# Patient Record
Sex: Male | Born: 1935 | Race: White | Hispanic: No | Marital: Married | Smoking: Never smoker
Health system: Southern US, Community
[De-identification: ages and names within clinical notes are randomized; demographics above are authoritative.]

## PROBLEM LIST (undated history)

## (undated) DIAGNOSIS — I5033 Acute on chronic diastolic (congestive) heart failure: Secondary | ICD-10-CM

## (undated) DIAGNOSIS — IMO0001 Reserved for inherently not codable concepts without codable children: Secondary | ICD-10-CM

## (undated) DIAGNOSIS — C61 Malignant neoplasm of prostate: Secondary | ICD-10-CM

## (undated) DIAGNOSIS — K449 Diaphragmatic hernia without obstruction or gangrene: Secondary | ICD-10-CM

## (undated) DIAGNOSIS — IMO0002 Reserved for concepts with insufficient information to code with codable children: Secondary | ICD-10-CM

## (undated) DIAGNOSIS — I10 Essential (primary) hypertension: Secondary | ICD-10-CM

## (undated) DIAGNOSIS — I350 Nonrheumatic aortic (valve) stenosis: Secondary | ICD-10-CM

## (undated) DIAGNOSIS — I4891 Unspecified atrial fibrillation: Secondary | ICD-10-CM

## (undated) DIAGNOSIS — R32 Unspecified urinary incontinence: Secondary | ICD-10-CM

## (undated) DIAGNOSIS — C799 Secondary malignant neoplasm of unspecified site: Secondary | ICD-10-CM

## (undated) DIAGNOSIS — Z952 Presence of prosthetic heart valve: Secondary | ICD-10-CM

## (undated) DIAGNOSIS — Z923 Personal history of irradiation: Secondary | ICD-10-CM

## (undated) DIAGNOSIS — K222 Esophageal obstruction: Secondary | ICD-10-CM

## (undated) DIAGNOSIS — K5790 Diverticulosis of intestine, part unspecified, without perforation or abscess without bleeding: Secondary | ICD-10-CM

## (undated) DIAGNOSIS — K219 Gastro-esophageal reflux disease without esophagitis: Secondary | ICD-10-CM

## (undated) DIAGNOSIS — I639 Cerebral infarction, unspecified: Secondary | ICD-10-CM

## (undated) DIAGNOSIS — N529 Male erectile dysfunction, unspecified: Secondary | ICD-10-CM

## (undated) DIAGNOSIS — I5032 Chronic diastolic (congestive) heart failure: Secondary | ICD-10-CM

## (undated) DIAGNOSIS — E440 Moderate protein-calorie malnutrition: Secondary | ICD-10-CM

## (undated) DIAGNOSIS — H9190 Unspecified hearing loss, unspecified ear: Secondary | ICD-10-CM

## (undated) DIAGNOSIS — M1990 Unspecified osteoarthritis, unspecified site: Secondary | ICD-10-CM

## (undated) DIAGNOSIS — E785 Hyperlipidemia, unspecified: Secondary | ICD-10-CM

## (undated) DIAGNOSIS — J90 Pleural effusion, not elsewhere classified: Secondary | ICD-10-CM

## (undated) HISTORY — DX: Diaphragmatic hernia without obstruction or gangrene: K44.9

## (undated) HISTORY — DX: Hyperlipidemia, unspecified: E78.5

## (undated) HISTORY — DX: Esophageal obstruction: K22.2

## (undated) HISTORY — DX: Personal history of irradiation: Z92.3

## (undated) HISTORY — DX: Nonrheumatic aortic (valve) stenosis: I35.0

## (undated) HISTORY — DX: Unspecified osteoarthritis, unspecified site: M19.90

## (undated) HISTORY — DX: Diverticulosis of intestine, part unspecified, without perforation or abscess without bleeding: K57.90

---
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
## 2002-02-15 ENCOUNTER — Encounter: Payer: Self-pay | Admitting: Gastroenterology

## 2002-02-15 ENCOUNTER — Encounter (INDEPENDENT_AMBULATORY_CARE_PROVIDER_SITE_OTHER): Payer: Self-pay

## 2002-02-15 ENCOUNTER — Inpatient Hospital Stay (HOSPITAL_COMMUNITY): Admission: EM | Admit: 2002-02-15 | Discharge: 2002-02-17 | Payer: Self-pay | Admitting: Emergency Medicine

## 2002-02-15 ENCOUNTER — Encounter: Payer: Self-pay | Admitting: Internal Medicine

## 2005-12-11 ENCOUNTER — Emergency Department (HOSPITAL_COMMUNITY): Admission: EM | Admit: 2005-12-11 | Discharge: 2005-12-11 | Payer: Self-pay | Admitting: Emergency Medicine

## 2007-01-10 ENCOUNTER — Encounter: Admission: RE | Admit: 2007-01-10 | Discharge: 2007-01-10 | Payer: Self-pay | Admitting: Family Medicine

## 2009-01-29 ENCOUNTER — Encounter (INDEPENDENT_AMBULATORY_CARE_PROVIDER_SITE_OTHER): Payer: Self-pay | Admitting: *Deleted

## 2009-06-05 ENCOUNTER — Ambulatory Visit: Payer: Self-pay

## 2009-06-05 ENCOUNTER — Encounter: Payer: Self-pay | Admitting: Internal Medicine

## 2009-08-10 ENCOUNTER — Ambulatory Visit (HOSPITAL_COMMUNITY): Admission: RE | Admit: 2009-08-10 | Discharge: 2009-08-10 | Payer: Self-pay | Admitting: Urology

## 2009-08-14 ENCOUNTER — Ambulatory Visit: Admission: RE | Admit: 2009-08-14 | Discharge: 2009-11-12 | Payer: Self-pay | Admitting: Radiation Oncology

## 2009-09-11 DIAGNOSIS — C61 Malignant neoplasm of prostate: Secondary | ICD-10-CM

## 2009-09-11 HISTORY — DX: Malignant neoplasm of prostate: C61

## 2009-09-11 HISTORY — PX: OTHER SURGICAL HISTORY: SHX169

## 2009-09-15 ENCOUNTER — Telehealth (INDEPENDENT_AMBULATORY_CARE_PROVIDER_SITE_OTHER): Payer: Self-pay | Admitting: *Deleted

## 2009-09-28 ENCOUNTER — Ambulatory Visit (HOSPITAL_BASED_OUTPATIENT_CLINIC_OR_DEPARTMENT_OTHER): Admission: RE | Admit: 2009-09-28 | Discharge: 2009-09-28 | Payer: Self-pay | Admitting: Urology

## 2011-03-17 LAB — POCT I-STAT, CHEM 8
Calcium, Ion: 1.21 mmol/L (ref 1.12–1.32)
Creatinine, Ser: 1.2 mg/dL (ref 0.4–1.5)
Glucose, Bld: 122 mg/dL — ABNORMAL HIGH (ref 70–99)
Hemoglobin: 15 g/dL (ref 13.0–17.0)
Sodium: 140 mEq/L (ref 135–145)
TCO2: 21 mmol/L (ref 0–100)

## 2011-04-29 NOTE — Discharge Summary (Signed)
[facility]  Patient:    Lucas Green, Lucas Green Visit Number: [id number] MRN: [id number]          Service Type: MED Location: 3W [id number] Attending Physician:  Dolores Patty Dictated by:   Stacie Glaze, M.D. [facility] Admit Date:  02/15/2002 Disc. Date: 02/17/02   CC:         Dr. Dina Rich, Rosalita Levan, [place] at [facility] T. Pleas Koch., M.D. [facility]   Discharge Summary  ADMITTING DIAGNOSIS:  Gastrointestinal bleed.  DISCHARGE DIAGNOSES: 1. Gastrointestinal bleed secondary to esophageal erosions. 2. Esophageal stricture. 3. Hiatal hernia. 4. Chronic esophagitis. 5. Anemia secondary to blood loss.  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  Pain was _______ on February 13, 2002.  Was admitted on the 7th and hemoglobin was found to be 8.6.  He was transfused and appropriate gastroenterology intervention was obtained by Dr. Claudette Head.  Endoscopy revealed esophagitis, ________ reflux, and esophageal stricture which was dilated, and a significant hiatal hernia.  He was begun on Protonix 40 mg p.o. b.i.d. and was continued on his other medications which included aspirin, Niaspan, Lipitor, Norvasc, Mavik, hydrochlorothiazide, and Tums on a p.r.n. basis.  His Zantac will be discontinued and he will be placed on Protonix 40 b.i.d., and he should be placed on iron for recovery of his anemia, 324 mg p.o. b.i.d.  He was stable for the past 48 hours prior to discharge and he is discharged in stable condition.  He is instructed to follow up with Dr. Sol Passer on Monday and obtain a CBC and Dr. Russella Dar as directed in the discharge instructions.  A prescription was given to the patient. Dictated by:   Stacie Glaze, M.D. [facility] Attending Physician:  Dolores Patty DD:  02/17/02 TD:  02/17/02 Job: [id number]

## 2011-04-29 NOTE — H&P (Signed)
[facility]  Patient:    Lucas Green, Lucas Green Visit Number: [id number] MRN: [id number]          Service Type: MED Location: 3W 0353 01 Attending Physician:  Dolores Patty Dictated by:   Titus Dubin. Alwyn Ren, M.D. [facility] Admit Date:  02/15/2002   CC:         Dina Rich, M.D., [place], [place]  [facility], [facility] C. Daleen Squibb, M.D. 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  He began to notice painless melena on February 13, 2002.  On February 14, 2002, he had melena associated with lightheadedness.  This morning he had another melanotic stool and, after standing up, became short of breath.  In the emergency room he was found to have tachycardia.  He denies any abdominal pain, nausea, vomiting, or dyspepsia.  He has had occasional dysphagia.  Other than 325 mg of aspirin taken ______ each night, he denies the use of any NSAIDs.  He does ingest one or two martinis per day. He does not ingest peppermint and does not use tobacco.  He has one cup of coffee per day.  He denies any other caffeine sources.  The family history does include colon cancer in his mother.  His last colonoscopy was seven years ago and was negative.  He consulted Dr. Sol Passer this morning and was advised to take Zantac.  He took four 75 mg pills and ingested Tums.  PAST MEDICAL HISTORY: 1. Menieres at age 65. 2. Tonsillectomy and adenoidectomy.  MEDICATIONS: 1. Lipitor 20 mg daily. 2. Norvasc 2.5 mg daily. 3. Mavik 4 mg each morning. 4. Hydrochlorothiazide 12.5 mg daily.  ALLERGIES:  Although he can drink martinis, he is allergic to BEER and WINE, which cause angioedema.  FAMILY HISTORY:  Other family history includes myocardial infarction in his father at 27.  There is no diabetes or tuberculosis.  REVIEW OF SYSTEMS:  Positive for a  recent respiratory tract infection.  He has had respiratory symptoms over four weeks, initially with purulent sputum.  He self-prescribed a course of Zithromax, with resolution of the purulence.  He denies any fevers, chills, or sweats.  He has been using albuterol as needed, although it is over two years old.  This was prescribed by Dr. Alroy Dust when he was seen for respiratory symptoms several years ago.  He started Allegra this morning.  He also has prostatic hypertrophy with normal PSAs.  He has had no GU symptoms.  He does have some knee arthritis but takes no medication for it.  Occasionally he will have tinnitus.  He denies any headaches.  He denies chest pain.  PHYSICAL EXAMINATION:  GENERAL:  Pleasant, ______ individual.  VITAL SIGNS:  Blood pressure 126/78, O2 saturations 96% on room air, pulse varies from 112-125, respiratory rate is 18.  HEENT:  Arteriolar narrowing is noted.  He has pattern alopecia.  Tympanic membranes are normal.  Dental hygiene is good.  The remainder of the otolaryngologic exam is unremarkable.  NECK:  Thyroid normal to palpation.  Bilateral carotid bruits are noted.  He has no history of such.  He does have a history of grade I systolic murmur which, indeed, is noted.  The question is whether he does have carotid bruits or radiation of murmur.  There is a faint aortic bruit with no aneurysm noted.  ABDOMEN:  Nontender.  No organomegaly or lymphadenopathy.  GENITOURINARY:  Initially deferred.  EXTREMITIES:  Pedal pulses are intact, and he has no edema.  LABORATORY DATA:  Comprehensive metabolic panel is normal with the exception of a BUN of 39.  Creatinine is 0.9, glucose is 114.  Prothrombin time is normal, as is the PTT.  Urinalysis was also negative.  White count 10,500, hematocrit 31.  PLAN:  He will be admitted to telemetry with GI consultation.  He will be kept n.p.o.  He will be typed and crossed for two units.  Protonix will  be initiated.  Hemoglobin and hematocrit will be monitored frequently to rule out progressive bleed.  Blood pressure pills will be held until he is stable. Dictated by:   Titus Dubin. Alwyn Ren, M.D. [facility] Attending Physician:  Dolores Patty DD:  02/16/99 TD:  02/16/02 Job: [id number]

## 2012-02-17 ENCOUNTER — Encounter: Payer: Self-pay | Admitting: Gastroenterology

## 2012-08-07 ENCOUNTER — Other Ambulatory Visit (HOSPITAL_COMMUNITY): Payer: Self-pay | Admitting: Urology

## 2012-08-07 DIAGNOSIS — C61 Malignant neoplasm of prostate: Secondary | ICD-10-CM

## 2012-08-20 ENCOUNTER — Ambulatory Visit (HOSPITAL_COMMUNITY)
Admission: RE | Admit: 2012-08-20 | Discharge: 2012-08-20 | Disposition: A | Discharge status: Home / Self Care | Payer: Medicare Other | Source: Ambulatory Visit | Attending: Urology | Admitting: Urology

## 2012-08-20 ENCOUNTER — Encounter (HOSPITAL_COMMUNITY)
Admission: RE | Admit: 2012-08-20 | Discharge: 2012-08-20 | Disposition: A | Discharge status: Home / Self Care | Payer: Medicare Other | Source: Ambulatory Visit | Attending: Urology | Admitting: Urology

## 2012-08-20 DIAGNOSIS — M1710 Unilateral primary osteoarthritis, unspecified knee: Secondary | ICD-10-CM | POA: Insufficient documentation

## 2012-08-20 DIAGNOSIS — M1610 Unilateral primary osteoarthritis, unspecified hip: Secondary | ICD-10-CM | POA: Insufficient documentation

## 2012-08-20 DIAGNOSIS — C61 Malignant neoplasm of prostate: Secondary | ICD-10-CM | POA: Insufficient documentation

## 2012-08-20 MED ORDER — TECHNETIUM TC 99M MEDRONATE IV KIT
25.0000 | PACK | Freq: Once | INTRAVENOUS | Status: AC | PRN
  Administered 2012-08-20: 25 via INTRAVENOUS

## 2013-03-14 ENCOUNTER — Encounter: Payer: Self-pay | Admitting: Gastroenterology

## 2013-03-29 ENCOUNTER — Inpatient Hospital Stay (HOSPITAL_COMMUNITY): Payer: Medicare Other

## 2013-03-29 ENCOUNTER — Inpatient Hospital Stay (HOSPITAL_COMMUNITY)
Admission: EM | Admit: 2013-03-29 | Discharge: 2013-04-01 | DRG: 065 | Disposition: A | Discharge status: Rehabilitation Facility | Payer: Medicare Other | Attending: Internal Medicine | Admitting: Internal Medicine

## 2013-03-29 ENCOUNTER — Emergency Department (HOSPITAL_COMMUNITY): Payer: Medicare Other

## 2013-03-29 ENCOUNTER — Encounter (HOSPITAL_COMMUNITY): Payer: Self-pay | Admitting: Emergency Medicine

## 2013-03-29 DIAGNOSIS — I509 Heart failure, unspecified: Secondary | ICD-10-CM | POA: Diagnosis present

## 2013-03-29 DIAGNOSIS — I69922 Dysarthria following unspecified cerebrovascular disease: Secondary | ICD-10-CM

## 2013-03-29 DIAGNOSIS — I5032 Chronic diastolic (congestive) heart failure: Secondary | ICD-10-CM | POA: Diagnosis present

## 2013-03-29 DIAGNOSIS — R29898 Other symptoms and signs involving the musculoskeletal system: Secondary | ICD-10-CM | POA: Diagnosis present

## 2013-03-29 DIAGNOSIS — D72829 Elevated white blood cell count, unspecified: Secondary | ICD-10-CM | POA: Diagnosis present

## 2013-03-29 DIAGNOSIS — I359 Nonrheumatic aortic valve disorder, unspecified: Secondary | ICD-10-CM | POA: Diagnosis present

## 2013-03-29 DIAGNOSIS — Z8673 Personal history of transient ischemic attack (TIA), and cerebral infarction without residual deficits: Secondary | ICD-10-CM | POA: Diagnosis present

## 2013-03-29 DIAGNOSIS — I639 Cerebral infarction, unspecified: Secondary | ICD-10-CM

## 2013-03-29 DIAGNOSIS — I6350 Cerebral infarction due to unspecified occlusion or stenosis of unspecified cerebral artery: Principal | ICD-10-CM | POA: Diagnosis present

## 2013-03-29 DIAGNOSIS — I10 Essential (primary) hypertension: Secondary | ICD-10-CM | POA: Diagnosis present

## 2013-03-29 DIAGNOSIS — I6389 Other cerebral infarction: Secondary | ICD-10-CM

## 2013-03-29 DIAGNOSIS — G8190 Hemiplegia, unspecified affecting unspecified side: Secondary | ICD-10-CM | POA: Diagnosis present

## 2013-03-29 DIAGNOSIS — Z8546 Personal history of malignant neoplasm of prostate: Secondary | ICD-10-CM

## 2013-03-29 DIAGNOSIS — E785 Hyperlipidemia, unspecified: Secondary | ICD-10-CM | POA: Diagnosis present

## 2013-03-29 DIAGNOSIS — I63511 Cerebral infarction due to unspecified occlusion or stenosis of right middle cerebral artery: Secondary | ICD-10-CM

## 2013-03-29 DIAGNOSIS — M889 Osteitis deformans of unspecified bone: Secondary | ICD-10-CM | POA: Diagnosis present

## 2013-03-29 DIAGNOSIS — K219 Gastro-esophageal reflux disease without esophagitis: Secondary | ICD-10-CM | POA: Diagnosis present

## 2013-03-29 HISTORY — DX: Cerebral infarction, unspecified: I63.9

## 2013-03-29 HISTORY — DX: Essential (primary) hypertension: I10

## 2013-03-29 HISTORY — DX: Gastro-esophageal reflux disease without esophagitis: K21.9

## 2013-03-29 LAB — COMPREHENSIVE METABOLIC PANEL
AST: 16 U/L (ref 0–37)
Albumin: 3.8 g/dL (ref 3.5–5.2)
Alkaline Phosphatase: 121 U/L — ABNORMAL HIGH (ref 39–117)
BUN: 30 mg/dL — ABNORMAL HIGH (ref 6–23)
CO2: 19 mEq/L (ref 19–32)
Chloride: 109 mEq/L (ref 96–112)
GFR calc non Af Amer: 60 mL/min — ABNORMAL LOW (ref >90)
Potassium: 3.8 mEq/L (ref 3.5–5.1)
Total Bilirubin: 0.4 mg/dL (ref 0.3–1.2)

## 2013-03-29 LAB — CBC WITH DIFFERENTIAL/PLATELET
Basophils Absolute: 0 10*3/uL (ref 0.0–0.1)
Basophils Relative: 0 % (ref 0–1)
HCT: 40.4 % (ref 39.0–52.0)
Hemoglobin: 14 g/dL (ref 13.0–17.0)
Lymphocytes Relative: 12 % (ref 12–46)
MCHC: 34.7 g/dL (ref 30.0–36.0)
Monocytes Absolute: 1.4 10*3/uL — ABNORMAL HIGH (ref 0.1–1.0)
Monocytes Relative: 9 % (ref 3–12)
Neutro Abs: 12.9 10*3/uL — ABNORMAL HIGH (ref 1.7–7.7)
Neutrophils Relative %: 79 % — ABNORMAL HIGH (ref 43–77)
WBC: 16.2 10*3/uL — ABNORMAL HIGH (ref 4.0–10.5)

## 2013-03-29 MED ORDER — HEPARIN SODIUM (PORCINE) 5000 UNIT/ML IJ SOLN
5000.0000 [IU] | Freq: Three times a day (TID) | INTRAMUSCULAR | Status: DC
  Administered 2013-03-29 – 2013-04-01 (×9): 5000 [IU] via SUBCUTANEOUS
  Filled 2013-03-29 (×10): qty 1

## 2013-03-29 MED ORDER — ASPIRIN 325 MG PO TABS
325.0000 mg | ORAL_TABLET | Freq: Every day | ORAL | Status: DC
  Administered 2013-03-29 – 2013-03-30 (×2): 325 mg via ORAL
  Filled 2013-03-29 (×2): qty 1

## 2013-03-29 MED ORDER — ASPIRIN 300 MG RE SUPP
300.0000 mg | Freq: Every day | RECTAL | Status: DC
  Filled 2013-03-29: qty 1

## 2013-03-29 MED ORDER — SODIUM CHLORIDE 0.9 % IV SOLN: INTRAVENOUS | Status: DC

## 2013-03-29 NOTE — ED Notes (Signed)
The pt was transferred as a code stroke from [place] long ed.  The pt has had speech difficulty for 3 days.  Weakness today worse for 30 minutes..  On arrival the stroke team at the bedside 2004.  Pt alert oriented skin warm and dry

## 2013-03-29 NOTE — ED Provider Notes (Signed)
History    Seen on arrival CSN: [id number]  Arrival date & time 03/29/13  1929   First MD Initiated Contact with Patient 03/29/13 1931      Chief Complaint  Patient presents with  . Extremity Weakness   Level V caveat urgent need for intervention (Consider location/radiation/quality/duration/timing/severity/associated sxs/prior treatment) Patient is a 79 y.o. male presenting with extremity weakness.  Extremity Weakness   Developed left-sided arm and leg weakness onset 4 PM today preceded by generalized weakness for approximately 2 days. He also reports some difficulty walking and "weak voice" no treatment prior to coming here Past Medical History  Diagnosis Date  . Cancer   . Hypertension   . GERD (gastroesophageal reflux disease)    aortic stenosis  Prostate cancer Past Surgical History  Procedure Laterality Date  . Colon surgery      History reviewed. No pertinent family history.  History  Substance Use Topics  . Smoking status: Never Smoker   . Smokeless tobacco: Never Used  . Alcohol Use: Yes      Review of Systems  Unable to perform ROS: Acuity of condition  Musculoskeletal: Positive for extremity weakness.  Neurological: Positive for weakness.    Allergies  Review of patient's allergies indicates no known allergies.  Home Medications  No current outpatient prescriptions on file.  BP 160/69  Pulse 94  Temp(Src) 98.7 F (37.1 C) (Oral)  Resp 18  Ht 6\' 2"  (1.88 m)  Wt 202 lb (91.627 kg)  BMI 25.92 kg/m2  SpO2 98%  Physical Exam  Nursing note and vitals reviewed. Constitutional: He is oriented to person, place, and time. He appears well-developed and well-nourished. No distress.  HENT:  Head: Normocephalic and atraumatic.  Eyes: Conjunctivae are normal. Pupils are equal, round, and reactive to light.  Neck: Neck supple. No tracheal deviation present. No thyromegaly present.  Cardiovascular: Normal rate and regular rhythm.   Murmur heard. 3/6  systolic ejection murmur left sternal border  Pulmonary/Chest: Effort normal and breath sounds normal.  Abdominal: Soft. Bowel sounds are normal. He exhibits no distension. There is no tenderness.  Musculoskeletal: Normal range of motion. He exhibits no edema and no tenderness.  Neurological: He is alert and oriented to person, place, and time. He has normal reflexes. No cranial nerve deficit. Coordination normal.  Left upper extremity motor strength 4/5 left lower extremity motion 4/5 right upper extremity motor strength 5 over 5 right lower extremity motor strength 5 over 5. Finger to nose normal heel to shin normal NIH stroke scale equals 2  Skin: Skin is warm and dry. No rash noted.  Psychiatric: He has a normal mood and affect.    ED Course  Procedures (including critical care time)  Labs Reviewed  GLUCOSE, CAPILLARY - Abnormal; Notable for the following:    Glucose-Capillary 117 (*)    All other components within normal limits  CBC WITH DIFFERENTIAL  COMPREHENSIVE METABOLIC PANEL  APTT  PROTIME-INR  TYPE AND SCREEN   No results found.   No diagnosis found.   Date: 03/29/2013  Rate: 90  Rhythm: normal sinus rhythm  QRS Axis: normal  Intervals: normal  ST/T Wave abnormalities: nonspecific T wave changes  Conduction Disutrbances:none  Narrative Interpretation:   Old EKG Reviewed: No change from 09/18/2009 interpreted by me   MDM  Code stroke callled Spoke with patient and patient's wife advised of need for transfer to [facility] Spoke with Dr. Roseanne Reno, neurologist who was transferred to [facility] And  accepts patient in transfer Dr Preston Fleeting EDP at [facility] of patient's Patient will be transported emergently Dx stroke   CRITICAL CARE Performed by: Doug Sou   Total critical care time: 30 minute  Critical care time was exclusive of separately billable procedures and treating other patients.  Critical care was necessary to treat or  prevent imminent or life-threatening deterioration.  Critical care was time spent personally by me on the following activities: development of treatment plan with patient and/or surrogate as well as nursing, discussions with consultants, evaluation of patient's response to treatment, examination of patient, obtaining history from patient or surrogate, ordering and performing treatments and interventions, ordering and review of laboratory studies, ordering and review of radiographic studies, pulse oximetry and re-evaluation of patient's condition.     Doug Sou, MD 03/29/13 [phone]

## 2013-03-29 NOTE — ED Notes (Signed)
The pt is c/o lt sided weakness.  No pain anywhere.  Requesting water.   Swallow screen has to be completed before  He can drink

## 2013-03-29 NOTE — Consult Note (Signed)
Referring Physician: Dr. Rennis Chris    Chief Complaint: Left-sided weakness.  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 He was last known well at 1600 today. He states he's been feeling generally weak for about 3 days. With the onset of his left side weakness today he also had slurred speech. He's noticed slight numbness in his left upper extremity as well. There is no previous history of stroke nor TIA. Patient has been taking aspirin one per day. CT scan of his head showed atherosclerotic changes as well as small vessel ischemic changes but no acute abnormality. NIH stroke score was 3. Speech returned to normal. He had mild residual coordination abnormality and numbness involving his left upper extremity as well as mild residual left lower extremity weakness. He was not deemed a candidate for intravenous thrombolytic therapy with TPA because of the mildness of his residual deficits.  LSN: 1600 on 03/29/2013 tPA Given: No: Mild residual deficits MRankin: 1  Past Medical History  Diagnosis Date  . Cancer   . Hypertension   . GERD (gastroesophageal reflux disease)     History reviewed. No pertinent family history.   Physical Examination: Blood pressure 138/64, pulse 88, temperature 98.7 F (37.1 C), temperature source Oral, resp. rate 18, height 6\' 2"  (1.88 m), weight 91.627 kg (202 lb), SpO2 99.00%.  Neurologic Examination: Mental Status: Alert, oriented, thought content appropriate.  Speech fluent without evidence of aphasia. Able to follow commands without difficulty. Cranial Nerves: II-Visual fields were normal. III/IV/VI-Pupils were equal and reacted. Extraocular movements were full and conjugate.    V/VII-no facial numbness and no facial weakness. VIII-normal. X-normal speech and symmetrical palatal movement. XII-midline tongue extension Motor: Mild residual drift against  gravity of his left lower extremity; motor exam is otherwise normal. Sensory: Reduced tactile sensation involving left upper extremity compared to the right. Deep Tendon Reflexes: 2+ and symmetric. Plantars: Mute bilaterally Cerebellar: Slightly impaired finger-to-nose testing with use of the left upper extremity.  Ct Head Wo Contrast  03/29/2013  *RADIOLOGY REPORT*  Clinical Data: Left-sided weakness, dizziness, confusion  CT HEAD WITHOUT CONTRAST  Technique:  Contiguous axial images were obtained from the base of the skull through the vertex without contrast.  Comparison: None.  Findings: No evidence of parenchymal hemorrhage or extra-axial fluid collection. No mass lesion, mass effect, or midline shift.  No CT evidence of acute infarction.  Subcortical white matter and periventricular small vessel ischemic changes.  Intracranial atherosclerosis.  Mild global cortical atrophy.  No ventriculomegaly.  The visualized paranasal sinuses are essentially clear. The mastoid air cells are unopacified.  No evidence of calvarial fracture.  IMPRESSION: No evidence of acute intracranial abnormality.  Mild atrophy with small vessel ischemic changes and intracranial atherosclerosis.   Original Report Authenticated By: Charline Bills, M.D.     Assessment: 79 y.o. male presenting with probable small vessel right MCA territory ischemic infarction.  Stroke Risk Factors - hypertension  Plan: 1. HgbA1c, fasting lipid panel 2. MRI, MRA  of the brain without contrast 3. PT consult, OT consult, Speech consult 4. Echocardiogram 5. Carotid dopplers 6. Prophylactic therapy-Antiplatelet med: Plavix 75 mg/day 7. Risk factor modification 8. Telemetry monitoring   C.R. Roseanne Reno, MD [facility] [phone]  03/29/2013, 8:29 PM

## 2013-03-29 NOTE — ED Notes (Signed)
Pt c/o left side weakness. MD at bedside

## 2013-03-29 NOTE — ED Provider Notes (Signed)
MSE:  Patient transferred from was a long hospitalization code stroke. Code stroke canceled by attending neurologist. He still has left-sided weakness and will be admitted to the medicine service. Case is discussed with Dr. Julian Reil of [facility] who agrees to admit the patient.  Dione Booze, MD 03/29/13 2038

## 2013-03-29 NOTE — ED Notes (Signed)
The pt passed his swallow screen ?

## 2013-03-29 NOTE — H&P (Signed)
[facility] History and Physical  Lucas Green ZOX:[id number] DOB: Oct 03, 1932 DOA: 03/29/2013  Referring physician: ED PCP: Dina Rich, MD  Specialists: None  Chief Complaint: LUE weakness, slurred speech  HPI: Lucas Green is a 79 y.o. male who presents with c/o L sided arm and leg weakness and dysarthria, preceded by generalized weakness for approximately 2 days.  Was seen by PCP "who gave him a Z.pak for mycoplasma found in his urine".  Patient developed L sided arm weakness and aphasia at 4pm today and so went into the hospital at the urging of his wife.  In the ED at [facility], code stroke was called and [facility].  At [facility] Dr. Roseanne Reno evaluated patient and made the decision not to give TPA, hospitalist has been asked to admit.  Review of Systems: 12 systems reviewed and otherwise negative.  Past Medical History  Diagnosis Date  . Cancer   . Hypertension   . GERD (gastroesophageal reflux disease)    Past Surgical History  Procedure Laterality Date  . Colon surgery     Social History:  reports that he has never smoked. He has never used smokeless tobacco. He reports that  drinks alcohol. He reports that he does not use illicit drugs.   No Known Allergies  History reviewed. No pertinent family history.  Prior to Admission medications   Not on File   Physical Exam: Filed Vitals:   03/29/13 2019 03/29/13 2030 03/29/13 2035 03/29/13 2045  BP: 138/64 128/57 128/57 147/65  Pulse: 88 73 84 77  Temp:      TempSrc:      Resp: 18 13 18 18   Height:      Weight:      SpO2: 99% 98% 98% 98%    General:  NAD, resting comfortably in bed Eyes: PEERLA EOMI ENT: mucous membranes moist Neck: supple w/o JVD Cardiovascular: RRR w/o MRG Respiratory: CTA B Abdomen: soft, nt, nd, bs+ Skin: no rash nor lesion Musculoskeletal: MAE, full ROM all 4 extremities Psychiatric: normal tone and affect Neurologic: AAOx3, no facial droop, 4/5 LUE strength in biceps,  and grip, 5/5 strength throughout otherwise, wife notes speech seems to be improving, tongue protrusion seems midline.  Labs on Admission:  Basic Metabolic Panel:  Recent Labs Lab 03/29/13 1930  NA 143  K 3.8  CL 109  CO2 19  GLUCOSE 127*  BUN 30*  CREATININE 1.14  CALCIUM 10.0   Liver Function Tests:  Recent Labs Lab 03/29/13 1930  AST 16  ALT 12  ALKPHOS 121*  BILITOT 0.4  PROT 7.5  ALBUMIN 3.8   No results found for this basename: LIPASE, AMYLASE,  in the last 168 hours No results found for this basename: AMMONIA,  in the last 168 hours CBC:  Recent Labs Lab 03/29/13 1930  WBC 16.2*  NEUTROABS 12.9*  HGB 14.0  HCT 40.4  MCV 91.2  PLT 323   Cardiac Enzymes: No results found for this basename: CKTOTAL, CKMB, CKMBINDEX, TROPONINI,  in the last 168 hours  BNP (last 3 results) No results found for this basename: PROBNP,  in the last 8760 hours CBG:  Recent Labs Lab 03/29/13 1939  GLUCAP 117*    Radiological Exams on Admission: Ct Head Wo Contrast  03/29/2013  *RADIOLOGY REPORT*  Clinical Data: Left-sided weakness, dizziness, confusion  CT HEAD WITHOUT CONTRAST  Technique:  Contiguous axial images were obtained from the base of the skull through the vertex without contrast.  Comparison: None.  Findings: No  evidence of parenchymal hemorrhage or extra-axial fluid collection. No mass lesion, mass effect, or midline shift.  No CT evidence of acute infarction.  Subcortical white matter and periventricular small vessel ischemic changes.  Intracranial atherosclerosis.  Mild global cortical atrophy.  No ventriculomegaly.  The visualized paranasal sinuses are essentially clear. The mastoid air cells are unopacified.  No evidence of calvarial fracture.  IMPRESSION: No evidence of acute intracranial abnormality.  Mild atrophy with small vessel ischemic changes and intracranial atherosclerosis.   Original Report Authenticated By: Charline Bills, M.D.     EKG:  Independently reviewed.  Assessment/Plan Principal Problem:   Acute ischemic right MCA stroke Active Problems:   Hypertension   1. Stroke - symptoms most consistent with R MCA territory, MRI is pending to evaluate this of course, admitting patient on stroke pathway, MRI, carotid dopplers, 2d echo all pending, tele monitor, states he takes a baby ASA at home a day right now so going ahead and bumping this up to 325 until neurology makes recs. 2. HTN - dont see where he is on any HTN meds at home (actually dont see any home meds in med rec), but will hold BP meds if he is for permissive HTN.  Dr. Roseanne Reno has seen patient in ED.  Code Status: Full Code (must indicate code status--if unknown or must be presumed, indicate so) Family Communication: Full code (indicate person spoken with, if applicable, with phone number if by telephone) Disposition Plan: Admit to inpatient (indicate anticipated LOS)  Time spent: 70 min  Jatia Musa M. [facility] Pager [phone]  If 7PM-7AM, please contact night-coverage [email or web address] Password [facility] 03/29/2013, 9:07 PM

## 2013-03-29 NOTE — ED Notes (Signed)
Dr Roseanne Reno has  Decided not to give tpa.

## 2013-03-29 NOTE — ED Notes (Signed)
Stroke  Screen 2 per dr Roseanne Reno

## 2013-03-29 NOTE — ED Notes (Signed)
nsr on the monitor 

## 2013-03-30 DIAGNOSIS — I359 Nonrheumatic aortic valve disorder, unspecified: Secondary | ICD-10-CM

## 2013-03-30 DIAGNOSIS — I6350 Cerebral infarction due to unspecified occlusion or stenosis of unspecified cerebral artery: Secondary | ICD-10-CM

## 2013-03-30 LAB — ABO/RH: ABO/RH(D): A NEG

## 2013-03-30 LAB — HEMOGLOBIN A1C
Hgb A1c MFr Bld: 5.8 % — ABNORMAL HIGH (ref <5.7)
Mean Plasma Glucose: 120 mg/dL — ABNORMAL HIGH (ref <117)

## 2013-03-30 LAB — TYPE AND SCREEN
ABO/RH(D): A NEG
Antibody Screen: NEGATIVE

## 2013-03-30 LAB — LIPID PANEL
LDL Cholesterol: 171 mg/dL — ABNORMAL HIGH (ref 0–99)
Total CHOL/HDL Ratio: 4.5 RATIO

## 2013-03-30 MED ORDER — SIMVASTATIN 20 MG PO TABS
20.0000 mg | ORAL_TABLET | Freq: Every day | ORAL | Status: DC
  Administered 2013-03-30 – 2013-04-01 (×3): 20 mg via ORAL
  Filled 2013-03-30 (×4): qty 1

## 2013-03-30 MED ORDER — CLOPIDOGREL BISULFATE 75 MG PO TABS
75.0000 mg | ORAL_TABLET | Freq: Every day | ORAL | Status: DC
  Administered 2013-03-31 – 2013-04-01 (×2): 75 mg via ORAL
  Filled 2013-03-30 (×3): qty 1

## 2013-03-30 NOTE — Progress Notes (Signed)
*  PRELIMINARY RESULTS* Vascular Ultrasound Carotid Duplex (Doppler) has been completed.   There is no obvious evidence of hemodynamically significant internal carotid artery stenosis >40%. Vertebral arteries are patent with antegrade flow.  03/30/2013 3:02 PM Gertie Fey, RDMS, RDCS

## 2013-03-30 NOTE — Progress Notes (Signed)
[facility] PROGRESS NOTE  Lucas Green QIH:[id number] DOB: 26-Aug-1932 DOA: 03/29/2013 PCP: Dina Rich, MD  Assessment/Plan: 1. Brainstem infarct- Changed to plavix 75 mg po daily,  neurology is following. 2D echo and carotid doppler are pending. 2. Hyperlipidemia- LDL is 171, continue with zocor 3. Hypertension- BP is stable  Code Status: presumed full code Family Communication: Spoke to patient Disposition Plan: To be determined   Consultants:  Neurology  Procedures:  2 d echo, carotid dopplers  Antibiotics:  None  HPI/Subjective: Patient seen and examined, admitted with dysarthria and hemiparesis. MRI shows acute brainstem infarct  Objective: Filed Vitals:   03/30/13 0622 03/30/13 0814 03/30/13 1016 03/30/13 1304  BP: 139/60 130/52 135/53 130/59  Pulse: 57 60 64 67  Temp: 97.5 F (36.4 C) 97.4 F (36.3 C) 97.7 F (36.5 C) 97.3 F (36.3 C)  TempSrc: Oral Oral Oral Oral  Resp: 18 18 18 18   Height:      Weight:      SpO2: 98% 98% 98% 98%    Intake/Output Summary (Last 24 hours) at 03/30/13 1350 Last data filed at 03/30/13 0414  Gross per 24 hour  Intake      0 ml  Output    325 ml  Net   -325 ml   Filed Weights   03/29/13 1932 03/29/13 2220  Weight: 91.627 kg (202 lb) 90.266 kg (199 lb)    Exam:   General:  Appear in no acute distress  Cardiovascular: s1s2 RRR  Respiratory: Clear bilaterally  Abdomen:  Soft, nontender  Extremities: No edema   Data Reviewed: Basic Metabolic Panel:  Recent Labs Lab 03/29/13 1930  NA 143  K 3.8  CL 109  CO2 19  GLUCOSE 127*  BUN 30*  CREATININE 1.14  CALCIUM 10.0   Liver Function Tests:  Recent Labs Lab 03/29/13 1930  AST 16  ALT 12  ALKPHOS 121*  BILITOT 0.4  PROT 7.5  ALBUMIN 3.8   CBC:  Recent Labs Lab 03/29/13 1930  WBC 16.2*  NEUTROABS 12.9*  HGB 14.0  HCT 40.4  MCV 91.2  PLT 323   Cardiac Enzymes: No results found for this basename: CKTOTAL, CKMB, CKMBINDEX,  TROPONINI,  in the last 168 hours BNP (last 3 results) No results found for this basename: PROBNP,  in the last 8760 hours CBG:  Recent Labs Lab 03/29/13 1939  GLUCAP 117*    No results found for this or any previous visit (from the past 240 hour(s)).   Studies: Ct Head Wo Contrast  03/29/2013  *RADIOLOGY REPORT*  Clinical Data: Left-sided weakness, dizziness, confusion  CT HEAD WITHOUT CONTRAST  Technique:  Contiguous axial images were obtained from the base of the skull through the vertex without contrast.  Comparison: None.  Findings: No evidence of parenchymal hemorrhage or extra-axial fluid collection. No mass lesion, mass effect, or midline shift.  No CT evidence of acute infarction.  Subcortical white matter and periventricular small vessel ischemic changes.  Intracranial atherosclerosis.  Mild global cortical atrophy.  No ventriculomegaly.  The visualized paranasal sinuses are essentially clear. The mastoid air cells are unopacified.  No evidence of calvarial fracture.  IMPRESSION: No evidence of acute intracranial abnormality.  Mild atrophy with small vessel ischemic changes and intracranial atherosclerosis.   Original Report Authenticated By: Charline Bills, M.D.    Mr Angiogram Head Wo Contrast  03/29/2013  *RADIOLOGY REPORT*  Clinical Data:  79 year old male with left side extremity weakness and dysarthria.  Generalized weakness.  Dizziness confusion. History  of prostate cancer.  Comparison: CT head without contrast 03/29/2013.  MRI HEAD WITHOUT CONTRAST  Technique: Multiplanar, multiecho pulse sequences of the brain and surrounding structures were obtained according to standard protocol without intravenous contrast.  Findings: Confluent 8 x 15 linear area of restricted diffusion in the right paracentral pons corresponding to an acute infarct of the basilar perforator artery.  Mild associated T2 and FLAIR hyperintensity.  No mass effect or hemorrhage.  No other diffusion abnormality.  Major intracranial vascular flow voids are preserved except for the distal most right vertebral artery.  See MRA findings below.  No midline shift, ventriculomegaly, mass effect, evidence of mass lesion, extra-axial collection or acute intracranial hemorrhage. Cervicomedullary junction and pituitary are within normal limits. Outside of the brain stem, gray and white matter signal is largely within normal limits for age.  There is right greater than left basal ganglia at T2 heterogeneity, but this may be entirely related to dilated perivascular spaces.  Visualized orbit soft tissues are within normal limits.  Fluid signal in the right mastoids.  Negative visualized nasopharynx except for trace retained secretions.  Other Visualized paranasal sinuses and mastoids are clear.  Negative scalp soft tissues.  The C2 vertebra is chronically enlarged and has heterogeneous marrow.  There is some preserved internal intrinsic T1 marrow signal.  Marrow signal in the skull appears normal.  There appears to be congenital degree of cervical spinal stenosis.  IMPRESSION: 1.  Acute right brainstem infarct:  basilar perforator artery territory in the right paracentral pons.  No mass effect or hemorrhage. 2. See MRA findings below. 3. Abnormal C2 vertebra, but most consistent with Paget's disease (benign).  This was evaluated with radiographs and bone scan on 08/10/2009.  MRA HEAD WITHOUT CONTRAST  Technique: Angiographic images of the Circle of Willis were obtained using MRA technique without  intravenous contrast.  Findings: There is antegrade flow in the posterior circulation. The distal right vertebral artery is nondominant and it is occluded just beyond the right PICA origin.  However, the right PICA remains patent, and I note that the above MRI was negative for any signal changes in the right cerebellum.  The distal left vertebral artery is dominant and supplies the basilar.  There is evidence of a mild proximal fenestration of  the basilar artery.  AICA origins are patent.  There is mild basilar artery tortuosity.  No hemodynamically significant basilar artery stenosis.  SCA and PCA origins are within normal limits.  Right posterior communicating arteries present, the left is diminutive or absent.  Bilateral PCA branches are mildly irregular, but patent without significant stenosis.  Antegrade flow in both ICA siphons.  Ophthalmic and right posterior communicating artery origins are within normal limits.  Evidence of calcified ICA atherosclerosis, but no hemodynamically significant stenosis identified.  Carotid termini are patent.  MCA and ACA origins are within normal limits.  Diminutive or absent anterior communicating artery.  Visualized ACA branches are within normal limits.  Visualized bilateral MCA branches are within normal limits.  IMPRESSION: 1.  Nondominant distal right vertebral artery is occluded just beyond the right PICA origin.  However, this is of doubtful acute significance as the dominant left vertebral artery that primarily supplies the basilar is patent without stenosis. 2.  No significant basilar artery stenosis.  No focal basilar artery lesion to correspond to the acute MRI findings. 3.  Evidence of ICA and PCA atherosclerosis without hemodynamically significant stenosis. 4.  Anterior circulation otherwise is negative.  Study discussed by telephone  with Dr. Dione Booze on 03/29/2013 at 2235 hours.   Original Report Authenticated By: Erskine Speed, M.D.    Mr Brain Wo Contrast  03/29/2013  *RADIOLOGY REPORT*  Clinical Data:  79 year old male with left side extremity weakness and dysarthria.  Generalized weakness.  Dizziness confusion. History of prostate cancer.  Comparison: CT head without contrast 03/29/2013.  MRI HEAD WITHOUT CONTRAST  Technique: Multiplanar, multiecho pulse sequences of the brain and surrounding structures were obtained according to standard protocol without intravenous contrast.  Findings:  Confluent 8 x 15 linear area of restricted diffusion in the right paracentral pons corresponding to an acute infarct of the basilar perforator artery.  Mild associated T2 and FLAIR hyperintensity.  No mass effect or hemorrhage.  No other diffusion abnormality. Major intracranial vascular flow voids are preserved except for the distal most right vertebral artery.  See MRA findings below.  No midline shift, ventriculomegaly, mass effect, evidence of mass lesion, extra-axial collection or acute intracranial hemorrhage. Cervicomedullary junction and pituitary are within normal limits. Outside of the brain stem, gray and white matter signal is largely within normal limits for age.  There is right greater than left basal ganglia at T2 heterogeneity, but this may be entirely related to dilated perivascular spaces.  Visualized orbit soft tissues are within normal limits.  Fluid signal in the right mastoids.  Negative visualized nasopharynx except for trace retained secretions.  Other Visualized paranasal sinuses and mastoids are clear.  Negative scalp soft tissues.  The C2 vertebra is chronically enlarged and has heterogeneous marrow.  There is some preserved internal intrinsic T1 marrow signal.  Marrow signal in the skull appears normal.  There appears to be congenital degree of cervical spinal stenosis.  IMPRESSION: 1.  Acute right brainstem infarct:  basilar perforator artery territory in the right paracentral pons.  No mass effect or hemorrhage. 2. See MRA findings below. 3. Abnormal C2 vertebra, but most consistent with Paget's disease (benign).  This was evaluated with radiographs and bone scan on 08/10/2009.  MRA HEAD WITHOUT CONTRAST  Technique: Angiographic images of the Circle of Willis were obtained using MRA technique without  intravenous contrast.  Findings: There is antegrade flow in the posterior circulation. The distal right vertebral artery is nondominant and it is occluded just beyond the right PICA  origin.  However, the right PICA remains patent, and I note that the above MRI was negative for any signal changes in the right cerebellum.  The distal left vertebral artery is dominant and supplies the basilar.  There is evidence of a mild proximal fenestration of the basilar artery.  AICA origins are patent.  There is mild basilar artery tortuosity.  No hemodynamically significant basilar artery stenosis.  SCA and PCA origins are within normal limits.  Right posterior communicating arteries present, the left is diminutive or absent.  Bilateral PCA branches are mildly irregular, but patent without significant stenosis.  Antegrade flow in both ICA siphons.  Ophthalmic and right posterior communicating artery origins are within normal limits.  Evidence of calcified ICA atherosclerosis, but no hemodynamically significant stenosis identified.  Carotid termini are patent.  MCA and ACA origins are within normal limits.  Diminutive or absent anterior communicating artery.  Visualized ACA branches are within normal limits.  Visualized bilateral MCA branches are within normal limits.  IMPRESSION: 1.  Nondominant distal right vertebral artery is occluded just beyond the right PICA origin.  However, this is of doubtful acute significance as the dominant left vertebral artery that primarily  supplies the basilar is patent without stenosis. 2.  No significant basilar artery stenosis.  No focal basilar artery lesion to correspond to the acute MRI findings. 3.  Evidence of ICA and PCA atherosclerosis without hemodynamically significant stenosis. 4.  Anterior circulation otherwise is negative.  Study discussed by telephone with Dr. Dione Booze on 03/29/2013 at 2235 hours.   Original Report Authenticated By: Erskine Speed, M.D.     Scheduled Meds: . [START ON 03/31/2013] clopidogrel  75 mg Oral Q breakfast  . heparin  5,000 Units Subcutaneous Q8H  . simvastatin  20 mg Oral q1800   Continuous Infusions: . sodium chloride       Principal Problem:   Acute ischemic right MCA stroke Active Problems:   Hypertension    Time spent: 25 min    [facility]  [facility] Pager [phone]. If 7PM-7AM, please contact night-coverage at [email or web address], password [facility] 03/30/2013, 1:50 PM  LOS: 1 day

## 2013-03-30 NOTE — Evaluation (Signed)
Physical Therapy Evaluation Patient Details Name: Lucas Green MRN: [id number] DOB: May 20, 1932 Today's Date: 03/30/2013 Time: 4098-1191 PT Time Calculation (min): 31 min  PT Assessment / Plan / Recommendation Clinical Impression  79 y/o male adm. left sided weakness and slurred speech. MRI (+) for right brainstem infarct: basilar perforator artery territory in the right paracentral pons. 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 Presents to therapy today with below impairments affecting PLOF and independence. Will benefit physical therapy in the acute setting to maximize functional independence and facilitate safe d/c plan.      PT Assessment  Patient needs continued PT services    Follow Up Recommendations  CIR    Does the patient have the potential to tolerate intense rehabilitation      Barriers to Discharge Decreased caregiver support wife works    Equipment Recommendations  None recommended by PT    Recommendations for Other Services     Frequency Min 4X/week    Precautions / Restrictions Precautions Precautions: Fall Restrictions Weight Bearing Restrictions: No         Mobility  Bed Mobility Bed Mobility: Supine to Sit;Sitting - Scoot to Edge of Bed Supine to Sit: 4: Min guard;With rails Sitting - Scoot to Edge of Bed: 4: Min guard Details for Bed Mobility Assistance: Pt sitting up on left side of bed (toward weaker side) with great effort and increased time. Min guard for safety. Transfers Sit to Stand: 4: Min assist;From bed;From chair/3-in-1;With upper extremity assist;With armrests Stand to Sit: 4: Min guard;To chair/3-in-1;To elevated surface;With upper extremity assist Details for Transfer Assistance: Assist for power up due to difficulty with lower surfaces (arthritic knees at baseline). Ambulation/Gait Ambulation/Gait Assistance: 3: Mod assist Ambulation Distance (Feet): 100 Feet Assistive device: 1 person hand held  assist Ambulation/Gait Assistance Details: mod truncal stability assist during weight shift and stepping, cues for tall posture and forward gaze Gait Pattern: Decreased stride length;Step-through pattern;Trunk flexed General Gait Details: decreased step height, decreased control with LLE Stairs: No    Exercises     PT Diagnosis: Difficulty walking;Abnormality of gait;Generalized weakness;Hemiplegia non-dominant side  PT Problem List: Decreased activity tolerance;Decreased mobility;Decreased balance;Decreased knowledge of use of DME PT Treatment Interventions: Stair training;Functional mobility training;Gait training;Therapeutic activities;Therapeutic exercise;Balance training;Neuromuscular re-education;Patient/family education   PT Goals Acute Rehab PT Goals PT Goal Formulation: With patient Time For Goal Achievement: 04/06/13 Potential to Achieve Goals: Good Pt will go Supine/Side to Sit: Independently PT Goal: Supine/Side to Sit - Progress: Goal set today Pt will go Sit to Supine/Side: Independently PT Goal: Sit to Supine/Side - Progress: Goal set today Pt will go Sit to Stand: Independently PT Goal: Sit to Stand - Progress: Goal set today Pt will go Stand to Sit: Independently PT Goal: Stand to Sit - Progress: Goal set today Pt will Stand: Independently;3 - 5 min;with no upper extremity support PT Goal: Stand - Progress: Goal set today Pt will Ambulate: >150 feet;with modified independence;with least restrictive assistive device PT Goal: Ambulate - Progress: Goal set today Pt will Perform Home Exercise Program: Independently PT Goal: Perform Home Exercise Program - Progress: Goal set today  Visit Information  Last PT Received On: 03/30/13 Assistance Needed: +1    Subjective Data  Subjective: I am weak. Patient Stated Goal: back to work    Prior Functioning  Home Living Lives With: Spouse Available Help at Discharge: Family;Available PRN/intermittently Type of Home:  House Home Access: Stairs to enter Entrance Stairs-Number of Steps: 1 Entrance Stairs-Rails: None  Home Layout: One level Bathroom Shower/Tub: Writer: None Prior Function Level of Independence: Independent Able to Take Stairs?: Yes Driving: Yes Vocation:  (works 4 days a week) Comments: physician in addiction medicine Communication Communication: Expressive difficulties Dominant Hand: Right    Cognition  Cognition Arousal/Alertness: Awake/alert Behavior During Therapy: WFL for tasks assessed/performed Overall Cognitive Status: Within Functional Limits for tasks assessed    Extremity/Trunk Assessment Right Upper Extremity Assessment RUE ROM/Strength/Tone: [facility] for tasks assessed;Deficits RUE ROM/Strength/Tone Deficits: Arthritic shoulder with limited ROM at baseline (~100 flexion).  4/5 throughout. RUE Sensation: WFL - Light Touch;WFL - Proprioception RUE Coordination: WFL - gross/fine motor Left Upper Extremity Assessment LUE ROM/Strength/Tone: WFL for tasks assessed (4/5 throughout) LUE Sensation: WFL - Light Touch;WFL - Proprioception LUE Coordination: Deficits LUE Coordination Deficits: Decreased accuracy and speed with fine motor.  Slightly decreased accuracy with finger to nose test. Right Lower Extremity Assessment RLE ROM/Strength/Tone: WFL for tasks assessed RLE Sensation: WFL - Light Touch;WFL - Proprioception RLE Coordination: WFL - gross/fine motor Left Lower Extremity Assessment LLE ROM/Strength/Tone: Deficits LLE ROM/Strength/Tone Deficits: grossly 4/5 LLE Sensation: WFL - Light Touch LLE Coordination: Deficits LLE Coordination Deficits: decreased control functionally during gait (almost ataxic in nature)   Balance Balance Balance Assessed: Yes Static Sitting Balance Static Sitting - Balance Support: Feet supported Static Sitting - Level of Assistance: 5: Stand by assistance Static Sitting -  Comment/# of Minutes: Stand by assist due to pt c/o dizziness.  Dynamic Sitting Balance Dynamic Sitting - Balance Support: Feet supported;During functional activity Dynamic Sitting - Level of Assistance: 5: Stand by assistance Static Standing Balance Static Standing - Balance Support: No upper extremity supported Static Standing - Level of Assistance: 5: Stand by assistance;4: Min assist Static Standing - Comment/# of Minutes: Close guarding for safety. Pt not putting full weight through LLE and stands with left knee flexed.  When cued to distribute weight equally through LEs, pt reports he feels like he is putting too much weight through LLE and feels like he will fall. Given the area of his stroke I question if he is having central vestibular deficits (c/o dizziness and the room being off, denied spinning) Dynamic Standing Balance Dynamic Standing - Balance Support: No upper extremity supported Dynamic Standing - Level of Assistance: 4: Min assist Dynamic Standing - Balance Activities: Lateral lean/weight shifting Dynamic Standing - Comments: Assist for steadying and guiding hips during weight shifting.  Pt fearful of falling and hesitant to perform dynamic standing.  End of Session PT - End of Session Equipment Utilized During Treatment: Gait belt Activity Tolerance: Patient tolerated treatment well Patient left: in chair;with call bell/phone within reach Nurse Communication: Mobility status  GP     [facility] HELEN 03/30/2013, 1:33 PM

## 2013-03-30 NOTE — Evaluation (Signed)
Speech Language Pathology Evaluation Patient Details Name: Lucas Green MRN: [id number] DOB: 10/10/1932 Today's Date: 03/30/2013 Time: 4098-1191 SLP Time Calculation (min): 34 min  Problem List:  Patient Active Problem List  Diagnosis  . Hypertension  . Acute ischemic right MCA stroke   Past Medical History:  Past Medical History  Diagnosis Date  . Cancer   . Hypertension   . GERD (gastroesophageal reflux disease)    Past Surgical History:  Past Surgical History  Procedure Laterality Date  . 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 He was last known well at 1600 on 03/29/13. He stated he'd been feeling generally weak for about 3 days prior to admission. With the onset of his left side weakness on 03/29/2013 he also had slurred speech. He noticed slight numbness in his left upper extremity as well. There is no previous history of stroke nor TIA. Patient had been taking aspirin one per day. CT scan of his head showed atherosclerotic changes as well as small vessel ischemic changes but no acute abnormality. NIH stroke score was 3. Speech returned to normal. He had mild residual coordination abnormality and numbness involving his left upper extremity as well as mild residual left lower extremity weakness.  Cognitive -Linguistic evaluation indicated per stroke protocol.   Assessment / Plan / Recommendation Clinical Impression  Cognitive Linguistic Evaluation completed.  No evidence of aphasia but presents with minimal dysarthria. Patient aware of speech errors.  Connected speech intelligible but errors noticeable during structured reading task.  ST to sign off as cognitive skills functional in acute care setting. No cognitive deficits noted during evaluation in structured environment but patient may benefit from Speech consult at next level of care to further assess  executive function and complex cognitive skills to ensure safe return to employment.      SLP Assessment  All further Speech Lanaguage Pathology  needs can be addressed in the next venue of care    Follow Up Recommendations  Inpatient Rehab           SLP Evaluation Prior Functioning  Cognitive/Linguistic Baseline: Within functional limits Type of Home: House Lives With: Spouse Available Help at Discharge: Available PRN/intermittently Vocation: Part time employment   Cognition  Overall Cognitive Status: Within Functional Limits for tasks assessed Arousal/Alertness: Awake/alert Orientation Level: Oriented X4 Attention: Focused Focused Attention: Appears intact Memory: Appears intact Awareness: Appears intact Problem Solving: Appears intact Safety/Judgment: Appears intact    Comprehension  Auditory Comprehension Overall Auditory Comprehension: Appears within functional limits for tasks assessed Visual Recognition/Discrimination Discrimination: Within Function Limits Reading Comprehension Reading Status: Within funtional limits    Expression Expression Primary Mode of Expression: Verbal Verbal Expression Overall Verbal Expression: Appears within functional limits for tasks assessed   Oral / Motor Oral Motor/Sensory Function Overall Oral Motor/Sensory Function: Appears within functional limits for tasks assessed Motor Speech Overall Motor Speech: Appears within functional limits for tasks assessed   GO    Moreen Fowler MS, CCC-SLP [phone] [facility] 03/30/2013, 2:32 PM

## 2013-03-30 NOTE — Code Documentation (Signed)
Patient arrived to [facility] ED, complaining of left sided weakness and slurred speech. Code stroke called at [facility] ED at 1941, patient arrived to [facility] at [place], EDP exam at [facility] at 1931, Stroke team available at [facility] at 1950, patient LSW at 1830, patient arrived to [facility] at [facility] at 1945, phlebotomist arrived at 40 at [facility], CT read by Dr. Roseanne Reno at [phone]. Patient transported via [facility] to [facility] ED and arrived at 2003. Initial NIH 3, code stroke cancelled by Dr. Roseanne Reno at 2020.

## 2013-03-30 NOTE — Progress Notes (Signed)
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 He was last known well at 1600 on 03/29/13. He stated he'd been feeling generally weak for about 3 days prior to admission. With the onset of his left side weakness on 03/29/2013 he also had slurred speech. He noticed slight numbness in his left upper extremity as well. There is no previous history of stroke nor TIA. Patient had been taking aspirin one per day. CT scan of his head showed atherosclerotic changes as well as small vessel ischemic changes but no acute abnormality. NIH stroke score was 3. Speech returned to normal. He had mild residual coordination abnormality and numbness involving his left upper extremity as well as mild residual left lower extremity weakness.   He was not deemed a candidate for intravenous thrombolytic therapy with TPA because of the mildness of his residual deficits.   LSN: 1600 on 03/29/2013  tPA Given: No: Mild residual deficits  MRankin: 1  SUBJECTIVE There are no family members currently in a room.Marland Kitchen He seems to have very mild dysarthria at this time.  OBJECTIVE Most recent Vital Signs: Filed Vitals:   03/30/13 0232 03/30/13 0413 03/30/13 0622 03/30/13 0814  BP: 139/56 142/60 139/60 130/52  Pulse: 62 57 57 60  Temp: 97.4 F (36.3 C) 97.3 F (36.3 C) 97.5 F (36.4 C) 97.4 F (36.3 C)  TempSrc: Oral Oral Oral Oral  Resp: 18 20 18 18   Height:      Weight:      SpO2: 100% 99% 98% 98%   CBG (last 3)   Recent Labs  03/29/13 1939  GLUCAP 117*    IV Fluid Intake:   . sodium chloride      MEDICATIONS  . aspirin  300 mg Rectal Daily   Or  . aspirin  325 mg Oral Daily  . heparin  5,000 Units Subcutaneous Q8H   PRN:    Diet:  Cardiac with thin liquids. Activity:  Bedrest with bathroom privileges. DVT Prophylaxis:  Subcutaneous heparin.  CLINICALLY SIGNIFICANT  STUDIES Basic Metabolic Panel:  Recent Labs Lab 03/29/13 1930  NA 143  K 3.8  CL 109  CO2 19  GLUCOSE 127*  BUN 30*  CREATININE 1.14  CALCIUM 10.0   Liver Function Tests:  Recent Labs Lab 03/29/13 1930  AST 16  ALT 12  ALKPHOS 121*  BILITOT 0.4  PROT 7.5  ALBUMIN 3.8   CBC:  Recent Labs Lab 03/29/13 1930  WBC 16.2*  NEUTROABS 12.9*  HGB 14.0  HCT 40.4  MCV 91.2  PLT 323   Coagulation:  Recent Labs Lab 03/29/13 1930  LABPROT 13.6  INR 1.05   Cardiac Enzymes: No results found for this basename: CKTOTAL, CKMB, CKMBINDEX, TROPONINI,  in the last 168 hours Urinalysis: No results found for this basename: COLORURINE, APPERANCEUR, LABSPEC, PHURINE, GLUCOSEU, HGBUR, BILIRUBINUR, KETONESUR, PROTEINUR, UROBILINOGEN, NITRITE, LEUKOCYTESUR,  in the last 168 hours Lipid Panel    Component Value Date/Time   CHOL 246* 03/30/2013 0715   TRIG 101 03/30/2013 0715   HDL 55 03/30/2013 0715   CHOLHDL 4.5 03/30/2013 0715   VLDL 20 03/30/2013 0715   LDLCALC 171* 03/30/2013 0715   HgbA1C  No results found for this basename: HGBA1C    Urine Drug Screen:   No results found for this basename: labopia, cocainscrnur, labbenz, amphetmu, thcu, labbarb    Alcohol Level: No results found  for this basename: ETH,  in the last 168 hours  Ct Head Wo Contrast 03/29/13 IMPRESSION: No evidence of acute intracranial abnormality.  Mild atrophy with small vessel ischemic changes and intracranial atherosclerosis.     MRI HEAD WITHOUT CONTRAST   03/29/13 IMPRESSION  Acute right brainstem infarct:  basilar perforator artery territory in the right paracentral pons.  No mass effect or hemorrhage.  Abnormal C2 vertebra, but most consistent with Paget's disease (benign).  This was evaluated with radiographs and bone scan on 08/10/2009.   MRA HEAD WITHOUT CONTRAST    IMPRESSION: 1.  Nondominant distal right vertebral artery is occluded just beyond the right PICA origin.  However, this is of doubtful  acute significance as the dominant left vertebral artery that primarily supplies the basilar is patent without stenosis. 2.  No significant basilar artery stenosis.  No focal basilar artery lesion to correspond to the acute MRI findings. 3.  Evidence of ICA and PCA atherosclerosis without hemodynamically significant stenosis. 4.  Anterior circulation otherwise is negative.      2D Echocardiogram  pending  Carotid Doppler  pending  CXR    EKG sinus rhythm rate 89 beats per minute.  Therapy Recommendations pending  Physical Exam   General - pleasant 79 year old male in bed in no acute distress. Heart - Regular rate and rhythm - harsh grade 2/6 systolic murmur. Lungs - Clear to auscultation Abdomen - Soft - non tender Extremities - Distal pulses intact - trace edema Skin - Warm and dry  NEUROLOGIC:   MENTAL STATUS: awake, alert, oriented, language fluent,  follows simple commands.  CRANIAL NERVES: pupils equal and reactive to light,extraocular muscles intact, facial sensation and strength symmetric, uvula midlinec, tongue midline MOTOR: normal bulk and tone, Strength -very mild if any weakness of the left upper and lower extremities. SENSORY: normal and symmetric to light touch  COORDINATION: finger-nose-finger - heel to shin with very mild difficulty on the left. Deep tendon reflexes - 2+ and symmetric  ASSESSMENT Mr. Lucas Green is a 79 y.o. male presenting with left hemiparesis and dysarthria. TPA not given secondary to the mildness of the patient's symptoms.  Imaging confirms  an acute brainstem infarct. Infarct felt to be ischemic, cause likely small vessel diease. On aspirin 81 mg orally every day prior to admission. Now on aspirin 325 mg orally every day for secondary stroke prevention. Patient with resultant very mild dysarthria and mild discoordination of the left extremities. Work up underway.   Hypertension history  History of aortic  stenosis  Leukocytosis  Hyperlipidemia cholesterol 246   LDL 171   Hospital day # 1  TREATMENT/PLAN  Will change to plavix 75mg /day for antiplatelet therapy.   Await therapists evaluations.  2-D echo, hemoglobin A1c, and carotid Dopplers pending.  Agree with simvastatin.   Delton See PA-C [facility] Pager [phone] 03/30/2013, 9:26 AM  I have personally obtained a history, examined the patient, evaluated imaging results, and formulated the assessment and plan of care. I agree with the above.79 yo M with likely small vessel disease infarct 2/2 htn and hpl. Risk factor modifcation.   Ritta Slot, MD [facility] [phone]  If 7pm- 7am, please page neurology on call at [phone].

## 2013-03-30 NOTE — Evaluation (Signed)
Occupational Therapy Evaluation Patient Details Name: Lucas Green MRN: [id number] DOB: [birth year] Today's Date: 03/30/2013 Time: 4403-4742 OT Time Calculation (min): 36 min  OT Assessment / Plan / Recommendation Clinical Impression  Pt admitted with left side weakness and slurred speech. MRI positive for right brainstem infarct: basilar perforator artery territory in the right paracentral pons.  Will continue to follow acutely to address below problem list.  Recommending CIR to further maximize rehab as pt's ultimate goal is to be independent and return to work.    OT Assessment  Patient needs continued OT Services    Follow Up Recommendations  CIR    Barriers to Discharge      Equipment Recommendations   (TBD)    Recommendations for Other Services Rehab consult  Frequency  Min 3X/week    Precautions / Restrictions Precautions Precautions: Fall Restrictions Weight Bearing Restrictions: No   Pertinent Vitals/Pain See vitals    ADL  Eating/Feeding: Performed;Independent Where Assessed - Eating/Feeding: Chair Upper Body Dressing: Performed;Set up Where Assessed - Upper Body Dressing: Unsupported sitting Lower Body Dressing: Performed;Minimal assistance Where Assessed - Lower Body Dressing: Unsupported sitting Toilet Transfer: Simulated;Moderate assistance Toilet Transfer Method: Sit to Barista:  (bed) Equipment Used: Gait belt Transfers/Ambulation Related to ADLs: Mod assist for ambulation with and HHA.  Pt pushing heavily through RUE. ADL Comments: Pt fearful of falling during all standing tasks. Although he reports that his LUE feels weaker, pt not limited functionally and even able to tie gown.    OT Diagnosis: Generalized weakness;Paresis  OT Problem List: Decreased strength;Decreased activity tolerance;Impaired balance (sitting and/or standing);Decreased coordination;Decreased knowledge of use of DME or AE;Impaired UE functional use OT  Treatment Interventions: Self-care/ADL training;Neuromuscular education;DME and/or AE instruction;Therapeutic activities;Patient/family education;Balance training   OT Goals Acute Rehab OT Goals OT Goal Formulation: With patient Time For Goal Achievement: 04/13/13 Potential to Achieve Goals: Good ADL Goals Pt Will Perform Grooming: with modified independence;Standing at sink ADL Goal: Grooming - Progress: Goal set today Pt Will Perform Upper Body Dressing: with modified independence;Sitting, chair;Sitting, bed;Unsupported ADL Goal: Upper Body Dressing - Progress: Goal set today Pt Will Perform Lower Body Dressing: with modified independence;Sit to stand from chair;Sit to stand from bed;Unsupported ADL Goal: Lower Body Dressing - Progress: Goal set today Pt Will Transfer to Toilet: with modified independence;Ambulation;Regular height toilet ADL Goal: Toilet Transfer - Progress: Goal set today Pt Will Perform Toileting - Clothing Manipulation: with modified independence;Standing ADL Goal: Toileting - Clothing Manipulation - Progress: Goal set today Miscellaneous OT Goals Miscellaneous OT Goal #1: Pt will perform dynamic standing balance task >5 min at mod I level as precursor for ADLs. OT Goal: Miscellaneous Goal #1 - Progress: Goal set today Miscellaneous OT Goal #2: Pt will perform LUE fine motor HEP 2x daily. OT Goal: Miscellaneous Goal #2 - Progress: Goal set today  Visit Information  Last OT Received On: 03/30/13 Assistance Needed: +1 PT/OT Co-Evaluation/Treatment: Yes    Subjective Data      Prior Functioning               Vision/Perception     Cognition  Cognition Arousal/Alertness: Awake/alert Behavior During Therapy: WFL for tasks assessed/performed Overall Cognitive Status: Within Functional Limits for tasks assessed    Extremity/Trunk Assessment Right Upper Extremity Assessment RUE ROM/Strength/Tone: [facility] for tasks assessed;Deficits RUE ROM/Strength/Tone  Deficits: Arthritic shoulder with limited ROM at baseline (~100 flexion).  4/5 throughout. RUE Sensation: WFL - Light Touch;WFL - Proprioception RUE Coordination: WFL - gross/fine motor  Left Upper Extremity Assessment LUE ROM/Strength/Tone: WFL for tasks assessed (4/5 throughout) LUE Sensation: WFL - Light Touch;WFL - Proprioception LUE Coordination: Deficits LUE Coordination Deficits: Decreased accuracy and speed with fine motor.  Slightly decreased accuracy with finger to nose test. Right Lower Extremity Assessment RLE ROM/Strength/Tone: WFL for tasks assessed RLE Sensation: WFL - Light Touch;WFL - Proprioception RLE Coordination: WFL - gross/fine motor Left Lower Extremity Assessment LLE ROM/Strength/Tone: Deficits LLE ROM/Strength/Tone Deficits: grossly 4/5 LLE Sensation: WFL - Light Touch LLE Coordination: Deficits LLE Coordination Deficits: decreased control functionally during gait (almost ataxic in nature)     Mobility Bed Mobility Bed Mobility: Supine to Sit;Sitting - Scoot to Edge of Bed Supine to Sit: 4: Min guard;With rails Sitting - Scoot to Edge of Bed: 4: Min guard Details for Bed Mobility Assistance: Pt sitting up on left side of bed (toward weaker side) with great effort and increased time. Min guard for safety. Transfers Transfers: Sit to Stand;Stand to Sit Sit to Stand: 4: Min assist;From bed;From chair/3-in-1;With upper extremity assist;With armrests Stand to Sit: 4: Min guard;To chair/3-in-1;To elevated surface;With upper extremity assist Details for Transfer Assistance: Assist for power up due to difficulty with lower surfaces (arthritic knees at baseline).     Exercise     Balance Balance Balance Assessed: Yes Static Sitting Balance Static Sitting - Balance Support: Feet supported Static Sitting - Level of Assistance: 5: Stand by assistance Static Sitting - Comment/# of Minutes: Stand by assist due to pt c/o dizziness.  Dynamic Sitting Balance Dynamic  Sitting - Balance Support: Feet supported;During functional activity Dynamic Sitting - Level of Assistance: 5: Stand by assistance Static Standing Balance Static Standing - Balance Support: No upper extremity supported Static Standing - Level of Assistance: 5: Stand by assistance;4: Min assist Static Standing - Comment/# of Minutes: Close guarding for safety. Pt not putting full weight through LLE and stands with left knee flexed.  When cued to distribute weight equally through LEs, pt reports he feels like he is putting too much weight through LLE and feels like he will fall. Given the area of his stroke I question if he is having central equilibrium deficits Dynamic Standing Balance Dynamic Standing - Balance Support: No upper extremity supported Dynamic Standing - Level of Assistance: 4: Min assist Dynamic Standing - Balance Activities: Lateral lean/weight shifting Dynamic Standing - Comments: Assist for steadying and guiding hips during weight shifting.  Pt fearful of falling and hesitant to perform dynamic standing.   End of Session OT - End of Session Equipment Utilized During Treatment: Gait belt Activity Tolerance: Patient tolerated treatment well Patient left: in chair;with call bell/phone within reach Nurse Communication: Mobility status  GO    03/30/2013 Cipriano Mile OTR/L Pager [phone] Office [phone]  Cipriano Mile 03/30/2013, 2:01 PM

## 2013-03-30 NOTE — Progress Notes (Signed)
  Echocardiogram 2D Echocardiogram has been performed.  Lucas Green 03/30/2013, 5:27 PM

## 2013-03-30 NOTE — Progress Notes (Signed)
Nutrition Brief Note  Patient identified on the Malnutrition Screening Tool (MST) Report for unsure of any recent weight loss.  Patient admitted with stroke. Passed swallow screen on admission.  Body mass index is 25.54 kg/(m^2). Patient meets criteria for normal weight based on current BMI.   Current diet order is Heart Healthy, patient is tolerating well per discussion with RN. Labs and medications reviewed.   No nutrition interventions warranted at this time. If nutrition issues arise, please consult RD.   Joaquin Courts, RD, LDN, CNSC Pager [phone] After Hours Pager [phone]

## 2013-03-31 LAB — CBC
Hemoglobin: 13.6 g/dL (ref 13.0–17.0)
MCH: 31.9 pg (ref 26.0–34.0)
MCHC: 36 g/dL (ref 30.0–36.0)
MCV: 88.7 fL (ref 78.0–100.0)
RBC: 4.26 MIL/uL (ref 4.22–5.81)

## 2013-03-31 MED ORDER — METOPROLOL SUCCINATE ER 25 MG PO TB24
25.0000 mg | ORAL_TABLET | Freq: Every day | ORAL | Status: DC
  Administered 2013-03-31: 25 mg via ORAL
  Filled 2013-03-31 (×3): qty 1

## 2013-03-31 NOTE — Progress Notes (Signed)
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 He was last known well at 1600 on 03/29/13. He stated he'd been feeling generally weak for about 3 days prior to admission. With the onset of his left side weakness on 03/29/2013 he also had slurred speech. He noticed slight numbness in his left upper extremity as well. There is no previous history of stroke nor TIA. Patient had been taking aspirin one per day. CT scan of his head showed atherosclerotic changes as well as small vessel ischemic changes but no acute abnormality. NIH stroke score was 3. Speech returned to normal. He had mild residual coordination abnormality and numbness involving his left upper extremity as well as mild residual left lower extremity weakness.   He was not deemed a candidate for intravenous thrombolytic therapy with TPA because of the mildness of his residual deficits.   LSN: 1600 on 03/29/2013  tPA Given: No: Mild residual deficits  MRankin: 1  SUBJECTIVE There are no family members currently in a room. He remains mildly dysarthric. He had questions this morning regarding his blood pressure. He states he was on antihypertensive medications prior to admission but is not receiving them now. I explained that we do not want his blood pressure running low following his stroke. A review of his blood pressures indicate that they are in an appropriate range at this time. We will continue to monitor his blood pressure for indications that his antihypertensive medications should be resumed.   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 He is anxious to return to work.  OBJECTIVE Most recent Vital Signs: Filed Vitals:   03/30/13 1731 03/30/13 2157 03/31/13 0206 03/31/13 0622  BP: 121/59 143/56 143/64 140/56  Pulse: 73 66 61 60  Temp: 97.7 F  (36.5 C) 98.6 F (37 C) 98.2 F (36.8 C) 97.6 F (36.4 C)  TempSrc: Oral Oral Oral Oral  Resp: 18 16 16 16   Height:      Weight:      SpO2: 97% 97% 97% 98%   CBG (last 3)   Recent Labs  03/29/13 1939  GLUCAP 117*    IV Fluid Intake:   . sodium chloride      MEDICATIONS  . clopidogrel  75 mg Oral Q breakfast  . heparin  5,000 Units Subcutaneous Q8H  . simvastatin  20 mg Oral q1800   PRN:    Diet:  Cardiac with thin liquids. Activity:  Bedrest with bathroom privileges. DVT Prophylaxis:  Subcutaneous heparin.  CLINICALLY SIGNIFICANT STUDIES Basic Metabolic Panel:   Recent Labs Lab 03/29/13 1930  NA 143  K 3.8  CL 109  CO2 19  GLUCOSE 127*  BUN 30*  CREATININE 1.14  CALCIUM 10.0   Liver Function Tests:   Recent Labs Lab 03/29/13 1930  AST 16  ALT 12  ALKPHOS 121*  BILITOT 0.4  PROT 7.5  ALBUMIN 3.8   CBC:   Recent Labs Lab 03/29/13 1930  WBC 16.2*  NEUTROABS 12.9*  HGB 14.0  HCT 40.4  MCV 91.2  PLT 323   Coagulation:   Recent Labs Lab 03/29/13 1930  LABPROT 13.6  INR 1.05   Cardiac Enzymes: No results found for this basename: CKTOTAL, CKMB, CKMBINDEX, TROPONINI,  in the last 168 hours Urinalysis: No results found for this basename: COLORURINE, APPERANCEUR, LABSPEC, PHURINE, GLUCOSEU,  HGBUR, BILIRUBINUR, KETONESUR, PROTEINUR, UROBILINOGEN, NITRITE, LEUKOCYTESUR,  in the last 168 hours Lipid Panel    Component Value Date/Time   CHOL 246* 03/30/2013 0715   TRIG 101 03/30/2013 0715   HDL 55 03/30/2013 0715   CHOLHDL 4.5 03/30/2013 0715   VLDL 20 03/30/2013 0715   LDLCALC 171* 03/30/2013 0715   HgbA1C  Lab Results  Component Value Date   HGBA1C 5.8* 03/30/2013    Urine Drug Screen:   No results found for this basename: labopia,  cocainscrnur,  labbenz,  amphetmu,  thcu,  labbarb    Alcohol Level: No results found for this basename: ETH,  in the last 168 hours  Ct Head Wo Contrast 03/29/13 IMPRESSION: No evidence of acute  intracranial abnormality.  Mild atrophy with small vessel ischemic changes and intracranial atherosclerosis.     MRI HEAD WITHOUT CONTRAST   03/29/13 IMPRESSION  Acute right brainstem infarct:  basilar perforator artery territory in the right paracentral pons.  No mass effect or hemorrhage.  Abnormal C2 vertebra, but most consistent with Paget's disease (benign).  This was evaluated with radiographs and bone scan on 08/10/2009.   MRA HEAD WITHOUT CONTRAST    IMPRESSION: 1.  Nondominant distal right vertebral artery is occluded just beyond the right PICA origin.  However, this is of doubtful acute significance as the dominant left vertebral artery that primarily supplies the basilar is patent without stenosis. 2.  No significant basilar artery stenosis.  No focal basilar artery lesion to correspond to the acute MRI findings. 3.  Evidence of ICA and PCA atherosclerosis without hemodynamically significant stenosis. 4.  Anterior circulation otherwise is negative.      2D Echocardiogram  pending  Carotid Doppler  There is no obvious evidence of hemodynamically significant internal carotid artery stenosis >40%. Vertebral arteries are patent with antegrade flow.  CXR    EKG sinus rhythm rate 89 beats per minute.  Therapy Recommendations inpatient rehabilitation recommended.  Physical Exam   General - pleasant 79 year old male in bed in no acute distress. Heart - Regular rate and rhythm - harsh grade 2/6 systolic murmur. Lungs - Clear to auscultation Abdomen - Soft - non tender Extremities - Distal pulses intact - trace edema Skin - Warm and dry  NEUROLOGIC:   MENTAL STATUS: awake, alert, oriented, language fluent,  follows simple commands.  CRANIAL NERVES: pupils equal and reactive to light,extraocular muscles intact, facial sensation and strength symmetric, uvula midlinec, tongue midline MOTOR: normal bulk and tone, Strength -very mild weakness of the left upper and lower  extremities. SENSORY: normal and symmetric to light touch  COORDINATION: finger-nose-finger - heel to shin with very mild difficulty on the left. Deep tendon reflexes - 2+ and symmetric  ASSESSMENT Mr. Reichen Hutzler is a 79 y.o. male presenting with left hemiparesis and dysarthria. TPA not given secondary to the mildness of the patient's symptoms.  Imaging confirms  an acute brainstem infarct. Infarct felt to be ischemic, cause likely small vessel diease. On aspirin 81 mg orally every day prior to admission. Now on Plavix 75 mg daily for secondary stroke prevention. Patient with resultant very mild dysarthria and mild discoordination and hemiparesis of the left extremities. Work up underway.   Hypertension history  History of aortic stenosis  Leukocytosis  Hyperlipidemia cholesterol 246   LDL 171 - statin added.  Hospital day # 2  TREATMENT/PLAN  Will change to plavix 75mg /day for antiplatelet therapy.   Inpatient rehabilitation screening has been ordered.  2-D echo with no  embolic source  Agree with simvastatin.   The patient was on metoprolol 25 mg daily as well as Exforge 10/320 prior to admission. Discussed with Dr. Amada Jupiter. Will resume metoprolol at this time and resume Exforge at a later date.  The patient states that he receives Firmagon injections once each month from his urologist for a history of prostate cancer. This medication has a less than 1% chance of being associated with strokes. Dr. Amada Jupiter suggested that the patient discussed this with his urologist prior to resuming treatment.  Delton See PA-C [facility] Pager [phone] 03/31/2013, 9:12 AM  I have personally obtained a history, examined the patient, evaluated imaging results, and formulated the assessment and plan of care. I agree with the above. Therapies recommending CIR. Likely small vessel etiology pursuing risk factor modification. Changed ASA to plavix for secondary  prevention. Started statin.   Can restart anti-hypertension regimine at this time, though would avoid precipitous drops. Add metoprolol today, could add other agents tomorrow.   No further recommendations from neurological perspective. Neurology will sign off.    Ritta Slot, MD [facility] [phone]  If 7pm- 7am, please page neurology on call at [phone].

## 2013-03-31 NOTE — Progress Notes (Signed)
[facility] PROGRESS NOTE  Quinnten Calvin WUJ:[id number] DOB: June 12, 1932 DOA: 03/29/2013 PCP: Dina Rich, MD  Assessment/Plan: 1. Brainstem infarct- Changed to plavix 75 mg po daily,  neurology is following. 2D echo and carotid doppler are negative 2. Hyperlipidemia- LDL is 171, continue with zocor 3. Hypertension- Metoprolol is restarted  4. Leukocytosis; ? Cause, stress induced. Will check CBC today. 5. DVT prophylaxis: Heparin  Code Status: presumed full code Family Communication: Spoke to patient Disposition Plan: To be determined   Consultants:  Neurology  Procedures:  2 d echo, carotid dopplers  Antibiotics:  None  HPI/Subjective: Patient seen and examined, admitted with dysarthria and hemiparesis. MRI shows acute brainstem infarct.  Patient feels better today, speech better.  Objective: Filed Vitals:   03/31/13 0206 03/31/13 0622 03/31/13 0948 03/31/13 1407  BP: 143/64 140/56 132/60 146/67  Pulse: 61 60 71 91  Temp: 98.2 F (36.8 C) 97.6 F (36.4 C) 98.1 F (36.7 C) 98.6 F (37 C)  TempSrc: Oral Oral Oral Oral  Resp: 16 16 16 18   Height:      Weight:      SpO2: 97% 98% 99% 100%    Intake/Output Summary (Last 24 hours) at 03/31/13 1621 Last data filed at 03/31/13 1409  Gross per 24 hour  Intake    240 ml  Output    927 ml  Net   -687 ml   Filed Weights   03/29/13 1932 03/29/13 2220  Weight: 91.627 kg (202 lb) 90.266 kg (199 lb)    Exam:   General:  Appear in no acute distress  Cardiovascular: s1s2 RRR  Respiratory: Clear bilaterally  Abdomen:  Soft, nontender  Extremities: No edema   Neuro : AOx3, CN 2-12 grossly intact  Data Reviewed: Basic Metabolic Panel:  Recent Labs Lab 03/29/13 1930  NA 143  K 3.8  CL 109  CO2 19  GLUCOSE 127*  BUN 30*  CREATININE 1.14  CALCIUM 10.0   Liver Function Tests:  Recent Labs Lab 03/29/13 1930  AST 16  ALT 12  ALKPHOS 121*  BILITOT 0.4  PROT 7.5  ALBUMIN 3.8    CBC:  Recent Labs Lab 03/29/13 1930  WBC 16.2*  NEUTROABS 12.9*  HGB 14.0  HCT 40.4  MCV 91.2  PLT 323   Cardiac Enzymes: No results found for this basename: CKTOTAL, CKMB, CKMBINDEX, TROPONINI,  in the last 168 hours BNP (last 3 results) No results found for this basename: PROBNP,  in the last 8760 hours CBG:  Recent Labs Lab 03/29/13 1939  GLUCAP 117*    No results found for this or any previous visit (from the past 240 hour(s)).   Studies: Ct Head Wo Contrast  03/29/2013  *RADIOLOGY REPORT*  Clinical Data: Left-sided weakness, dizziness, confusion  CT HEAD WITHOUT CONTRAST  Technique:  Contiguous axial images were obtained from the base of the skull through the vertex without contrast.  Comparison: None.  Findings: No evidence of parenchymal hemorrhage or extra-axial fluid collection. No mass lesion, mass effect, or midline shift.  No CT evidence of acute infarction.  Subcortical white matter and periventricular small vessel ischemic changes.  Intracranial atherosclerosis.  Mild global cortical atrophy.  No ventriculomegaly.  The visualized paranasal sinuses are essentially clear. The mastoid air cells are unopacified.  No evidence of calvarial fracture.  IMPRESSION: No evidence of acute intracranial abnormality.  Mild atrophy with small vessel ischemic changes and intracranial atherosclerosis.   Original Report Authenticated By: Charline Bills, M.D.    Mr Angiogram  Head Wo Contrast  03/29/2013  *RADIOLOGY REPORT*  Clinical Data:  79 year old male with left side extremity weakness and dysarthria.  Generalized weakness.  Dizziness confusion. History of prostate cancer.  Comparison: CT head without contrast 03/29/2013.  MRI HEAD WITHOUT CONTRAST  Technique: Multiplanar, multiecho pulse sequences of the brain and surrounding structures were obtained according to standard protocol without intravenous contrast.  Findings: Confluent 8 x 15 linear area of restricted diffusion in the  right paracentral pons corresponding to an acute infarct of the basilar perforator artery.  Mild associated T2 and FLAIR hyperintensity.  No mass effect or hemorrhage.  No other diffusion abnormality. Major intracranial vascular flow voids are preserved except for the distal most right vertebral artery.  See MRA findings below.  No midline shift, ventriculomegaly, mass effect, evidence of mass lesion, extra-axial collection or acute intracranial hemorrhage. Cervicomedullary junction and pituitary are within normal limits. Outside of the brain stem, gray and white matter signal is largely within normal limits for age.  There is right greater than left basal ganglia at T2 heterogeneity, but this may be entirely related to dilated perivascular spaces.  Visualized orbit soft tissues are within normal limits.  Fluid signal in the right mastoids.  Negative visualized nasopharynx except for trace retained secretions.  Other Visualized paranasal sinuses and mastoids are clear.  Negative scalp soft tissues.  The C2 vertebra is chronically enlarged and has heterogeneous marrow.  There is some preserved internal intrinsic T1 marrow signal.  Marrow signal in the skull appears normal.  There appears to be congenital degree of cervical spinal stenosis.  IMPRESSION: 1.  Acute right brainstem infarct:  basilar perforator artery territory in the right paracentral pons.  No mass effect or hemorrhage. 2. See MRA findings below. 3. Abnormal C2 vertebra, but most consistent with Paget's disease (benign).  This was evaluated with radiographs and bone scan on 08/10/2009.  MRA HEAD WITHOUT CONTRAST  Technique: Angiographic images of the Circle of Willis were obtained using MRA technique without  intravenous contrast.  Findings: There is antegrade flow in the posterior circulation. The distal right vertebral artery is nondominant and it is occluded just beyond the right PICA origin.  However, the right PICA remains patent, and I note that  the above MRI was negative for any signal changes in the right cerebellum.  The distal left vertebral artery is dominant and supplies the basilar.  There is evidence of a mild proximal fenestration of the basilar artery.  AICA origins are patent.  There is mild basilar artery tortuosity.  No hemodynamically significant basilar artery stenosis.  SCA and PCA origins are within normal limits.  Right posterior communicating arteries present, the left is diminutive or absent.  Bilateral PCA branches are mildly irregular, but patent without significant stenosis.  Antegrade flow in both ICA siphons.  Ophthalmic and right posterior communicating artery origins are within normal limits.  Evidence of calcified ICA atherosclerosis, but no hemodynamically significant stenosis identified.  Carotid termini are patent.  MCA and ACA origins are within normal limits.  Diminutive or absent anterior communicating artery.  Visualized ACA branches are within normal limits.  Visualized bilateral MCA branches are within normal limits.  IMPRESSION: 1.  Nondominant distal right vertebral artery is occluded just beyond the right PICA origin.  However, this is of doubtful acute significance as the dominant left vertebral artery that primarily supplies the basilar is patent without stenosis. 2.  No significant basilar artery stenosis.  No focal basilar artery lesion to correspond to  the acute MRI findings. 3.  Evidence of ICA and PCA atherosclerosis without hemodynamically significant stenosis. 4.  Anterior circulation otherwise is negative.  Study discussed by telephone with Dr. Dione Booze on 03/29/2013 at 2235 hours.   Original Report Authenticated By: Erskine Speed, M.D.    Mr Brain Wo Contrast  03/29/2013  *RADIOLOGY REPORT*  Clinical Data:  79 year old male with left side extremity weakness and dysarthria.  Generalized weakness.  Dizziness confusion. History of prostate cancer.  Comparison: CT head without contrast 03/29/2013.  MRI HEAD  WITHOUT CONTRAST  Technique: Multiplanar, multiecho pulse sequences of the brain and surrounding structures were obtained according to standard protocol without intravenous contrast.  Findings: Confluent 8 x 15 linear area of restricted diffusion in the right paracentral pons corresponding to an acute infarct of the basilar perforator artery.  Mild associated T2 and FLAIR hyperintensity.  No mass effect or hemorrhage.  No other diffusion abnormality. Major intracranial vascular flow voids are preserved except for the distal most right vertebral artery.  See MRA findings below.  No midline shift, ventriculomegaly, mass effect, evidence of mass lesion, extra-axial collection or acute intracranial hemorrhage. Cervicomedullary junction and pituitary are within normal limits. Outside of the brain stem, gray and white matter signal is largely within normal limits for age.  There is right greater than left basal ganglia at T2 heterogeneity, but this may be entirely related to dilated perivascular spaces.  Visualized orbit soft tissues are within normal limits.  Fluid signal in the right mastoids.  Negative visualized nasopharynx except for trace retained secretions.  Other Visualized paranasal sinuses and mastoids are clear.  Negative scalp soft tissues.  The C2 vertebra is chronically enlarged and has heterogeneous marrow.  There is some preserved internal intrinsic T1 marrow signal.  Marrow signal in the skull appears normal.  There appears to be congenital degree of cervical spinal stenosis.  IMPRESSION: 1.  Acute right brainstem infarct:  basilar perforator artery territory in the right paracentral pons.  No mass effect or hemorrhage. 2. See MRA findings below. 3. Abnormal C2 vertebra, but most consistent with Paget's disease (benign).  This was evaluated with radiographs and bone scan on 08/10/2009.  MRA HEAD WITHOUT CONTRAST  Technique: Angiographic images of the Circle of Willis were obtained using MRA technique  without  intravenous contrast.  Findings: There is antegrade flow in the posterior circulation. The distal right vertebral artery is nondominant and it is occluded just beyond the right PICA origin.  However, the right PICA remains patent, and I note that the above MRI was negative for any signal changes in the right cerebellum.  The distal left vertebral artery is dominant and supplies the basilar.  There is evidence of a mild proximal fenestration of the basilar artery.  AICA origins are patent.  There is mild basilar artery tortuosity.  No hemodynamically significant basilar artery stenosis.  SCA and PCA origins are within normal limits.  Right posterior communicating arteries present, the left is diminutive or absent.  Bilateral PCA branches are mildly irregular, but patent without significant stenosis.  Antegrade flow in both ICA siphons.  Ophthalmic and right posterior communicating artery origins are within normal limits.  Evidence of calcified ICA atherosclerosis, but no hemodynamically significant stenosis identified.  Carotid termini are patent.  MCA and ACA origins are within normal limits.  Diminutive or absent anterior communicating artery.  Visualized ACA branches are within normal limits.  Visualized bilateral MCA branches are within normal limits.  IMPRESSION: 1.  Nondominant  distal right vertebral artery is occluded just beyond the right PICA origin.  However, this is of doubtful acute significance as the dominant left vertebral artery that primarily supplies the basilar is patent without stenosis. 2.  No significant basilar artery stenosis.  No focal basilar artery lesion to correspond to the acute MRI findings. 3.  Evidence of ICA and PCA atherosclerosis without hemodynamically significant stenosis. 4.  Anterior circulation otherwise is negative.  Study discussed by telephone with Dr. Dione Booze on 03/29/2013 at 2235 hours.   Original Report Authenticated By: Erskine Speed, M.D.     Scheduled  Meds: . clopidogrel  75 mg Oral Q breakfast  . heparin  5,000 Units Subcutaneous Q8H  . metoprolol succinate  25 mg Oral Daily  . simvastatin  20 mg Oral q1800   Continuous Infusions: . sodium chloride      Principal Problem:   Acute ischemic right MCA stroke Active Problems:   Hypertension    Time spent: 25 min    [facility]  [facility] Pager [phone]. If 7PM-7AM, please contact night-coverage at [email or web address], password [facility] 03/31/2013, 4:21 PM  LOS: 2 days

## 2013-04-01 ENCOUNTER — Inpatient Hospital Stay (HOSPITAL_COMMUNITY)
Admission: RE | Admit: 2013-04-01 | Discharge: 2013-04-12 | DRG: 945 | Disposition: A | Discharge status: Home / Self Care | Payer: Medicare Other | Source: Intra-hospital | Attending: Physical Medicine & Rehabilitation | Admitting: Physical Medicine & Rehabilitation

## 2013-04-01 ENCOUNTER — Encounter (HOSPITAL_COMMUNITY): Payer: Self-pay | Admitting: Physical Medicine and Rehabilitation

## 2013-04-01 DIAGNOSIS — E785 Hyperlipidemia, unspecified: Secondary | ICD-10-CM | POA: Diagnosis present

## 2013-04-01 DIAGNOSIS — I6329 Cerebral infarction due to unspecified occlusion or stenosis of other precerebral arteries: Secondary | ICD-10-CM | POA: Diagnosis present

## 2013-04-01 DIAGNOSIS — Z5189 Encounter for other specified aftercare: Principal | ICD-10-CM

## 2013-04-01 DIAGNOSIS — R32 Unspecified urinary incontinence: Secondary | ICD-10-CM

## 2013-04-01 DIAGNOSIS — D72829 Elevated white blood cell count, unspecified: Secondary | ICD-10-CM | POA: Diagnosis present

## 2013-04-01 DIAGNOSIS — K219 Gastro-esophageal reflux disease without esophagitis: Secondary | ICD-10-CM | POA: Diagnosis present

## 2013-04-01 DIAGNOSIS — N3941 Urge incontinence: Secondary | ICD-10-CM | POA: Diagnosis present

## 2013-04-01 DIAGNOSIS — I10 Essential (primary) hypertension: Secondary | ICD-10-CM

## 2013-04-01 DIAGNOSIS — N289 Disorder of kidney and ureter, unspecified: Secondary | ICD-10-CM | POA: Diagnosis not present

## 2013-04-01 DIAGNOSIS — N319 Neuromuscular dysfunction of bladder, unspecified: Secondary | ICD-10-CM | POA: Diagnosis present

## 2013-04-01 DIAGNOSIS — R29898 Other symptoms and signs involving the musculoskeletal system: Secondary | ICD-10-CM | POA: Diagnosis present

## 2013-04-01 DIAGNOSIS — C61 Malignant neoplasm of prostate: Secondary | ICD-10-CM

## 2013-04-01 DIAGNOSIS — I6330 Cerebral infarction due to thrombosis of unspecified cerebral artery: Secondary | ICD-10-CM

## 2013-04-01 DIAGNOSIS — I6359 Cerebral infarction due to unspecified occlusion or stenosis of other cerebral artery: Secondary | ICD-10-CM | POA: Diagnosis present

## 2013-04-01 DIAGNOSIS — Z8673 Personal history of transient ischemic attack (TIA), and cerebral infarction without residual deficits: Secondary | ICD-10-CM | POA: Diagnosis present

## 2013-04-01 DIAGNOSIS — Z8546 Personal history of malignant neoplasm of prostate: Secondary | ICD-10-CM

## 2013-04-01 MED ORDER — METOPROLOL SUCCINATE ER 25 MG PO TB24
25.0000 mg | ORAL_TABLET | Freq: Every day | ORAL | Status: DC
  Administered 2013-04-02 – 2013-04-12 (×11): 25 mg via ORAL
  Filled 2013-04-01 (×13): qty 1

## 2013-04-01 MED ORDER — POLYETHYLENE GLYCOL 3350 17 G PO PACK
17.0000 g | PACK | Freq: Every day | ORAL | Status: DC | PRN
  Filled 2013-04-01: qty 1

## 2013-04-01 MED ORDER — TRAZODONE HCL 50 MG PO TABS: 25.0000 mg | ORAL_TABLET | Freq: Every evening | ORAL | Status: DC | PRN

## 2013-04-01 MED ORDER — CLOPIDOGREL BISULFATE 75 MG PO TABS
75.0000 mg | ORAL_TABLET | Freq: Every day | ORAL | Status: DC
  Administered 2013-04-02 – 2013-04-12 (×11): 75 mg via ORAL
  Filled 2013-04-01 (×12): qty 1

## 2013-04-01 MED ORDER — DIPHENHYDRAMINE HCL 12.5 MG/5ML PO ELIX
12.5000 mg | ORAL_SOLUTION | Freq: Four times a day (QID) | ORAL | Status: DC | PRN
  Filled 2013-04-01: qty 10

## 2013-04-01 MED ORDER — FLEET ENEMA 7-19 GM/118ML RE ENEM: 1.0000 | ENEMA | Freq: Once | RECTAL | Status: AC | PRN

## 2013-04-01 MED ORDER — ENOXAPARIN SODIUM 40 MG/0.4ML ~~LOC~~ SOLN
40.0000 mg | SUBCUTANEOUS | Status: DC
  Administered 2013-04-01 – 2013-04-11 (×11): 40 mg via SUBCUTANEOUS
  Filled 2013-04-01 (×13): qty 0.4

## 2013-04-01 MED ORDER — DICLOFENAC SODIUM 1 % TD GEL
2.0000 g | Freq: Four times a day (QID) | TRANSDERMAL | Status: DC
  Administered 2013-04-01: 2 g via TOPICAL
  Filled 2013-04-01: qty 100

## 2013-04-01 MED ORDER — PANTOPRAZOLE SODIUM 40 MG PO TBEC
40.0000 mg | DELAYED_RELEASE_TABLET | Freq: Every day | ORAL | Status: DC
  Administered 2013-04-01 – 2013-04-12 (×12): 40 mg via ORAL
  Filled 2013-04-01 (×14): qty 1

## 2013-04-01 MED ORDER — ALUM & MAG HYDROXIDE-SIMETH 200-200-20 MG/5ML PO SUSP: 30.0000 mL | ORAL | Status: DC | PRN

## 2013-04-01 MED ORDER — SIMVASTATIN 20 MG PO TABS
20.0000 mg | ORAL_TABLET | Freq: Every day | ORAL | Status: DC
  Administered 2013-04-02 – 2013-04-11 (×10): 20 mg via ORAL
  Filled 2013-04-01 (×13): qty 1

## 2013-04-01 MED ORDER — PROCHLORPERAZINE EDISYLATE 5 MG/ML IJ SOLN
5.0000 mg | Freq: Four times a day (QID) | INTRAMUSCULAR | Status: DC | PRN
  Filled 2013-04-01: qty 2

## 2013-04-01 MED ORDER — ACETAMINOPHEN 325 MG PO TABS: 325.0000 mg | ORAL_TABLET | ORAL | Status: DC | PRN

## 2013-04-01 MED ORDER — PROCHLORPERAZINE MALEATE 5 MG PO TABS
5.0000 mg | ORAL_TABLET | Freq: Four times a day (QID) | ORAL | Status: DC | PRN
  Filled 2013-04-01: qty 2

## 2013-04-01 MED ORDER — GUAIFENESIN-DM 100-10 MG/5ML PO SYRP: 5.0000 mL | ORAL_SOLUTION | Freq: Four times a day (QID) | ORAL | Status: DC | PRN

## 2013-04-01 MED ORDER — BISACODYL 10 MG RE SUPP: 10.0000 mg | Freq: Every day | RECTAL | Status: DC | PRN

## 2013-04-01 MED ORDER — PROCHLORPERAZINE 25 MG RE SUPP
12.5000 mg | Freq: Four times a day (QID) | RECTAL | Status: DC | PRN
  Filled 2013-04-01: qty 1

## 2013-04-01 NOTE — Progress Notes (Signed)
Admitting patient to CIR today. Delle Reining, CIR PA evaluated pt and reports that he would benefit from, and meets criteria for CIR. Dr Rito Ehrlich reports pt is medically stable for d/c to CIR.  Have met with patient and his wife and they are in agreement with plan. I can be reached at [phone]

## 2013-04-01 NOTE — Discharge Summary (Signed)
Physician Discharge Summary  Shiquan Mathieu ZOX:[id number] DOB: May 22, 1932 DOA: 03/29/2013  PCP: Dina Rich, MD  Admit date: 03/29/2013 Discharge date: 04/01/2013  Time spent: 25 minutes  Recommendations for Outpatient Follow-up:  1. Patient is being transferred to the inpatient rehabilitation service. 2. He'll follow up with neurology in 3 months.  Discharge Diagnoses:  Principal Problem:   Acute brainstem infarct Active Problems: Hypertension Leukocytosis Hyperlipidemia   Discharge Condition: Improved, being transferred to inpatient rehabilitation  Diet recommendation: Carb modified heart healthy  Filed Weights   03/29/13 1932 03/29/13 2220  Weight: 91.627 kg (202 lb) 90.266 kg (199 lb)    History of present illness:  79 y.o. male who presents with c/o L sided arm and leg weakness and dysarthria, preceded by generalized weakness for approximately 2 days. Was seen by PCP "who gave him a Z.pak for mycoplasma found in his urine". Patient developed L sided arm weakness and aphasia at 4pm today and so went into the hospital at the urging of his wife. In the ED at [facility], code stroke was called and [facility] Roseanne Reno evaluated patient and made the decision not to give TPA, hospitalist has been asked to admit.   Hospital Course:  Principal Problem:   Acute brainstem infarct: Patient was admitted to the hospitalist service. He had workup for stroke which included negative carotid Dopplers. 2-D echocardiogram was unremarkable, but did note grade 1 diastolic dysfunction. MRI/MRA noted acute right brainstem infarct and right vertebral artery with occlusion, the latter of which was felt to be chronic. Patient's antiplatelet therapy was changed to Plavix. He was started on statin after fasting lipid profile showed evidence of hyperlipidemia.  Hypertension: Patient's blood pressure trends down, he is restarting his beta blocker and Exforge.  Leukocytosis:  Patient's white blood cell count on 4/20 increased to 16. The following day, he recommended normal. This was felt more stressed margination from his stroke.  Hyperlipidemia: Started on statin  Chronic diastolic heart failure: Stable at this time. Already on beta blocker plus ARB  Procedures:  Echocardiogram: Grade 1 diastolic dysfunction  Carotid Dopplers done 4/19: No significant carotid artery stenosis bilaterally  Consultations:  Physical medicine and rehabilitation  Stroke service  Discharge Exam: Filed Vitals:   04/01/13 0108 04/01/13 0525 04/01/13 0900 04/01/13 1400  BP: 192/75 140/65 121/55 121/59  Pulse: 72 58 70 74  Temp: 97.9 F (36.6 C) 97.9 F (36.6 C) 97.5 F (36.4 C) 97.5 F (36.4 C)  TempSrc: Oral Oral Oral Oral  Resp: 15 16 18 18   Height:      Weight:      SpO2: 100% 98% 98% 97%    General: Alert and oriented x3, no acute distress Cardiovascular: Regular rate and rhythm, S1-S2 Respiratory: Clear to auscultation bilaterally Abdomen: Soft, nontender, nondistended, positive bowel sounds Neuro: Some left-sided weakness present on the lower extremity versus upper extremity Extremities: No clubbing or cyanosis, trace pitting edema  Discharge Instructions   Future Appointments Provider Department Dept Phone   04/02/2013 9:00 AM [place], PT [facility]  [address]  [place] [phone]   04/02/2013 11:00 AM Agustin Cree, OT [facility]  [address]  [place] [phone]   04/02/2013 1:00 PM Clent Jacks [facility]  [address]  [place] [phone]   04/02/2013 2:15 PM [place], PT [facility]  [address]  [place] [phone]       Medication List  TAKE these medications       amLODipine-valsartan 5-320 MG per tablet  Commonly known as:  EXFORGE  Take 1 tablet by mouth daily.     aspirin EC 81 MG tablet  Take 81 mg by mouth daily.      metoprolol succinate 50 MG 24 hr tablet  Commonly known as:  TOPROL-XL  Take 25 mg by mouth daily. Take with or immediately following a meal.     multivitamin with minerals Tabs  Take 1 tablet by mouth daily.     pantoprazole 40 MG tablet  Commonly known as:  PROTONIX  Take 40 mg by mouth daily.           Follow-up Information   Follow up with Gates Rigg, MD In 1 month. (Call office 1-2 days after discharge for a followup appointment in 1 - 2 months)    Contact information:   [address] [place] [address] [phone]       Follow up with [name],[name], MD. Schedule an appointment as soon as possible for a visit in 1 month.   Contact information:   [address] [place] [address] [phone]        The results of significant diagnostics from this hospitalization (including imaging, microbiology, ancillary and laboratory) are listed below for reference.    Significant Diagnostic Studies: Ct Head Wo Contrast  03/29/2013    IMPRESSION: No evidence of acute intracranial abnormality.  Mild atrophy with small vessel ischemic changes and intracranial atherosclerosis.   Original Report Authenticated By: Charline Bills, M.D.    Mr Angiogram Head Wo Contrast  03/29/2013 IMPRESSION: 1.  Nondominant distal right vertebral artery is occluded just beyond the right PICA origin.  However, this is of doubtful acute significance as the dominant left vertebral artery that primarily supplies the basilar is patent without stenosis. 2.  No significant basilar artery stenosis.  No focal basilar artery lesion to correspond to the acute MRI findings. 3.  Evidence of ICA and PCA atherosclerosis without hemodynamically significant stenosis. 4.  Anterior circulation otherwise is negative.  Study discussed by telephone with Dr. Dione Booze on 03/29/2013 at 2235 hours.   Original Report Authenticated By: Erskine Speed, M.D.    Mr Brain Wo Contrast  03/29/2013     IMPRESSION: 1.  Acute right brainstem infarct:  basilar perforator artery territory in the right paracentral pons.  No mass effect or hemorrhage. 2. See MRA findings below. 3. Abnormal C2 vertebra, but most consistent with Paget's disease (benign).  This was evaluated with radiographs and bone scan on 08/10/2009.      Labs: Basic Metabolic Panel:  Recent Labs Lab 03/29/13 1930  NA 143  K 3.8  CL 109  CO2 19  GLUCOSE 127*  BUN 30*  CREATININE 1.14  CALCIUM 10.0   Liver Function Tests:  Recent Labs Lab 03/29/13 1930  AST 16  ALT 12  ALKPHOS 121*  BILITOT 0.4  PROT 7.5  ALBUMIN 3.8   CBC:  Recent Labs Lab 03/29/13 1930 03/31/13 1630  WBC 16.2* 8.1  NEUTROABS 12.9*  --   HGB 14.0 13.6  HCT 40.4 37.8*  MCV 91.2 88.7  PLT 323 243   CBG:  Recent Labs Lab 03/29/13 1939  GLUCAP 117*       Signed:  Artelia Game K  [facility] 04/01/2013, 4:32 PM

## 2013-04-01 NOTE — Progress Notes (Signed)
Physical Therapy Treatment Patient Details Name: Lucas Green MRN: [id number] DOB: [birth year] Today's Date: 04/01/2013 Time: 4098-1191 PT Time Calculation (min): 42 min  PT Assessment / Plan / Recommendation Comments on Treatment Session  79 y/o male adm. left sided weakness and slurred speech. MRI (+) for right brainstem infarct: basilar perforator artery territory in the right paracentral pons. Progressing nicely today. Still very fearful of falling with impaired balance and control of LLE during gait and standing activities. Wants to get back to work and independence.     Follow Up Recommendations  CIR     Does the patient have the potential to tolerate intense rehabilitation     Barriers to Discharge        Equipment Recommendations  None recommended by PT    Recommendations for Other Services    Frequency Min 4X/week   Plan Discharge plan remains appropriate;Frequency remains appropriate    Precautions / Restrictions Precautions Precautions: Fall Restrictions Weight Bearing Restrictions: No   Pertinent Vitals/Pain Denies pain    Mobility  Bed Mobility Supine to Sit: 5: Supervision;HOB flat Details for Bed Mobility Assistance: increased time to complete as well as increased effort pushing through weaker left upper extremity; cues for efficiency and sequencing Transfers Transfers: Sit to Stand;Stand to Sit Sit to Stand: 4: Min assist;With upper extremity assist Stand to Sit: 4: Min assist;With upper extremity assist Details for Transfer Assistance: min facilitation to bring trunk over BOS as well as maintain midline trunk posture and 50/50 weight bearing through lower extremities through sit<>stand transfers performed x10 for strengthening (pt with preference to the right limiting use of LLE); facilitation to control lowering to chair without use of upper extremities to assist (at times can require modA) Ambulation/Gait Ambulation/Gait Assistance: 4: Min assist;3: Mod  assist Ambulation Distance (Feet): 110 Feet Assistive device: None;1 person hand held assist Ambulation/Gait Assistance Details: facilitation to open up upper trunk and extend (tends to rotate away from weaker side and flex forward); stability assist throughout gait with focus on weight shift and bigger steps; ambulates with flexed knee on the left with decreased control; LLE fatigues quickly with ambulation Gait Pattern: Step-through pattern;Decreased stride length;Decreased weight shift to left;Trunk rotated posteriorly on right;Lateral trunk lean to right;Left flexed knee in stance      PT Goals Acute Rehab PT Goals PT Goal: Supine/Side to Sit - Progress: Progressing toward goal PT Goal: Sit to Stand - Progress: Progressing toward goal PT Goal: Stand to Sit - Progress: Progressing toward goal Pt will Transfer Bed to Chair/Chair to Bed: with modified independence PT Transfer Goal: Bed to Chair/Chair to Bed - Progress: Goal set today PT Goal: Stand - Progress: Progressing toward goal PT Goal: Ambulate - Progress: Progressing toward goal  Visit Information  Last PT Received On: 04/01/13 Assistance Needed: +1    Subjective Data  Subjective: Everyone here has been very professional. I am just getting frustrated with my situation.  Patient Stated Goal: back to work, walk independently   Cognition  Cognition Arousal/Alertness: Awake/alert Behavior During Therapy: WFL for tasks assessed/performed (anxiety with mobility but pleasant) Overall Cognitive Status: Within Functional Limits for tasks assessed    Balance  Static Standing Balance Static Standing - Balance Support: No upper extremity supported Static Standing - Comment/# of Minutes: varying levels of support, (minA-mingaurdA); facilitation for tall and midline posture as well as active weight bearing through LLE; stood 1 minute without upper extremity support needing mingaurdA and constant verbal cues for trunk and lower extremity  engagement; used visual feedback in Patent examiner Dynamic Standing - Balance Support: Left upper extremity supported Dynamic Standing - Balance Activities: Lateral lean/weight shifting;Forward lean/weight shifting Dynamic Standing - Comments: pre-gait stepping activities with active weight bearing through affected upper and lower extremity, facilitation for attention to tall and open posture as well as stability at left knee; pt stepping forward and backward with RLE x10 fatiguing and needing a seated rest   End of Session PT - End of Session Equipment Utilized During Treatment: Gait belt Activity Tolerance: Patient tolerated treatment well Patient left: in chair;with call bell/phone within reach Nurse Communication: Mobility status   GP     [facility] HELEN 04/01/2013, 12:36 PM

## 2013-04-01 NOTE — H&P (Signed)
Physical Medicine and Rehabilitation Admission H&P  Chief Complaint   Patient presents with   .  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 He stated he'd been feeling generally weak for about 3 days prior to admission. With the onset of his left side weakness on 03/29/2013 he also had slurred speech. He noticed slight numbness in his left upper extremity as well. There is no previous history of stroke nor TIA.Marland Kitchen MRI/MRA brain done revealing acute right infarct paracentral pontine infarct and right VA with occlusion likely chronic. 2D echo with EF 60-65% without wall abnormality. Carotid dopplers without ICA stenosis. Was changed to plavix for ischemic infarct due to SVD. Patient continues with mild dysarthria with left sided weakness. PT/OT/ST evaluations done and are recommending CIR for progressive therapies.    Review of Systems  HENT: Negative for hearing loss.  Eyes: Negative for blurred vision and double vision.  Respiratory: Negative for cough and shortness of breath.  Cardiovascular: Negative for chest pain and palpitations.  Gastrointestinal: Negative for heartburn, nausea, vomiting and constipation.  Genitourinary: Positive for urgency and frequency (Incontinence new since admission.).  Musculoskeletal: Positive for back pain and joint pain (bilateral hips and knees. Needs right shoulder replacement. ).  Neurological: Positive for dizziness and focal weakness. Negative for headaches.  Past Medical History   Diagnosis  Date   .  Cancer      prostate   .  Hypertension    .  GERD (gastroesophageal reflux disease)     Past Surgical History   Procedure  Laterality  Date   .  Colon surgery      Family History   Problem  Relation  Age of Onset   .  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 He reports that he has never smoked. He has never used smokeless tobacco. He reports that drinks alcohol--couple of martinis on the weekends. He reports that he does not use illicit drugs.  Allergies   Allergen  Reactions   .  Other      Beer and Wine- swelling/headache/nausea    Medications Prior to Admission   Medication  Sig  Dispense  Refill   .  amLODipine-valsartan (EXFORGE) 5-320 MG per tablet  Take 1 tablet by mouth daily.     Marland Kitchen  aspirin EC 81 MG tablet  Take 81 mg by mouth daily.     .  metoprolol succinate (TOPROL-XL) 50 MG 24 hr tablet  Take 25 mg by mouth daily. Take with or immediately following a meal.     .  Multiple Vitamin (MULTIVITAMIN WITH MINERALS) TABS  Take 1 tablet by mouth daily.     .  pantoprazole (PROTONIX) 40 MG tablet  Take 40 mg by mouth daily.      Home:  Home Living  Lives With: Spouse  Available Help at Discharge: Available PRN/intermittently  Type of Home: House  Home Access: Stairs to enter  [employer] of Steps: 1  Entrance Stairs-Rails: None  Home Layout: One level  Bathroom Shower/Tub: Pension scheme manager: Standard  Home Adaptive Equipment: None  Functional History:  Prior Function  Able to Take Stairs?: Yes  Driving: Yes  Vocation: Part  time employment  Comments: physician in addiction medicine  Functional Status:  Mobility:  Bed Mobility  Bed Mobility: Supine to Sit;Sitting - Scoot to Edge of Bed  Supine to Sit: 5: Supervision;HOB flat  Sitting - Scoot to [employer] of Bed: 4: Min guard  Transfers  Transfers: Sit to Stand;Stand to [employer] to Stand: 4: Min assist;With upper extremity assist  Stand to Sit: 4: Min assist;With upper extremity assist  Ambulation/Gait  Ambulation/Gait Assistance: 4: Min assist;3: Mod assist  Ambulation Distance (Feet): 110 Feet  Assistive device: None;1 person hand held assist  Ambulation/Gait  Assistance Details: facilitation to open up upper trunk and extend (tends to rotate away from weaker side and flex forward); stability assist throughout gait with focus on weight shift and bigger steps; ambulates with flexed knee on the left with decreased control; LLE fatigues quickly with ambulation  Gait Pattern: Step-through pattern;Decreased stride length;Decreased weight shift to left;Trunk rotated posteriorly on right;Lateral trunk lean to right;Left flexed knee in stance  General Gait Details: decreased step height, decreased control with LLE  Stairs: No   ADL:  ADL  Eating/Feeding: Performed;Independent  Where Assessed - Eating/Feeding: Chair  Grooming: Performed;Wash/dry hands;Wash/dry face  Where Assessed - Grooming: Supported standing  Upper Body Bathing: Simulated;Min guard  Where Assessed - Upper Body Bathing: Supported standing  Upper Body Dressing: Performed;Minimal assistance  Where Assessed - Upper Body Dressing: Supported standing  Lower Body Dressing: Performed;Minimal assistance  Where Assessed - Lower Body Dressing: Unsupported sitting  Toilet Transfer: Performed;Minimal assistance  Toilet Transfer Method: Sit to Production manager: Regular height toilet;Grab bars  Equipment Used: Gait belt  Transfers/Ambulation Related to ADLs: Min assist for ambulation with and HHA. Pt pushing heavily through RUE.  ADL Comments: pt able to stand for hygiene with min guard A after toilteing and using grab bar  Cognition:  Cognition  Overall Cognitive Status: Within Functional Limits for tasks assessed  Arousal/Alertness: Awake/alert  Orientation Level: Oriented X4  Attention: Focused  Focused Attention: Appears intact  Memory: Appears intact  Awareness: Appears intact  Problem Solving: Appears intact  Safety/Judgment: Appears intact  Cognition  Arousal/Alertness: Awake/alert  Behavior During Therapy: WFL for tasks assessed/performed  Overall Cognitive Status:  Within Functional Limits for tasks assessed  Physical Exam:  Blood pressure 121/59, pulse 74, temperature 97.5 F (36.4 C), temperature source Oral, resp. rate 18, height 6\' 2"  (1.88 m), weight 90.266 kg (199 lb), SpO2 97.00%.  Physical Exam  Nursing note and vitals reviewed.  Constitutional: He is oriented to person, place, and time. He appears well-developed and well-nourished.  HENT: dentition intact. Oral mucosa pink and moist Head: Normocephalic and atraumatic.  Eyes: Pupils are equal, round, and reactive to light.  Neck: Normal range of motion.  Cardiovascular: Normal rate and regular rhythm. SEM appreciated. Pulmonary/Chest: Effort normal and breath sounds normal. No wheezes or rales. Abdominal: Soft. Bowel sounds are normal. Non tender Neurological: He is alert and oriented to person, place, and time.  Mild dysarthria. Follows commands without difficulty. Left sided weakness with decreased fine motor coordination noted. No gross ataxia. LUE MMT grossly 4 to 4+/5. LLE is  Grossly 4 to 4+ as well.  No sensory deficits appreciated. Mild left central 7. Cognitively with good insight and awareness. STM intact.   Results for orders placed during the hospital encounter of 03/29/13 (from the past 48 hour(s))   CBC Status: Abnormal    Collection Time    03/31/13 4:30 PM   Result  Value  Range    WBC  8.1  4.0 - 10.5 K/uL    RBC  4.26  4.22 - 5.81 MIL/uL    Hemoglobin  13.6  13.0 - 17.0 g/dL    HCT  16.1 (*)  09.6 - 52.0 %    MCV  88.7  78.0 - 100.0 fL    MCH  31.9  26.0 - 34.0 pg    MCHC  36.0  30.0 - 36.0 g/dL    RDW  04.5  40.9 - 81.1 %    Platelets  243  150 - 400 K/uL    No results found.  Post Admission Physician Evaluation:  1. Functional deficits secondary to thrombotic right paracentral pontine infarct. 2. Patient is admitted to receive collaborative, interdisciplinary care between the physiatrist, rehab nursing staff, and therapy team. 3. Patient's level of medical  complexity and substantial therapy needs in context of that medical necessity cannot be provided at a lesser intensity of care such as a SNF. 4. Patient has experienced substantial functional loss from his/her baseline which was documented above under the "Functional History" and "Functional Status" headings. Judging by the patient's diagnosis, physical exam, and functional history, the patient has potential for functional progress which will result in measurable gains while on inpatient rehab. These gains will be of substantial and practical use upon discharge in facilitating mobility and self-care at the household level. 5. Physiatrist will provide 24 hour management of medical needs as well as oversight of the therapy plan/treatment and provide guidance as appropriate regarding the interaction of the two. 6. 24 hour rehab nursing will assist with bladder management, bowel management, safety, skin/wound care, disease management, medication administration and patient education and help integrate therapy concepts, techniques,education, etc. 7. PT will assess and treat for/with: Lower extremity strength, range of motion, stamina, balance, functional mobility, safety, adaptive techniques and equipment, NMR, coordination. Goals are: mod I. 8. OT will assess and treat for/with: ADL's, functional mobility, safety, upper extremity strength, adaptive techniques and equipment, NMR, coordination, education. Goals are: mod I to supervision. 9. SLP will assess and treat for/with: communication, speech. Goals are: mod I. 10. Case Management and Social Worker will assess and treat for psychological issues and discharge planning. 11. Team conference will be held weekly to assess progress toward goals and to determine barriers to discharge. 12. Patient will receive at least 3 hours of therapy per day at least 5 days per week. 13. ELOS: 7-10 days Prognosis: excellent   Medical Problem List and Plan:  1. DVT  Prophylaxis/Anticoagulation: Pharmaceutical: Lovenox  2. Pain Management: Continue tylenol prn for now.  3. Mood: Seems to be appropriate. Will have LCSW follow for evaluation.  4. Neuropsych: This patient Is capable of making decisions on his/her own behalf.  5. HTN: monitor BP with bid checks. Currently BP on low side--amlodipine-valsartan held. Continue Metoprolol.  6. Dyslipidemia: continue Zocor.  7. Pre Renal Insufficiency: push po fluids. Will recheck in am.  8. Urgency with incontinence: Neurogenic bladder? Will check UA/UCS . Will check PVRs.   Denies urinary incontinence or frequency PTA despite prostate cancer hx  Ranelle Oyster, MD, [place] [facility]  04/01/2013

## 2013-04-01 NOTE — PMR Pre-admission (Signed)
[facility] PMR Admission Coordinator Pre-Admission Assessment  Patient: Lucas Green is an 79 y.o., male MRN: [id number] DOB: September 05, 1932 Height: 6\' 2"  (188 cm) Weight: 90.266 kg (199 lb)  Insurance Information Primary: Medicare Policy#: [id number] Subscriber: Patient Benefits: [place] Effective Date: 03/13/1999 Deductible: $1126 OOP Max: None Life Max: Unlimited CIR: 100% SNF: 100 days LBD:  Outpatient: 80% Copay: 20%  Home Health: 100% DME: 80%  Copay: 20% Providers: Patient's choice  SECONDARY: Generic Commercial      Policy#: [id number]      Subscriber: self  Emergency Contact Information Contact Information   Name Relation Home Work Mobile   [name],[name] Spouse [phone]      Current Medical History  Patient Admitting Diagnosis: Acute right brainstem infarct and right VA with occlusion, likely chronic  History of Present Illness: 79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 He stated he'd been feeling generally weak for about 3 days prior to admission. With the onset of his left side weakness on 03/29/2013 he also had slurred speech. He noticed slight numbness in his left upper extremity as well. There is no previous history of stroke nor TIA.Marland Kitchen MRI/MRA brain done revealing acute right brainstem infarct and right VA with occlusion likely chronic. 2D echo with EF 60-65% without wall abnormality. Carotid dopplers without ICA stenosis. Was changed to plavix for ischemic infarct due to SVD. Patient continues with mild dysarthria with left sided weakness.   NIH Stroke scale:2  Past Medical History  Past Medical History  Diagnosis Date  . Cancer     prostate  . Hypertension   . GERD (gastroesophageal reflux disease)    Family History  Family history includes Heart disease in his father.  Prior Rehab/Hospitalizations: none  Current  Medications See MAR  Patients Current Diet: Heart Healthy  Precautions / Restrictions Precautions Precautions: Fall Restrictions Weight Bearing Restrictions: No   Prior Activity Level Community (5-7x/wk): Active daily Home Assistive Devices / Equipment Home Assistive Devices/Equipment: None Home Adaptive Equipment: None   Prior Functional Level Current Functional Level  Bed Mobility   Independent   Min assist   Transfers    Independent   Min assist   Mobility - Walk/Wheelchair    Independent   Mod assist / 110'   Upper Body Dressing    Independent   Min assist   Lower Body Dressing    Independent   Min assist   Grooming    Independent   Mod I   Eating/Drinking    Independent   Mod I   Toilet Transfer    Independent   Min assist   Bladder Continence     Independent   Incontinent at times   Bowel Management    Independent   LBM 4/20 continent   Stair Climbing  Able to Take Stairs?: Yes      Communication    Independent   WDL   Memory    Independent   WDL   Cooking/Meal Prep    Independent      Housework    Independent    Money Management    Independent   Driving  Yes     Previous Home Environment Living Arrangements: Spouse/significant other Lives With: Spouse Available Help at Discharge: Available PRN/intermittently Type of Home: House Home Layout: One level Home Access: Stairs to enter Entrance Stairs-Rails: None Entrance Stairs-Number of Steps: 1 Bathroom Shower/Tub: Walk-in  shower Bathroom Toilet: Standard Home Care Services: No  Discharge Living Setting Plans for Discharge Living Setting: Patient's home Type of Home at Discharge: House Discharge Home Layout: Multi-level;Able to live on main level with bedroom/bathroom Alternate Level Stairs-Number of Steps: flight Discharge Home Access: Stairs to enter Entrance Stairs-Rails: None Entrance Stairs-Number of Steps: 1 Discharge Bathroom Shower/Tub: Walk-in  shower Discharge Bathroom Toilet: Standard Discharge Bathroom Accessibility: Yes How Accessible: Accessible via walker Do you have any problems obtaining your medications?: No  Social/Family/Support Systems Patient Roles: Spouse;Parent; 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 Contact Information: [phone] Anticipated Caregiver: Wife: [name] Anticipated Caregiver's Contact Information: [phone] Ability/Limitations of Caregiver: Min assist Caregiver Availability: 24/7. Wife works p/t but will take time off as needed Discharge Plan Discussed with Primary Caregiver: Yes Is Caregiver In Agreement with Plan?: Yes Does Caregiver/Family have Issues with Lodging/Transportation while Pt is in Rehab?: No  Goals/Additional Needs Patient/Family Goal for Rehab: PT & OT: supervision Expected length of stay: 10 days Cultural Considerations: none Dietary Needs: heart healthy Equipment Needs: TBD Pt/Family Agrees to Admission and willing to participate: Yes Program Orientation Provided & Reviewed with Pt/Caregiver Including Roles  & Responsibilities: Yes  Decrease burden of Care through IP rehab admission: n/a   Possible need for SNF placement upon discharge: No   Patient Condition:Assessment/Plan: Diagnosis: right paracentral pontine infarct 1. Does the need for close, 24 hr/day medical supervision in concert with the patient's rehab needs make it unreasonable for this patient to be served in a less intensive setting? Yes 2. Co-Morbidities requiring supervision/potential complications: htn, prostate cancer 3. Due to bladder management, bowel management, safety, skin/wound care, disease management, medication administration and patient education, does the patient require 24 hr/day rehab nursing? Yes 4. Does the patient require coordinated care of a physician, rehab nurse, PT (1-2 hrs/day, 5 days/week), OT (1-2 hrs/day, 5 days/week) and SLP (1-2  hrs/day, 5 days/week) to address physical and functional deficits in the context of the above medical diagnosis(es)? Yes Addressing deficits in the following areas: balance, endurance, locomotion, strength, transferring, bowel/bladder control, bathing, dressing, feeding, grooming, toileting and speech 5. Can the patient actively participate in an intensive therapy program of at least 3 hrs of therapy per day at least 5 days per week? Yes 6. The potential for patient to make measurable gains while on inpatient rehab is excellent 7. Anticipated functional outcomes upon discharge from inpatient rehab are mod I with PT, mod I with OT, mod I with SLP. 8. Estimated rehab length of stay to reach the above functional goals is: 7-10 days 9. Does the patient have adequate social supports to accommodate these discharge functional goals? Yes 10. Anticipated D/C setting: Home 11. Anticipated post D/C treatments: Outpt therapy 12. Overall Rehab/Functional Prognosis: excellent  RECOMMENDATIONS: This patient's condition is appropriate for continued rehabilitative care in the following setting: CIR Patient has agreed to participate in recommended program. Yes Note that insurance prior authorization may be required for reimbursement for recommended care.  Comment:   Ranelle Oyster, MD, FAAPMR   Preadmission Screen Completed By:  Meryl Dare, 04/01/2013 2:37 PM ______________________________________________________________________   Have evaluated patient and reviewed patients medical record. Patient meets criteria for inpatient rehab. Have discussed the patient's medical and functional status with the Rehabilitation Physician and he has determined that the patient's condition is appropriate for intensive rehabilitative care in an inpatient rehabilitation facility. Will admit patient to inpatient rehab today.  Admission Coordinator:  Meryl Dare,  time 1515/Date 04/01/13    Meryl Dare 04/01/2013

## 2013-04-01 NOTE — Consult Note (Signed)
Physical Medicine and Rehabilitation Consult  Reason for Consult: left sided weakness and slurred speech Referring Physician: Dr. 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 He stated he'd been feeling generally weak for about 3 days prior to admission. With the onset of his left side weakness on 03/29/2013 he also had slurred speech. He noticed slight numbness in his left upper extremity as well. There is no previous history of stroke nor TIA.Marland Kitchen MRI/MRA brain done revealing acute right brainstem infarct and right VA with occlusion likely chronic. 2D echo with EF 60-65% without wall abnormality. Carotid dopplers without ICA stenosis. Was changed to plavix for ischemic infarct due to SVD. Patient continues with mild dysarthria with left sided weakness. PT/OT/ST evaluations done and are recommending CIR for progressive therapies.   Review of Systems  HENT: Negative for hearing loss.   Eyes: Negative for blurred vision and double vision.  Respiratory: Negative for cough and shortness of breath.   Cardiovascular: Negative for chest pain and palpitations.  Gastrointestinal: Negative for heartburn, nausea, vomiting and constipation.  Genitourinary: Positive for urgency and frequency (Incontinence new since admission.).  Musculoskeletal: Positive for back pain and joint pain (bilateral hips and knees. Needs right shoulder replacement. ).  Neurological: Positive for dizziness and focal weakness. Negative for headaches.   Past Medical History  Diagnosis Date  . Cancer     prostate  . Hypertension   . GERD (gastroesophageal reflux disease)    Past Surgical History  Procedure Laterality Date  . Colon surgery     Family History  Problem Relation Age of Onset  . 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 He reports that he has never smoked. He has never used smokeless tobacco. He reports that  drinks alcohol--couple of martinis on the weekends. He reports that he does not use illicit drugs.   Allergies  Allergen Reactions  . Other     Beer and Wine- swelling/headache/nausea   Medications Prior to Admission  Medication Sig Dispense Refill  . amLODipine-valsartan (EXFORGE) 5-320 MG per tablet Take 1 tablet by mouth daily.      Marland Kitchen aspirin EC 81 MG tablet Take 81 mg by mouth daily.      . metoprolol succinate (TOPROL-XL) 50 MG 24 hr tablet Take 25 mg by mouth daily. Take with or immediately following a meal.      . Multiple Vitamin (MULTIVITAMIN WITH MINERALS) TABS Take 1 tablet by mouth daily.      . pantoprazole (PROTONIX) 40 MG tablet Take 40 mg by mouth daily.        Home: Home Living Lives With: Spouse Available Help at Discharge: Available PRN/intermittently Type of Home: House Home Access: Stairs to enter Secretary/administrator of Steps: 1 Entrance Stairs-Rails: None Home Layout: One level Bathroom Shower/Tub: Health visitor: Standard Home Adaptive Equipment: None  Functional History: Prior Function Able to Take Stairs?: Yes Driving: Yes Vocation: Part time employment Comments: physician in addiction medicine Functional Status:  Mobility: Bed Mobility Bed Mobility: Supine to Sit;Sitting - Scoot to Edge of Bed Supine to Sit: 5: Supervision;HOB flat Sitting - Scoot to Edge of Bed: 4: Min guard Transfers Transfers: Sit to Stand;Stand to Sit Sit to Stand: 4: Min assist;With upper extremity assist  Stand to Sit: 4: Min assist;With upper extremity assist Ambulation/Gait Ambulation/Gait Assistance: 4: Min assist;3: Mod assist Ambulation Distance (Feet): 110 Feet Assistive device: None;1 person hand held assist Ambulation/Gait Assistance Details: facilitation to open up upper trunk and extend (tends to rotate  away from weaker side and flex forward); stability assist throughout gait with focus on weight shift and bigger steps; ambulates with flexed knee on the left with decreased control; LLE fatigues quickly with ambulation Gait Pattern: Step-through pattern;Decreased stride length;Decreased weight shift to left;Trunk rotated posteriorly on right;Lateral trunk lean to right;Left flexed knee in stance General Gait Details: decreased step height, decreased control with LLE Stairs: No    ADL: ADL Eating/Feeding: Performed;Independent Where Assessed - Eating/Feeding: Chair Upper Body Dressing: Performed;Set up Where Assessed - Upper Body Dressing: Unsupported sitting Lower Body Dressing: Performed;Minimal assistance Where Assessed - Lower Body Dressing: Unsupported sitting Toilet Transfer: Simulated;Moderate assistance Toilet Transfer Method: Sit to Barista:  (bed) Equipment Used: Gait belt Transfers/Ambulation Related to ADLs: Mod assist for ambulation with and HHA.  Pt pushing heavily through RUE. ADL Comments: Pt fearful of falling during all standing tasks. Although he reports that his LUE feels weaker, pt not limited functionally and even able to tie gown.  Cognition: Cognition Overall Cognitive Status: Within Functional Limits for tasks assessed Arousal/Alertness: Awake/alert Orientation Level: Oriented X4 Attention: Focused Focused Attention: Appears intact Memory: Appears intact Awareness: Appears intact Problem Solving: Appears intact Safety/Judgment: Appears intact Cognition Arousal/Alertness: Awake/alert Behavior During Therapy: WFL for tasks assessed/performed (anxiety with mobility but pleasant) Overall Cognitive Status: Within Functional Limits for tasks assessed  Blood pressure 121/55, pulse 70, temperature 97.5 F (36.4 C), temperature source Oral, resp. rate 18, height 6\' 2"  (1.88 m), weight 90.266 kg (199 lb), SpO2 98.00%. Physical Exam   Nursing note and vitals reviewed. Constitutional: He is oriented to person, place, and time. He appears well-developed and well-nourished.  HENT:  Head: Normocephalic and atraumatic.  Eyes: Pupils are equal, round, and reactive to light.  Neck: Normal range of motion.  Cardiovascular: Normal rate and regular rhythm.   Pulmonary/Chest: Effort normal and breath sounds normal. He has no wheezes.  Abdominal: Soft. Bowel sounds are normal. He exhibits no distension. There is no tenderness.  Musculoskeletal: He exhibits no edema and no tenderness.  Right shoulder RTC pathology with weakness/ pain with ROM.  Neurological: He is alert and oriented to person, place, and time.  Mild dysarthria. Follows commands without difficulty. Mild left pronater drift and ataxia with finger to nose. Squinting with right eye to focus occasionally. Mild left sided weakness LE>UE.   Skin: Skin is warm and dry.  Psychiatric: His speech is normal and behavior is normal. Judgment and thought content normal. Cognition and memory are normal.    Results for orders placed during the hospital encounter of 03/29/13 (from the past 24 hour(s))  CBC     Status: Abnormal   Collection Time    03/31/13  4:30 PM      Result Value Range   WBC 8.1  4.0 - 10.5 K/uL   RBC 4.26  4.22 - 5.81 MIL/uL   Hemoglobin 13.6  13.0 - 17.0 g/dL   HCT 78.2 (*) 95.6 - 21.3 %   MCV 88.7  78.0 - 100.0 fL   MCH 31.9  26.0 - 34.0 pg   MCHC 36.0  30.0 - 36.0 g/dL   RDW 08.6  57.8 - 46.9 %   Platelets 243  150 - 400 K/uL  No results found.  Assessment/Plan: Diagnosis: right paracentral pontine infarct---see PAS form for rehab recs   04/01/2013

## 2013-04-01 NOTE — Progress Notes (Signed)
Occupational Therapy Treatment Patient Details Name: Lucas Green MRN: [id number] DOB: 1931-12-22 Today's Date: 04/01/2013 Time: 1350-1406 OT Time Calculation (min): 16 min  OT Assessment / Plan / Recommendation Comments on Treatment Session Pt making progress and doing well. Pt participating in ADL tasks while standing to increase balance/safety. Pt states that he feels like his L UE/hand coordination is improving, although he required assist getting toilet tissue, with opening toothpaste and picking up straws. Pt should continue with acute OT services to maximize level  of function and safety. Pt scheduled to d/c to CIR today or tomorrow    Follow Up Recommendations  CIR    Barriers to Discharge   none    Equipment Recommendations  None recommended by OT;Other (comment) (TBD)    Recommendations for Other Services    Frequency Min 3X/week   Plan Discharge plan remains appropriate    Precautions / Restrictions Precautions Precautions: Fall Restrictions Weight Bearing Restrictions: No   Pertinent Vitals/Pain     ADL  Grooming: Performed;Wash/dry hands;Wash/dry face Where Assessed - Grooming: Supported standing Upper Body Bathing: Simulated;Min guard Where Assessed - Upper Body Bathing: Supported standing Upper Body Dressing: Performed;Minimal assistance Where Assessed - Upper Body Dressing: Supported standing Toilet Transfer: Performed;Minimal Dentist Method: Sit to Barista: Regular height toilet;Grab bars Toileting - Clothing Manipulation and Hygiene: Performed;Minimal assistance Where Assessed - Engineer, mining and Hygiene: Standing Equipment Used: Gait belt Transfers/Ambulation Related to ADLs: Min assist for ambulation with and HHA.  Pt pushing heavily through RUE. ADL Comments: pt able to stand for hygiene with min guard A after toilteing and using grab bar    OT Diagnosis:    OT Problem List:   OT  Treatment Interventions:     OT Goals ADL Goals ADL Goal: Grooming - Progress: Progressing toward goals ADL Goal: Upper Body Dressing - Progress: Progressing toward goals ADL Goal: Toilet Transfer - Progress: Progressing toward goals ADL Goal: Toileting - Clothing Manipulation - Progress: Progressing toward goals Miscellaneous OT Goals OT Goal: Miscellaneous Goal #1 - Progress: Progressing toward goals OT Goal: Miscellaneous Goal #2 - Progress: Progressing toward goals  Visit Information  Last OT Received On: 04/01/13 Assistance Needed: +1    Subjective Data  Subjective: " I hope to go to inpatient rehab today ' Patient Stated Goal: To return home after CIR   Prior Functioning       Cognition  Cognition Arousal/Alertness: Awake/alert Behavior During Therapy: WFL for tasks assessed/performed Overall Cognitive Status: Within Functional Limits for tasks assessed    Mobility  Bed Mobility Bed Mobility: Supine to Sit;Sitting - Scoot to Edge of Bed Supine to Sit: 5: Supervision;HOB flat Sitting - Scoot to [place] of Bed: 4: Min guard Details for Bed Mobility Assistance: increased time to complete as well as increased effort pushing through weaker left upper extremity; cues for efficiency and sequencing Transfers Transfers: Sit to Stand;Stand to Sit Sit to Stand: 4: Min assist;With upper extremity assist Stand to Sit: 4: Min assist;With upper extremity assist Details for Transfer Assistance: min facilitation to bring trunk over BOS as well as maintain midline trunk posture and 50/50 weight bearing through lower extremities through sit<>stand transfers performed x10 for strengthening (pt with preference to the right limiting use of LLE); facilitation to control lowering to chair without use of upper extremities to assist (at times can require modA)    Exercises      Balance Balance Balance Assessed: Yes  Dynamic Standing Balance Dynamic Standing -  Balance Support: Left upper  extremity supported Dynamic Standing - Level of Assistance: 4: Min assist Dynamic Standing - Balance Activities: Lateral lean/weight shifting;Forward lean/weight shifting   End of Session OT - End of Session Equipment Utilized During Treatment: Gait belt Activity Tolerance: Patient tolerated treatment well Patient left: in bed;with call bell/phone within reach;with family/visitor present  GO     Galen Manila 04/01/2013, 2:22 PM

## 2013-04-01 NOTE — Progress Notes (Signed)
Rehab Admissions Coordinator Note:  Patient was screened by Meryl Dare for appropriateness for an Inpatient Acute Rehab Consult.  At this time, we are recommending Inpatient Rehab consult.  Meryl Dare 04/01/2013, 8:20 AM  I can be reached at [phone].

## 2013-04-01 NOTE — Progress Notes (Signed)
UR COMPLETED  

## 2013-04-02 ENCOUNTER — Inpatient Hospital Stay (HOSPITAL_COMMUNITY): Payer: Medicare Other | Admitting: Occupational Therapy

## 2013-04-02 ENCOUNTER — Inpatient Hospital Stay (HOSPITAL_COMMUNITY): Payer: PRIVATE HEALTH INSURANCE | Admitting: Physical Therapy

## 2013-04-02 ENCOUNTER — Inpatient Hospital Stay (HOSPITAL_COMMUNITY): Payer: Medicare Other | Admitting: Speech Pathology

## 2013-04-02 ENCOUNTER — Inpatient Hospital Stay (HOSPITAL_COMMUNITY): Payer: Medicare Other | Admitting: Physical Therapy

## 2013-04-02 ENCOUNTER — Encounter (HOSPITAL_COMMUNITY): Payer: Self-pay

## 2013-04-02 DIAGNOSIS — I10 Essential (primary) hypertension: Secondary | ICD-10-CM

## 2013-04-02 DIAGNOSIS — R32 Unspecified urinary incontinence: Secondary | ICD-10-CM

## 2013-04-02 DIAGNOSIS — C61 Malignant neoplasm of prostate: Secondary | ICD-10-CM

## 2013-04-02 DIAGNOSIS — I6330 Cerebral infarction due to thrombosis of unspecified cerebral artery: Secondary | ICD-10-CM

## 2013-04-02 DIAGNOSIS — N289 Disorder of kidney and ureter, unspecified: Secondary | ICD-10-CM | POA: Diagnosis present

## 2013-04-02 LAB — URINALYSIS, ROUTINE W REFLEX MICROSCOPIC
Bilirubin Urine: NEGATIVE
Hgb urine dipstick: NEGATIVE
Nitrite: NEGATIVE
Specific Gravity, Urine: 1.011 (ref 1.005–1.030)
pH: 6.5 (ref 5.0–8.0)

## 2013-04-02 LAB — CBC WITH DIFFERENTIAL/PLATELET
HCT: 38.7 % — ABNORMAL LOW (ref 39.0–52.0)
Hemoglobin: 13.7 g/dL (ref 13.0–17.0)
Lymphocytes Relative: 29 % (ref 12–46)
Lymphs Abs: 2.6 10*3/uL (ref 0.7–4.0)
Monocytes Absolute: 1.1 10*3/uL — ABNORMAL HIGH (ref 0.1–1.0)
Monocytes Relative: 12 % (ref 3–12)
Neutro Abs: 4.6 10*3/uL (ref 1.7–7.7)
Neutrophils Relative %: 52 % (ref 43–77)
RBC: 4.31 MIL/uL (ref 4.22–5.81)

## 2013-04-02 LAB — COMPREHENSIVE METABOLIC PANEL
ALT: 14 U/L (ref 0–53)
AST: 16 U/L (ref 0–37)
Albumin: 3.5 g/dL (ref 3.5–5.2)
BUN: 27 mg/dL — ABNORMAL HIGH (ref 6–23)
CO2: 23 mEq/L (ref 19–32)
Chloride: 104 mEq/L (ref 96–112)
Creatinine, Ser: 1.1 mg/dL (ref 0.50–1.35)
Glucose, Bld: 116 mg/dL — ABNORMAL HIGH (ref 70–99)
Potassium: 4 mEq/L (ref 3.5–5.1)

## 2013-04-02 NOTE — Progress Notes (Signed)
Subjective/Complaints: Generally did well last night. Still with urinary incontinence  Objective: Vital Signs: Blood pressure 152/79, pulse 62, temperature 98.3 F (36.8 C), temperature source Oral, resp. rate 19, height 6\' 2"  (1.88 m), weight 88.542 kg (195 lb 3.2 oz), SpO2 98.00%. No results found.  Recent Labs  03/31/13 1630 04/02/13 0630  WBC 8.1 8.8  HGB 13.6 13.7  HCT 37.8* 38.7*  PLT 243 231    Recent Labs  04/02/13 0630  NA 137  K 4.0  CL 104  GLUCOSE 116*  BUN 27*  CREATININE 1.10  CALCIUM 9.9   CBG (last 3)  No results found for this basename: GLUCAP,  in the last 72 hours  Wt Readings from Last 3 Encounters:  04/02/13 88.542 kg (195 lb 3.2 oz)  03/29/13 90.266 kg (199 lb)    Physical Exam:  Constitutional: He is oriented to person, place, and time. He appears well-developed and well-nourished.  HENT: dentition intact. Oral mucosa pink and moist  Head: Normocephalic and atraumatic.  Eyes: Pupils are equal, round, and reactive to light.  Neck: Normal range of motion.  Cardiovascular: Normal rate and regular rhythm. SEM appreciated.  Pulmonary/Chest: Effort normal and breath sounds normal. No wheezes or rales.  Abdominal: Soft. Bowel sounds are normal. Non tender Neurological: He is alert and oriented to person, place, and time.  Mild dysarthria. Follows commands without difficulty. Left sided weakness with decreased fine motor coordination noted. No gross ataxia. LUE MMT grossly 4 to 4+/5. LLE is Grossly 4 to 4+ as well. No sensory deficits appreciated. Mild left central 7. Cognitively with good insight and awareness. STM intact.    Assessment/Plan: 1. Functional deficits secondary to right paracentral pontine infarct which require 3+ hours per day of interdisciplinary therapy in a comprehensive inpatient rehab setting. Physiatrist is providing close team supervision and 24 hour management of active medical problems listed below. Physiatrist and rehab  team continue to assess barriers to discharge/monitor patient progress toward functional and medical goals. FIM:                   Comprehension Comprehension Mode: Auditory Comprehension: 7-Follows complex conversation/direction: With no assist  Expression Expression Mode: Verbal Expression: 7-Expresses complex ideas: With no assist  Social Interaction Social Interaction: 7-Interacts appropriately with others - No medications needed.  Problem Solving Problem Solving: 6-Solves complex problems: With extra time  Memory Memory: 6-More than reasonable amt of time   Medical Problem List and Plan:  1. DVT Prophylaxis/Anticoagulation: Pharmaceutical: Lovenox  2. Pain Management: Continue tylenol prn for now.  3. Mood: Seems to be appropriate. Will have LCSW follow for evaluation.  4. Neuropsych: This patient Is capable of making decisions on his/her own behalf.  5. HTN: monitor BP with bid checks. Currently BP on low side--amlodipine-valsartan held. Continue Metoprolol.  6. Dyslipidemia: continue Zocor.  7. Pre Renal Insufficiency: sl improvement today. Push po fluids. Recheck later in the week..  8. Urgency with incontinence: Neurogenic bladder?/ sequelae of prostate cancer  -no PVR"s recorded  -UA negative, culture pending  -timed voids Denies urinary incontinence or frequency PTA despite prostate cancer hx      LOS (Days) 1 A FACE TO FACE EVALUATION WAS PERFORMED  SWARTZ,ZACHARY T 04/02/2013 8:25 AM

## 2013-04-02 NOTE — Evaluation (Signed)
Occupational Therapy Assessment and Plan  Patient Details  Name: Lucas Green MRN: [id number] Date of Birth: [birth year]  OT Diagnosis: hemiplegia affecting non-dominant side and muscle weakness (generalized) Rehab Potential: Rehab Potential: Good ELOS: 10 days   Today's Date: 04/02/2013 Time: 4098-1191 Time Calculation (min): 65 min  Problem List:  Patient Active Problem List  Diagnosis  . Hypertension  . Brainstem infarct, acute  . Other and unspecified hyperlipidemia  . Leukocytosis, unspecified  . Renal insufficiency    Past Medical History:  Past Medical History  Diagnosis Date  . Cancer     prostate  . Hypertension   . GERD (gastroesophageal reflux disease)    Past Surgical History:  Past Surgical History  Procedure Laterality Date  . 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 He stated he'd been feeling generally weak for about 3 days prior to admission. With the onset of his left side weakness on 03/29/2013 he also had slurred speech. He noticed slight numbness in his left upper extremity as well. There is no previous history of stroke nor TIA.Marland Kitchen MRI/MRA brain done revealing acute right infarct paracentral pontine infarct and right VA with occlusion likely chronic. 2D echo with EF 60-65% without wall abnormality. Carotid dopplers without ICA stenosis. Was changed to plavix for ischemic infarct due to SVD. Patient continues with mild dysarthria with left sided weakness. Patient transferred to CIR on 04/01/2013 .    Patient currently requires mod with basic self-care skills secondary to muscle weakness, impaired timing and sequencing, abnormal tone, unbalanced muscle activation and decreased coordination and decreased standing balance, decreased postural control and decreased balance  strategies.  Prior to hospitalization, patient could complete ADLs with independent .  Patient will benefit from skilled intervention to increase independence with basic self-care skills prior to discharge home with care partner.  Anticipate patient will require intermittent supervision and follow up outpatient.  OT - End of Session Activity Tolerance: Tolerates 30+ min activity without fatigue Endurance Deficit: No OT Assessment Rehab Potential: Good Barriers to Discharge: None OT Plan OT Intensity: Minimum of 1-2 x/day, 45 to 90 minutes OT Frequency: 5 out of 7 days OT Duration/Estimated Length of Stay: 10 days OT Treatment/Interventions: Metallurgist training;Community reintegration;Discharge planning;DME/adaptive equipment instruction;Functional mobility training;Neuromuscular re-education;Pain management;Patient/family education;Psychosocial support;Self Care/advanced ADL retraining;Skin care/wound managment;Therapeutic Activities;Therapeutic Exercise;UE/LE Strength taining/ROM;UE/LE Coordination activities OT Recommendation Patient destination: Home Follow Up Recommendations: Outpatient OT Equipment Recommended: Tub/shower seat   Skilled Therapeutic Intervention OT eval conducted, ADL assessment completed at sit <> stand level in walk-in shower, pt required steady assist with standing.  Pt required mod assist with ambulation without AD secondary to LLE weakness and decreased step length.  Pt quick to respond to hemi-technique and demonstrated appropriate coordination with tying shoes.  Pt's wife present throughout session and appears very supportive.  OT Evaluation Precautions/Restrictions  Precautions Precautions: Fall General   Vital Signs Therapy Vitals Pulse Rate: 80 Pain Pain Assessment Pain Assessment: No/denies pain Pain Score: 0-No pain Home Living/Prior Functioning Home Living Lives With: Spouse Available Help at Discharge: Available  PRN/intermittently Type of Home: House Home Access: Stairs to enter Secretary/administrator of Steps: 1 Entrance Stairs-Rails: None Home Layout: Multi-level;Able to live on main level with bedroom/bathroom Bathroom Shower/Tub: Walk-in shower;Door [employer] Toilet: Standard Home Adaptive Equipment: None Prior Function Level of Independence: Independent  with basic ADLs;Independent with gait;Independent with transfers Able to Take Stairs?: Yes Driving: Yes Vocation: Part time employment Comments: Physician in addiction medicine, 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, lived in 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 ADL ADL Grooming: Minimal assistance Where Assessed-Grooming: Standing at sink Upper Body Bathing: Minimal assistance Where Assessed-Upper Body Bathing: Public affairs consultant Bathing: Moderate assistance Where Assessed-Lower Body Bathing: Shower Upper Body Dressing: Setup;Minimal assistance Where Assessed-Upper Body Dressing: Chair Lower Body Dressing: Minimal assistance Where Assessed-Lower Body Dressing: Chair Toilet Transfer: Moderate assistance Toilet Transfer Method: Event organiser: Moderate assistance Film/video editor Method: Manufacturing systems engineer with back Vision/Perception  Vision - History Baseline Vision: Wears glasses only for reading Patient Visual Report: No change from baseline Vision - Assessment Eye Alignment: Within Functional Limits Perception Perception: Within Functional Limits Praxis Praxis: Intact  Cognition Overall Cognitive Status: Within Functional Limits for tasks assessed Orientation Level: Oriented X4 Sensation Sensation Light Touch: Appears Intact Stereognosis: Not tested Hot/Cold: Appears Intact Proprioception: Appears Intact Coordination Gross Motor Movements are Fluid and Coordinated: No Fine Motor Movements are Fluid and Coordinated: No Coordination and Movement Description: Limited by  weakness Finger Nose Finger Test: slight increased time with LUE Motor  Motor Motor: Hemiplegia;Abnormal postural alignment and control Motor - Skilled Clinical Observations: L hemiplegia, stands with weight posterior on heels Mobility  Bed Mobility Supine to Sit: 3: Mod assist;HOB flat Supine to Sit Details (indicate cue type and reason): Patient able to roll to L side but required mod A to sit fully upright and weight shift to R at EOB from L side secondary to LUE weakness  Trunk/Postural Assessment  Cervical Assessment Cervical Assessment: Exceptions to [facility] (Limited ROM; reports cervical OA) Thoracic Assessment Thoracic Assessment: Within Functional Limits Lumbar Assessment Lumbar Assessment: Exceptions to [facility] (limited ROM; reports low back OA) Postural Control Postural Control: Deficits on evaluation (maintains weight posterior)  Balance Static Sitting Balance Static Sitting - Balance Support: Feet supported Static Sitting - Level of Assistance: 7: Independent Dynamic Sitting Balance Dynamic Sitting - Balance Support: Right upper extremity supported;Left upper extremity supported;Feet supported Dynamic Sitting - Level of Assistance: 5: Stand by assistance Static Standing Balance Static Standing - Balance Support: No upper extremity supported Static Standing - Level of Assistance: 4: Min assist Dynamic Standing Balance Dynamic Standing - Balance Support: No upper extremity supported Dynamic Standing - Level of Assistance: 3: Mod assist Extremity/Trunk Assessment RUE Assessment RUE Assessment: Exceptions to [facility] RUE AROM (degrees) RUE Overall AROM Comments: Rt shoulder with limited AROM secondary to shoulder injury, approx 70 degrees shoulder flexion, strength WFL except shoulder LUE Assessment LUE Assessment: Exceptions to [facility] (strength grossly 4/5, increased time for AROM)  FIM:  FIM - Grooming Grooming Steps: Wash, rinse, dry face;Wash, rinse, dry hands;Oral care, brush  teeth, clean dentures;Brush, comb hair Grooming: 4: Steadying assist  or patient completes 3 of 4 or 4 of 5 steps FIM - Bathing Bathing Steps Patient Completed: Chest;Right Arm;Left Arm;Abdomen;Front perineal area;Buttocks;Right upper leg;Left upper leg Bathing: 4: Min-Patient completes 8-9 11f 10 parts or 75+ percent FIM - Upper Body Dressing/Undressing Upper body dressing/undressing steps patient completed: Thread/unthread right sleeve of pullover shirt/dresss;Thread/unthread left sleeve of pullover shirt/dress;Put head through opening of pull over shirt/dress;Pull shirt over trunk Upper body dressing/undressing: 4: Steadying assist FIM - Lower Body Dressing/Undressing Lower body dressing/undressing steps patient completed: Thread/unthread right underwear leg;Pull underwear up/down;Thread/unthread right pants leg;Thread/unthread left pants leg;Pull pants up/down;Don/Doff right sock;Don/Doff left sock;Don/Doff right shoe;Don/Doff left  shoe;Fasten/unfasten right shoe;Fasten/unfasten left shoe Lower body dressing/undressing: 3: Mod-Patient completed 50-74% of tasks FIM - Bed/Chair Transfer Bed/Chair Transfer: 3: Supine > Sit: Mod A (lifting assist/Pt. 50-74%/lift 2 legs;3: Bed > Chair or W/C: Mod A (lift or lower assist);3: Chair or W/C > Bed: Mod A (lift or lower assist) FIM - Tub/Shower Transfers Tub/Shower Assistive Devices: 3-in-1 commode;Grab bars;Walk in shower Tub/shower Transfers: 3-Into Tub/Shower: Mod A (lift or lower/lift 2 legs);3-Out of Tub/Shower: Mod A (lift or lower/lift 2 legs)   Refer to Care Plan for Long Term Goals  Recommendations for other services: None  Discharge Criteria: Patient will be discharged from OT if patient refuses treatment 3 consecutive times without medical reason, if treatment goals not met, if there is a change in medical status, if patient makes no progress towards goals or if patient is discharged from hospital.  The above assessment, treatment plan,  treatment alternatives and goals were discussed and mutually agreed upon: by patient and by family  Leonette Monarch 04/02/2013, 12:30 PM

## 2013-04-02 NOTE — Evaluation (Signed)
Physical Therapy Assessment and Plan  Patient Details  Name: Lucas Green MRN: [id number] Date of Birth: 14-Aug-1932  PT Diagnosis: Abnormality of gait, Difficulty walking, Dizziness and giddiness, Hemiplegia non-dominant, Muscle weakness and Osteoarthritis Rehab Potential: Excellent ELOS: 10-12 days   Today's Date: 04/02/2013 Time: 0900-1000 Time Calculation (min): 60 min  Problem List:  Patient Active Problem List  Diagnosis  . Hypertension  . Brainstem infarct, acute  . Other and unspecified hyperlipidemia  . Leukocytosis, unspecified  . Renal insufficiency    Past Medical History:  Past Medical History  Diagnosis Date  . Cancer     prostate  . Hypertension   . GERD (gastroesophageal reflux disease)    Past Surgical History:  Past Surgical History  Procedure Laterality Date  . 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 He stated he'd been feeling generally weak for about 3 days prior to admission. With the onset of his left side weakness on 03/29/2013 he also had slurred speech. He noticed slight numbness in his left upper extremity as well. There is no previous history of stroke nor TIA.Marland Kitchen MRI/MRA brain done revealing acute right infarct paracentral pontine infarct and right VA with occlusion likely chronic. 2D echo with EF 60-65% without wall abnormality. Carotid dopplers without ICA stenosis. Was changed to plavix for ischemic infarct due to SVD. Patient continues with mild dysarthria with left sided weakness.  Patient transferred to CIR on 04/01/2013 .   Patient currently requires mod with mobility secondary to muscle weakness, decreased coordination, dizziness and decreased standing balance, decreased postural control, hemiplegia and decreased balance strategies.  Prior to hospitalization, patient  was independent  with mobility and lived with Spouse (wife works) in [employer] home.  Home access is 1Stairs to enter.  Patient will benefit from skilled PT intervention to maximize safe functional mobility and minimize fall risk for planned discharge home with intermittent assist.  Anticipate patient will benefit from follow up OP at discharge.  PT - End of Session Activity Tolerance: Endurance does not limit participation in activity Endurance Deficit: No PT Assessment Rehab Potential: Excellent Barriers to Discharge: None PT Plan PT Intensity: Minimum of 1-2 x/day ,45 to 90 minutes PT Frequency: 5 out of 7 days PT Duration Estimated Length of Stay: 10-12 days PT Treatment/Interventions: Ambulation/gait training;Balance/vestibular training;Community reintegration;Discharge planning;Disease management/prevention;DME/adaptive equipment instruction;Functional mobility training;Neuromuscular re-education;Patient/family education;Stair training;Therapeutic Activities;Therapeutic Exercise;UE/LE Strength taining/ROM;UE/LE Coordination activities PT Recommendation Follow Up Recommendations: Outpatient PT Patient destination: Home Equipment Recommended: None recommended by PT  Skilled Therapeutic Intervention Following evaluation discussed with patient goals for rehab, expected outcomes, and estimated LOS.  Also discussed f/u therapy and equipment recommendations which are still TBD following BERG balance assessment and patient's progress.  Patient verbalized understanding.   Assisted patient with stand pivot w/c > recliner with mod A where patient requested to rest prior to B&D.  PT Evaluation Precautions/Restrictions Precautions Precautions: Fall Precaution Comments: Notify physician if increase in NIHSS score of 4 or more, patient c/o severe headache, develops nausea/vomiting, pulse less than 50 or greater than 120, systolic BP less than 100 or greater than or equal to 180, diastolic BP less  than 70 or greater than or equal to 105, respiratory rate greater than 30, temperature greater than 101.0 F, blood glucose greater than 180 or less than 60 General Chart Reviewed:  Yes Response to Previous Treatment: Patient with no complaints from previous session. 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  Perception Perception: Within Functional Limits Praxis Praxis: Intact  Cognition Overall Cognitive Status: Within Functional Limits for tasks assessed Sensation Sensation Light Touch: Appears Intact Stereognosis: Not tested Hot/Cold: Not tested Proprioception: Appears Intact Coordination Gross Motor Movements are Fluid and Coordinated: No Coordination and Movement Description: Limited by weakness Motor  Motor Motor: Hemiplegia;Abnormal postural alignment and control Motor - Skilled Clinical Observations: L hemiplegia, stands with weight posterior on heels  Mobility Bed Mobility Supine to Sit: 3: Mod assist;HOB flat Supine to Sit Details (indicate cue type and reason): Patient able to roll to L side but required mod A to sit fully upright and weight shift to R at EOB from L side secondary to LUE weakness Transfers Stand Pivot Transfers: 3: Mod assist;From elevated surface;With armrests Stand Pivot Transfer Details (indicate cue  type and reason): Required mod A for lifting assistance to stand from bed, w/c or mat with use of UE and assistance to stabilize when first standing secondary to weight being posterior on heels.  Patient required mod A to balance and assist with weight shifting during pivot. Locomotion  Ambulation Ambulation/Gait Assistance: 3: Mod assist Ambulation Distance (Feet): 50 Feet (x 2) Assistive device: None;1 person hand held assist Ambulation/Gait Assistance Details: Required mod A for gait without UE support x 50' x 2 with assistance for lateral and anterior weight shifting and cues for upright trunk; as patient progressed patient felt more confident to increase L stance time and increase step and stride length overall but still will decreased functional gait velocity Gait Gait: Yes Gait Pattern: Step-to pattern;Decreased step length - right;Decreased step length - left;Decreased stance time - left;Decreased stride length;Narrow base of support;Left flexed knee in stance Stairs / Additional Locomotion Stairs: Yes Stairs Assistance: 3: Mod assist Stairs Assistance Details (indicate cue type and reason): Performed stairs with use of bilat UE support on rails.  Patient educated on safe sequence ascending with strong LE, descending with weaker LLE.  Patient attempted to ascend with LLE but unable to fully advance weight to next step without mod A.   Stair Management Technique: Two rails;Step to pattern;Forwards Number of Stairs: 5 Height of Stairs: 6 Wheelchair Mobility Wheelchair Mobility: Yes Wheelchair Assistance: 4: Systems analyst: Right lower extremity Wheelchair Parts Management: Needs assistance Distance: 100; required cues and assistance for UE propulsion sequence to maintain straight navigation; limited ability to propel secondary to R shoulder OA and limited ROM  Trunk/Postural Assessment  Cervical Assessment Cervical Assessment: Exceptions to [facility] (Limited ROM; reports  cervical OA) Thoracic Assessment Thoracic Assessment: Within Functional Limits Lumbar Assessment Lumbar Assessment: Exceptions to WFL (limited ROM; reports low back OA) Postural Control Postural Control: Deficits on evaluation (maintains weight posterior)  Balance Static Sitting Balance Static Sitting - Balance Support: Feet supported Static Sitting - Level of Assistance: 7: Independent Dynamic Sitting Balance Dynamic Sitting - Balance Support: Right upper extremity supported;Left upper extremity supported;Feet  supported Dynamic Sitting - Level of Assistance: 5: Stand by assistance Static Standing Balance Static Standing - Balance Support: No upper extremity supported Static Standing - Level of Assistance: 4: Min assist Dynamic Standing Balance Dynamic Standing - Balance Support: No upper extremity supported Dynamic Standing - Level of Assistance: 3: Mod assist Extremity Assessment  RLE Assessment RLE Assessment: Within Functional Limits LLE Assessment LLE Assessment: Exceptions to [facility] LLE Strength LLE Overall Strength: Deficits LLE Overall Strength Comments: 3/5 hip flexion, 4-/5 knee flexion, extension and ankle DF; poor muscular endurance during gait/WB  FIM:  FIM - Bed/Chair Transfer Bed/Chair Transfer: 3: Supine > Sit: Mod A (lifting assist/Pt. 50-74%/lift 2 legs;3: Bed > Chair or W/C: Mod A (lift or lower assist);3: Chair or W/C > Bed: Mod A (lift or lower assist) FIM - Locomotion: Wheelchair Distance: 100; required cues and assistance for UE propulsion sequence to maintain straight navigation; limited ability to propel secondary to R shoulder OA and limited ROM Locomotion: Wheelchair: 1: Travels less than 50 ft with minimal assistance (Pt.>75%) FIM - Locomotion: Ambulation Ambulation/Gait Assistance: 3: Mod assist Locomotion: Ambulation: 2: Travels 50 - 149 ft with moderate assistance (Pt: 50 - 74%) FIM - Locomotion: Stairs Locomotion: Building control surveyor: Hand rail  - 2 Locomotion: Stairs: 2: Up and Down 4 - 11 stairs with moderate assistance (Pt: 50 - 74%)   Refer to Care Plan for Long Term Goals  Recommendations for other services: None  Discharge Criteria: Patient will be discharged from PT if patient refuses treatment 3 consecutive times without medical reason, if treatment goals not met, if there is a change in medical status, if patient makes no progress towards goals or if patient is discharged from hospital.  The above assessment, treatment plan, treatment alternatives and goals were discussed and mutually agreed upon: by patient  Edman Circle Faucette 04/02/2013, 10:42 AM

## 2013-04-02 NOTE — Progress Notes (Signed)
Pt admitted to unit at 1845. Discussed rehab process and reviewed booklet with safety plan signed with verbal understanding. Pt resting in chair with no complaints of pain

## 2013-04-02 NOTE — Plan of Care (Signed)
Overall Plan of Care [facility]) Patient Details Name: Lucas Green MRN: [id number] DOB: 06/10/1932  Diagnosis:  Right paracentral pontine infarct  Co-morbidities: Cancer  prostate  .  Hypertension  .  GERD (gastroesophageal reflux disease)    Functional Problem List  Patient demonstrates impairments in the following areas: Balance, Bladder, Bowel, Medication Management, Motor and Pain  Basic ADL's: grooming, bathing, dressing and toileting Advanced ADL's: simple meal preparation  Transfers:  bed mobility, bed to chair, toilet, tub/shower, car and floor Locomotion:  ambulation and stairs  Additional Impairments:  Functional use of upper extremity  Anticipated Outcomes Item Anticipated Outcome  Eating/Swallowing  Mod I   Basic self-care  Mod I  Tolieting  Mod I  Bowel/Bladder  Continent of B/B.  Transfers  Mod I  Locomotion  Mod I  Communication  Mod I   Cognition  Mod I   Pain  Less than or equal to 3.  Safety/Judgment    Other     Therapy Plan: PT Intensity: Minimum of 1-2 x/day ,45 to 90 minutes PT Frequency: 5 out of 7 days PT Duration Estimated Length of Stay: 10-12 days OT Intensity: Minimum of 1-2 x/day, 45 to 90 minutes OT Frequency: 5 out of 7 days OT Duration/Estimated Length of Stay: 10 days      Team Interventions: Item RN PT OT SLP SW TR Other  Self Care/Advanced ADL Retraining   x      Neuromuscular Re-Education  x x      Therapeutic Activities  x x      UE/LE Strength Training/ROM  x x      UE/LE Coordination Activities  x x      Visual/Perceptual Remediation/Compensation         DME/Adaptive Equipment Instruction  x x      Therapeutic Exercise  x x      Balance/Vestibular Training  x x      Patient/Family Education x x x      Cognitive Remediation/Compensation         Functional Mobility Training  x x      Ambulation/Gait Training  x       Programmer, systems Reintegration  x x      Dysphagia/Aspiration Film/video editor         Bladder Management x        Bowel Management x        Disease Management/Prevention x x x      Pain Management x  x      Medication Management x        Skin Care/Wound Management         Splinting/Orthotics         Discharge Planning  x x      Psychosocial Support x  x                             Team Discharge Planning: Destination: PT-Home ,OT- Home , SLP-Home Projected Follow-up: PT-Outpatient PT, OT-  Outpatient OT, SLP-None;Other (comment) (Outpatient ENT if problem does not resolve) Projected Equipment Needs: PT-None recommended by PT, OT- Tub/shower seat, SLP-None recommended by SLP Patient/family involved in discharge planning: PT- Patient,  OT-Patient;Family member/caregiver, SLP-Patient;Family member/caregiver  MD ELOS: 7010 days Medical Rehab Prognosis:  Excellent Assessment: 79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 Further workup revealed acute right paracentral pontine infarct. Now requires 24 7 rehabilitation RN and M.D. As well as CIR level PT OT. Treatment team will focus on ADLs and mobility as well as balance and safety. Goals are for modified independent level with all mobility and ADLs.    See Team Conference Notes for weekly updates to the plan of care

## 2013-04-02 NOTE — Evaluation (Signed)
Speech Language Pathology Assessment and Plan  Patient Details  Name: Lucas Green MRN: [id number] Date of Birth: November 09, 1932  SLP Diagnosis:  mild vocal changes Rehab Potential:  defer to OT/PT ELOS:   defer to OT/PT  Today's Date: 04/02/2013 Time: 1191-4782 Time Calculation (min): 30 min  Problem List:  Patient Active Problem List  Diagnosis  . Hypertension  . Brainstem infarct, acute  . Other and unspecified hyperlipidemia  . Leukocytosis, unspecified  . Renal insufficiency   Past Medical History:  Past Medical History  Diagnosis Date  . Cancer     prostate  . Hypertension   . GERD (gastroesophageal reflux disease)    Past Surgical History:  Past Surgical History  Procedure Laterality Date  . 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 He stated he'd been feeling generally weak for about 3 days prior to admission. With the onset of his left side weakness on 03/29/2013 he also had slurred speech. He noticed slight numbness in his left upper extremity as well. There is no previous history of stroke nor TIA. MRI/MRA brain done revealing acute right infarct paracentral pontine infarct and right VA with occlusion likely chronic. 2D echo with EF 60-65% without wall abnormality. Carotid dopplers without ICA stenosis. Was changed to plavix for ischemic infarct due to SVD. Patient continues with mild dysarthria with left sided weakness. PT/OT/ST evaluations done and are recommending CIR for progressive therapies. Patient admitted 04/01/13 and upon evaluation today presents with mild vocal changes characterized by decreased quality, pitch, loudness and voicing endurance.  Patient and wife concerned regarding changes and previous diagnosis of vocal fold edema.  As a result, SLP provided  education regarding implementation of a vocal hygiene program to promote improved vocal quality.  Patient participated in teach back and verbalized understanding of information.  As a result, no further skilled SLP services a warranted at this time.  If follow-up is warranted it is recommended that he follow up with an ENT as an outpatient.        SLP Assessment  Patient does not need any further Speech Lanaguage Pathology Services    Recommendations  Patient destination: Home Follow up Recommendations: None;Other (comment) (Outpatient ENT if problem does not resolve) Equipment Recommended: None recommended by SLP               Pain Pain Assessment Pain Assessment: No/denies pain Pain Score: 0-No pain Prior Functioning Cognitive/Linguistic Baseline: Within functional limits Type of Home: House Lives With: Spouse Available Help at Discharge: Available PRN/intermittently Vocation: Part time employment   See FIM for current functional status   Recommendations for other services: None  Discharge Criteria: Patient will be discharged from SLP if patient refuses treatment 3 consecutive times without medical reason, if treatment goals not met, if there is a change in medical status, if patient makes no progress towards goals or if patient is discharged from hospital.  The above assessment, treatment plan, treatment alternatives and goals were discussed and mutually agreed upon: by patient and by family  Charlane Ferretti., CCC-SLP [phone]  Reham Slabaugh 04/02/2013, 1:58 PM

## 2013-04-02 NOTE — Progress Notes (Signed)
Physical Therapy Session Note  Patient Details  Name: Lucas Green MRN: [id number] Date of Birth: [birth year]  Today's Date: 04/02/2013 Time: 1191-4782 Time Calculation (min): 30 min  Short Term Goals: Week 1:  PT Short Term Goal 1 (Week 1): = LTG  Therapy Documentation Precautions:  Precautions Precautions: Fall Precaution Comments: Notify physician if increase in NIHSS score of 4 or more, patient c/o severe headache, develops nausea/vomiting, pulse less than 50 or greater than 120, systolic BP less than 100 or greater than or equal to 180, diastolic BP less than 70 or greater than or equal to 105, respiratory rate greater than 30, temperature greater than 101.0 F, blood glucose greater than 180 or less than 60 Restrictions Weight Bearing Restrictions: No Vital Signs: Therapy Vitals Temp: 97.9 F (36.6 C) Temp src: Oral Pulse Rate: 74 Resp: 18 BP: 125/68 mmHg Patient Position, if appropriate: Lying Oxygen Therapy SpO2: 98 % O2 Device: None (Room air) Pain: Pain Assessment Pain Assessment: No/denies pain Pain Score: 0-No pain Mobility:  Patient performed sit <> supine with rail supervision; performed stand pivot bed <> w/c with UE support on arm rests and min A overall still with significant posterior lean and difficulty with lateral weight shifting and maintaining balance during rotation during pivoting and single limb stance Balance: Standardized Balance Assessment Standardized Balance Assessment: Berg Balance Test Berg Balance Test Sit to Stand: Able to stand  independently using hands Standing Unsupported: Able to stand 2 minutes with supervision Sitting with Back Unsupported but Feet Supported on Floor or Stool: Able to sit safely and securely 2 minutes Stand to Sit: Sits independently, has uncontrolled descent Transfers: Needs one person to assist Standing Unsupported with Eyes Closed: Able to stand 10 seconds with supervision Standing Ubsupported with Feet  Together: Able to place feet together independently and stand for 1 minute with supervision From Standing, Reach Forward with Outstretched Arm: Can reach forward >12 cm safely (5") From Standing Position, Pick up Object from Floor: Able to pick up shoe, needs supervision From Standing Position, Turn to Look Behind Over each Shoulder: Needs supervision when turning Turn 360 Degrees: Needs close supervision or verbal cueing Standing Unsupported, Alternately Place Feet on Step/Stool: Able to complete >2 steps/needs minimal assist Standing Unsupported, One Foot in Front: Able to take small step independently and hold 30 seconds Standing on One Leg: Unable to try or needs assist to prevent fall Total Score: 29 Patient demonstrates increased fall risk as noted by score of 29/56 on Berg Balance Scale.  (<36= high risk for falls, close to 100%; 37-45 significant >80%; 46-51 moderate >50%; 52-55 lower >25%); discussed falls risk with patient and areas to focus on.  Also discussed use of call bell and staff assistance during mobility to minimize falls risk on unit.  Patient verbalized understanding.    See FIM for current functional status  Therapy/Group: Individual Therapy  Edman Circle [facility] 04/02/2013, 3:15 PM

## 2013-04-03 ENCOUNTER — Inpatient Hospital Stay (HOSPITAL_COMMUNITY): Payer: Medicare Other | Admitting: Physical Therapy

## 2013-04-03 ENCOUNTER — Inpatient Hospital Stay (HOSPITAL_COMMUNITY): Payer: Medicare Other | Admitting: Occupational Therapy

## 2013-04-03 LAB — URINE CULTURE

## 2013-04-03 NOTE — Progress Notes (Signed)
Occupational Therapy Session Note  Patient Details  Name: Lucas Green MRN: [id number] Date of Birth: 02/23/1932  Today's Date: 04/03/2013 Time: 0730-0827 and 4098-1191 Time Calculation (min): 57 min and 47 min  Short Term Goals: Week 1:  OT Short Term Goal 1 (Week 1): STG = LTGs due to ELOS, Mod I overall  Skilled Therapeutic Interventions/Progress Updates:    1) Pt seen for ADL retraining with focus on functional mobility without AD, gathering items prior to bathing, and bathing and dressing at sit <> stand level.  Pt requires min-mod assist with ambulation secondary to LLE weakness.  Pt requires increased time with all self-care tasks with frequent c/o fatigue.  Educated on use of shower chair for bathing when washing feet secondary to decreased single leg stance.  Pt with increased stability with dressing this session secondary to carryover of hemi-technique and sit to stand for LB dressing.  Pt returned to bed secondary to fatigue, with call bell and phone in reach.  2) Pt seen for 1:1 OT with focus on functional mobility, transfers, and dynamic standing balance.  Pt in bed upon arrival, stand pivot transfer to w/c with min assist.  Pt propelled w/c to therapy gym with BUE and BLE.  Engaged in standing task at mirror with focus on weight shifting and dynamic balance reactions.  Use of dynamic foam surface to challenge balance reactions with reaching outside BOS.  Pt hesitant to shift weight to Lt.  Pt reports needing to toilet, propelled w/c back to room and ambulated to toilet with min assist.  Pt supervision standing from low toilet, ambulated back to bed with min assist.  Therapy Documentation Precautions:  Precautions Precautions: Fall Precaution Comments: Notify physician if increase in NIHSS score of 4 or more, patient c/o severe headache, develops nausea/vomiting, pulse less than 50 or greater than 120, systolic BP less than 100 or greater than or equal to 180, diastolic BP less  than 70 or greater than or equal to 105, respiratory rate greater than 30, temperature greater than 101.0 F, blood glucose greater than 180 or less than 60 Restrictions Weight Bearing Restrictions: No General:   Vital Signs: Therapy Vitals Temp: 97.8 F (36.6 C) Temp src: Oral Pulse Rate: 60 Resp: 17 BP: 130/72 mmHg Oxygen Therapy SpO2: 99 % O2 Device: None (Room air) Pain:  Pt with no c/o pain this session.  See FIM for current functional status  Therapy/Group: Individual Therapy  Leonette Monarch 04/03/2013, 8:30 AM

## 2013-04-03 NOTE — Progress Notes (Signed)
Patient ID: Lucas Green, male   DOB: July 10, 1932, 79 y.o.   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 He stated he'd been feeling generally weak for about 3 days prior to admission. With the onset of his left side weakness on 03/29/2013 he also had slurred speech. He noticed slight numbness in his left upper extremity as well. There is no previous history of stroke nor TIA.Marland Kitchen MRI/MRA brain done revealing acute right infarct paracentral pontine infarct and right VA with occlusion likely chronic. 2D echo with EF 60-65% without wall abnormality. Carotid dopplers without ICA stenosis. Was changed to plavix for ischemic infarct due to SVD Urinary freq, small voids  Review of Systems  Genitourinary: Positive for urgency and frequency. Negative for dysuria.  Neurological: Negative for sensory change.  All other systems reviewed and are negative.   Objective: Vital Signs: Blood pressure 130/72, pulse 60, temperature 97.8 F (36.6 C), temperature source Oral, resp. rate 17, height 6\' 2"  (1.88 m), weight 88.542 kg (195 lb 3.2 oz), SpO2 99.00%. No results found.  Recent Labs  03/31/13 1630 04/02/13 0630  WBC 8.1 8.8  HGB 13.6 13.7  HCT 37.8* 38.7*  PLT 243 231    Recent Labs  04/02/13 0630  NA 137  K 4.0  CL 104  GLUCOSE 116*  BUN 27*  CREATININE 1.10  CALCIUM 9.9   CBG (last 3)  No results found for this basename: GLUCAP,  in the last 72 hours  Wt Readings from Last 3 Encounters:  04/02/13 88.542 kg (195 lb 3.2 oz)  03/29/13 90.266 kg (199 lb)    Physical Exam:  Constitutional: He is oriented to person, place, and time. He appears well-developed and well-nourished.  HENT: dentition intact. Oral mucosa pink and moist  Head: Normocephalic and atraumatic.  Eyes: Pupils are equal, round, and reactive to light.  Neck: Normal range of  motion.  Cardiovascular: Normal rate and regular rhythm. SEM appreciated.  Pulmonary/Chest: Effort normal and breath sounds normal. No wheezes or rales.  Abdominal: Soft. Bowel sounds are normal. Non tender Neurological: He is alert and oriented to person, place, and time.  Mild dysarthria. Follows commands without difficulty. Left sided weakness with decreased fine motor coordination noted. No gross ataxia. LUE MMT grossly 4 to 4+/5. LLE is Grossly 4 to 4+ as well. No sensory deficits appreciated. Mild left central 7. Cognitively with good insight and awareness. STM intact.  Minimal dysmetria L FNF  Assessment/Plan: 1. Functional deficits secondary to right paracentral pontine infarct which require 3+ hours per day of interdisciplinary therapy in a comprehensive inpatient rehab setting. Physiatrist is providing close team supervision and 24 hour management of active medical problems listed below. Physiatrist and rehab team continue to assess barriers to discharge/monitor patient progress toward functional and medical goals. FIM: FIM - Bathing Bathing Steps Patient Completed: Chest;Right Arm;Left Arm;Abdomen;Front perineal area;Buttocks;Right upper leg;Left upper leg Bathing: 4: Min-Patient completes 8-9 75f 10 parts or 75+ percent  FIM - Upper Body Dressing/Undressing Upper body dressing/undressing steps patient completed: Thread/unthread right sleeve of pullover shirt/dresss;Thread/unthread left sleeve of pullover shirt/dress;Put head through opening of pull over shirt/dress;Pull shirt over trunk Upper body dressing/undressing: 5: Set-up assist to: Obtain clothing/put away FIM - Lower Body Dressing/Undressing Lower body dressing/undressing steps patient completed: Thread/unthread right pants leg;Thread/unthread left pants leg;Pull pants up/down;Don/Doff right shoe;Don/Doff left shoe Lower body dressing/undressing: 4: Steadying Assist  FIM - Toileting Toileting steps completed by patient:  Adjust clothing prior to toileting;Performs perineal hygiene;Adjust clothing after toileting Toileting Assistive Devices: Grab bar or rail for support Toileting: 4: Steadying assist  FIM - Diplomatic Services operational officer Devices: Grab bars Toilet Transfers: 3-To toilet/BSC: Mod A (lift or lower assist);3-From toilet/BSC: Mod A (lift or lower assist)  FIM - Bed/Chair Transfer Bed/Chair Transfer: 5: Supine > Sit: Supervision (verbal cues/safety issues);3: Bed > Chair or W/C: Mod A (lift or lower assist);3: Chair or W/C > Bed: Mod A (lift or lower assist)  FIM - Locomotion: Wheelchair Distance: 100; required cues and assistance for UE propulsion sequence to maintain straight navigation; limited ability to propel secondary to R shoulder OA and limited ROM Locomotion: Wheelchair: 1: Travels less than 50 ft with minimal assistance (Pt.>75%) FIM - Locomotion: Ambulation Ambulation/Gait Assistance: 3: Mod assist Locomotion: Ambulation: 2: Travels 50 - 149 ft with moderate assistance (Pt: 50 - 74%)  Comprehension Comprehension Mode: Auditory Comprehension: 6-Follows complex conversation/direction: With extra time/assistive device  Expression Expression Mode: Verbal Expression: 7-Expresses complex ideas: With no assist  Social Interaction Social Interaction: 7-Interacts appropriately with others - No medications needed.  Problem Solving Problem Solving: 6-Solves complex problems: With extra time  Memory Memory: 6-More than reasonable amt of time   Medical Problem List and Plan:  1. DVT Prophylaxis/Anticoagulation: Pharmaceutical: Lovenox  2. Pain Management: Continue tylenol prn for now.  3. Mood: Seems to be appropriate. Will have LCSW follow for evaluation.  4. Neuropsych: This patient Is capable of making decisions on his/her own behalf.  5. HTN: monitor BP with bid checks. Currently BP on low side--amlodipine-valsartan held. Continue Metoprolol.  6. Dyslipidemia:  continue Zocor.  7. Pre Renal Insufficiency: sl improvement today. Push po fluids. Recheck later in the week..  8. Urgency with incontinence: Neurogenic bladder, UMN -no PVR"s recorded  -UA negative, culture neg  -timed voids Denies urinary incontinence or frequency PTA despite prostate cancer hx      LOS (Days) 2 A FACE TO FACE EVALUATION WAS PERFORMED  Claudette Laws E 04/03/2013 8:55 AM

## 2013-04-03 NOTE — Progress Notes (Signed)
Physical Therapy Session Note  Patient Details  Name: Lucas Green MRN: [id number] Date of Birth: 04-04-1932  Today's Date: 04/03/2013 Time: 8295-6213 Time Calculation (min): 85 min  Short Term Goals: Week 1:  PT Short Term Goal 1 (Week 1): = LTG  Skilled Therapeutic Interventions/Progress Updates:    This session focused on bed mobility in ADL apartment and in room with supervision (verbal cues and increased time needed).  Transfers bed to WC min assist with quad cane.  WC mobility 150' with supervision bil arms and legs.  Car transfer with min assist to get up from [facility] to get into car.  He was able to get out of the car without physical assistance.  Pt needed verbal cues for safer car transfer technique.  Gait with quad cane 75' x 2 up to mod assist for LOB while turning corners and with fatigue.  Pt needed max verbal cues for upright posture and quad cane use.  Stairs min assist bil rails 3 steps x 2 once leading with right foot and once leading with left foot step to pattern.  Furniture transfer training in ADL apartment min assist with education on choices of sitting surfaces.  Gait over carpet in ADL apartment up to mod assist for LOB with quad cane, verbal cues to increase foot clearance.  Standing exercises: hip abduction, marches, heel and toe raises x 10 each bil.  Seated exercises: heel and toe raises, LAQ, seated marches, hip adduction against ball, hip abduct with blue t-band x 10 each bil.  NU step level 5 bil upper and lower extremities x 5 mins.    Therapy Documentation Precautions:  Precautions Precautions: Fall Precaution Comments: Notify physician if increase in NIHSS score of 4 or more, patient c/o severe headache, develops nausea/vomiting, pulse less than 50 or greater than 120, systolic BP less than 100 or greater than or equal to 180, diastolic BP less than 70 or greater than or equal to 105, respiratory rate greater than 30, temperature greater than 101.0 F, blood glucose  greater than 180 or less than 60 Restrictions Weight Bearing Restrictions: No    Pain: Pain Assessment Pain Assessment: No/denies pain Locomotion : Ambulation Ambulation/Gait Assistance: 3: Mod assist Wheelchair Mobility Distance: 150   See FIM for current functional status  Therapy/Group: Individual Therapy  Lurena Joiner B. Raphaela Cannaday, PT, DPT [phone]   04/03/2013, 12:30 PM

## 2013-04-04 ENCOUNTER — Inpatient Hospital Stay (HOSPITAL_COMMUNITY): Payer: Medicare Other | Admitting: Occupational Therapy

## 2013-04-04 ENCOUNTER — Ambulatory Visit (HOSPITAL_COMMUNITY): Payer: PRIVATE HEALTH INSURANCE | Admitting: Physical Therapy

## 2013-04-04 ENCOUNTER — Inpatient Hospital Stay (HOSPITAL_COMMUNITY): Payer: Medicare Other | Admitting: Physical Therapy

## 2013-04-04 NOTE — Progress Notes (Signed)
Patient ID: Lucas Green, male   DOB: [birth year], 79 y.o.   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 He stated he'd been feeling generally weak for about 3 days prior to admission. With the onset of his left side weakness on 03/29/2013 he also had slurred speech. He noticed slight numbness in his left upper extremity as well. There is no previous history of stroke nor TIA.Marland Kitchen MRI/MRA brain done revealing acute right infarct paracentral pontine infarct and right VA with occlusion likely chronic. 2D echo with EF 60-65% without wall abnormality. Carotid dopplers without ICA stenosis. Was changed to plavix for ischemic infarct due to SVD Urinary freq, small voids Has incont but no dysuria  Review of Systems  Genitourinary: Positive for urgency and frequency. Negative for dysuria.  Neurological: Negative for sensory change.  All other systems reviewed and are negative.   Objective: Vital Signs: Blood pressure 154/67, pulse 61, temperature 97.3 F (36.3 C), temperature source Oral, resp. rate 20, height 6\' 2"  (1.88 m), weight 89.767 kg (197 lb 14.4 oz), SpO2 96.00%. No results found.  Recent Labs  04/02/13 0630  WBC 8.8  HGB 13.7  HCT 38.7*  PLT 231    Recent Labs  04/02/13 0630  NA 137  K 4.0  CL 104  GLUCOSE 116*  BUN 27*  CREATININE 1.10  CALCIUM 9.9   CBG (last 3)  No results found for this basename: GLUCAP,  in the last 72 hours  Wt Readings from Last 3 Encounters:  04/03/13 89.767 kg (197 lb 14.4 oz)  03/29/13 90.266 kg (199 lb)    Physical Exam:  Constitutional: He is oriented to person, place, and time. He appears well-developed and well-nourished.  HENT: dentition intact. Oral mucosa pink and moist  Head: Normocephalic and atraumatic.  Eyes: Pupils are equal, round, and reactive to light.  Neck: Normal range of motion.   Cardiovascular: Normal rate and regular rhythm. SEM appreciated.  Pulmonary/Chest: Effort normal and breath sounds normal. No wheezes or rales.  Abdominal: Soft. Bowel sounds are normal. Non tender Neurological: He is alert and oriented to person, place, and time.  Mild dysarthria. Follows commands without difficulty. Left sided weakness with decreased fine motor coordination noted. No gross ataxia. LUE MMT grossly 4 to 4+/5. LLE is Grossly 4 to 4+ as well. No sensory deficits appreciated. Mild left central 7. Cognitively with good insight and awareness. STM intact.  Minimal dysmetria L FNF  Assessment/Plan: 1. Functional deficits secondary to right paracentral pontine infarct which require 3+ hours per day of interdisciplinary therapy in a comprehensive inpatient rehab setting. Physiatrist is providing close team supervision and 24 hour management of active medical problems listed below. Physiatrist and rehab team continue to assess barriers to discharge/monitor patient progress toward functional and medical goals. FIM: FIM - Bathing Bathing Steps Patient Completed: Chest;Right Arm;Left Arm;Abdomen;Front perineal area;Buttocks;Right upper leg;Left upper leg Bathing: 4: Min-Patient completes 8-9 43f 10 parts or 75+ percent  FIM - Upper Body Dressing/Undressing Upper body dressing/undressing steps patient completed: Thread/unthread right sleeve of pullover shirt/dresss;Thread/unthread left sleeve of pullover shirt/dress;Put head through opening of pull over shirt/dress;Pull shirt over trunk Upper body dressing/undressing: 5: Set-up assist to: Obtain clothing/put away FIM - Lower Body Dressing/Undressing Lower body dressing/undressing steps patient completed: Thread/unthread right pants leg;Thread/unthread left pants leg;Pull pants up/down;Don/Doff right shoe;Don/Doff left shoe Lower body dressing/undressing: 4: Steadying Assist  FIM -  Toileting Toileting steps completed by patient: Adjust  clothing prior to toileting;Performs perineal hygiene;Adjust clothing after toileting Toileting Assistive Devices: Grab bar or rail for support Toileting: 4: Steadying assist  FIM - Diplomatic Services operational officer Devices: Grab bars Toilet Transfers: 3-To toilet/BSC: Mod A (lift or lower assist);3-From toilet/BSC: Mod A (lift or lower assist)  FIM - Press photographer Assistive Devices: Cane;Bed rails;Arm rests Bed/Chair Transfer: 5: Supine > Sit: Supervision (verbal cues/safety issues);5: Sit > Supine: Supervision (verbal cues/safety issues);4: Bed > Chair or W/C: Min A (steadying Pt. > 75%);4: Chair or W/C > Bed: Min A (steadying Pt. > 75%)  FIM - Locomotion: Wheelchair Distance: 150 Locomotion: Wheelchair: 5: Travels 150 ft or more: maneuvers on rugs and over door sills with supervision, cueing or coaxing FIM - Locomotion: Ambulation Locomotion: Ambulation Assistive Devices: Occupational hygienist Ambulation/Gait Assistance: 3: Mod assist Locomotion: Ambulation: 2: Travels 50 - 149 ft with moderate assistance (Pt: 50 - 74%)  Comprehension Comprehension Mode: Auditory Comprehension: 6-Follows complex conversation/direction: With extra time/assistive device  Expression Expression Mode: Verbal Expression: 6-Expresses complex ideas: With extra time/assistive device  Social Interaction Social Interaction: 7-Interacts appropriately with others - No medications needed.  Problem Solving Problem Solving: 6-Solves complex problems: With extra time  Memory Memory: 6-More than reasonable amt of time   Medical Problem List and Plan:  1. DVT Prophylaxis/Anticoagulation: Pharmaceutical: Lovenox  2. Pain Management: Continue tylenol prn for now.  3. Mood: Seems to be appropriate. Will have LCSW follow for evaluation.  4. Neuropsych: This patient Is capable of making decisions on his/her own behalf.  5. HTN: monitor BP with bid checks. Currently BP on low  side--amlodipine-valsartan held. Continue Metoprolol.  6. Dyslipidemia: continue Zocor.  7. Pre Renal Insufficiency: sl improvement today. Push po fluids. Recheck later in the week..  8. Urgency with incontinence: Neurogenic bladder, UMN -no PVR"s recorded  -UA negative, culture neg  -timed voids, offered oxybutnin but pt wants to hold off Denies urinary incontinence or frequency PTA despite prostate cancer hx      LOS (Days) 3 A FACE TO FACE EVALUATION WAS PERFORMED  Erick Colace 04/04/2013 8:34 AM

## 2013-04-04 NOTE — Progress Notes (Signed)
Physical Therapy Note  Patient Details  Name: Makhi Muzquiz MRN: [id number] Date of Birth: 1932-11-14 Today's Date: 04/04/2013  Time: 1430-1530 60 minutes Group therapy  No c/o pain.  Pt participated in group therapy session for gait and standing balance.  Pt able to gait with quad cane at min A level in controlled and household environment, requires min A for standing balance without AD with reaching tasks.  Individual therapy   Fleur Audino 04/04/2013, 4:12 PM

## 2013-04-04 NOTE — Progress Notes (Signed)
Physical Therapy Session Note  Patient Details  Name: Lucas Green MRN: [id number] Date of Birth: Mar 05, 1932  Today's Date: 04/04/2013 Time: 4098-1191 Time Calculation (min): 41 min  Short Term Goals: Week 1:  PT Short Term Goal 1 (Week 1): = LTG  Skilled Therapeutic Interventions/Progress Updates:    This session focused on gait with quad can min assist 150' x 2.  Min assist needed for LOB during gait usually when he tries to look up and stand tall during gait.  Verbal cues needed for gait sequencing, upright gaze and posture.  Balance activities: semi tandem, feet together, feet apart, eyes open, closed.  Standing on foam eyes open.  Cone taps bil hands and single hand.  Biodex postural sway, LOS, and maze x 2 each working on weight shifting.  Pt tends to lean anteriorly and to the right.  Spoke with pt about different assistive devices including RW due to balance deficits and what would be safest. Pt would like to do cane if at all possible.    Therapy Documentation Precautions:  Precautions Precautions: Fall Precaution Comments: Notify physician if increase in NIHSS score of 4 or more, patient c/o severe headache, develops nausea/vomiting, pulse less than 50 or greater than 120, systolic BP less than 100 or greater than or equal to 180, diastolic BP less than 70 or greater than or equal to 105, respiratory rate greater than 30, temperature greater than 101.0 F, blood glucose greater than 180 or less than 60 Restrictions Weight Bearing Restrictions: No   Pain: Pain Assessment Pain Assessment: No/denies pain    Locomotion : Ambulation Ambulation/Gait Assistance: 4: Min assist   See FIM for current functional status  Therapy/Group: Individual Therapy  Lurena Joiner B. Rasheeda Mulvehill, PT, DPT [phone]   04/04/2013, 12:38 PM

## 2013-04-04 NOTE — Progress Notes (Signed)
Occupational Therapy Session Note  Patient Details  Name: Lucas Green MRN: [id number] Date of Birth: 04/20/1932  Today's Date: 04/04/2013 Time: 0730-0830 and 7829-5621 Time Calculation (min): 60 min and 33 min  Short Term Goals: Week 1:  OT Short Term Goal 1 (Week 1): STG = LTGs due to ELOS, Mod I overall  Skilled Therapeutic Interventions/Progress Updates:    1) Pt seen for ADL retraining with focus on grooming in standing and transfers.  Pt unable to complete bathing this session secondary to increased fatigue and multiple rest breaks required throughout session.  Engaged in oral hygiene and shaving in standing with close supervision secondary to LLE weakness, tolerated standing approx 20 mins without any physical support, requesting 5 min rest break after grooming in standing.  Pt reports need to have BM, ambulated to toilet with quad cane and min assist.  Pt with multiple rest breaks and requests to bathe during second session.  Donned clothes EOB with close supervision when standing to pull up pants.  2) Pt seen for 1:1 OT with focus on ADL retraining at walk-in shower level.  Pt ambulated to shower with quad cane and min/steady assist, performed bathing at sit to stand level with supervision only.  Pt demonstrated appropriate safety awareness with use of 3-in-1 and grab bars for energy conservation.  Donned clothes EOB with close supervision when standing to pull up pants.  Pt reports LLE weakness and frustration when attempting to don Lt pant leg.  Therapy Documentation Precautions:  Precautions Precautions: Fall Precaution Comments: Notify physician if increase in NIHSS score of 4 or more, patient c/o severe headache, develops nausea/vomiting, pulse less than 50 or greater than 120, systolic BP less than 100 or greater than or equal to 180, diastolic BP less than 70 or greater than or equal to 105, respiratory rate greater than 30, temperature greater than 101.0 F, blood glucose  greater than 180 or less than 60 Restrictions Weight Bearing Restrictions: No General:   Vital Signs: Therapy Vitals Temp: 97.3 F (36.3 C) Temp src: Oral Pulse Rate: 61 Resp: 20 BP: 154/67 mmHg Oxygen Therapy SpO2: 96 % O2 Device: None (Room air) Pain:  Pt with no c/o pain this session.  See FIM for current functional status  Therapy/Group: Individual Therapy  Leonette Monarch 04/04/2013, 8:47 AM

## 2013-04-05 ENCOUNTER — Inpatient Hospital Stay (HOSPITAL_COMMUNITY): Payer: Medicare Other

## 2013-04-05 ENCOUNTER — Inpatient Hospital Stay (HOSPITAL_COMMUNITY): Payer: Medicare Other | Admitting: Physical Therapy

## 2013-04-05 ENCOUNTER — Inpatient Hospital Stay (HOSPITAL_COMMUNITY): Payer: Medicare Other | Admitting: Occupational Therapy

## 2013-04-05 NOTE — Progress Notes (Signed)
Occupational Therapy Session Note  Patient Details  Name: Lucas Green MRN: [id number] Date of Birth: 02-Oct-1932  Today's Date: 04/05/2013 Time: 1300-1330 Time Calculation (min): 30 min  Short Term Goals: Week 1:  OT Short Term Goal 1 (Week 1): STG = LTGs due to ELOS, Mod I overall  Skilled Therapeutic Interventions/Progress Updates:    Therapy session focused on neuro re-ed, dynamic standing balance, functional endurance, and weight shift. Patient ambulated short distances in room with quad cane to put clothing in drawers. Min-steadying assist when reaching in low drawer to place items. Patient engaged in dynamic standing balance activity shifting weight to Lt to reach and place items alternating LUE and RUE. Patient fatigued easily and required rest breaks. Patient engaged in weight shifting from Lt to Rt with mirror to provide visual cue for postural control. Patient reported "feeling less sturdy when leaning to left side." No lob during activities.   Therapy Documentation Precautions:  Precautions Precautions: Fall Precaution Comments: Notify physician if increase in NIHSS score of 4 or more, patient c/o severe headache, develops nausea/vomiting, pulse less than 50 or greater than 120, systolic BP less than 100 or greater than or equal to 180, diastolic BP less than 70 or greater than or equal to 105, respiratory rate greater than 30, temperature greater than 101.0 F, blood glucose greater than 180 or less than 60 Restrictions Weight Bearing Restrictions: No General:   Vital Signs: Therapy Vitals Temp: 97.5 F (36.4 C) Temp src: Oral Pulse Rate: 76 Resp: 21 BP: 125/75 mmHg Patient Position, if appropriate: Lying Oxygen Therapy SpO2: 95 % O2 Device: None (Room air) Pain: No c/o pain however pt did have "muscle spasm" in LLE at beginning of therapy session.  See FIM for current functional status  Therapy/Group: Individual Therapy  Daneil Dan 04/05/2013, 2:46  PM

## 2013-04-05 NOTE — Plan of Care (Signed)
Problem: RH BLADDER ELIMINATION Goal: RH STG MANAGE BLADDER WITH ASSISTANCE STG Manage Bladder With min Assistance  Outcome: Not Progressing Incontinent x 1 today, per report of night nurse incontinent at night

## 2013-04-05 NOTE — Plan of Care (Signed)
Problem: RH BOWEL ELIMINATION Goal: RH STG MANAGE BOWEL W/MEDICATION W/ASSISTANCE STG Manage Bowel with Medication with min Assistance.  Outcome: Progressing Has prn medications for bowels only, LBM 4/25

## 2013-04-05 NOTE — Progress Notes (Signed)
Physical Therapy Session Note  Patient Details  Name: Lucas Green MRN: [id number] Date of Birth: 1932-06-25  Today's Date: 04/05/2013 Time:Session #1: [id number], Session #2: [id number] Time Calculation (min): Session #1: 55 min, Session #2: 43 min  Short Term Goals: Week 1:  PT Short Term Goal 1 (Week 1): = LTG  Skilled Therapeutic Interventions/Progress Updates:    Session #1: Gait training with quad cane min assist for balance and tactile cues for upright/midline posture (pt leans anteriorly and to the right).  Gait >150' x 2.  Side stepping, forward marches, retro gait all 25' x 2.  Stairs bil rails with min assist (3, 6.5" steps), step ups with left leg leading to increase left leg strength and coordination x 10 with bil rails.  Supine exercises: SLR, bridges, hip abduction x 10 each bil (cramping in right hamstring with supine exercises), seated LAQs 2#, and marches 2# x 10 each bil.    Session #2: This session focused on gait with quad cane both to and from the gym >150' x 2 with min assist.  Decreased foot clearance when walking back to room at end of treatment session due to fatigue in left leg. Standing balance and dynamic strengthening activities in parallel bars: standing in mirror trying to work on finding midline with good upright posture.  Both verbal and tactile cues needed to get there.  Pt's wife reports he has always leaned a little to the right (his son does it as well).  Single leg taps on dots and then low cones working on him weight shifting over left leg more and then when legs switched on left leg coordination.  6" step ups leading with left leg x 20 reps with verbal and tactile cues to com over his leg and then push up into extension instead of extending and then leaning over leg. Nu Step level 6 x 8 mins bil arms and legs x 5 mins, just legs x 3 mins. Seated postural exercises with weighted bard and orange t-band, rows x 20 reps, biceps curls x 20 reps.      Therapy  Documentation Precautions:  Precautions Precautions: Fall Precaution Comments: Notify physician if increase in NIHSS score of 4 or more, patient c/o severe headache, develops nausea/vomiting, pulse less than 50 or greater than 120, systolic BP less than 100 or greater than or equal to 180, diastolic BP less than 70 or greater than or equal to 105, respiratory rate greater than 30, temperature greater than 101.0 F, blood glucose greater than 180 or less than 60 Restrictions Weight Bearing Restrictions: No Locomotion : Ambulation Ambulation/Gait Assistance: 4: Min assist   See FIM for current functional status  Therapy/Group: Individual Therapy  Lurena Joiner B. Judson Tsan, PT, DPT [phone]   04/05/2013, 9:59 AM

## 2013-04-05 NOTE — Progress Notes (Signed)
Occupational Therapy Session Note  Patient Details  Name: Gearld Kerstein MRN: [id number] Date of Birth: Aug 29, 1932  Today's Date: 04/05/2013 Time: 0802-0900 Time Calculation (min): 58 min  Short Term Goals: Week 1:  OT Short Term Goal 1 (Week 1): STG = LTGs due to ELOS, Mod I overall  Skilled Therapeutic Interventions/Progress Updates:    Pt seen for ADL retraining with focus on functional mobility and use of AD/AE for ambulation and energy conservation with self-care tasks. Pt obtained clothing prior to bathing with use of quad cane for ambulation, min assist when bending to retrieve items from drawers.  Pt completed toileting with supervision this session, min assist for transfers.  Bathing overall distant supervision at seated level predominantly, standing to wash buttocks.  Pt reports surprise at fatigue from "only bathing" educated on energy conservation with use of shower chair to increase activity tolerance as well as safety.  Pt engaged in dressing at seated level, standing to pull up pants, to further conserve energy and for safety due to decreased single leg stance.  Therapy Documentation Precautions:  Precautions Precautions: Fall Precaution Comments: Notify physician if increase in NIHSS score of 4 or more, patient c/o severe headache, develops nausea/vomiting, pulse less than 50 or greater than 120, systolic BP less than 100 or greater than or equal to 180, diastolic BP less than 70 or greater than or equal to 105, respiratory rate greater than 30, temperature greater than 101.0 F, blood glucose greater than 180 or less than 60 Restrictions Weight Bearing Restrictions: No Pain:  Pt with no c/o pain this session.  See FIM for current functional status  Therapy/Group: Individual Therapy  Leonette Monarch 04/05/2013, 10:59 AM

## 2013-04-05 NOTE — Progress Notes (Signed)
Patient ID: Lucas Green, male   DOB: 09-May-1932, 79 y.o.   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 He stated he'd been feeling generally weak for about 3 days prior to admission. With the onset of his left side weakness on 03/29/2013 he also had slurred speech. He noticed slight numbness in his left upper extremity as well. There is no previous history of stroke nor TIA.Marland Kitchen MRI/MRA brain done revealing acute right infarct paracentral pontine infarct and right VA with occlusion likely chronic. 2D echo with EF 60-65% without wall abnormality. Carotid dopplers without ICA stenosis. Was changed to plavix for ischemic infarct due to SVD Urinary freq, small voids Has incont but no dysuria No BM for a couple days, no abd discomfort pt states this is his usual pattern  Review of Systems  Genitourinary: Positive for urgency and frequency. Negative for dysuria.  Neurological: Negative for sensory change.  All other systems reviewed and are negative.   Objective: Vital Signs: Blood pressure 135/76, pulse 58, temperature 97.8 F (36.6 C), temperature source Oral, resp. rate 18, height 6\' 2"  (1.88 m), weight 89.767 kg (197 lb 14.4 oz), SpO2 97.00%. No results found. No results found for this basename: WBC, HGB, HCT, PLT,  in the last 72 hours No results found for this basename: NA, K, CL, CO, GLUCOSE, BUN, CREATININE, CALCIUM,  in the last 72 hours CBG (last 3)  No results found for this basename: GLUCAP,  in the last 72 hours  Wt Readings from Last 3 Encounters:  04/03/13 89.767 kg (197 lb 14.4 oz)  03/29/13 90.266 kg (199 lb)    Physical Exam:  Constitutional: He is oriented to person, place, and time. He appears well-developed and well-nourished.  HENT: dentition intact. Oral mucosa pink and moist  Head: Normocephalic and atraumatic.  Eyes: Pupils  are equal, round, and reactive to light.  Neck: Normal range of motion.  Cardiovascular: Normal rate and regular rhythm. SEM appreciated.  Pulmonary/Chest: Effort normal and breath sounds normal. No wheezes or rales.  Abdominal: Soft. Bowel sounds are normal. Non tender Neurological: He is alert and oriented to person, place, and time.  Mild dysarthria. Follows commands without difficulty. Left sided weakness with decreased fine motor coordination noted. No gross ataxia. LUE MMT grossly 4 to 4+/5. LLE is Grossly 4 to 4+ as well. No sensory deficits appreciated. Mild left central 7. Cognitively with good insight and awareness. STM intact.  Minimal dysmetria L FNF  Assessment/Plan: 1. Functional deficits secondary to right paracentral pontine infarct which require 3+ hours per day of interdisciplinary therapy in a comprehensive inpatient rehab setting. Physiatrist is providing close team supervision and 24 hour management of active medical problems listed below. Physiatrist and rehab team continue to assess barriers to discharge/monitor patient progress toward functional and medical goals. FIM: FIM - Bathing Bathing Steps Patient Completed: Chest;Right Arm;Left Arm;Abdomen;Front perineal area;Buttocks;Right upper leg;Left upper leg Bathing: 4: Min-Patient completes 8-9 28f 10 parts or 75+ percent  FIM - Upper Body Dressing/Undressing Upper body dressing/undressing steps patient completed: Thread/unthread right sleeve of pullover shirt/dresss;Thread/unthread left sleeve of pullover shirt/dress;Put head through opening of pull over shirt/dress;Pull shirt over trunk Upper body dressing/undressing: 5: Set-up assist to: Obtain clothing/put away FIM - Lower Body Dressing/Undressing Lower body dressing/undressing steps patient completed: Thread/unthread right pants leg;Thread/unthread left pants leg;Pull pants up/down;Don/Doff right shoe;Don/Doff left shoe Lower body dressing/undressing: 4: Steadying  Assist  FIM - Toileting Toileting steps completed by patient: Adjust clothing prior to toileting;Performs perineal hygiene;Adjust clothing after toileting Toileting Assistive Devices: Grab bar or rail for support Toileting: 4: Steadying assist  FIM - Diplomatic Services operational officer Devices: Cane;Grab bars Toilet Transfers: 4-To toilet/BSC: Min A (steadying Pt. > 75%);4-From toilet/BSC: Min A (steadying Pt. > 75%)  FIM - Bed/Chair Transfer Bed/Chair Transfer Assistive Devices: Cane;Bed rails;Arm rests Bed/Chair Transfer: 5: Supine > Sit: Supervision (verbal cues/safety issues);4: Bed > Chair or W/C: Min A (steadying Pt. > 75%);4: Chair or W/C > Bed: Min A (steadying Pt. > 75%)  FIM - Locomotion: Wheelchair Distance: 150 Locomotion: Wheelchair: 0: Activity did not occur FIM - Locomotion: Ambulation Locomotion: Ambulation Assistive Devices: Occupational hygienist Ambulation/Gait Assistance: 4: Min assist Locomotion: Ambulation: 4: Travels 150 ft or more with minimal assistance (Pt.>75%)  Comprehension Comprehension Mode: Auditory Comprehension: 6-Follows complex conversation/direction: With extra time/assistive device  Expression Expression Mode: Verbal Expression: 6-Expresses complex ideas: With extra time/assistive device  Social Interaction Social Interaction: 7-Interacts appropriately with others - No medications needed.  Problem Solving Problem Solving: 6-Solves complex problems: With extra time  Memory Memory: 6-More than reasonable amt of time   Medical Problem List and Plan:  1. DVT Prophylaxis/Anticoagulation: Pharmaceutical: Lovenox  2. Pain Management: Continue tylenol prn for now.  3. Mood: Seems to be appropriate. Will have LCSW follow for evaluation.  4. Neuropsych: This patient Is capable of making decisions on his/her own behalf.  5. HTN: monitor BP with bid checks. Currently BP on low side--amlodipine-valsartan held. Continue Metoprolol.  6. Dyslipidemia:  continue Zocor.  7. Pre Renal Insufficiency: sl improvement today. Push po fluids. Recheck later in the week..  8. Urgency with incontinence: Neurogenic bladder, UMN -no PVR"s recorded  -UA negative, culture neg  -timed voids, offered oxybutnin but pt wants to hold off Denies urinary incontinence or frequency PTA despite prostate cancer hx      LOS (Days) 4 A FACE TO FACE EVALUATION WAS PERFORMED  Sade Hollon E 04/05/2013 8:00 AM

## 2013-04-06 ENCOUNTER — Inpatient Hospital Stay (HOSPITAL_COMMUNITY): Payer: Medicare Other | Admitting: *Deleted

## 2013-04-06 DIAGNOSIS — I10 Essential (primary) hypertension: Secondary | ICD-10-CM

## 2013-04-06 DIAGNOSIS — I6330 Cerebral infarction due to thrombosis of unspecified cerebral artery: Secondary | ICD-10-CM

## 2013-04-06 MED ORDER — NICOTINE 14 MG/24HR TD PT24
14.0000 mg | MEDICATED_PATCH | Freq: Every day | TRANSDERMAL | Status: DC
  Filled 2013-04-06 (×2): qty 1

## 2013-04-06 NOTE — Progress Notes (Signed)
Patient ID: Lucas Green, male   DOB: 1932/02/23, 79 y.o.   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 He stated he'd been feeling generally weak for about 3 days prior to admission. With the onset of his left side weakness on 03/29/2013 he also had slurred speech. He noticed slight numbness in his left upper extremity as well. There is no previous history of stroke nor TIA.Marland Kitchen MRI/MRA brain done revealing acute right infarct paracentral pontine infarct and right VA with occlusion likely chronic. 2D echo with EF 60-65% without wall abnormality. Carotid dopplers without ICA stenosis. Was changed to plavix for ischemic infarct due to SVD Urinary freq, small voids Has incont but no dysuria  No BM for a couple days, no abd discomfort pt states this is his usual pattern   ROS: Admits to some fatigue with therapy. No other complaints Objective: Vital Signs: Blood pressure 129/77, pulse 73, temperature 98 F (36.7 C), temperature source Oral, resp. rate 18, height 6\' 2"  (1.88 m), weight 197 lb 14.4 oz (89.767 kg), SpO2 97.00%. elderly male in no acute distress. HEENT exam atraumatic, normocephalic, neck supple without jugular venous distention. Chest clear to auscultation cardiac exam S1-S2 are regular. 3/6 SEM Abdominal exam overweight with bowel sounds, soft and nontender. Extremities no edema. Neurologic exam is alert with a normal gait.   Wt Readings from Last 3 Encounters:  04/03/13 197 lb 14.4 oz (89.767 kg)  03/29/13 199 lb (90.266 kg)    Physical Exam:    Assessment/Plan: 1. Functional deficits secondary to right paracentral pontine infarct  Medical Problem List and Plan:  1. DVT Prophylaxis/Anticoagulation: Pharmaceutical: Lovenox  2. Pain Management: Continue tylenol  3. Mood: Seems to be appropriate. Will have LCSW follow for evaluation.   4. Neuropsych: This patient Is capable of making decisions on his/her own behalf.  5. HTN: monitor BP with bid checks. bp is variable BP Readings from Last 3 Encounters:  04/06/13 150/71  04/01/13 126/65    6. Dyslipidemia: continue Zocor.  7. Pre Renal Insufficiency:  Lab Results  Component Value Date   CREATININE 1.10 04/02/2013    8. Urgency with incontinence: Neurogenic bladder, UMN -no PVR"s recorded  -UA negative, culture neg  -timed voids, offered oxybutnin but pt wants to hold off Denies urinary incontinence or frequency PTA despite prostate cancer hx      LOS (Days) 5 A FACE TO FACE EVALUATION WAS PERFORMED  [facility] HENRY 04/06/2013 5:55 AM

## 2013-04-06 NOTE — Progress Notes (Signed)
Physical Therapy Note  Patient Details  Name: MAN EFFERTZ MRN: [id number] Date of Birth: [birth year] Today's Date: 04/06/2013  1000-1055 (55 minutes) individual Pain: no reported pain Pt participated in PT group session focused on gait training/safety/endurance; pt ambulates with LBQC min to close SBA 80-100 feet  X 3 for safety (decreased balance) Distance limited by fatigue; up/down 6 inch step RT LE only for quad control /strengthening ( decreased eccentric quad control).    Adam Sanjuan,JIM 04/06/2013, 10:08 AM

## 2013-04-07 ENCOUNTER — Inpatient Hospital Stay (HOSPITAL_COMMUNITY): Payer: Medicare Other | Admitting: *Deleted

## 2013-04-07 NOTE — Progress Notes (Signed)
Patient ID: Lucas Green, male   DOB: September 08, 1932, 79 y.o.   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 He stated he'd been feeling generally weak for about 3 days prior to admission. With the onset of his left side weakness on 03/29/2013 he also had slurred speech. He noticed slight numbness in his left upper extremity as well. There is no previous history of stroke nor TIA.Marland Kitchen MRI/MRA brain done revealing acute right infarct paracentral pontine infarct and right VA with occlusion likely chronic. 2D echo with EF 60-65% without wall abnormality. Carotid dopplers without ICA stenosis. Was changed to plavix for ischemic infarct due to SVD Urinary freq, small voids Has incont but no dysuria  Patient feels pretty well today. No specific complaints. He has noted occasional hiccups.  ROS: No specific complaints. Objective: Vital Signs: Blood pressure 132/69, pulse 57, temperature 97.4 F (36.3 C), temperature source Oral, resp. rate 17, height 6\' 2"  (1.88 m), weight 197 lb 14.4 oz (89.767 kg), SpO2 96.00%. Elderly male in no acute distress. Chest her auscultation cardiac exam S1-S2 are regular. Abdominal exam overweight, active bowel sounds. Extremities no edema.   Wt Readings from Last 3 Encounters:  04/03/13 197 lb 14.4 oz (89.767 kg)  03/29/13 199 lb (90.266 kg)     Assessment/Plan: 1. Functional deficits secondary to right paracentral pontine infarct  Medical Problem List and Plan:  1. DVT Prophylaxis/Anticoagulation: Pharmaceutical: Lovenox  2. Pain Management: Continue tylenol  3. Mood: Seems to be appropriate. Will have LCSW follow for evaluation.  4. Neuropsych: This patient Is capable of making decisions on his/her own behalf.  5. HTN: monitor BP improved. Continue current medications. 6. Dyslipidemia: continue Zocor.  7. Pre Renal  Insufficiency:  Lab Results  Component Value Date   CREATININE 1.10 04/02/2013    8. Urgency with incontinence: Neurogenic bladder, UMN -no PVR"s recorded  -UA negative, culture neg  -timed voids, offered oxybutnin but pt wants to hold off Denies urinary incontinence or frequency PTA despite prostate cancer hx      LOS (Days) 6 A FACE TO FACE EVALUATION WAS PERFORMED  [facility] 04/07/2013 8:47 AM

## 2013-04-07 NOTE — Progress Notes (Signed)
Physical Therapy Session Note  Patient Details  Name: Lucas Green MRN: [id number] Date of Birth: 09-08-1932  Today's Date: 04/07/2013 Time: 0812-0912  Skilled Therapeutic Interventions/Progress Updates:  Patient did not report pain pre/during/post therapy. Gait training 2 x 176 feet with Quad cane and CGA due to LOB, verbal cues provided for postural adjustments and weight shifting. Nustep 2 x 5 min @ level 7 to increase endurance and strength. Dynamic balance training in standing in front of the mirror. Sit to stand 2 x 5 holding 2lbs bar to reduce use of UE in transfer. Stepping up 2 x10 and side stepping 2  X 10 on each side on 6 in step w/o AD, in front of the mirror for visual feedback. Coordination training stepping up and biceps curls with 2 lbs bar ,performed simultaneously,patient requires min A to maintain balance.Obstacle course on a distance of 25 feet including stepping on and off a 6 in step, stepping over obstacles and maneuvering between cones.Course done  X 2 .  Therapy Documentation Precautions:  Precautions Precautions: Fall Precaution Comments: Notify physician if increase in NIHSS score of 4 or more, patient c/o severe headache, develops nausea/vomiting, pulse less than 50 or greater than 120, systolic BP less than 100 or greater than or equal to 180, diastolic BP less than 70 or greater than or equal to 105, respiratory rate greater than 30, temperature greater than 101.0 F, blood glucose greater than 180 or less than 60 Restrictions Weight Bearing Restrictions: No Vital Signs: Therapy Vitals Temp: 97.4 F (36.3 C) Temp src: Oral Pulse Rate: 57 Resp: 17 BP: 132/69 mmHg Patient Position, if appropriate: Lying Oxygen Therapy SpO2: 96 % O2 Device: None (Room air)  See FIM for current functional status  Therapy/Group: Individual Therapy  Dorna Mai 04/07/2013, 9:13 AM

## 2013-04-07 NOTE — Progress Notes (Signed)
Physical Therapy Note  Patient Details  Name: Lucas Green MRN: [id number] Date of Birth: 22-Apr-1932 Today's Date: 04/07/2013  1400-1455 (55 minutes) group Pain: no complaint of pain Pt participated in PT group session focused on gait training/safety/endurance; Pt ambulates from room>< gym 120 feet with LBQC close SBA with one incident of instability when changing directions (min assist); Kinetron in standing 2 X 10 focusing on RT knee strengthening/control; standing stepping to targets on floor with decreased stance time on left LE; standing ball toss close SBA.    [name],JIM 04/07/2013, 2:24 PM

## 2013-04-07 NOTE — Progress Notes (Signed)
Occupational Therapy Note  Patient Details  Name: Lucas Green MRN: [id number] Date of Birth: [birth year] Today's Date: 04/07/2013  [id number] 45 min) Pain:  None Individual session  Pt. Lying in bed upon OT arrival.  Addressed the following during session:  Balance in standing and sitting, functional ambulation with/without cane, LUE NMRE, activity tolerance, endurance.  Pt. Gathered items for shower at standing and walking level with supervision.  Did trial standing balance during undressing and moving without cane.  Pt. Holding to bars, wall with both hands when undressing LB.  Pt. Bathed self with distant supervision while sitting on seat.  After showering pt stood to dry off.  Needed to hold with one hand while drying.  Ambulated with QC out to bed.  Pt had one small stepping LOB and needed minimal assist to regain.  Pt. Sat on EOB to complete dressing due to fatigue.  Next therapy arrived and proceeded.      Humberto Seals 04/07/2013, 7:45 AM

## 2013-04-07 NOTE — Progress Notes (Signed)
Occupational Therapy Note  Patient Details  Name: KAZ AULD MRN: [id number] Date of Birth: 18-Jun-1932 Today's Date: 04/07/2013 1300-1330  (30 min) Pain:  None Individual session  Focus of session:  Functional mobility, balance activities, shower stall transfer, Pt.family education.  Pt. Ambulated with quad cane to ADL apartment.  Addressed household duties (making bed) for balance, and mobility.  Pt. Walked around bed and leaned and reach with no LOB.  Pt. Walked to wardrobe and squatted to turn on TV.  Sat on sofa with decreased eccentric control.  Needed cues to scoot to Edge of sofa to go from sit to stand.  Practiced shower transfer with frame.  Pt stepped over and out of shower with minimal assist using  quad cane.  Recommend shower seat, grab bars (2), and Hand held shower hose.  Wife entered and discussed with her.  She mentioned the suction grab bars.  Discussed pros and cons of the suction.  Recommended the bars that attach to the wall studs.  Pt. Walked back to room.  Ambulated to bathroom.  Wife present.  Asked pt to ring call light when finished.    Humberto Seals 04/07/2013, 1:57 PM

## 2013-04-08 ENCOUNTER — Inpatient Hospital Stay (HOSPITAL_COMMUNITY): Payer: Medicare Other | Admitting: Occupational Therapy

## 2013-04-08 ENCOUNTER — Inpatient Hospital Stay (HOSPITAL_COMMUNITY): Payer: Medicare Other | Admitting: Physical Therapy

## 2013-04-08 DIAGNOSIS — I6330 Cerebral infarction due to thrombosis of unspecified cerebral artery: Secondary | ICD-10-CM

## 2013-04-08 DIAGNOSIS — R32 Unspecified urinary incontinence: Secondary | ICD-10-CM

## 2013-04-08 DIAGNOSIS — I10 Essential (primary) hypertension: Secondary | ICD-10-CM

## 2013-04-08 DIAGNOSIS — C61 Malignant neoplasm of prostate: Secondary | ICD-10-CM

## 2013-04-08 MED ORDER — OXYBUTYNIN CHLORIDE 5 MG PO TABS
2.5000 mg | ORAL_TABLET | Freq: Every evening | ORAL | Status: DC | PRN
  Administered 2013-04-08 – 2013-04-11 (×2): 2.5 mg via ORAL
  Filled 2013-04-08 (×2): qty 0.5

## 2013-04-08 NOTE — Progress Notes (Signed)
Social Work  Social Work Assessment and Plan  Patient Details  Name: Lucas Green MRN: [id number] Date of Birth: Dec 29, 1931  Today's Date: 04/08/2013  Problem List:  Patient Active Problem List  Diagnosis  . Hypertension  . Brainstem infarct, acute  . Other and unspecified hyperlipidemia  . Leukocytosis, unspecified  . Renal insufficiency   Past Medical History:  Past Medical History  Diagnosis Date  . Cancer     prostate  . Hypertension   . GERD (gastroesophageal reflux disease)    Past Surgical History:  Past Surgical History  Procedure Laterality Date  . Colon surgery     Social History:  reports that he has never smoked. He has never used smokeless tobacco. He reports that  drinks alcohol. He reports that he does not use illicit drugs.  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 Caregiver Availability: 24/7 Family Dynamics: Wife supportive but is active in community - reluctant to commit to 24/7 for very long.  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 Return to Work Plans: Pt fully intends to return to his SA job as soon as he is able. Legal Hisotry/Current Legal Issues: none Guardian/Conservator: none   Abuse/Neglect Physical Abuse: Denies Verbal  Abuse: Denies Sexual Abuse: Denies Exploitation of patient/patient's resources: Denies Self-Neglect: Denies  Emotional Status Pt's affect, behavior adn adjustment status: Pt very pleasant, oriented and eager to complete CIR as soon as possible.  Very focused on reaching as independent level as possible.  Denies any significant emotional distress, however, will monitor. Recent Psychosocial Issues: None Pyschiatric History: None Substance Abuse History: None  Patient / Family Perceptions, Expectations & Goals Pt/Family understanding of illness & functional limitations: Pt and wife with good understanding of his medical issues and functional limitations. Premorbid pt/family roles/activities: Pt working full-time out of town during the week and wife involved in several community groups. Anticipated changes in roles/activities/participation: Wife will likely need to be more available at home initially and provide caregiver assist as needed. Pt/family expectations/goals: Pt's primary focus is his overall recovery and to be able to return to his work.  Community Resources [employer]: None Premorbid Home Care/DME Agencies: None Transportation available at discharge: yes Resource referrals recommended: Support group (specify) (Stroke)  Discharge Planning Living Arrangements: Spouse/significant other Support Systems: Spouse/significant other Type of Residence: Private residence Insurance Resources: [employer] Financial Resources: Social Doctor, hospital Screen Referred: No Living Expenses: Own Money Management: Patient Do you have any problems obtaining your medications?: No Home Management: pt and wife to share Patient/Family Preliminary Plans: pt plans to d/c home with wife who can provide 24/7 initially Social Work Anticipated Follow Up Needs: HH/OP Expected length of stay: 10 days  Clinical Impression Pleasant gentleman here after stroke with wife at bedside.  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  Fully  intends to return to this work.  Very motivated.  Anticipate mod i goals.  Lucas Green 04/08/2013, 11:09 AM

## 2013-04-08 NOTE — Progress Notes (Signed)
Patient ID: Lucas Green, male   DOB: [birth year], 79 y.o.   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 He stated he'd been feeling generally weak for about 3 days prior to admission. With the onset of his left side weakness on 03/29/2013 he also had slurred speech. He noticed slight numbness in his left upper extremity as well. There is no previous history of stroke nor TIA.Marland Kitchen MRI/MRA brain done revealing acute right infarct paracentral pontine infarct and right VA with occlusion likely chronic. 2D echo with EF 60-65% without wall abnormality. Carotid dopplers without ICA stenosis. Was changed to plavix for ischemic infarct due to SVD Urinary freq, small voids Has incont at noc, no dysuria   Review of Systems  Genitourinary: Positive for urgency and frequency. Negative for dysuria.  Neurological: Negative for sensory change.  All other systems reviewed and are negative.   Objective: Vital Signs: Blood pressure 127/71, pulse 80, temperature 98.3 F (36.8 C), temperature source Oral, resp. rate 19, height 6\' 2"  (1.88 m), weight 89.767 kg (197 lb 14.4 oz), SpO2 94.00%. No results found. No results found for this basename: WBC, HGB, HCT, PLT,  in the last 72 hours  Recent Labs  04/08/13 0710  CREATININE 1.20   CBG (last 3)  No results found for this basename: GLUCAP,  in the last 72 hours  Wt Readings from Last 3 Encounters:  04/03/13 89.767 kg (197 lb 14.4 oz)  03/29/13 90.266 kg (199 lb)    Physical Exam:  Constitutional: He is oriented to person, place, and time. He appears well-developed and well-nourished.  HENT: dentition intact. Oral mucosa pink and moist  Head: Normocephalic and atraumatic.  Eyes: Pupils are equal, round, and reactive to light.  Neck: Normal range of motion.  Cardiovascular: Normal rate and regular rhythm. SEM  appreciated.  Pulmonary/Chest: Effort normal and breath sounds normal. No wheezes or rales.  Abdominal: Soft. Bowel sounds are normal. Non tender Neurological: He is alert and oriented to person, place, and time.  Mild dysarthria. Follows commands without difficulty. Left sided weakness with decreased fine motor coordination noted. No gross ataxia. LUE MMT grossly 4 to 4+/5. LLE is Grossly 4 to 4+ as well. No sensory deficits appreciated. Mild left central 7. Cognitively with good insight and awareness. STM intact.  Minimal dysmetria L FNF  Assessment/Plan: 1. Functional deficits secondary to right paracentral pontine infarct which require 3+ hours per day of interdisciplinary therapy in a comprehensive inpatient rehab setting. Physiatrist is providing close team supervision and 24 hour management of active medical problems listed below. Physiatrist and rehab team continue to assess barriers to discharge/monitor patient progress toward functional and medical goals. FIM: FIM - Bathing Bathing Steps Patient Completed: Chest;Right Arm;Left Arm;Abdomen;Front perineal area;Buttocks;Right upper leg;Left upper leg;Right lower leg (including foot);Left lower leg (including foot) Bathing: 0: Activity did not occur  FIM - Upper Body Dressing/Undressing Upper body dressing/undressing steps patient completed: Thread/unthread right sleeve of pullover shirt/dresss;Thread/unthread left sleeve of pullover shirt/dress;Put head through opening of pull over shirt/dress;Pull shirt over trunk Upper body dressing/undressing: 0: Activity did not occur FIM - Lower Body Dressing/Undressing Lower body dressing/undressing steps patient completed: Thread/unthread right underwear leg;Thread/unthread left underwear leg;Pull underwear up/down;Thread/unthread right pants leg;Thread/unthread left pants leg;Pull pants up/down;Fasten/unfasten pants;Don/Doff right sock;Don/Doff left sock;Don/Doff right shoe;Don/Doff left  shoe Lower body dressing/undressing: 0: Activity did not occur  FIM - Toileting  Toileting steps completed by patient: Adjust clothing after toileting Toileting Assistive Devices: Grab bar or rail for support Toileting: 0: Activity did not occur  FIM - Diplomatic Services operational officer Devices: Grab bars Toilet Transfers: 5-To toilet/BSC: Supervision (verbal cues/safety issues);5-From toilet/BSC: Supervision (verbal cues/safety issues)  FIM - Banker Devices: Teacher, music: 5: Bed > Chair or W/C: Supervision (verbal cues/safety issues)  FIM - Locomotion: Wheelchair Distance: 150 Locomotion: Wheelchair: 0: Activity did not occur FIM - Locomotion: Ambulation Locomotion: Ambulation Assistive Devices: Occupational hygienist Ambulation/Gait Assistance: 4: Min assist Locomotion: Ambulation: 4: Travels 150 ft or more with minimal assistance (Pt.>75%)  Comprehension Comprehension Mode: Auditory Comprehension: 6-Follows complex conversation/direction: With extra time/assistive device  Expression Expression Mode: Verbal Expression: 6-Expresses complex ideas: With extra time/assistive device  Social Interaction Social Interaction: 7-Interacts appropriately with others - No medications needed.  Problem Solving Problem Solving: 6-Solves complex problems: With extra time  Memory Memory: 6-More than reasonable amt of time   Medical Problem List and Plan:  1. DVT Prophylaxis/Anticoagulation: Pharmaceutical: Lovenox  2. Pain Management: Continue tylenol prn for now.  3. Mood: Seems to be appropriate. Will have LCSW follow for evaluation.  4. Neuropsych: This patient Is capable of making decisions on his/her own behalf.  5. HTN: monitor BP with bid checks. Currently BP on low side--amlodipine-valsartan held. Continue Metoprolol.  6. Dyslipidemia: continue Zocor.  7. Pre Renal Insufficiency: sl improvement today. Push po fluids. Recheck  later in the week..  8. Urgency with incontinence: Neurogenic bladder, UMN -no PVR"s recorded  -UA negative, culture neg  -timed voids, offered oxybutnin but pt wants to try this now, start 2.5 qhs Denies urinary incontinence or frequency PTA despite prostate cancer hx      LOS (Days) 7 A FACE TO FACE EVALUATION WAS PERFORMED  Claudette Laws E 04/08/2013 8:22 AM

## 2013-04-08 NOTE — Progress Notes (Signed)
Physical Therapy Weekly Progress Note  Patient Details  Name: Lucas Green MRN: [id number] Date of Birth: 27-Jun-1932  Today's Date: 04/08/2013 Time: 0905-1000 and 1305-1350 Time Calculation (min): 55 min and 45 min  Patient has met 0 of 9 long term goals.  Short term goals not set due to estimated length of stay.  Patient has made steady progress towards PT LTG and is currently supervision overall for basic transfers, gait with cane in controlled environment and min A for up/down one step with cane to simulated home entry/exit.  Patient continues to require intermittent min A secondary to LOB with changes in direction and intermittent posterior LOB.      Patient continues to demonstrate the following deficits: L sided weakness, impaired activity tolerance and endurance, impaired motor control, impaired postural control, impaired dynamic standing balance, balance reactions, and impaired gait and therefore will continue to benefit from skilled PT intervention to enhance overall performance with activity tolerance, balance, postural control, ability to compensate for deficits, functional use of  left lower extremity and coordination.  See Patient's Care Plan for progression toward long term goals.  Patient progressing toward long term goals..  Continue plan of care.  Skilled Therapeutic Interventions/Progress Updates:   Performed gait training in controlled environment x 150' x 1 rep with quad cane and supervision; patient continues to present with R lateral lean, decreased R stance time and R foot slap during initial stance but patient able to to verbalize safe sequence with cane.  Transitioned to [facility] to assess safety and stability with SPC; performed gait with SPC in controlled environment x 150' x 2 reps with supervision overall.  Patient reports improved comfort and efficiency with SPC.  Continued gait training with SPC with up and down one step to simulate home entry/exit with one hand on door  frame and RUE on [facility]; gave patient visual and verbal demonstration and patient gave repeat demonstration x 6 reps with min A overall secondary to tendency to maintain COG posterior and lean posterior but patient did demonstrate safe sequencing.  Performed balance and ankle strategy training standing on 2 x 4 beginning with bilat UE support >> no UE support statically for 1 minute x 2 reps, added in vertical and horizontal head turns with decreased UE support and min A (patient reports greatest difficulty with vertical head turns), dynamic UE movements with bilat UE shoulder flexion, trunk rotation reaching across midline for target bilaterally, and lastly R and L lateral stepping along 2 x 4 with minimal UE support.  Intermittent rest breaks required secondary to fatigue.  Returned to room and recliner with supervision and SPC.  PM session: wife present; discussed with wife equipment needs for home; wife reports she has ordered at shower chair and toilet rails for home but will need SPC ordered.  Also discussed that therapy is recommending outpatient PT for f/u.  Educated patient on OTAGO HEP for LE strengthening and balance; demonstrated each LE exercise and balance exercise to patient and patient gave repeat demonstration in standing with bilat >> unilateral UE support on tall counter and supervision with intermittent sitting rest breaks.  Will provide patient with handout of exercises with specific parameters for home.  Offered patient to go on community outing tomorrow; patient refused and requested to stay and participate in individual PT.    Therapy Documentation Precautions:  Precautions Precautions: Fall Precaution Comments: Notify physician if increase in NIHSS score of 4 or more, patient c/o severe headache, develops nausea/vomiting, pulse less than  50 or greater than 120, systolic BP less than 100 or greater than or equal to 180, diastolic BP less than 70 or greater than or equal to 105,  respiratory rate greater than 30, temperature greater than 101.0 F, blood glucose greater than 180 or less than 60 Restrictions Weight Bearing Restrictions: No Pain: Pain Assessment Pain Assessment: No/denies pain Pain Score: 0-No pain Locomotion : Ambulation Ambulation/Gait Assistance: 5: Supervision   See FIM for current functional status  Therapy/Group: Individual Therapy  Edman Circle [facility] 04/08/2013, 12:16 PM

## 2013-04-08 NOTE — Progress Notes (Signed)
Occupational Therapy Session Note  Patient Details  Name: Lucas Green MRN: [id number] Date of Birth: Apr 21, 1932  Today's Date: 04/08/2013 Time: 0800-0900 and 4098-1191 Time Calculation (min): 60 min and 30 min  Short Term Goals: Week 1:  OT Short Term Goal 1 (Week 1): STG = LTGs due to ELOS, Mod I overall  Skilled Therapeutic Interventions/Progress Updates:    Visit 1:   No c/o pain.  Pt seen for BADL retraining of toileting, bathing, and dressing with a focus on dynamic balance and functional mobility with use of quad cane.  Pt was able to stand at sink to complete shaving/ grooming for 15-20 min without fatigue. He then completed all self care with supervision and once he was in the shower he bathed with mod I.  At end of session PT had arrived.  Visit 2: No c/o pain.  Pt seen this session for functional mobility tasks with a SPC related to BADL and IADL. Pts wife present during the entire therapy session. Pt often stays in hotels that have a tub versus a walk in shower.  He worked on stepping in and out of tub with min assist.  He will not be traveling for 2 months so he will have time to improve his LE strength for those transfers. He also worked on sitting down onto a low couch and discussed and practiced procedures for safe sit to stand techniques.  He does not do any meal prep, but will put away dishes from the dishwasher or get himself a cup of coffee. Pt practiced accessing the refrigerator, cabinets and dishwasher with supervision. Pt ambulated back to room with supervision and no fatigue.   Therapy Documentation Precautions:  Precautions Precautions: Fall Precaution Comments: Notify physician if increase in NIHSS score of 4 or more, patient c/o severe headache, develops nausea/vomiting, pulse less than 50 or greater than 120, systolic BP less than 100 or greater than or equal to 180, diastolic BP less than 70 or greater than or equal to 105, respiratory rate greater than 30,  temperature greater than 101.0 F, blood glucose greater than 180 or less than 60 Restrictions Weight Bearing Restrictions: No     ADL: See FIM for current functional status  Therapy/Group: Individual Therapy  Chrisie Jankovich 04/08/2013, 9:51 AM

## 2013-04-08 NOTE — Progress Notes (Signed)
[facility] Individual Statement of Services  Patient Name:  Lucas Green  Date:  04/08/2013  Welcome to the [facility].  Our goal is to provide you with an individualized program based on your diagnosis and situation, designed to meet your specific needs.  With this comprehensive rehabilitation program, you will be expected to participate in at least 3 hours of rehabilitation therapies Monday-Friday, with modified therapy programming on the weekends.  Your rehabilitation program will include the following services:  Physical Therapy (PT), Occupational Therapy (OT), Speech Therapy (ST), 24 hour per day rehabilitation nursing, Therapeutic Recreaction (TR), Case Management (Social Worker), Rehabilitation Medicine, Nutrition Services and Pharmacy Services  Weekly team conferences will be held on Wednesdays to discuss your progress.  Your Social Worker will talk with you frequently to get your input and to update you on team discussions.  Team conferences with you and your family in attendance may also be held.  Expected length of stay: 10 days  Overall anticipated outcome: modified independent  Depending on your progress and recovery, your program may change. Your Social Worker will coordinate services and will keep you informed of any changes. Your Social Worker's name and contact numbers are listed  below.  The following services may also be recommended but are not provided by the [facility]:   Driving Evaluations  Home Health Rehabiltiation Services  Outpatient Rehabilitatation [facility]  Vocational Rehabilitation   Arrangements will be made to provide these services after discharge if needed.  Arrangements include referral to agencies that provide these services.  Your insurance has been verified to be:  [employer] Your primary doctor is:  Dr. Sol Passer  Pertinent information will be shared with your doctor and your  insurance company.  Social Worker:  [place], [place] [phone] or ([phone]  Information discussed with and copy given to patient by: Amada Jupiter, 04/08/2013, 11:11 AM

## 2013-04-09 ENCOUNTER — Inpatient Hospital Stay (HOSPITAL_COMMUNITY): Payer: Medicare Other | Admitting: Physical Therapy

## 2013-04-09 ENCOUNTER — Inpatient Hospital Stay (HOSPITAL_COMMUNITY): Payer: Medicare Other

## 2013-04-09 ENCOUNTER — Encounter (HOSPITAL_COMMUNITY): Payer: Medicare Other

## 2013-04-09 DIAGNOSIS — C61 Malignant neoplasm of prostate: Secondary | ICD-10-CM

## 2013-04-09 DIAGNOSIS — I6330 Cerebral infarction due to thrombosis of unspecified cerebral artery: Secondary | ICD-10-CM

## 2013-04-09 DIAGNOSIS — I10 Essential (primary) hypertension: Secondary | ICD-10-CM

## 2013-04-09 DIAGNOSIS — R32 Unspecified urinary incontinence: Secondary | ICD-10-CM

## 2013-04-09 NOTE — Progress Notes (Signed)
Occupational Therapy Session Note  Patient Details  Name: Lucas Green MRN: [id number] Date of Birth: 05-30-1932  Today's Date: 04/09/2013 Time: 4098-1191 Time Calculation (min): 45 min  Short Term Goals: STGs=LTGs  Skilled Therapeutic Interventions/Progress Updates:    Therapy session focused on dynamic standing balance, functional transfers, and activity tolerance. Pt ambulated from room to therapy gym and back to room using [place] with supervision. Engaged in dynamic standing activity of retrieving items from overhead cabinets in standing and retrieving items from refrigerator to place on counter beside him with supervision. Pt required rest break after task and completed sit<>stand from couch with supervision. Engaged in nustep therapeutic exercise with workload 2 to increase activity tolerance for self-care tasks. Engaged in dynamic standing activity of ring toss game for 7 min at end of therapy session before requesting rest break. Stood without AD to toss rings at target with no lob. Ambulated back to room and completed sit to supine with supervision.   Therapy Documentation Precautions:  Precautions Precautions: Fall Precaution Comments: Notify physician if increase in NIHSS score of 4 or more, patient c/o severe headache, develops nausea/vomiting, pulse less than 50 or greater than 120, systolic BP less than 100 or greater than or equal to 180, diastolic BP less than 70 or greater than or equal to 105, respiratory rate greater than 30, temperature greater than 101.0 F, blood glucose greater than 180 or less than 60 Restrictions Weight Bearing Restrictions: No General:   Vital Signs:   Pain: No c/o pain during therapy session.   See FIM for current functional status  Therapy/Group: Individual Therapy  Daneil Dan 04/09/2013, 3:51 PM

## 2013-04-09 NOTE — Progress Notes (Signed)
Physical Therapy Session Note  Patient Details  Name: Lucas Green MRN: [id number] Date of Birth: [birth year]  Today's Date: 04/09/2013 Time: 4098-1191 and 4782-9562 Time Calculation (min): 64 min and 32 min  Short Term Goals: Week 1:  PT Short Term Goal 1 (Week 1): = LTG  Skilled Therapeutic Interventions/Progress Updates:   Patient seated EOB; pt donned pants and shoes EOB in sitting and standing with supervision.  Performed gait with SPC x 150' in controlled environment with supervision.  Performed bilat UE and LE coordination, strengthening and endurance training on Nustep at level 5 x 8 minutes. Continued OTAGO LE strengthening and balance training with bilat UE support on // bars with supervision overall; patient with improved standing and activity tolerance today and was able to complete all 5 LE strengthening exercises in standing without sitting rest break.  Did require increased rest breaks during dynamic balance and higher level gait training.  Provided patient with handouts of each exercise for home.    PM session: Patient performed ambulation in controlled environment x 150' with no AD and min A overall with tactile cues to maintain upright posture and verbal cues for arm swing, increased stance time LLE and increased step length bilaterally with focus on heel strike at initial stance.  Performed sit <> stand training from hi-lo mat beginning with tallest setting progressing towards lower setting with LUE reaching forwards to maintain contact with mirror during full sit <> stand sequence to facilitate full anterior lean and to facilitate forward translation of femur and tibia over BOS to minimize posterior LOB with supervision-min A at lower setting.  Also performed trunk and postural control training holding 10 lb weight in RUE to facilitate activation of lateral L trunk/core muscles to maintain upright trunk posture in static stand and during gait x 50' + 150' with min A overall with  focus on maintaining core activation during gait; patient reports feeling more stable, upright and increased weight shift to LLE.  Patient returned to bed to rest prior to dinner.    Therapy Documentation Precautions:  Precautions Precautions: Fall Precaution Comments: Notify physician if increase in NIHSS score of 4 or more, patient c/o severe headache, develops nausea/vomiting, pulse less than 50 or greater than 120, systolic BP less than 100 or greater than or equal to 180, diastolic BP less than 70 or greater than or equal to 105, respiratory rate greater than 30, temperature greater than 101.0 F, blood glucose greater than 180 or less than 60 Restrictions Weight Bearing Restrictions: No Pain: Pain Assessment Pain Assessment: No/denies pain Locomotion : Ambulation Ambulation/Gait Assistance: 5: Supervision   See FIM for current functional status  Therapy/Group: Individual Therapy  Edman Circle [facility] 04/09/2013, 12:11 PM

## 2013-04-09 NOTE — Plan of Care (Signed)
Problem: RH BLADDER ELIMINATION Goal: RH STG MANAGE BLADDER WITH MEDICATION WITH ASSISTANCE STG Manage Bladder With Medication With mod I Assistance. Outcome: Progressing Ditropan hs

## 2013-04-09 NOTE — Progress Notes (Signed)
Occupational Therapy Note  Patient Details  Name: Lucas Green MRN: [id number] Date of Birth: 05/02/1932 Today's Date: 04/09/2013  Time: 10:05-11am (55 min.) Pt seen for 1:1 OT session focusing on transfers, BADL's and dynamic standing and sitting balance. Pt sitting EOB upon arrival prepared to shower and dress. Pt used SPC when walking, although required several reminders to use it when ambulating around room/bathroom. Wife present for session and had brought pt clean clothes. Pt used toilet 2x during session with S. Verbal cues for safety as pt tried to kick slippers off while standing at wall. Recommended pt sit on commode to increase safety. Pt showered sitting and standing, taking his time and holding onto GB's. Pt dressed EOB with increased time then completed grooming standing at sink. Pt is actively using LUE during ADL tasks and was encouraged to keep doing so. No pain reported, just fatigue.     Lucas Green Hessie Diener 04/09/2013, 12:14 PM

## 2013-04-09 NOTE — Progress Notes (Signed)
Patient ID: Lucas Green, male   DOB: [birth year], 79 y.o.   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 He stated he'd been feeling generally weak for about 3 days prior to admission. With the onset of his left side weakness on 03/29/2013 he also had slurred speech. He noticed slight numbness in his left upper extremity as well. There is no previous history of stroke nor TIA.Marland Kitchen MRI/MRA brain done revealing acute right infarct paracentral pontine infarct and right VA with occlusion likely chronic. 2D echo with EF 60-65% without wall abnormality. Carotid dopplers without ICA stenosis. Was changed to plavix for ischemic infarct due to SVD Urinary freq, small voids Has incont at noc, no dysuria  Would like to drive to outpt PT/OT post D/C Review of Systems  Genitourinary: Positive for urgency and frequency. Negative for dysuria.  Neurological: Negative for sensory change.  All other systems reviewed and are negative.   Objective: Vital Signs: Blood pressure 149/63, pulse 58, temperature 97.7 F (36.5 C), temperature source Oral, resp. rate 20, height 6\' 2"  (1.88 m), weight 89.767 kg (197 lb 14.4 oz), SpO2 97.00%. No results found. No results found for this basename: WBC, HGB, HCT, PLT,  in the last 72 hours  Recent Labs  04/08/13 0710  CREATININE 1.20   CBG (last 3)  No results found for this basename: GLUCAP,  in the last 72 hours  Wt Readings from Last 3 Encounters:  04/03/13 89.767 kg (197 lb 14.4 oz)  03/29/13 90.266 kg (199 lb)    Physical Exam:  Constitutional: He is oriented to person, place, and time. He appears well-developed and well-nourished.  HENT: dentition intact. Oral mucosa pink and moist  Head: Normocephalic and atraumatic.  Eyes: Pupils are equal, round, and reactive to light.  Neck: Normal range of motion.   Cardiovascular: Normal rate and regular rhythm. SEM appreciated.  Pulmonary/Chest: Effort normal and breath sounds normal. No wheezes or rales.  Abdominal: Soft. Bowel sounds are normal. Non tender Neurological: He is alert and oriented to person, place, and time.  Mild dysarthria. Follows commands without difficulty. Left sided weakness with decreased fine motor coordination noted. No gross ataxia. LUE MMT grossly 4 to 4+/5. LLE is Grossly 4 to 4+ as well. No sensory deficits appreciated. Mild left central 7. Cognitively with good insight and awareness. STM intact.  Minimal dysmetria L FNF  Assessment/Plan: 1. Functional deficits secondary to right paracentral pontine infarct which require 3+ hours per day of interdisciplinary therapy in a comprehensive inpatient rehab setting. Physiatrist is providing close team supervision and 24 hour management of active medical problems listed below. Physiatrist and rehab team continue to assess barriers to discharge/monitor patient progress toward functional and medical goals. FIM: FIM - Bathing Bathing Steps Patient Completed: Chest;Right Arm;Left Arm;Abdomen;Front perineal area;Buttocks;Right upper leg;Left upper leg;Right lower leg (including foot);Left lower leg (including foot) Bathing: 6: More than reasonable amount of time  FIM - Upper Body Dressing/Undressing Upper body dressing/undressing steps patient completed: Thread/unthread right sleeve of pullover shirt/dresss;Thread/unthread left sleeve of pullover shirt/dress;Put head through opening of pull over shirt/dress;Pull shirt over trunk Upper body dressing/undressing: 5: Supervision: Safety issues/verbal cues FIM - Lower Body Dressing/Undressing Lower body dressing/undressing steps patient completed: Thread/unthread right underwear leg;Thread/unthread left underwear leg;Pull underwear up/down;Thread/unthread right pants leg;Thread/unthread left pants leg;Pull pants up/down;Fasten/unfasten  pants;Don/Doff right sock;Don/Doff left sock;Don/Doff right shoe;Don/Doff left shoe Lower body  dressing/undressing: 5: Supervision: Safety issues/verbal cues  FIM - Toileting Toileting steps completed by patient: Adjust clothing prior to toileting;Performs perineal hygiene;Adjust clothing after toileting Toileting Assistive Devices: Grab bar or rail for support Toileting: 5: Supervision: Safety issues/verbal cues  FIM - Diplomatic Services operational officer Devices: Occupational hygienist Transfers: 5-To toilet/BSC: Supervision (verbal cues/safety issues);5-From toilet/BSC: Supervision (verbal cues/safety issues)  FIM - Banker Devices: Cane;Arm rests Bed/Chair Transfer: 5: Bed > Chair or W/C: Supervision (verbal cues/safety issues);5: Chair or W/C > Bed: Supervision (verbal cues/safety issues)  FIM - Locomotion: Wheelchair Distance: 150 Locomotion: Wheelchair: 0: Activity did not occur FIM - Locomotion: Ambulation Locomotion: Ambulation Assistive Devices: Cane - Quad;Cane - Straight Ambulation/Gait Assistance: 5: Supervision Locomotion: Ambulation: 5: Travels 150 ft or more with supervision/safety issues  Comprehension Comprehension Mode: Auditory Comprehension: 6-Follows complex conversation/direction: With extra time/assistive device  Expression Expression Mode: Verbal Expression: 6-Expresses complex ideas: With extra time/assistive device  Social Interaction Social Interaction: 7-Interacts appropriately with others - No medications needed.  Problem Solving Problem Solving: 6-Solves complex problems: With extra time  Memory Memory: 6-More than reasonable amt of time   Medical Problem List and Plan:  1. DVT Prophylaxis/Anticoagulation: Pharmaceutical: Lovenox  2. Pain Management: Continue tylenol prn for now.  3. Mood: Seems to be appropriate. Will have LCSW follow for evaluation.  4. Neuropsych: This patient Is capable of making  decisions on his/her own behalf.  5. HTN: monitor BP with bid checks. Currently BP on low side--amlodipine-valsartan held. Continue Metoprolol.  6. Dyslipidemia: continue Zocor.  7. Pre Renal Insufficiency: sl improvement today. Push po fluids. Recheck later in the week..  8. Urgency with incontinence: Neurogenic bladder, UMN -no PVR"s recorded  -UA negative, culture neg  -timed voids, offered oxybutnin but pt wants to try this now, start 2.5 qhs-did better with urgency Denies urinary incontinence or frequency PTA despite prostate cancer hx      LOS (Days) 8 A FACE TO FACE EVALUATION WAS PERFORMED  Erick Colace 04/09/2013 8:41 AM

## 2013-04-10 ENCOUNTER — Encounter (HOSPITAL_COMMUNITY): Payer: Medicare Other

## 2013-04-10 ENCOUNTER — Inpatient Hospital Stay (HOSPITAL_COMMUNITY): Payer: Medicare Other | Admitting: Physical Therapy

## 2013-04-10 NOTE — Progress Notes (Signed)
Physical Therapy Session Note  Patient Details  Name: Lucas Green MRN: [id number] Date of Birth: 06/17/1932  Today's Date: 04/10/2013 Time: Session #1:  1132-1201, Session #2: 1430-1530 Time Calculation (min): Session #1: 29 min, Session #2: 60 min  Short Term Goals: Week 1:  PT Short Term Goal 1 (Week 1): = LTG  Skilled Therapeutic Interventions/Progress Updates:    Session #1: This session focused on gait with and without SPC supervision.  Cues for upright posture.  Gait speed without cane: 2.0 ft/sec which puts him as a neighborhood ambulator and normal for stroke population.  Obstacle course navigation with SPC and close supervision: stepping over, around cones, and walking across compliant surface.  Again, cues needed for posture, but pt also at times self correcting posture.  Gait over tiled and carpeted surfaces.  Other than 1-2 mins seated rest break while therapist setting up or breaking down obstacle course, pt stayed on his feet the entire treatment session.  On compliant surface: marches with bil and one hand support, semi tandem stand bil, and feet together stand eyes open and eyes closed.    Session #2: stride right group session focused on gait training with assistive device, Nu Step for leg strengthening and endurance training.  Seated and standing LE there ex, obstacle course navigation, and dynamic gait activities holding onto railing in the hallway.    Therapy Documentation Precautions:  Precautions Precautions: Fall Precaution Comments: Notify physician if increase in NIHSS score of 4 or more, patient c/o severe headache, develops nausea/vomiting, pulse less than 50 or greater than 120, systolic BP less than 100 or greater than or equal to 180, diastolic BP less than 70 or greater than or equal to 105, respiratory rate greater than 30, temperature greater than 101.0 F, blood glucose greater than 180 or less than 60 Restrictions Weight Bearing Restrictions: No   Vital  Signs: Therapy Vitals Pulse Rate: 76 Patient Position, if appropriate: Sitting Pain: Pain Assessment Pain Assessment: No/denies pain Pain Score: 0-No pain   Locomotion : Ambulation Ambulation/Gait Assistance: 5: Supervision   See FIM for current functional status  Therapy/Group: Individual Therapy and Group Therapy  Lonya Johannesen B. Jaleigh Mccroskey, PT, DPT [phone]   04/10/2013, 12:03 PM

## 2013-04-10 NOTE — Progress Notes (Signed)
Occupational Therapy Note  Patient Details  Name: Lucas Green MRN: [id number] Date of Birth: Feb 02, 1932 Today's Date: 04/10/2013  Time: 7:30-8:30am ( .) Pt seen for 1:1 OT session with focus on transfers, standing/activity tolerance and overall safety during ADL routine. Pt supine in bed upon arrival ready to shower and dress. First, pt completed oral hygiene and shaving while standing at sink. Pt then toileted before getting into shower. Attempted shower in standing today to improve activity tolerance. Pt tolerated standing about 5-6 minutes before needing to sit. Pt sat for remainder of shower. Pt dressed sitting EOB, taking his time. Verbal cues required intermittently throughout session for safety, ie: pt doffing shirt while standing without support. No pain reported.   Akanksha Bellmore Hessie Diener 04/10/2013, 12:19 PM

## 2013-04-10 NOTE — Progress Notes (Signed)
Physical Therapy Session Note  Patient Details  Name: Lucas Green MRN: [id number] Date of Birth: [birth year]  Today's Date: 04/10/2013 Time: 4098-1191 Time Calculation (min): 44 min  Short Term Goals: Week 1:  PT Short Term Goal 1 (Week 1): = LTG  Skilled Therapeutic Interventions/Progress Updates:   Patient performing ambulation within room and toilet transfers with no AD and supervision overall.  Continued gait training in controlled environment x 150' x 2 with no AD and supervision overall with improvement in upright posture and increased step and stride length but still presents with decreased L stance time and decreased stability with changing directions.  Performed simulated car transfer training at low car height and mid size SUV height with patient performing mod I with both sequences of sitting first or placing LLE into car first and able to stand mod I.  Continued stair negotiation training for home entry/exit with SPC during one step up/down on 6" step with supervision and patient verbalizing and demonstrating safe sequence.  Discussed with patient technique for transferring from floor to furniture at home if he were to experience a fall and indications for calling EMS.  Patient gave repeat demonstration of floor transfer > mat with supervision overall.  Performed higher level balance training for ankle and hip strategy training standing on balance beam/foam beginning with one UE support on // bars during alternating UE ball taps progressing to no UE support during ball toss with close supervision and intermittent use of // bars to regain balance; transitioned back to bilat UE support during alternating LE marches and single limb stance x 12 reps with supervision.  Returned to room to recliner to eat breakfast.  Tolerated well with some DOE but noted to be holding breath during exercises.  Still with increased difficulty with maintaining LLE extension during balance exercises.  Pt. To be  mod I in room tomorrow.  Therapy Documentation Precautions:  Precautions Precautions: Fall Precaution Comments: Notify physician if increase in NIHSS score of 4 or more, patient c/o severe headache, develops nausea/vomiting, pulse less than 50 or greater than 120, systolic BP less than 100 or greater than or equal to 180, diastolic BP less than 70 or greater than or equal to 105, respiratory rate greater than 30, temperature greater than 101.0 F, blood glucose greater than 180 or less than 60 Restrictions Weight Bearing Restrictions: No Vital Signs: Therapy Vitals Pulse Rate: 76 Patient Position, if appropriate: Sitting Pain: Pain Assessment Pain Assessment: No/denies pain Locomotion : Ambulation Ambulation/Gait Assistance: 5: Supervision   See FIM for current functional status  Therapy/Group: Individual Therapy  Edman Circle [facility] 04/10/2013, 9:54 AM

## 2013-04-10 NOTE — Progress Notes (Signed)
Patient ID: Lucas Green, male   DOB: 12-May-1932, 79 y.o.   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 He stated he'd been feeling generally weak for about 3 days prior to admission. With the onset of his left side weakness on 03/29/2013 he also had slurred speech. He noticed slight numbness in his left upper extremity as well. There is no previous history of stroke nor TIA.Marland Kitchen MRI/MRA brain done revealing acute right infarct paracentral pontine infarct and right VA with occlusion likely chronic. 2D echo with EF 60-65% without wall abnormality. Carotid dopplers without ICA stenosis. Was changed to plavix for ischemic infarct due to SVD Urinary freq, small voids Has incont at noc, no dysuria  Would like to drive to outpt PT/OT post D/C Review of Systems  Genitourinary: Positive for urgency and frequency. Negative for dysuria.  Neurological: Negative for sensory change.  All other systems reviewed and are negative.   Objective: Vital Signs: Blood pressure 149/75, pulse 76, temperature 97.4 F (36.3 C), temperature source Oral, resp. rate 18, height 6\' 2"  (1.88 m), weight 89.767 kg (197 lb 14.4 oz), SpO2 98.00%. No results found. No results found for this basename: WBC, HGB, HCT, PLT,  in the last 72 hours  Recent Labs  04/08/13 0710  CREATININE 1.20   CBG (last 3)  No results found for this basename: GLUCAP,  in the last 72 hours  Wt Readings from Last 3 Encounters:  04/03/13 89.767 kg (197 lb 14.4 oz)  03/29/13 90.266 kg (199 lb)    Physical Exam:  Constitutional: He is oriented to person, place, and time. He appears well-developed and well-nourished.  HENT: dentition intact. Oral mucosa pink and moist  Head: Normocephalic and atraumatic.  Eyes: Pupils are equal, round, and reactive to light.  Neck: Normal range of motion.   Cardiovascular: Normal rate and regular rhythm. SEM appreciated.  Pulmonary/Chest: Effort normal and breath sounds normal. No wheezes or rales.  Abdominal: Soft. Bowel sounds are normal. Non tender Neurological: He is alert and oriented to person, place, and time.  Mild dysarthria. Follows commands without difficulty. Left sided weakness with decreased fine motor coordination noted. No gross ataxia. LUE MMT grossly 4 to 4+/5. LLE is Grossly 4 to 4+ as well. No sensory deficits appreciated. Mild left central 7. Cognitively with good insight and awareness. STM intact.  Minimal dysmetria L FNF  Assessment/Plan: 1. Functional deficits secondary to right paracentral pontine infarct which require 3+ hours per day of interdisciplinary therapy in a comprehensive inpatient rehab setting. Physiatrist is providing close team supervision and 24 hour management of active medical problems listed below. Physiatrist and rehab team continue to assess barriers to discharge/monitor patient progress toward functional and medical goals. FIM: FIM - Bathing Bathing Steps Patient Completed: Chest;Right Arm;Left Arm;Abdomen;Front perineal area;Buttocks;Right upper leg;Left upper leg;Right lower leg (including foot);Left lower leg (including foot) Bathing: 5: Supervision: Safety issues/verbal cues  FIM - Upper Body Dressing/Undressing Upper body dressing/undressing steps patient completed: Thread/unthread right sleeve of pullover shirt/dresss;Thread/unthread left sleeve of pullover shirt/dress;Put head through opening of pull over shirt/dress;Pull shirt over trunk Upper body dressing/undressing: 5: Supervision: Safety issues/verbal cues FIM - Lower Body Dressing/Undressing Lower body dressing/undressing steps patient completed: Thread/unthread right underwear leg;Thread/unthread left underwear leg;Pull underwear up/down;Thread/unthread right pants leg;Thread/unthread left pants leg;Pull pants up/down;Fasten/unfasten  pants;Don/Doff right sock;Don/Doff left sock;Don/Doff right shoe;Don/Doff left shoe;Fasten/unfasten right shoe;Fasten/unfasten left shoe  Lower body dressing/undressing: 5: Supervision: Safety issues/verbal cues  FIM - Toileting Toileting steps completed by patient: Adjust clothing prior to toileting;Performs perineal hygiene;Adjust clothing after toileting Toileting Assistive Devices: Grab bar or rail for support Toileting: 5: Supervision: Safety issues/verbal cues  FIM - Diplomatic Services operational officer Devices: Grab bars Toilet Transfers: 5-To toilet/BSC: Supervision (verbal cues/safety issues);5-From toilet/BSC: Supervision (verbal cues/safety issues)  FIM - Banker Devices: Teacher, music: 6: Supine > Sit: No assist;5: Bed > Chair or W/C: Supervision (verbal cues/safety issues);6: Sit > Supine: No assist;5: Chair or W/C > Bed: Supervision (verbal cues/safety issues)  FIM - Locomotion: Wheelchair Distance: 150 Locomotion: Wheelchair: 0: Activity did not occur FIM - Locomotion: Ambulation Locomotion: Ambulation Assistive Devices: Emergency planning/management officer Ambulation/Gait Assistance: 5: Supervision Locomotion: Ambulation: 5: Travels 150 ft or more with supervision/safety issues  Comprehension Comprehension Mode: Auditory Comprehension: 6-Follows complex conversation/direction: With extra time/assistive device  Expression Expression Mode: Verbal Expression: 5-Expresses complex 90% of the time/cues < 10% of the time  Social Interaction Social Interaction: 7-Interacts appropriately with others - No medications needed.  Problem Solving Problem Solving: 6-Solves complex problems: With extra time  Memory Memory: 6-More than reasonable amt of time   Medical Problem List and Plan:  1. DVT Prophylaxis/Anticoagulation: Pharmaceutical: Lovenox  2. Pain Management: Continue tylenol prn for now.  3. Mood: Seems to be appropriate. Will  have LCSW follow for evaluation.  4. Neuropsych: This patient Is capable of making decisions on his/her own behalf.  5. HTN: monitor BP with bid checks. Currently BP on low side--amlodipine-valsartan held. Continue Metoprolol.  6. Dyslipidemia: continue Zocor.  7. Pre Renal Insufficiency: sl improvement today. Push po fluids. Recheck later in the week..  8. Urgency with incontinence: Neurogenic bladder, UMN -no PVR"s recorded  -UA negative, culture neg  -timed voids, offered oxybutnin but pt wants to try this now, start 2.5 qhs-did better with urgency 9.  Aortic stenosis progressed since 2010     LOS (Days) 9 A FACE TO FACE EVALUATION WAS PERFORMED  Erick Colace 04/10/2013 10:31 AM

## 2013-04-10 NOTE — Patient Care Conference (Signed)
Inpatient RehabilitationTeam Conference and Plan of Care Update Date: 04/10/2013   Time: 10:45 AM    Patient Name: Lucas Green      Medical Record Number: [id number]  Date of Birth: 07-13-1932 Sex: Male         Room/Bed: 4153/4153-01 Payor Info: Payor: MEDICARE  Plan: MEDICARE PART A AND B  Product Type: *No Product type*     Admitting Diagnosis: RT CVA  Admit Date/Time:  04/01/2013  6:49 PM Admission Comments: No comment available   Primary Diagnosis:  Brainstem infarct, acute Principal Problem: Brainstem infarct, acute  Patient Active Problem List   Diagnosis Date Noted  . Renal insufficiency 04/02/2013  . Other and unspecified hyperlipidemia 04/01/2013  . Leukocytosis, unspecified 04/01/2013  . Hypertension 03/29/2013  . Brainstem infarct, acute 03/29/2013    Expected Discharge Date: Expected Discharge Date: 04/12/13  Team Members Present: Physician leading conference: Dr. Claudette Laws Social Worker Present: Dossie Der, LCSW Nurse Present: Chana Bode, RN PT Present: Edman Circle, PT;Becky Henrene Dodge, PT;Caroline Adriana Simas, PT;Other (comment) Clarisse Gouge Ripa-PT) OT Present: Other (comment);Kris Gellert, OT Dorathy Daft Perkinson-OT) SLP Present: Fae Pippin, SLP Other (Discipline and Name): Charolette Child & Steve-director     Current Status/Progress Goal Weekly Team Focus  Medical   High-level deficits which are improving.  Establish followup care for discharge this week  Fall recovery techniques, discharge planning   Bowel/Bladder   continent of bladder and bowel.   remain continent      Swallow/Nutrition/ Hydration     na        ADL's   supervision overall   Mod I  balance, higher level tasks, family education   Mobility   Supervision-min A overall  Mod I overall  Balance, gait, postural control    Communication     na        Safety/Cognition/ Behavioral Observations    na        Pain   denies pain  pain less than 2  assess pain and medicate prn   Skin   no skin breakdown            *See Care Plan and progress notes for long and short-term goals.  Barriers to Discharge: Safety, urinary incontinence    Possible Resolutions to Barriers:  Medication management for urinary problems, additional training.    Discharge Planning/Teaching Needs:       Home with wife who can provide supervision level.  Doing well and discharge soon  Team Discussion:  Progressing well, wife here to observe in therapies.  Wants to drive and MD addressing this.  Revisions to Treatment Plan:  Discharge Friday   Continued Need for Acute Rehabilitation Level of Care: The patient requires daily medical management by a physician with specialized training in physical medicine and rehabilitation for the following conditions: Daily direction of a multidisciplinary physical rehabilitation program to ensure safe treatment while eliciting the highest outcome that is of practical value to the patient.: Yes Daily medical management of patient stability for increased activity during participation in an intensive rehabilitation regime.: Yes Daily analysis of laboratory values and/or radiology reports with any subsequent need for medication adjustment of medical intervention for : Neurological problems  Kaleia Longhi, Lemar Livings 04/10/2013, 3:28 PM

## 2013-04-11 ENCOUNTER — Inpatient Hospital Stay (HOSPITAL_COMMUNITY): Payer: Medicare Other

## 2013-04-11 ENCOUNTER — Encounter (HOSPITAL_COMMUNITY): Payer: Medicare Other | Admitting: Occupational Therapy

## 2013-04-11 ENCOUNTER — Inpatient Hospital Stay (HOSPITAL_COMMUNITY): Payer: Medicare Other | Admitting: Physical Therapy

## 2013-04-11 ENCOUNTER — Inpatient Hospital Stay (HOSPITAL_COMMUNITY): Payer: Medicare Other | Admitting: *Deleted

## 2013-04-11 DIAGNOSIS — R32 Unspecified urinary incontinence: Secondary | ICD-10-CM

## 2013-04-11 DIAGNOSIS — C61 Malignant neoplasm of prostate: Secondary | ICD-10-CM

## 2013-04-11 DIAGNOSIS — I10 Essential (primary) hypertension: Secondary | ICD-10-CM

## 2013-04-11 DIAGNOSIS — I6330 Cerebral infarction due to thrombosis of unspecified cerebral artery: Secondary | ICD-10-CM

## 2013-04-11 MED ORDER — ENSURE COMPLETE PO LIQD
120.0000 mL | Freq: Four times a day (QID) | ORAL | Status: DC
  Administered 2013-04-11: 120 mL via ORAL

## 2013-04-11 NOTE — Progress Notes (Signed)
Patient ID: Lucas Green, male   DOB: 1932/01/01, 79 y.o.   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 He stated he'd been feeling generally weak for about 3 days prior to admission. With the onset of his left side weakness on 03/29/2013 he also had slurred speech. He noticed slight numbness in his left upper extremity as well. There is no previous history of stroke nor TIA.Marland Kitchen MRI/MRA brain done revealing acute right infarct paracentral pontine infarct and right VA with occlusion likely chronic. 2D echo with EF 60-65% without wall abnormality. Carotid dopplers without ICA stenosis. Was changed to plavix for ischemic infarct due to SVD Urinary freq, small voids Has incont at noc, no dysuria  Would like to drive to outpt PT/OT post D/C, we discussed this with team no concerns Review of Systems  Genitourinary: Positive for urgency and frequency. Negative for dysuria.  Neurological: Negative for sensory change.  All other systems reviewed and are negative.   Objective: Vital Signs: Blood pressure 165/73, pulse 59, temperature 97.3 F (36.3 C), temperature source Axillary, resp. rate 17, height 6\' 2"  (1.88 m), weight 90.447 kg (199 lb 6.4 oz), SpO2 98.00%. No results found. No results found for this basename: WBC, HGB, HCT, PLT,  in the last 72 hours No results found for this basename: NA, K, CL, CO, GLUCOSE, BUN, CREATININE, CALCIUM,  in the last 72 hours CBG (last 3)  No results found for this basename: GLUCAP,  in the last 72 hours  Wt Readings from Last 3 Encounters:  04/10/13 90.447 kg (199 lb 6.4 oz)  03/29/13 90.266 kg (199 lb)    Physical Exam:  Constitutional: He is oriented to person, place, and time. He appears well-developed and well-nourished.  HENT: dentition intact. Oral mucosa pink and moist  Head: Normocephalic and atraumatic.   Eyes: Pupils are equal, round, and reactive to light.  Neck: Normal range of motion.  Cardiovascular: Normal rate and regular rhythm. SEM appreciated.  Pulmonary/Chest: Effort normal and breath sounds normal. No wheezes or rales.  Abdominal: Soft. Bowel sounds are normal. Non tender Neurological: He is alert and oriented to person, place, and time.  Mild dysarthria. Follows commands without difficulty. Left sided weakness with decreased fine motor coordination noted. No gross ataxia. LUE MMT grossly 4 to 4+/5. LLE is Grossly 4 to 4+ as well. No sensory deficits appreciated. Mild left central 7. Cognitively with good insight and awareness. STM intact.  Minimal dysmetria L FNF  Assessment/Plan: 1. Functional deficits secondary to right paracentral pontine infarct which require 3+ hours per day of interdisciplinary therapy in a comprehensive inpatient rehab setting. Physiatrist is providing close team supervision and 24 hour management of active medical problems listed below. Physiatrist and rehab team continue to assess barriers to discharge/monitor patient progress toward functional and medical goals. FIM: FIM - Bathing Bathing Steps Patient Completed: Chest;Right Arm;Left Arm;Abdomen;Front perineal area;Buttocks;Right upper leg;Left upper leg;Right lower leg (including foot);Left lower leg (including foot) Bathing: 5: Supervision: Safety issues/verbal cues  FIM - Upper Body Dressing/Undressing Upper body dressing/undressing steps patient completed: Thread/unthread right sleeve of pullover shirt/dresss;Thread/unthread left sleeve of pullover shirt/dress;Put head through opening of pull over shirt/dress;Pull shirt over trunk Upper body dressing/undressing: 5: Supervision: Safety issues/verbal cues FIM - Lower Body Dressing/Undressing Lower body dressing/undressing steps patient completed: Thread/unthread right underwear leg;Thread/unthread left underwear leg;Pull underwear  up/down;Thread/unthread right pants leg;Thread/unthread left pants  leg;Pull pants up/down;Fasten/unfasten pants;Don/Doff right sock;Don/Doff left sock;Don/Doff right shoe;Don/Doff left shoe;Fasten/unfasten right shoe;Fasten/unfasten left shoe Lower body dressing/undressing: 5: Supervision: Safety issues/verbal cues  FIM - Toileting Toileting steps completed by patient: Adjust clothing prior to toileting;Performs perineal hygiene;Adjust clothing after toileting Toileting Assistive Devices: Grab bar or rail for support Toileting: 5: Supervision: Safety issues/verbal cues  FIM - Diplomatic Services operational officer Devices: Grab bars Toilet Transfers: 6-To toilet/ BSC;6-From toilet/BSC  FIM - Banker Devices: Teacher, music: 6: Supine > Sit: No assist;5: Bed > Chair or W/C: Supervision (verbal cues/safety issues);6: Sit > Supine: No assist;5: Chair or W/C > Bed: Supervision (verbal cues/safety issues)  FIM - Locomotion: Wheelchair Distance: 150 Locomotion: Wheelchair: 0: Activity did not occur FIM - Locomotion: Ambulation Locomotion: Ambulation Assistive Devices: Emergency planning/management officer Ambulation/Gait Assistance: 5: Supervision Locomotion: Ambulation: 5: Travels 150 ft or more with supervision/safety issues  Comprehension Comprehension Mode: Auditory Comprehension: 6-Follows complex conversation/direction: With extra time/assistive device  Expression Expression Mode: Verbal Expression: 6-Expresses complex ideas: With extra time/assistive device  Social Interaction Social Interaction: 7-Interacts appropriately with others - No medications needed.  Problem Solving Problem Solving: 6-Solves complex problems: With extra time  Memory Memory: 6-More than reasonable amt of time   Medical Problem List and Plan:  1. DVT Prophylaxis/Anticoagulation: Pharmaceutical: Lovenox  2. Pain Management: Continue tylenol prn for now.  3. Mood:  Seems to be appropriate. Will have LCSW follow for evaluation.  4. Neuropsych: This patient Is capable of making decisions on his/her own behalf.  5. HTN: monitor BP with bid checks. Currently BP on low side--amlodipine-valsartan held. Continue Metoprolol.  6. Dyslipidemia: continue Zocor.  7. Pre Renal Insufficiency: sl improvement today. Push po fluids. Recheck later in the week..  8. Urgency with incontinence: Neurogenic bladder, UMN -no PVR"s recorded  -UA negative, culture neg  -timed voids,  oxybutnin  2.5 qhs-did better with urgency 9.  Aortic stenosis progressed since 2010, no sign of CHF     LOS (Days) 10 A FACE TO FACE EVALUATION WAS PERFORMED  Erick Colace 04/11/2013 8:38 AM

## 2013-04-11 NOTE — Progress Notes (Signed)
Occupational Therapy Session Note  Patient Details  Name: Lucas Green MRN: [id number] Date of Birth: 17-Aug-1932  Today's Date: 04/11/2013 Time: 1300-1330 Time Calculation (min): 30 min  Short Term Goals: STGs=LTGs  Skilled Therapeutic Interventions/Progress Updates:    Therapy session focused on dynamic standing balance, activity tolerance, postural control, and weight shifting. Pt supine in bed upon arrival and requested to complete toileting task. Completed at Mod I. Ambulated to therapy gym with straight cane and no LOB. Participated in dynamic balance activity of retrieving items from floor requiring him to squat down to retrieve and come to standing. Completed 2 trials with supervision and rest breaks. Participated in postural control and weight shifting activity while standing unsupported to softly kick ball shifting weight from each LE. Pt reported feeling more unstable when kicking with LLE however had no lob during activity. 2 rest breaks taken during this task then pt ambulated back to room with [place]. Pt reported no questions or concerns about discharge.   Therapy Documentation Precautions:  Precautions Precautions: Fall Precaution Comments: Notify physician if increase in NIHSS score of 4 or more, patient c/o severe headache, develops nausea/vomiting, pulse less than 50 or greater than 120, systolic BP less than 100 or greater than or equal to 180, diastolic BP less than 70 or greater than or equal to 105, respiratory rate greater than 30, temperature greater than 101.0 F, blood glucose greater than 180 or less than 60 Restrictions Weight Bearing Restrictions: No General:   Vital Signs:   Pain: No c/o pain during therapy session.   See FIM for current functional status  Therapy/Group: Individual Therapy  Daneil Dan 04/11/2013, 2:49 PM

## 2013-04-11 NOTE — Progress Notes (Signed)
Physical Therapy Session Note  Patient Details  Name: Lucas Green MRN: [id number] Date of Birth: 07-04-1932  Today's Date: 04/11/2013 Time: 1430-1530 Time Calculation (min): 60 min  Short Term Goals: Week 1:  PT Short Term Goal 1 (Week 1): = LTG  Skilled Therapeutic Interventions/Progress Updates:     Pt engaged in stride right group working on leg strengthening exercises, Nu Step for endurance and strength training, and group static and dynamic balance activities standing at the mat table.  Gait with SPC to and from room 150'x 2 with mod I.  Berg repeated 46/56.     Therapy Documentation Precautions:  Precautions Precautions: Fall Precaution Comments: Notify physician if increase in NIHSS score of 4 or more, patient c/o severe headache, develops nausea/vomiting, pulse less than 50 or greater than 120, systolic BP less than 100 or greater than or equal to 180, diastolic BP less than 70 or greater than or equal to 105, respiratory rate greater than 30, temperature greater than 101.0 F, blood glucose greater than 180 or less than 60 Restrictions Weight Bearing Restrictions: No General:   Vital Signs: Therapy Vitals Temp: 97.6 F (36.4 C) Temp src: Oral Pulse Rate: 64 Resp: 17 BP: 130/74 mmHg Patient Position, if appropriate: Lying Oxygen Therapy SpO2: 100 % O2 Device: None (Room air)    Mobility: Bed Mobility Supine to Sit: 7: Independent Transfers Sit to Stand: 6: Modified independent (Device/Increase time) Stand to Sit: 6: Modified independent (Device/Increase time) Locomotion : Ambulation Ambulation/Gait Assistance: 6: Modified independent (Device/Increase time) Ambulation Distance (Feet): 150 Feet Assistive device: Straight cane Ambulation/Gait Assistance Details: Pt has decreased posture and step length during gait, but is able to demonstrate functionally safe gait.  Stairs / Additional Locomotion Stairs: Yes Stairs Assistance: 6: Modified independent  (Device/Increase time) Stairs Assistance Details (indicate cue type and reason): None Stair Management Technique: One rail Left;With cane Number of Stairs: 13 Height of Stairs: 6 Wheelchair Mobility Wheelchair Mobility: No     Balance: Hospital doctor Sit to Stand: Able to stand without using hands and stabilize independently Standing Unsupported: Able to stand safely 2 minutes Sitting with Back Unsupported but Feet Supported on Floor or Stool: Able to sit safely and securely 2 minutes Stand to Sit: Sits safely with minimal use of hands Transfers: Able to transfer safely, definite need of hands Standing Unsupported with Eyes Closed: Able to stand 10 seconds with supervision Standing Ubsupported with Feet Together: Able to place feet together independently and stand 1 minute safely From Standing, Reach Forward with Outstretched Arm: Can reach forward >12 cm safely (5") From Standing Position, Pick up Object from Floor: Able to pick up shoe, needs supervision From Standing Position, Turn to Look Behind Over each Shoulder: Looks behind from both sides and weight shifts well Turn 360 Degrees: Able to turn 360 degrees safely but slowly Standing Unsupported, Alternately Place Feet on Step/Stool: Able to stand independently and complete 8 steps >20 seconds Standing Unsupported, One Foot in Front: Able to plae foot ahead of the other independently and hold 30 seconds Standing on One Leg: Able to lift leg independently and hold equal to or more than 3 seconds Total Score: 46  See FIM for current functional status  Therapy/Group: Group Therapy  Lucas Green Yerby, PT, DPT [phone]   04/11/2013, 4:55 PM

## 2013-04-11 NOTE — Progress Notes (Signed)
Occupational Therapy Note  Patient Details  Name: Lucas Green MRN: [id number] Date of Birth: 1932-11-06 Today's Date: 04/11/2013  Time In:  0950 Time Out: 10:40. Individual session with focus on ADL at shower and standing level. Patient able to gather clothes independently without use of cane, toilet and complete shower transfer with grab bar and shower seat only. Patient modified independent with showering as he uses a shower seat due to decreased activity tolerance.  Patient able to stand intermittently for up to 6-7 minutes but prefers to sit.  Discussed standing in shower as a way to increase activity tolerance within a functional activity.  Patient completely independent with dressing as well as grooming while standing at the sink.  Discussed impact of fatigue on balance and patient able to state he feels his balance worsen when he is tired.  Patient very happy with progress and looking forward to outpatient therapies to become independent in the community and eventually return to work.    Norton Pastel 04/11/2013, 11:31 AM

## 2013-04-11 NOTE — Progress Notes (Signed)
Physical Therapy Session Note  Patient Details  Name: Lucas Green MRN: [id number] Date of Birth: [birth year]  Today's Date: 04/11/2013 Time:  9:02-9:45 ( )   Short Term Goals: Week 1:  PT Short Term Goal 1 (Week 1): = LTG  Skilled Therapeutic Interventions/Progress Updates:  Tx focused on community gait and mobility with SPC. Pt able to demonstrate Mod I with mobility and gait >200' in controlled environment, but needing S in community and outdoors >300' due to uneven terrains.  Pt was able to navigate elevator doors with safe timing, demonstrate quick stop/starts, and gait with head turns and busy environments with minimal LOB and independent with safe recovery. Pt ambulated on brick, inclines/declines, and grass. Ascend/descended 13 stairs with 1 rail and S with cane and alternating between reciprocal and step-to patterns due to fatigue. Pt needing 3 seated rest breaks due to fatigue.  Pt able to demonstrate Mod I balance during functional tasks, in room mobility, and picking up objects from floor.  Pt Mod I for transfers with increased effort from variety of heights and surfaces.      Therapy Documentation Precautions:  Precautions Precautions: Fall Precaution Comments: Notify physician if increase in NIHSS score of 4 or more, patient c/o severe headache, develops nausea/vomiting, pulse less than 50 or greater than 120, systolic BP less than 100 or greater than or equal to 180, diastolic BP less than 70 or greater than or equal to 105, respiratory rate greater than 30, temperature greater than 101.0 F, blood glucose greater than 180 or less than 60 Restrictions Weight Bearing Restrictions: No    Pain: None   See FIM for current functional status  Therapy/Group: Individual Therapy Clydene Laming, PT, DPT 04/11/2013, 9:39 AM

## 2013-04-11 NOTE — Progress Notes (Signed)
Occupational Therapy Discharge Summary  Patient Details  Name: Lucas Green MRN: [id number] Date of Birth: 09-30-1932  Today's Date: 04/11/2013 Time: 0950-1040 Time Calculation (min): 50 min  Patient has met 13 of 13 long term goals due to improved activity tolerance, improved balance and postural control.  Patient to discharge at overall Independent level.  Patient is independent in basic self care and mobility.  Care partner will need to assist with IADL's at discharge only.    Reasons goals not met:  NA  Recommendation:  Patient will benefit from ongoing skilled OT services in outpatient setting to continue to advance functional skills in the area of iADL.  Equipment:  Shower seat  Reasons for discharge: treatment goals met  Patient/family agrees with progress made and goals achieved: Yes  OT Discharge Precautions/Restrictions    General   Vital Signs Therapy Vitals Pulse Rate: 80 BP: 130/73 mmHg Pain Pain Assessment Pain Assessment: No/denies pain ADL - independent or modified independent for all activities.   See FIM for specifics.  Vision/Perception  Vision - History Baseline Vision: No visual deficits  Cognition Overall Cognitive Status: Within Functional Limits for tasks assessed Sensation Sensation Light Touch: Appears Intact Stereognosis: Appears Intact Hot/Cold: Appears Intact Proprioception: Appears Intact Coordination Gross Motor Movements are Fluid and Coordinated: Yes Fine Motor Movements are Fluid and Coordinated: Yes Motor  Motor Motor: Hemiplegia Motor - Discharge Observations:  (mild) Mobility     Trunk/Postural Assessment     Balance Balance Balance Assessed: Yes (WFL for basic functional tasks) Extremity/Trunk Assessment RUE Assessment RUE Assessment: Within Functional Limits LUE Assessment LUE Assessment: Within Functional Limits  See FIM for current functional status  Norton Pastel 04/11/2013, 11:25 AM

## 2013-04-11 NOTE — Progress Notes (Signed)
Physical Therapy Discharge Summary  Patient Details  Name: Lucas Green MRN: [id number] Date of Birth: 30-Apr-1932  Today's Date: 04/11/2013  Patient has met 6 of 6 long term goals due to improved activity tolerance, improved balance, improved postural control and improved coordination.  Pt continues to have difficulty with strength and balance, especially with fatigue. Patient to discharge at an ambulatory level Modified Independent.   Patient's wife is able to provide the necessary supervision assistance at discharge.  Recommendation:  Patient will benefit from ongoing skilled PT services in outpatient setting to continue to advance safe functional mobility, address ongoing impairments in strength, balance, and minimize fall risk.  Equipment: Single point cane  Reasons for discharge: treatment goals met  Patient/family agrees with progress made and goals achieved: Yes  PT Discharge Precautions/Restrictions Restrictions Weight Bearing Restrictions: No    Pain - none   Vision/Perception  Vision - History Baseline Vision: No visual deficits Vision - Assessment Eye Alignment: Within Functional Limits Perception Perception: Within Functional Limits Praxis Praxis: Intact  Cognition Overall Cognitive Status: Within Functional Limits for tasks assessed Sensation Sensation Light Touch: Appears Intact Coordination Gross Motor Movements are Fluid and Coordinated: Yes Fine Motor Movements are Fluid and Coordinated: Yes Motor  Motor Motor: Hemiplegia Motor - Discharge Observations: Residual weakness on L, especuially HS as well as decreased toe tap coordination with increased speeds.   Mobility Bed Mobility Supine to Sit: 7: Independent Transfers Sit to Stand: 6: Modified independent (Device/Increase time) Stand to Sit: 6: Modified independent (Device/Increase time) Locomotion  Ambulation Ambulation/Gait Assistance: 6: Modified independent (Device/Increase  time) Ambulation Distance (Feet): 150 Feet Assistive device: Straight cane Ambulation/Gait Assistance Details: Pt has decreased posture and step length during gait, but is able to demonstrate functionally safe gait.  Stairs / Additional Locomotion Stairs: Yes Stairs Assistance: 6: Modified independent (Device/Increase time) Stairs Assistance Details (indicate cue type and reason): None Stair Management Technique: One rail Left;With cane Number of Stairs: 13 Height of Stairs: 6 Wheelchair Mobility Wheelchair Mobility: No      Balance - See afternoon PT note for final Berg Balance test   Extremity Assessment      RLE Assessment RLE Assessment: Within Functional Limits LLE Assessment LLE Assessment: Exceptions to [facility] LLE Strength LLE Overall Strength: Deficits LLE Overall Strength Comments: 4-/5 throughout, poor muscular endurance with gait   See FIM for current functional status  Clydene Laming, PT, DPT  04/11/2013, 3:26 PM

## 2013-04-12 DIAGNOSIS — I6330 Cerebral infarction due to thrombosis of unspecified cerebral artery: Secondary | ICD-10-CM

## 2013-04-12 DIAGNOSIS — I10 Essential (primary) hypertension: Secondary | ICD-10-CM

## 2013-04-12 DIAGNOSIS — R32 Unspecified urinary incontinence: Secondary | ICD-10-CM

## 2013-04-12 DIAGNOSIS — C61 Malignant neoplasm of prostate: Secondary | ICD-10-CM

## 2013-04-12 MED ORDER — SIMVASTATIN 20 MG PO TABS: 20.0000 mg | ORAL_TABLET | Freq: Every day | ORAL | Status: DC

## 2013-04-12 MED ORDER — CLOPIDOGREL BISULFATE 75 MG PO TABS: 75.0000 mg | ORAL_TABLET | Freq: Every day | ORAL | Status: DC

## 2013-04-12 MED ORDER — OXYBUTYNIN CHLORIDE 5 MG PO TABS: 2.5000 mg | ORAL_TABLET | Freq: Every evening | ORAL | Status: DC | PRN

## 2013-04-12 NOTE — Progress Notes (Signed)
Patient ID: Lucas Green, male   DOB: 10/04/1932, 79 y.o.   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 He stated he'd been feeling generally weak for about 3 days prior to admission. With the onset of his left side weakness on 03/29/2013 he also had slurred speech. He noticed slight numbness in his left upper extremity as well. There is no previous history of stroke nor TIA.Marland Kitchen MRI/MRA brain done revealing acute right infarct paracentral pontine infarct and right VA with occlusion likely chronic. 2D echo with EF 60-65% without wall abnormality. Carotid dopplers without ICA stenosis. Was changed to plavix for ischemic infarct due to SVD Urinary freq, small voids Has incont at noc, no dysuria  Feels ok Review of Systems  Genitourinary: Positive for urgency and frequency. Negative for dysuria.  Neurological: Negative for sensory change.  All other systems reviewed and are negative.   Objective: Vital Signs: Blood pressure 118/67, pulse 52, temperature 97.4 F (36.3 C), temperature source Oral, resp. rate 17, height 6\' 2"  (1.88 m), weight 90.447 kg (199 lb 6.4 oz), SpO2 98.00%. No results found. No results found for this basename: WBC, HGB, HCT, PLT,  in the last 72 hours No results found for this basename: NA, K, CL, CO, GLUCOSE, BUN, CREATININE, CALCIUM,  in the last 72 hours CBG (last 3)  No results found for this basename: GLUCAP,  in the last 72 hours  Wt Readings from Last 3 Encounters:  04/10/13 90.447 kg (199 lb 6.4 oz)  03/29/13 90.266 kg (199 lb)    Physical Exam:  Constitutional: He is oriented to person, place, and time. He appears well-developed and well-nourished.  HENT: dentition intact. Oral mucosa pink and moist  Head: Normocephalic and atraumatic.  Eyes: Pupils are equal, round, and reactive to light.  Neck: Normal range of  motion.  Cardiovascular: Normal rate and regular rhythm. SEM appreciated.  Pulmonary/Chest: Effort normal and breath sounds normal. No wheezes or rales.  Abdominal: Soft. Bowel sounds are normal. Non tender Neurological: He is alert and oriented to person, place, and time.  Mild dysarthria. Follows commands without difficulty. Left sided weakness with decreased fine motor coordination noted. No gross ataxia. LUE MMT grossly 4 to 4+/5. LLE is Grossly 4 to 4+ as well. No sensory deficits appreciated. Mild left central 7. Cognitively with good insight and awareness. STM intact.  Minimal dysmetria L FNF  Assessment/Plan: 1. Functional deficits secondary to right paracentral pontine infarct. Stable for D/C today F/u PCP in 1-2 weeks F/u PM&R 3 weeks See D/C summary See D/C instructionsFIM: FIM - Bathing Bathing Steps Patient Completed: Chest;Right Arm;Left Arm;Buttocks;Front perineal area;Abdomen;Right upper leg;Left lower leg (including foot);Left upper leg;Right lower leg (including foot) Bathing: 7: Complete Independence: No helper  FIM - Upper Body Dressing/Undressing Upper body dressing/undressing steps patient completed: Thread/unthread right sleeve of pullover shirt/dresss;Thread/unthread left sleeve of pullover shirt/dress;Put head through opening of pull over shirt/dress;Pull shirt over trunk Upper body dressing/undressing: 7: Complete Independence: No helper (pt gathered clothes independently no device) FIM - Lower Body Dressing/Undressing Lower body dressing/undressing steps patient completed: Thread/unthread right underwear leg;Thread/unthread left underwear leg;Thread/unthread right pants leg;Pull underwear up/down;Thread/unthread left pants leg;Pull pants up/down;Don/Doff right sock;Fasten/unfasten pants;Don/Doff left sock;Don/Doff right shoe;Fasten/unfasten right shoe;Don/Doff left shoe;Fasten/unfasten left shoe Lower body dressing/undressing: 7: Complete Independence: No helper (pt  gathered clothes independently no device)  FIM - Toileting Toileting steps completed by  patient: Adjust clothing prior to toileting;Performs perineal hygiene;Adjust clothing after toileting Toileting Assistive Devices: Grab bar or rail for support Toileting: 7: Independent: No helper, no device  FIM - Diplomatic Services operational officer Devices: Grab bars Toilet Transfers: 7-Independent: No helper  FIM - Banker Devices: Arm rests;Cane Bed/Chair Transfer: 7: Independent: No helper (only uses cane outside)  FIM - Locomotion: Wheelchair Distance: 150 Locomotion: Wheelchair: 0: Activity did not occur FIM - Locomotion: Ambulation Locomotion: Ambulation Assistive Devices: Emergency planning/management officer Ambulation/Gait Assistance: 6: Modified independent (Device/Increase time) Locomotion: Ambulation: 5: Household Independent - travels 50 - 149 ft independent or modified independent  Comprehension Comprehension Mode: Auditory Comprehension: 7-Follows complex conversation/direction: With no assist  Expression Expression Mode: Verbal Expression: 7-Expresses complex ideas: With no assist  Social Interaction Social Interaction: 7-Interacts appropriately with others - No medications needed.  Problem Solving Problem Solving: 7-Solves complex problems: Recognizes & self-corrects  Memory Memory: 7-Complete Independence: No helper   Medical Problem List and Plan:  1. DVT Prophylaxis/Anticoagulation: Pharmaceutical: Lovenox  2. Pain Management: Continue tylenol prn for now.  3. Mood: Seems to be appropriate. Will have LCSW follow for evaluation.  4. Neuropsych: This patient Is capable of making decisions on his/her own behalf.  5. HTN: monitor BP with bid checks. Currently BP on low side--amlodipine-valsartan held. Continue Metoprolol.  6. Dyslipidemia: continue Zocor.  7. Pre Renal Insufficiency: sl improvement today. Push po fluids. Recheck later in  the week..  8. Urgency with incontinence: Neurogenic bladder, UMN -no PVR"s recorded  -UA negative, culture neg  -timed voids,  oxybutnin  2.5 qhs-did better with urgency 9.  Aortic stenosis progressed since 2010, no sign of CHF     LOS (Days) 11 A FACE TO FACE EVALUATION WAS PERFORMED  Erick Colace 04/12/2013 8:34 AM

## 2013-04-12 NOTE — Progress Notes (Signed)
Patient discharge to home with family at 66.  Discharge instruction provided by Delle Reining, PA.  Patient verbalize understanding, no further questions ask.

## 2013-04-12 NOTE — Discharge Summary (Signed)
Physician Discharge Summary  Patient ID: Lucas Green MRN: [id number] DOB/AGE: [birth year] 79 y.o.  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 He was changed to plavix for ischemic infarct due to SVD. He continued with mild dysarthria with left sided weakness and therapy team recommended CIR for progressive therapies.     Hospital Course: Lucas Green was admitted to rehab 04/01/2013 for inpatient therapies to consist of PT, ST and OT at least three hours five days a week. Past admission physiatrist, therapy team and rehab RN have worked together to provide customized collaborative inpatient rehab. Blood pressures were monitored on tid basis and have been reasonably controlled off Exforge. Po intake has been good. He has had urinary frequency due to neurogenic bladder. Urine culture was negative and PVR showed no signs of retention. Therefore, he was started on ditropan and has had improvement in symptoms. Labs checked past admission showed mild renal insufficiency with BUN at 27 and he was encouraged to push po fluids. Mood has been stable and he has made great progress during his stay.    Rehab course: During patient's stay in rehab weekly team conferences were held to  monitor patient's progress, set goals and discuss barriers to discharge. Speech therapy- Occupational therapy has worked with patient on ADL tasks. He is independent for showering with use of shower chair as well as dressing tasks. Physical therapy has worked with patient on mobility, balance and strengthening. His BERG score has improved to 46/56. He is modified independent for transfers, mobility and stair navigation. Outpatient PT to continue to work on balance and strengthening.    Disposition: 01-Home or Self Care   Diet: Heart healthy.   Special Instructions: Outpatient PT  at [facility].  Appointment Date/Time:  04/16/13 @ 9:00 am     Future Appointments Provider Department Dept Phone   04/26/2013 10:45 AM Erick Colace, MD [phone]       Medication List    STOP taking these medications       amLODipine-valsartan 5-320 MG per tablet  Commonly known as:  EXFORGE     aspirin EC 81 MG tablet      TAKE these medications       clopidogrel 75 MG tablet  Commonly known as:  PLAVIX  Take 1 tablet (75 mg total) by mouth daily with breakfast.     metoprolol succinate 50 MG 24 hr tablet  Commonly known as:  TOPROL-XL  Take 25 mg by mouth daily. Take with or immediately following a meal.     multivitamin with minerals Tabs  Take 1 tablet by mouth daily.     oxybutynin 5 MG tablet  Commonly known as:  DITROPAN  Take 0.5 tablets (2.5 mg total)  by mouth at bedtime as needed.     pantoprazole 40 MG tablet  Commonly known as:  PROTONIX  Take 40 mg by mouth daily.     simvastatin 20 MG tablet  Commonly known as:  ZOCOR  Take 1 tablet (20 mg total) by mouth daily at 6 PM.           Follow-up Information   Follow up with Erick Colace, MD On 04/26/2013. (Be there at 10:15 for 10:45 appointment)    Contact information:   [address] [place] [address] [phone]       Follow up with Dina Rich, MD  On 04/24/2013. (Appointment at 2:15 pm)    Contact information:   [address] [place] [address] [phone]       Follow up with Gates Rigg, MD. Call today. (for follow up in 2 months. )    Contact information:   [address] [place] [address] [phone]       Signed: Jacquelynn Cree 04/12/2013, 1:28 PM

## 2013-04-12 NOTE — Progress Notes (Signed)
Social Work  Discharge Note  The overall goal for the admission was met for:   Discharge location: Yes - home with wife to assist as needed  Length of Stay: Yes - 11 days  Discharge activity level: Yes - independent  Home/community participation: Yes  Services provided included: MD, RD, PT, OT, RN, TR, Pharmacy and SW  Financial Services: Medicare  Follow-up services arranged: Outpatient: PT via [facility], DME: cane via [facility]  and Patient/Family has no preference for HH/DME agencies  Comments (or additional information):  Patient/Family verbalized understanding of follow-up arrangements: Yes  Individual responsible for coordination of the follow-up plan: patient  Confirmed correct DME delivered: Lucas Green 04/12/2013    Lucas Green

## 2013-04-26 ENCOUNTER — Ambulatory Visit (HOSPITAL_BASED_OUTPATIENT_CLINIC_OR_DEPARTMENT_OTHER): Payer: Medicare Other | Admitting: Physical Medicine & Rehabilitation

## 2013-04-26 ENCOUNTER — Encounter: Payer: Self-pay | Admitting: Physical Medicine & Rehabilitation

## 2013-04-26 ENCOUNTER — Encounter: Payer: Medicare Other | Attending: Physical Medicine & Rehabilitation

## 2013-04-26 VITALS — BP 133/74 | HR 73 | Ht 74.0 in | Wt 205.4 lb

## 2013-04-26 DIAGNOSIS — E785 Hyperlipidemia, unspecified: Secondary | ICD-10-CM | POA: Insufficient documentation

## 2013-04-26 DIAGNOSIS — R350 Frequency of micturition: Secondary | ICD-10-CM | POA: Insufficient documentation

## 2013-04-26 DIAGNOSIS — I69959 Hemiplegia and hemiparesis following unspecified cerebrovascular disease affecting unspecified side: Secondary | ICD-10-CM | POA: Insufficient documentation

## 2013-04-26 DIAGNOSIS — Z79899 Other long term (current) drug therapy: Secondary | ICD-10-CM | POA: Insufficient documentation

## 2013-04-26 DIAGNOSIS — C61 Malignant neoplasm of prostate: Secondary | ICD-10-CM | POA: Insufficient documentation

## 2013-04-26 DIAGNOSIS — Z9181 History of falling: Secondary | ICD-10-CM | POA: Insufficient documentation

## 2013-04-26 DIAGNOSIS — Z7902 Long term (current) use of antithrombotics/antiplatelets: Secondary | ICD-10-CM | POA: Insufficient documentation

## 2013-04-26 DIAGNOSIS — I10 Essential (primary) hypertension: Secondary | ICD-10-CM | POA: Insufficient documentation

## 2013-04-26 DIAGNOSIS — G8110 Spastic hemiplegia affecting unspecified side: Secondary | ICD-10-CM

## 2013-04-26 NOTE — Patient Instructions (Signed)
[facility] Followup with primary care physician Try increasing oxybutynin to 5 mg at night. Report the result to your urologist Followup with Dr. Anne Hahn from neurology

## 2013-04-26 NOTE — Progress Notes (Signed)
Subjective:    Patient ID: Lucas Green, male    DOB: 1932-05-18, 79 y.o.   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 He was changed to plavix for ischemic infarct due to SVD.   HPI Switched from protonix to dexilant by PCP Fell x 1 at home , elevated toilet seat slipped on day of D/C from hospital Frequent urination at hospital Pain Inventory Average Pain 0 Pain Right Now 0 My pain is no pain  In the last 24 hours, has pain interfered with the following? General activity 0 Relation with others 0 Enjoyment of life 0 What TIME of day is your pain at its worst? no pain Sleep (in general) Good  Pain is worse with: no pain Pain improves with: no pain Relief from Meds: no pain  Mobility use a cane how many minutes can you walk? 10 ability to climb steps?  yes do you drive?  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  no  Physicians involved in your care Any changes since last visit?  no   Family History  Problem Relation Age of Onset  . Heart disease Father    History   Social History  . Marital Status: Married    Spouse Name: N/A    Number of Children: N/A  . Years of Education: N/A   Social History Main Topics  . Smoking status: Never Smoker   . Smokeless tobacco: Never Used  . Alcohol Use: Yes  . Drug Use: No  . Sexually Active: None   Other Topics Concern  . None   Social History Narrative  . None   Past Surgical History  Procedure Laterality Date  . Colon surgery     Past Medical History  Diagnosis Date  . Cancer     prostate  . Hypertension   . GERD  (gastroesophageal reflux disease)    BP 133/74  Pulse 73  Ht 6\' 2"  (1.88 m)  Wt 205 lb 6.4 oz (93.169 kg)  BMI 26.36 kg/m2  SpO2 95%    Review of Systems  Genitourinary:       Bladder control problems  Musculoskeletal: Positive for gait problem.  Neurological: Positive for weakness.  All other systems reviewed and are negative.       Objective:   Physical Exam 3 minus over 5 right deltoid otherwise 5/5 in the right biceps triceps grip left upper extremity 4/5 in the deltoid bicep tricep grip Hip flexors 5/5 bilateral knee extensors 5/5 bilateral right ankle dorsiflexor 5/5 left ankle dorsiflexor 3 minus/5 Ambulation is without evidence of toe drag or knee instability on the left side. No assisted device inside.  Mood and affect are appropriate Extremity without edema Cranial nerves II through XII intact      Assessment & Plan:  1. Right paracentral pontine infarct with residual left hemiparesis. His main deficit is left ankle dorsiflexion. I do expect it to improve over the next 1-2 months. I would recommend continued outpatient PT OT. Recommend PCP followup for hypertension, hyperlipidemia Urology followup for urinary frequency which is likely stroke related however does have a history of prostate carcinoma Neurology followup next  month for secondary stroke prevention continue Plavix RTC when necessary

## 2013-05-09 ENCOUNTER — Telehealth: Payer: Self-pay

## 2013-05-09 NOTE — Telephone Encounter (Signed)
Pharmacy called with questions about patients dexilant prescription.

## 2013-05-10 NOTE — Telephone Encounter (Signed)
Spoke with Lucas Green.  Lucas Green actually says protonix in note so we did not order Dexilant.

## 2013-05-31 ENCOUNTER — Ambulatory Visit (INDEPENDENT_AMBULATORY_CARE_PROVIDER_SITE_OTHER): Payer: Medicare Other | Admitting: Neurology

## 2013-05-31 ENCOUNTER — Encounter: Payer: Self-pay | Admitting: Neurology

## 2013-05-31 VITALS — BP 149/68 | HR 66 | Ht 73.75 in | Wt 208.0 lb

## 2013-05-31 DIAGNOSIS — I6350 Cerebral infarction due to unspecified occlusion or stenosis of unspecified cerebral artery: Secondary | ICD-10-CM

## 2013-05-31 DIAGNOSIS — I6389 Other cerebral infarction: Secondary | ICD-10-CM

## 2013-05-31 NOTE — Progress Notes (Signed)
Reason for visit: Stroke  Lucas Green is a 79 y.o. male  History of present illness:  Lucas Green is a 79 year old white male with a history of a stroke event that occurred around 94 to April 2014. The patient had indicated that he had symptoms approximately 2 days before going in the hospital. The patient had a sensation of fatigue, feeling bad, and then he developed some left-sided weakness. The patient began to have vertigo, and he subsequently was taken to the hospital. A right pontine stroke was noted with an occlusion of the right vertebral artery. The patient was on aspirin prior to this admission. The patient has been in outpatient rehabilitation, and he has done well. The patient is ambulating normally, but he feels that the left leg is slightly weak. The patient has regained function of the left arm. The patient denies any problems with speech or swallowing at this point, but he did have slurred speech initially. The patient has ongoing fatigue which is significant. The patient is now on Plavix. The patient returns for an evaluation. The patient reports problems with urinary incontinence since the stroke.  Past Medical History  Diagnosis Date  . Cancer     prostate  . Hypertension   . GERD (gastroesophageal reflux disease)   . Dyslipidemia   . Degenerative arthritis     Past Surgical History  Procedure Laterality Date  . Tonsillectomy    . Cryosurgery prostate      prostate cancer    Family History  Problem Relation Age of Onset  . Heart disease Father   . Cancer - Colon Mother   . Stroke Maternal Grandfather   . Stroke Paternal Grandfather   . Cancer - Lung Cousin     Social history:  reports that he has never smoked. He has never used smokeless tobacco. He reports that  drinks alcohol. He reports that he does not use illicit drugs.  Medications:  Current Outpatient Prescriptions on File Prior to Visit  Medication Sig Dispense Refill  . clopidogrel  (PLAVIX) 75 MG tablet Take 1 tablet (75 mg total) by mouth daily with breakfast.  30 tablet  1  . metoprolol succinate (TOPROL-XL) 50 MG 24 hr tablet Take 25 mg by mouth daily. Take with or immediately following a meal.      . Multiple Vitamin (MULTIVITAMIN WITH MINERALS) TABS Take 1 tablet by mouth daily.      . simvastatin (ZOCOR) 20 MG tablet Take 1 tablet (20 mg total) by mouth daily at 6 PM.  30 tablet  1   No current facility-administered medications on file prior to visit.    Allergies:  Allergies  Allergen Reactions  . Other     Beer and Wine- swelling/headache/nausea    ROS:  Out of a complete 14 system review of symptoms, the patient complains only of the following symptoms, and all other reviewed systems are negative.  Fatigue Heart murmur Hearing loss, ringing in the ears Double vision Incontinence of bladder, impotence Easy bruising Joint pain  Blood pressure 149/68, pulse 66, height 6' 1.75" (1.873 m), weight 208 lb (94.348 kg).  Physical Exam  General: The patient is alert and cooperative at the time of the examination.  Head: Pupils are equal, round, and reactive to light. Discs are flat bilaterally.  Neck: The neck is supple, heart murmur radiation into both carotid arteries is noted.  Respiratory: The respiratory examination is clear.  Cardiovascular: The cardiovascular examination reveals a regular rate and  rhythm, the patient has a grade IV/VI heart murmur in the aortic area.  Skin: Extremities are without significant edema.  Neurologic Exam  Mental status:  Cranial nerves: Facial symmetry is present. There is good sensation of the face to pinprick and soft touch bilaterally. The strength of the facial muscles and the muscles to head turning and shoulder shrug are normal bilaterally. Speech is well enunciated, no aphasia or dysarthria is noted. Extraocular movements are full. Visual fields are full.  Motor: The motor testing reveals 5 over 5  strength of all 4 extremities. Good symmetric motor tone is noted throughout.  Sensory: Sensory testing is intact to pinprick, soft touch, vibration sensation, and position sense on all 4 extremities, with the exception that there is a mild decrease in pinprick sensation of the left foot. No evidence of extinction is noted.  Coordination: Cerebellar testing reveals good finger-nose-finger and heel-to-shin bilaterally.  Gait and station: Gait is normal. Tandem gait is minimally unsteady. Romberg is negative. No drift is seen.  Reflexes: Deep tendon reflexes are symmetric and normal bilaterally, with the exception that the ankle jerk reflexes are depressed. Toes are downgoing bilaterally.   Assessment/Plan:  One. Right pontine stroke  2. Aortic stenosis  The patient reports significant fatigue issues following the stroke, but he has minimal deficit. The patient has some ongoing urinary incontinence, but this is gradually improving. The patient was given a note to go back to work for 4 hours daily for the next 6 weeks. The patient may then return to full-time employment. The patient is remain on Plavix. The patient will followup through this office as needed.  Marlan Palau MD 06/01/2013 9:02 AM  [facility] [address] [place], [place] [address]  Phone [phone] Fax [phone]

## 2013-06-01 ENCOUNTER — Encounter: Payer: Self-pay | Admitting: Neurology

## 2013-07-17 ENCOUNTER — Other Ambulatory Visit: Payer: Self-pay

## 2013-07-26 ENCOUNTER — Telehealth: Payer: Self-pay

## 2013-07-26 NOTE — Telephone Encounter (Signed)
Patient called in and wants Dr. Wynn Banker to fax a RX to [facility] at [phone] for Protonix 40mg  #90 sig 1 tablet po per day refill x 3

## 2013-07-26 NOTE — Telephone Encounter (Signed)
Needs to get from PCP

## 2013-07-29 NOTE — Telephone Encounter (Signed)
Spoke with patient and informed him to contact his PCP for RX for his Protonix per Dr. Wynn Banker

## 2013-09-24 ENCOUNTER — Encounter: Payer: Self-pay | Admitting: Radiation Oncology

## 2013-09-24 ENCOUNTER — Ambulatory Visit
Admission: RE | Admit: 2013-09-24 | Discharge: 2013-09-24 | Disposition: A | Discharge status: Home / Self Care | Payer: Medicare Other | Source: Ambulatory Visit | Attending: Radiation Oncology | Admitting: Radiation Oncology

## 2013-09-24 VITALS — BP 135/68 | HR 89 | Temp 97.6°F | Resp 20 | Wt 216.2 lb

## 2013-09-24 DIAGNOSIS — C7951 Secondary malignant neoplasm of bone: Secondary | ICD-10-CM | POA: Insufficient documentation

## 2013-09-24 DIAGNOSIS — C61 Malignant neoplasm of prostate: Secondary | ICD-10-CM | POA: Insufficient documentation

## 2013-09-24 DIAGNOSIS — R109 Unspecified abdominal pain: Secondary | ICD-10-CM | POA: Insufficient documentation

## 2013-09-24 DIAGNOSIS — Z510 Encounter for antineoplastic radiation therapy: Secondary | ICD-10-CM | POA: Insufficient documentation

## 2013-09-24 DIAGNOSIS — M25559 Pain in unspecified hip: Secondary | ICD-10-CM | POA: Insufficient documentation

## 2013-09-24 HISTORY — DX: Malignant neoplasm of prostate: C61

## 2013-09-24 HISTORY — DX: Unspecified urinary incontinence: R32

## 2013-09-24 HISTORY — DX: Male erectile dysfunction, unspecified: N52.9

## 2013-09-24 HISTORY — DX: Reserved for inherently not codable concepts without codable children: IMO0001

## 2013-09-24 HISTORY — DX: Secondary malignant neoplasm of unspecified site: C79.9

## 2013-09-24 HISTORY — DX: Cerebral infarction, unspecified: I63.9

## 2013-09-24 HISTORY — DX: Unspecified hearing loss, unspecified ear: H91.90

## 2013-09-24 NOTE — Progress Notes (Signed)
Complex in relation/treatment planning note: The patient was taken to the CT simulator. He was placed supine. A VAC LOC immobilization device was constructed for immobilization. He was then scanned. I chose an isocenter along the right ischium. He is setup to AP and PA fields. 2 separate multileaf collimators were designed to conform the field. I prescribing 3000 cGy in 10 sessions utilizing 15 MV photons.

## 2013-09-24 NOTE — Progress Notes (Signed)
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 male who is seen today for the courtesy Dr. Patsi Sears for consideration of palliative radiation therapy in the management of his metastatic carcinoma prostate to bone. He was diagnosed with high volume Gleason 7 (4+3) adenocarcinoma on 07/21/2009. His PSA was 9.33 at that time. I understand that he underwent cryotherapy and later developed a biochemical recurrence. A bone scan on 08/20/2012 showed several new foci of activity along the right pelvis suspicious for metastatic disease. Degenerative activity was seen along the right shoulder, right hip and upper cervical spine. Specifically, new activity was seen along the right ischium and possibly within the right superior pubic ramus. He was started on Firmagon, but this was discontinued this past April after having a pontine stroke. He is recovering from his CVA but does have residual left-sided weakness and fatigue. Over the past 8 weeks he has had worsening right hip and groin pain which is worse with weightbearing and ambulation. He underwent repeat imaging through [facility] in [facility] there x-rays showed a possible fracture along the superior pubic ramus along with metastatic disease suspected along the superior and inferior pubic rami. His bone scan showed more intensely along the right superior pubic ramus, right hip and also iliac bone. He takes ibuprofen 800 mg twice a day when necessary pain. His PSA from 08/30/2013 rose to 24.87. He had a nadir of 0.51 in January 2014.  PREVIOUS RADIATION THERAPY: No   PAST MEDICAL HISTORY:  has a past medical  history of Cancer; Hypertension; GERD (gastroesophageal reflux disease); Dyslipidemia; Aortic stenosis; Prostate cancer (09/2009); Metastasis from malignant tumor of prostate; Joint pain; Double vision; Murmur; Hearing loss; Incontinence of urine; Hypercholesterolemia; Male impotence; Degenerative arthritis; and Stroke (03/29/13).     PAST SURGICAL HISTORY:  Past Surgical History  Procedure Laterality Date  . Tonsillectomy  1938  . Cryosurgery prostate  09/2009    prostate cancer, Gleason 7     FAMILY HISTORY: family history includes Cancer in his father; Cancer - Colon in his mother; Cancer - Lung in his cousin; Heart attack in his father; Heart disease in his father; Hypertension in his maternal grandfather and paternal grandfather; Stroke in his maternal grandfather and paternal grandfather; Thyroid disease in his mother. His father died from some type of GI malignancy at age 85. His father had a history of prostate cancer found at time of autopsy after dying following a heart attack in his early 21s.   SOCIAL HISTORY:  reports that he has never smoked. He has never used smokeless tobacco. He reports that he drinks alcohol. He reports that he does not use illicit drugs. 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   ALLERGIES: Other   MEDICATIONS:  Current Outpatient Prescriptions  Medication Sig Dispense Refill  . clopidogrel (PLAVIX) 75 MG tablet Take 1 tablet (75 mg total) by mouth daily with breakfast.  30 tablet  1  . ibuprofen (ADVIL,MOTRIN) 800 MG tablet Take 800 mg by mouth every 8 (eight) hours as needed for pain.      Marland Kitchen  metoprolol succinate (TOPROL-XL) 50 MG 24 hr tablet Take 25 mg by mouth daily. Take with or immediately following a meal.      . mirabegron ER (MYRBETRIQ) 50 MG TB24 tablet Take by mouth daily.      . Multiple Vitamin (MULTIVITAMIN WITH MINERALS) TABS Take 1 tablet by mouth daily.      Marland Kitchen oxybutynin (DITROPAN) 5 MG  tablet Take 5 mg by mouth daily.      . pantoprazole (PROTONIX) 40 MG tablet Take 40 mg by mouth daily.      . simvastatin (ZOCOR) 20 MG tablet Take 1 tablet (20 mg total) by mouth daily at 6 PM.  30 tablet  1   No current facility-administered medications for this encounter.     REVIEW OF SYSTEMS:  Pertinent items are noted in HPI.    PHYSICAL EXAM:  weight is 216 lb 3.2 oz (98.068 kg). His oral temperature is 97.6 F (36.4 C). His blood pressure is 135/68 and his pulse is 89. His respiration is 20.   Alert and oriented. Head and neck examination: Grossly unremarkable. Nodes: Without palpable cervical or supraclavicular lymphadenopathy. Chest: Lungs clear. Back: Without spinal or CVA tenderness. Abdomen: Without hepatomegaly. Pelvis: There is palpable discomfort along the right superior pubic ramus. There is excellent range of motion of his right hip. Extremities: Without edema. Does have pain on right shoulder extension limiting his range of motion. Neurologic examination: Left hemiparesis.   LABORATORY DATA:  Lab Results  Component Value Date   WBC 8.8 04/02/2013   HGB 13.7 04/02/2013   HCT 38.7* 04/02/2013   MCV 89.8 04/02/2013   PLT 231 04/02/2013   Lab Results  Component Value Date   NA 137 04/02/2013   K 4.0 04/02/2013   CL 104 04/02/2013   CO2 23 04/02/2013   Lab Results  Component Value Date   ALT 14 04/02/2013   AST 16 04/02/2013   ALKPHOS 115 04/02/2013   BILITOT 0.5 04/02/2013   PSA 24.87 from 08/30/2013.   IMPRESSION: Metastatic carcinoma prostate to bone with symptomatic involvement from disease along his right pubic rami/hip. His pain is worse with weightbearing. Since his Deborra Medina was discontinued this past April because of the possible relationship to his CVA, his PSA has risen, I believe to be disease progression along his right pubic rami with a suspected fracture along the superior pubic ramus. I do recommend a two-week course of palliative radiation therapy. I see  that Dr. Patsi Sears is investigating other androgen deprivation agents in addition to Casodex to control his disease. I do not believe that he has proven himself to be castrate resistant. We talked potential treatment options if and when he is felt to have developed castrate resistant disease including radium 223. We discussed the potential acute and late toxicities of proposed external beam radiation therapy which should be well tolerated. He'll undergo simulation/treatment planning today and he'll begin his treatment no later than early next week. Consent is signed today.   PLAN: As discussed above.  I spent 60 minutes minutes face to face with the patient and more than 50% of that time was spent in counseling and/or coordination of care.

## 2013-09-24 NOTE — Progress Notes (Signed)
Histology and Location of Primary Cancer: prostate  Sites of Visceral and Bony Metastatic Disease: right iliac, ischium  Location(s) of Symptomatic Metastases: right iliac  Past/Anticipated chemotherapy by medical oncology, if any: none  Pain on a scale of 0-10 is:  4-5 in right hip, takes Ibuprofen 800 mg every 12 hours. Pain increased w/standing, walking.   If Spine Met(s), symptoms, if any, include:  Bowel/Bladder retention or incontinence (please describe): bladder incontinence s/p stroke  Numbness or weakness in extremities (please describe): no  Current Decadron regimen, if applicable: no  Ambulatory status? Walker? Wheelchair?: walking w/cane  SAFETY ISSUES:  Prior radiation? no  Pacemaker/ICD? no  Possible current pregnancy? na  Is the patient on methotrexate? no   Current Complaints / other details:  Married, retired Ob-Gyn MD, 1 son,  09/03/13 PSA 24.87, 04/26/13 PSA 2.91

## 2013-09-24 NOTE — Progress Notes (Signed)
Please see the Nurse Progress Note in the MD Initial Consult Encounter for this patient. 

## 2013-09-24 NOTE — Addendum Note (Signed)
Encounter addended by: Glennie Hawk, RN on: 09/24/2013  3:51 PM<BR>     Documentation filed: Charges VN

## 2013-09-27 ENCOUNTER — Ambulatory Visit
Admission: RE | Admit: 2013-09-27 | Discharge: 2013-09-27 | Disposition: A | Discharge status: Home / Self Care | Payer: Medicare Other | Source: Ambulatory Visit | Attending: Radiation Oncology | Admitting: Radiation Oncology

## 2013-09-30 ENCOUNTER — Ambulatory Visit
Admission: RE | Admit: 2013-09-30 | Discharge: 2013-09-30 | Disposition: A | Discharge status: Home / Self Care | Payer: Medicare Other | Source: Ambulatory Visit | Attending: Radiation Oncology | Admitting: Radiation Oncology

## 2013-09-30 VITALS — BP 135/56 | HR 72 | Temp 97.5°F | Wt 218.3 lb

## 2013-09-30 DIAGNOSIS — C7951 Secondary malignant neoplasm of bone: Secondary | ICD-10-CM

## 2013-09-30 NOTE — Progress Notes (Signed)
Patient here for routine weekly assessment of radiation to right hip/ischium.Has pain in right groin.Reviewed clinic routine and side effects of treatment to include fatigue and possible skin discoloration.Given radiation Therapy and You booklet.

## 2013-09-30 NOTE — Progress Notes (Signed)
Weekly Management Note:  Site: Right hip Current Dose:   300  cGy Projected Dose: 3000  cGy  Narrative: The patient is seen today for routine under treatment assessment. CBCT/MVCT images/port films were reviewed. The chart was reviewed.   He still has right groin pain, particularly with weightbearing/ambulation.  Physical Examination:  Filed Vitals:   09/30/13 1627  BP: 135/56  Pulse: 72  Temp: 97.5 F (36.4 C)  .  Weight: 218 lb 4.8 oz (99.02 kg). No change.  Impression: Tolerating radiation therapy well.  Plan: Continue radiation therapy as planned.

## 2013-10-01 ENCOUNTER — Ambulatory Visit
Admission: RE | Admit: 2013-10-01 | Discharge: 2013-10-01 | Disposition: A | Discharge status: Home / Self Care | Payer: Medicare Other | Source: Ambulatory Visit | Attending: Radiation Oncology | Admitting: Radiation Oncology

## 2013-10-02 ENCOUNTER — Ambulatory Visit
Admission: RE | Admit: 2013-10-02 | Discharge: 2013-10-02 | Disposition: A | Discharge status: Home / Self Care | Payer: Medicare Other | Source: Ambulatory Visit | Attending: Radiation Oncology | Admitting: Radiation Oncology

## 2013-10-03 ENCOUNTER — Ambulatory Visit
Admission: RE | Admit: 2013-10-03 | Discharge: 2013-10-03 | Disposition: A | Discharge status: Home / Self Care | Payer: Medicare Other | Source: Ambulatory Visit | Attending: Radiation Oncology | Admitting: Radiation Oncology

## 2013-10-04 ENCOUNTER — Ambulatory Visit
Admission: RE | Admit: 2013-10-04 | Discharge: 2013-10-04 | Disposition: A | Discharge status: Home / Self Care | Payer: Medicare Other | Source: Ambulatory Visit | Attending: Radiation Oncology | Admitting: Radiation Oncology

## 2013-10-07 ENCOUNTER — Encounter: Payer: Self-pay | Admitting: Radiation Oncology

## 2013-10-07 ENCOUNTER — Ambulatory Visit
Admission: RE | Admit: 2013-10-07 | Discharge: 2013-10-07 | Disposition: A | Discharge status: Home / Self Care | Payer: Medicare Other | Source: Ambulatory Visit | Attending: Radiation Oncology | Admitting: Radiation Oncology

## 2013-10-07 VITALS — BP 140/69 | HR 84 | Resp 20 | Wt 218.0 lb

## 2013-10-07 DIAGNOSIS — C7951 Secondary malignant neoplasm of bone: Secondary | ICD-10-CM

## 2013-10-07 NOTE — Progress Notes (Signed)
Pt c/o pain in his right hip 3-4/10. He states pain worst w/weight bearing. He continues to take Ibuprofen 800 mg regularly w/fair to good pain relief. He is fatigued w/some loss of appetite.

## 2013-10-07 NOTE — Progress Notes (Signed)
Weekly Management Note:  Site: Right hip Current Dose:  1800  cGy Projected Dose: 3  cGy  Narrative: The patient is seen today for routine under treatment assessment. CBCT/MVCT images/port films were reviewed. The chart was reviewed.   He states that his right hip/groin pain is slightly improved. His pain is 3-4/10. He does take ibuprofen when necessary.  Physical Examination:  Filed Vitals:   10/07/13 1332  BP: 140/69  Pulse: 84  Resp: 20  .  Weight: 218 lb (98.884 kg). No change.  Impression: Tolerating radiation therapy well. He finishes his radiation therapy this Friday. He'll then return here for a one-month followup visit.  Plan: Continue radiation therapy as planned.

## 2013-10-08 ENCOUNTER — Ambulatory Visit
Admission: RE | Admit: 2013-10-08 | Discharge: 2013-10-08 | Disposition: A | Discharge status: Home / Self Care | Payer: Medicare Other | Source: Ambulatory Visit | Attending: Radiation Oncology | Admitting: Radiation Oncology

## 2013-10-09 ENCOUNTER — Ambulatory Visit
Admission: RE | Admit: 2013-10-09 | Discharge: 2013-10-09 | Disposition: A | Discharge status: Home / Self Care | Payer: Medicare Other | Source: Ambulatory Visit | Attending: Radiation Oncology | Admitting: Radiation Oncology

## 2013-10-10 ENCOUNTER — Ambulatory Visit
Admission: RE | Admit: 2013-10-10 | Discharge: 2013-10-10 | Disposition: A | Discharge status: Home / Self Care | Payer: Medicare Other | Source: Ambulatory Visit | Attending: Radiation Oncology | Admitting: Radiation Oncology

## 2013-10-11 ENCOUNTER — Encounter: Payer: Self-pay | Admitting: Radiation Oncology

## 2013-10-11 ENCOUNTER — Ambulatory Visit
Admission: RE | Admit: 2013-10-11 | Discharge: 2013-10-11 | Disposition: A | Discharge status: Home / Self Care | Payer: Medicare Other | Source: Ambulatory Visit | Attending: Radiation Oncology | Admitting: Radiation Oncology

## 2013-10-11 NOTE — Progress Notes (Signed)
[facility] Radiation Oncology End of Treatment Note  Name:Lucas Green  Date: 10/11/2013 ZOX:[id number] DOB:[birth year]   Status:outpatient    CC: Dina Rich, MD  Dr. Alexis Frock  REFERRING PHYSICIAN:  Dr. 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 Of note is that he developed diarrhea during his second week of radiation therapy, presumably secondary to antibiotic therapy for his respiratory infection. This would not be expected to be the result of his radiation therapy based on his fields.                         PLAN: Routine followup in one month. Patient instructed to call if questions or worsening complaints in interim.

## 2013-10-11 NOTE — Progress Notes (Signed)
Simulation verification note: The patient underwent simulation verification on 09/27/2013 to his right hip/ischium. His isocenter was in good position and the multileaf collimators contoured the treatment volume appropriately.

## 2013-10-17 ENCOUNTER — Other Ambulatory Visit: Payer: Self-pay

## 2013-10-30 ENCOUNTER — Telehealth: Payer: Self-pay | Admitting: Oncology

## 2013-10-30 NOTE — Telephone Encounter (Signed)
S/w pt and gve np appt 11/28 @ 1:30 w/Dr. Clelia Croft Referring Dr. Patsi Sears Dx- Prostate Ca Welcome packet mailed

## 2013-10-30 NOTE — Telephone Encounter (Signed)
C/D 10/30/13 for appt. 11/08/13

## 2013-11-01 ENCOUNTER — Other Ambulatory Visit: Payer: Self-pay | Admitting: Oncology

## 2013-11-01 DIAGNOSIS — C61 Malignant neoplasm of prostate: Secondary | ICD-10-CM

## 2013-11-08 ENCOUNTER — Ambulatory Visit (HOSPITAL_BASED_OUTPATIENT_CLINIC_OR_DEPARTMENT_OTHER): Payer: Medicare Other | Admitting: Oncology

## 2013-11-08 ENCOUNTER — Telehealth: Payer: Self-pay | Admitting: Oncology

## 2013-11-08 ENCOUNTER — Other Ambulatory Visit (HOSPITAL_BASED_OUTPATIENT_CLINIC_OR_DEPARTMENT_OTHER): Payer: Medicare Other | Admitting: Lab

## 2013-11-08 ENCOUNTER — Encounter: Payer: Self-pay | Admitting: Oncology

## 2013-11-08 ENCOUNTER — Ambulatory Visit: Payer: Medicare Other

## 2013-11-08 VITALS — BP 150/63 | HR 86 | Temp 97.3°F | Resp 18 | Ht 73.75 in | Wt 207.0 lb

## 2013-11-08 DIAGNOSIS — C7951 Secondary malignant neoplasm of bone: Secondary | ICD-10-CM

## 2013-11-08 DIAGNOSIS — C61 Malignant neoplasm of prostate: Secondary | ICD-10-CM

## 2013-11-08 LAB — CBC WITH DIFFERENTIAL/PLATELET
BASO%: 1.1 % (ref 0.0–2.0)
Basophils Absolute: 0.1 10*3/uL (ref 0.0–0.1)
EOS%: 4.1 % (ref 0.0–7.0)
Eosinophils Absolute: 0.3 10*3/uL (ref 0.0–0.5)
HCT: 39.6 % (ref 38.4–49.9)
HGB: 13.1 g/dL (ref 13.0–17.1)
LYMPH%: 14.1 % (ref 14.0–49.0)
MCH: 31.5 pg (ref 27.2–33.4)
MCHC: 33.1 g/dL (ref 32.0–36.0)
MCV: 95.2 fL (ref 79.3–98.0)
MONO#: 0.8 10*3/uL (ref 0.1–0.9)
MONO%: 10.8 % (ref 0.0–14.0)
NEUT#: 5.3 10*3/uL (ref 1.5–6.5)
NEUT%: 69.9 % (ref 39.0–75.0)
Platelets: 237 10*3/uL (ref 140–400)
RBC: 4.16 10*6/uL — ABNORMAL LOW (ref 4.20–5.82)
RDW: 13.9 % (ref 11.0–14.6)
WBC: 7.6 10*3/uL (ref 4.0–10.3)
lymph#: 1.1 10*3/uL (ref 0.9–3.3)

## 2013-11-08 LAB — COMPREHENSIVE METABOLIC PANEL (CC13)
ALT: 10 U/L (ref 0–55)
AST: 17 U/L (ref 5–34)
Albumin: 3.9 g/dL (ref 3.5–5.0)
Alkaline Phosphatase: 289 U/L — ABNORMAL HIGH (ref 40–150)
Anion Gap: 14 mEq/L — ABNORMAL HIGH (ref 3–11)
BUN: 20.7 mg/dL (ref 7.0–26.0)
CO2: 17 mEq/L — ABNORMAL LOW (ref 22–29)
Calcium: 9.7 mg/dL (ref 8.4–10.4)
Chloride: 112 mEq/L — ABNORMAL HIGH (ref 98–109)
Creatinine: 1.6 mg/dL — ABNORMAL HIGH (ref 0.7–1.3)
Glucose: 142 mg/dl — ABNORMAL HIGH (ref 70–140)
Potassium: 3.7 mEq/L (ref 3.5–5.1)
Sodium: 144 mEq/L (ref 136–145)
Total Bilirubin: 1.01 mg/dL (ref 0.20–1.20)
Total Protein: 7 g/dL (ref 6.4–8.3)

## 2013-11-08 LAB — TESTOSTERONE: Testosterone: 254 ng/dL — ABNORMAL LOW (ref 300–890)

## 2013-11-08 LAB — PSA: PSA: 14.08 ng/mL — ABNORMAL HIGH (ref <=4.00)

## 2013-11-08 NOTE — Progress Notes (Signed)
Please see consult note.  

## 2013-11-08 NOTE — Telephone Encounter (Signed)
Gave pt appt for lab and MD on January 2015 °

## 2013-11-08 NOTE — Progress Notes (Signed)
Checked in new patient with no financial issues. He has not been to [place] and has his appt card.

## 2013-11-08 NOTE — Consult Note (Signed)
Reason for Referral: Prostate cancer.   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 He was diagnosed with high volume Gleason 7 (4+3) adenocarcinoma on 07/21/2009 on the other care of Dr. Patsi Sears. His PSA was 9.33 at that time. He is S/P cryotherapy and later developed a biochemical recurrence. A bone scan on 08/20/2012 showed several new foci of activity along the right pelvis suspicious for metastatic disease. Degenerative activity was seen along the right shoulder, right hip and upper cervical spine. Specifically, new activity was seen along the right ischium and possibly within the right superior pubic ramus. He was started on Firmagon, but this was discontinued this past April after having a pontine stroke. He is recovering from his CVA but does have residual left-sided weakness and fatigue. Over the past 8 weeks he has had worsening right hip and groin pain which is worse with weightbearing and ambulation. He underwent repeat imaging through [facility] in [facility] there x-rays showed a possible fracture along the superior pubic ramus along with metastatic disease suspected along the superior and inferior pubic rami. His bone scan showed more intensely along the right superior pubic ramus, right hip and also iliac bone. He takes ibuprofen 800 mg twice a day when necessary pain. His PSA from 08/30/2013 rose to 24.87. He had a nadir of 0.51 in January 2014. He finished radiation therapy under the care of Dr. Dayton Scrape to his hip. He also was started on Casodex but that was stopped after one month due to poor tolerance.  His last testerone was almost back to normal of 283 and PSA as mentioned to 24.87.   Clinically, he reports better hip pain after he has concluded radiation but overall still rather fatigued. His energy is down but slightly improving. He does not report any headaches, blurred vision,  alteration of mental status, psychiatric issues with depression. He does not report any fevers chills sweats. Does not report any chest pain orthopnea PND or palpitations. Does not report any shortness of breath cough or hemoptysis. Does not report any hematemesis nausea vomiting constipation or diarrhea. Does not report any frequency urgency or hesitancy. Does not report any musculoskeletal complaints. Cipro any arthralgias myalgias no heat or cold intolerance rest or view of system is unremarkable.   Past Medical History  Diagnosis Date  . Cancer     prostate  . Hypertension   . GERD (gastroesophageal reflux disease)   . Dyslipidemia   . Aortic stenosis   . Prostate cancer 09/2009  . 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 Joint pain   . Double vision   . Murmur   . Hearing loss   . Incontinence of urine   . Hypercholesterolemia   . 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 Degenerative arthritis     osteoarthritis  . Stroke 03/29/13  :  Past Surgical History  Procedure Laterality Date  . Tonsillectomy  1938  . Cryosurgery prostate  09/2009    prostate cancer, Gleason 7  :  Current Outpatient Prescriptions  Medication Sig Dispense Refill  . clopidogrel (PLAVIX) 75 MG tablet Take 1 tablet (75 mg total) by mouth daily with breakfast.  30 tablet  1  . ibuprofen (ADVIL,MOTRIN) 800 MG tablet Take 800 mg by mouth every 8 (eight) hours as needed for pain.      . metoprolol succinate (TOPROL-XL) 50 MG 24 hr tablet Take 25  mg by mouth daily. Take with or immediately following a meal.      . Multiple Vitamin (MULTIVITAMIN WITH MINERALS) TABS Take 1 tablet by mouth daily.      . pantoprazole (PROTONIX) 40 MG tablet Take 40 mg by mouth daily.      . prochlorperazine (COMPAZINE) 10 MG tablet Take 10 mg by mouth as needed. For nausea/vomiting      . simvastatin (ZOCOR) 20 MG tablet Take 1 tablet (20 mg total) by mouth daily at 6 PM.  30 tablet  1   No current facility-administered  medications for this visit.      Allergies  Allergen Reactions  . Other     Beer and Wine- swelling/headache/nausea  :  Family History  Problem Relation Age of Onset  . Heart disease Father   . Cancer Father     prostate  . Heart attack Father   . Cancer - Colon Mother   . Thyroid disease Mother   . Stroke Maternal Grandfather   . Hypertension Maternal Grandfather   . Stroke Paternal Grandfather   . Hypertension Paternal Grandfather   . Cancer - Lung Cousin   :  History   Social History  . Marital Status: Married    Spouse Name: N/A    Number of Children: 1  . Years of Education: MD   Occupational History  . Not on file.   Social History Main Topics  . Smoking status: Never Smoker   . Smokeless tobacco: Never Used  . Alcohol Use: Yes     Comment: martini 2 a day  . Drug Use: No  . Sexual Activity: Not on file   Other Topics Concern  . Not on file   Social History Narrative  . No narrative on file  :  Pertinent items are noted in HPI.  Exam: Blood pressure 150/63, pulse 86, temperature 97.3 F (36.3 C), temperature source Oral, resp. rate 18, height 6' 1.75" (1.873 m), weight 207 lb (93.895 kg). General appearance: alert, cooperative and appears stated age Head: Normocephalic, without obvious abnormality, atraumatic Nose: Nares normal. Septum midline. Mucosa normal. No drainage or sinus tenderness. Throat: lips, mucosa, and tongue normal; teeth and gums normal Neck: no adenopathy, no carotid bruit, no JVD, supple, symmetrical, trachea midline and thyroid not enlarged, symmetric, no tenderness/mass/nodules Back: symmetric, no curvature. ROM normal. No CVA tenderness. Resp: clear to auscultation bilaterally Chest wall: no tenderness Cardio: regular rate and rhythm, S1, S2 normal, no murmur, click, rub or gallop GI: soft, non-tender; bowel sounds normal; no masses,  no organomegaly Extremities: extremities normal, atraumatic, no cyanosis or  edema Pulses: 2+ and symmetric Skin: Skin color, texture, turgor normal. No rashes or lesions Lymph nodes: Cervical, supraclavicular, and axillary nodes normal. Neurologic: Grossly normal   Recent Labs  11/08/13 1333  WBC 7.6  HGB 13.1  HCT 39.6  PLT 237    Recent Labs  11/08/13 1334  NA 144  K 3.7  CO2 17*  GLUCOSE 142*  BUN 20.7  CREATININE 1.6*  CALCIUM 9.7    NUCLEAR MEDICINE WHOLE BODY BONE SCINTIGRAPHY  Technique: Whole body anterior and posterior images were obtained  approximately 3 hours after intravenous injection of  radiopharmaceutical.  Radiopharmaceutical: CURIE TC-MDP TECHNETIUM TC 66M  MEDRONATE IV KIT  Comparison: Whole body bone scan 08/10/2009. Lumbar and cervical  spine radiographs and right shoulder radiographs 08/10/2009.  Abdominal pelvic CTs today and 08/10/2009.  Findings: There is stable severe degenerative activity in the  right  shoulder and posteriorly in the upper cervical spine. Discogenic  activity within the mid lumbar spine appears stable. Right hip  arthropathic activity appears stable.  There are several new foci of increased activity superiorly within  the right iliac bone. There is also increased activity within the  right ischium and possibly within the right superior pubic ramus.  There is a small focus of activity posteriorly in the left ischium.  Today's CT demonstrates no definite correlative findings.  Degenerative activity in both knees appears stable. The soft  tissue activity appears normal.  IMPRESSION:  1. Several new foci of pelvic activity suspicious for osseous  metastatic disease but without correlative finding on today's CT.  2. Stable degenerative activity in the right shoulder, right hip,  upper cervical spine and mid lumbar spine.  Assessment and Plan:   79 year old gentleman with the following issues:  1. Advanced prostate cancer with metastatic disease to the bone. His initial diagnosis back in  2010 with a Gleason score of 4+3 equals 7 with PSA around 9. He had a nadir of PSA close to 0 with androgen depravation but has not been on androgen deprivation since April of 2014 after a CVA. His most recent PSA is up to 24 with a rapid doubling time however his testosterone level is not castrate. Treatment options were discussed today as well as the natural course of advanced prostate cancer. I feel the next order of business would be to decrease his testosterone levels to a castrate levels of 50 or less. That could be accomplished by reintroducing androgen deprivation with short-acting or long acting agent or by surgical castration. Once that is done, and we see that his testosterone level is less than 50 and his PSA will be assessed at that time. If his PSA continue to rise despite castrate levels testosterone, then we can consider hormonal agents such as Gwynneth Albright, or systemic chemotherapy. Though his testosterone levels are castrate, none of these agents are indicated nor are they approved. But he understands he develops castration resistant disease which he has not there is a lot of options that we can help him with.  The plan at this point for him to meet with Dr. Patsi Sears next week and proceed with androgen depravation and I will assess him in about 2 months and remeasure his PSA to determine whether he has developed castration resistant or not..  2. Hip pain: He is status post radiation therapy which have helped him at this point.  3. Bone disease: He will probably benefit from bone directed therapy such as Xgeva or Zometa.

## 2013-11-14 ENCOUNTER — Encounter: Payer: Self-pay | Admitting: *Deleted

## 2013-11-19 ENCOUNTER — Encounter: Payer: Self-pay | Admitting: Radiation Oncology

## 2013-11-19 ENCOUNTER — Ambulatory Visit
Admission: RE | Admit: 2013-11-19 | Discharge: 2013-11-19 | Disposition: A | Discharge status: Home / Self Care | Payer: Medicare Other | Source: Ambulatory Visit | Attending: Radiation Oncology | Admitting: Radiation Oncology

## 2013-11-19 VITALS — BP 109/70 | HR 76 | Resp 22 | Wt 210.0 lb

## 2013-11-19 DIAGNOSIS — C7951 Secondary malignant neoplasm of bone: Secondary | ICD-10-CM

## 2013-11-19 NOTE — Progress Notes (Signed)
Pt reports ongoing fatigue, loss of appetite, SOB which he had at completion of radiation treatments. He states he has SOB is w/minimal exertion. O2 sat 97% today. Pt states his right hip pain resolved a week ago.

## 2013-11-19 NOTE — Progress Notes (Signed)
CC: Dr. Jethro Bolus, Dr. 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 He is no longer having any hip pain. His major complaint is that of fatigue and dyspnea on exertion. He recently saw Dr. Patsi Sears and was told that he is bloodwork including his hemoglobin were satisfactory. I do not have these laboratory values at the time this dictation. He was given a Vantas implant for androgen deprivation therapy. He sees Dr. Clelia Croft for a followup visit in January.  Physical examination: Alert and oriented. Filed Vitals:   11/19/13 1527  BP: 109/70  Pulse: 76  Resp: 22   He's not examined today.  Impression: Satisfactory palliation of his right hip/ischium. His fatigue is probably multifactorial, related to his previous CVA and ongoing androgen deprivation therapy. He should not be having radiation related fatigue at this point.  Plan: Followup through Dr. Patsi Sears, and Dr. Clelia Croft.

## 2013-12-10 ENCOUNTER — Other Ambulatory Visit (HOSPITAL_COMMUNITY): Payer: Self-pay | Admitting: Urology

## 2013-12-10 ENCOUNTER — Ambulatory Visit (HOSPITAL_COMMUNITY)
Admission: RE | Admit: 2013-12-10 | Discharge: 2013-12-10 | Disposition: A | Discharge status: Home / Self Care | Payer: Medicare Other | Source: Ambulatory Visit | Attending: Urology | Admitting: Urology

## 2013-12-10 DIAGNOSIS — R0602 Shortness of breath: Secondary | ICD-10-CM

## 2013-12-10 DIAGNOSIS — J984 Other disorders of lung: Secondary | ICD-10-CM | POA: Insufficient documentation

## 2013-12-10 DIAGNOSIS — M19019 Primary osteoarthritis, unspecified shoulder: Secondary | ICD-10-CM | POA: Insufficient documentation

## 2013-12-10 DIAGNOSIS — R05 Cough: Secondary | ICD-10-CM | POA: Insufficient documentation

## 2013-12-10 DIAGNOSIS — J90 Pleural effusion, not elsewhere classified: Secondary | ICD-10-CM | POA: Insufficient documentation

## 2013-12-10 DIAGNOSIS — C7951 Secondary malignant neoplasm of bone: Secondary | ICD-10-CM | POA: Insufficient documentation

## 2013-12-10 DIAGNOSIS — M948X9 Other specified disorders of cartilage, unspecified sites: Secondary | ICD-10-CM | POA: Insufficient documentation

## 2013-12-10 DIAGNOSIS — C61 Malignant neoplasm of prostate: Secondary | ICD-10-CM | POA: Insufficient documentation

## 2013-12-10 DIAGNOSIS — R059 Cough, unspecified: Secondary | ICD-10-CM | POA: Insufficient documentation

## 2013-12-18 ENCOUNTER — Encounter: Payer: Self-pay | Admitting: Gastroenterology

## 2013-12-30 ENCOUNTER — Inpatient Hospital Stay (HOSPITAL_COMMUNITY)
Admission: EM | Admit: 2013-12-30 | Discharge: 2014-01-20 | DRG: 266 | Disposition: A | Discharge status: Skilled Nursing Facility | Payer: Medicare Other | Attending: Thoracic Surgery (Cardiothoracic Vascular Surgery) | Admitting: Thoracic Surgery (Cardiothoracic Vascular Surgery)

## 2013-12-30 ENCOUNTER — Encounter (HOSPITAL_COMMUNITY): Payer: Self-pay | Admitting: Emergency Medicine

## 2013-12-30 ENCOUNTER — Emergency Department (HOSPITAL_COMMUNITY): Payer: Medicare Other

## 2013-12-30 DIAGNOSIS — I6350 Cerebral infarction due to unspecified occlusion or stenosis of unspecified cerebral artery: Secondary | ICD-10-CM

## 2013-12-30 DIAGNOSIS — C799 Secondary malignant neoplasm of unspecified site: Secondary | ICD-10-CM

## 2013-12-30 DIAGNOSIS — Z952 Presence of prosthetic heart valve: Secondary | ICD-10-CM

## 2013-12-30 DIAGNOSIS — E785 Hyperlipidemia, unspecified: Secondary | ICD-10-CM | POA: Diagnosis present

## 2013-12-30 DIAGNOSIS — R5381 Other malaise: Secondary | ICD-10-CM | POA: Diagnosis not present

## 2013-12-30 DIAGNOSIS — R32 Unspecified urinary incontinence: Secondary | ICD-10-CM | POA: Diagnosis present

## 2013-12-30 DIAGNOSIS — R112 Nausea with vomiting, unspecified: Secondary | ICD-10-CM | POA: Diagnosis not present

## 2013-12-30 DIAGNOSIS — I080 Rheumatic disorders of both mitral and aortic valves: Principal | ICD-10-CM | POA: Diagnosis present

## 2013-12-30 DIAGNOSIS — R627 Adult failure to thrive: Secondary | ICD-10-CM | POA: Diagnosis present

## 2013-12-30 DIAGNOSIS — D62 Acute posthemorrhagic anemia: Secondary | ICD-10-CM | POA: Diagnosis not present

## 2013-12-30 DIAGNOSIS — I5033 Acute on chronic diastolic (congestive) heart failure: Secondary | ICD-10-CM

## 2013-12-30 DIAGNOSIS — IMO0002 Reserved for concepts with insufficient information to code with codable children: Secondary | ICD-10-CM

## 2013-12-30 DIAGNOSIS — E860 Dehydration: Secondary | ICD-10-CM | POA: Diagnosis present

## 2013-12-30 DIAGNOSIS — C61 Malignant neoplasm of prostate: Secondary | ICD-10-CM | POA: Diagnosis present

## 2013-12-30 DIAGNOSIS — Z91018 Allergy to other foods: Secondary | ICD-10-CM

## 2013-12-30 DIAGNOSIS — I428 Other cardiomyopathies: Secondary | ICD-10-CM | POA: Diagnosis present

## 2013-12-30 DIAGNOSIS — Z66 Do not resuscitate: Secondary | ICD-10-CM | POA: Diagnosis present

## 2013-12-30 DIAGNOSIS — Z7902 Long term (current) use of antithrombotics/antiplatelets: Secondary | ICD-10-CM

## 2013-12-30 DIAGNOSIS — C7951 Secondary malignant neoplasm of bone: Secondary | ICD-10-CM | POA: Diagnosis present

## 2013-12-30 DIAGNOSIS — I5032 Chronic diastolic (congestive) heart failure: Secondary | ICD-10-CM

## 2013-12-30 DIAGNOSIS — R531 Weakness: Secondary | ICD-10-CM | POA: Diagnosis present

## 2013-12-30 DIAGNOSIS — I359 Nonrheumatic aortic valve disorder, unspecified: Secondary | ICD-10-CM

## 2013-12-30 DIAGNOSIS — I10 Essential (primary) hypertension: Secondary | ICD-10-CM | POA: Diagnosis present

## 2013-12-30 DIAGNOSIS — Z006 Encounter for examination for normal comparison and control in clinical research program: Secondary | ICD-10-CM

## 2013-12-30 DIAGNOSIS — I5043 Acute on chronic combined systolic (congestive) and diastolic (congestive) heart failure: Secondary | ICD-10-CM

## 2013-12-30 DIAGNOSIS — I480 Paroxysmal atrial fibrillation: Secondary | ICD-10-CM | POA: Diagnosis present

## 2013-12-30 DIAGNOSIS — I6389 Other cerebral infarction: Secondary | ICD-10-CM

## 2013-12-30 DIAGNOSIS — Z8546 Personal history of malignant neoplasm of prostate: Secondary | ICD-10-CM

## 2013-12-30 DIAGNOSIS — K219 Gastro-esophageal reflux disease without esophagitis: Secondary | ICD-10-CM | POA: Diagnosis present

## 2013-12-30 DIAGNOSIS — E440 Moderate protein-calorie malnutrition: Secondary | ICD-10-CM | POA: Diagnosis present

## 2013-12-30 DIAGNOSIS — H9190 Unspecified hearing loss, unspecified ear: Secondary | ICD-10-CM | POA: Diagnosis present

## 2013-12-30 DIAGNOSIS — I4891 Unspecified atrial fibrillation: Secondary | ICD-10-CM

## 2013-12-30 DIAGNOSIS — J9600 Acute respiratory failure, unspecified whether with hypoxia or hypercapnia: Secondary | ICD-10-CM | POA: Diagnosis present

## 2013-12-30 DIAGNOSIS — Z79899 Other long term (current) drug therapy: Secondary | ICD-10-CM

## 2013-12-30 DIAGNOSIS — I350 Nonrheumatic aortic (valve) stenosis: Secondary | ICD-10-CM | POA: Diagnosis present

## 2013-12-30 DIAGNOSIS — I2510 Atherosclerotic heart disease of native coronary artery without angina pectoris: Secondary | ICD-10-CM | POA: Diagnosis present

## 2013-12-30 DIAGNOSIS — Z8673 Personal history of transient ischemic attack (TIA), and cerebral infarction without residual deficits: Secondary | ICD-10-CM

## 2013-12-30 DIAGNOSIS — M1990 Unspecified osteoarthritis, unspecified site: Secondary | ICD-10-CM | POA: Diagnosis present

## 2013-12-30 DIAGNOSIS — Z923 Personal history of irradiation: Secondary | ICD-10-CM

## 2013-12-30 DIAGNOSIS — E876 Hypokalemia: Secondary | ICD-10-CM | POA: Diagnosis present

## 2013-12-30 DIAGNOSIS — J90 Pleural effusion, not elsewhere classified: Secondary | ICD-10-CM | POA: Diagnosis present

## 2013-12-30 DIAGNOSIS — C7952 Secondary malignant neoplasm of bone marrow: Secondary | ICD-10-CM

## 2013-12-30 DIAGNOSIS — I2789 Other specified pulmonary heart diseases: Secondary | ICD-10-CM | POA: Diagnosis present

## 2013-12-30 DIAGNOSIS — J189 Pneumonia, unspecified organism: Secondary | ICD-10-CM | POA: Insufficient documentation

## 2013-12-30 DIAGNOSIS — I509 Heart failure, unspecified: Secondary | ICD-10-CM | POA: Diagnosis present

## 2013-12-30 DIAGNOSIS — Z8249 Family history of ischemic heart disease and other diseases of the circulatory system: Secondary | ICD-10-CM

## 2013-12-30 HISTORY — DX: Reserved for concepts with insufficient information to code with codable children: IMO0002

## 2013-12-30 HISTORY — DX: Chronic diastolic (congestive) heart failure: I50.32

## 2013-12-30 HISTORY — DX: Presence of prosthetic heart valve: Z95.2

## 2013-12-30 HISTORY — DX: Acute on chronic diastolic (congestive) heart failure: I50.33

## 2013-12-30 HISTORY — DX: Moderate protein-calorie malnutrition: E44.0

## 2013-12-30 HISTORY — DX: Nonrheumatic aortic (valve) stenosis: I35.0

## 2013-12-30 HISTORY — DX: Pleural effusion, not elsewhere classified: J90

## 2013-12-30 HISTORY — DX: Unspecified atrial fibrillation: I48.91

## 2013-12-30 LAB — BASIC METABOLIC PANEL
BUN: 21 mg/dL (ref 6–23)
CO2: 21 mEq/L (ref 19–32)
CREATININE: 1.17 mg/dL (ref 0.50–1.35)
Calcium: 9.2 mg/dL (ref 8.4–10.5)
Chloride: 102 mEq/L (ref 96–112)
GFR calc non Af Amer: 57 mL/min — ABNORMAL LOW (ref >90)
GFR, EST AFRICAN AMERICAN: 67 mL/min — AB (ref >90)
Glucose, Bld: 103 mg/dL — ABNORMAL HIGH (ref 70–99)
Potassium: 3.5 mEq/L — ABNORMAL LOW (ref 3.7–5.3)
Sodium: 140 mEq/L (ref 137–147)

## 2013-12-30 LAB — CBC
HEMATOCRIT: 38.5 % — AB (ref 39.0–52.0)
HEMOGLOBIN: 13.2 g/dL (ref 13.0–17.0)
MCH: 31.7 pg (ref 26.0–34.0)
MCHC: 34.3 g/dL (ref 30.0–36.0)
MCV: 92.3 fL (ref 78.0–100.0)
Platelets: 325 10*3/uL (ref 150–400)
RBC: 4.17 MIL/uL — ABNORMAL LOW (ref 4.22–5.81)
RDW: 13.8 % (ref 11.5–15.5)
WBC: 7.8 10*3/uL (ref 4.0–10.5)

## 2013-12-30 LAB — CG4 I-STAT (LACTIC ACID): Lactic Acid, Venous: 1.66 mmol/L (ref 0.5–2.2)

## 2013-12-30 LAB — PRO B NATRIURETIC PEPTIDE: PRO B NATRI PEPTIDE: 15574 pg/mL — AB (ref 0–450)

## 2013-12-30 MED ORDER — OXYCODONE-ACETAMINOPHEN 5-325 MG PO TABS: 1.0000 | ORAL_TABLET | ORAL | Status: DC | PRN

## 2013-12-30 MED ORDER — SODIUM CHLORIDE 0.9 % IJ SOLN
3.0000 mL | Freq: Two times a day (BID) | INTRAMUSCULAR | Status: DC
  Administered 2013-12-30 – 2014-01-03 (×4): 3 mL via INTRAVENOUS

## 2013-12-30 MED ORDER — PANTOPRAZOLE SODIUM 40 MG PO TBEC
40.0000 mg | DELAYED_RELEASE_TABLET | Freq: Every day | ORAL | Status: DC
  Administered 2013-12-31 – 2014-01-20 (×20): 40 mg via ORAL
  Filled 2013-12-30 (×20): qty 1

## 2013-12-30 MED ORDER — METOPROLOL SUCCINATE ER 25 MG PO TB24
25.0000 mg | ORAL_TABLET | Freq: Every day | ORAL | Status: DC
  Administered 2013-12-31: 25 mg via ORAL
  Filled 2013-12-30 (×3): qty 1

## 2013-12-30 MED ORDER — AZITHROMYCIN 500 MG PO TABS
500.0000 mg | ORAL_TABLET | ORAL | Status: DC
  Administered 2013-12-31 – 2014-01-02 (×3): 500 mg via ORAL
  Filled 2013-12-30 (×4): qty 1

## 2013-12-30 MED ORDER — IBUPROFEN 800 MG PO TABS: 800.0000 mg | ORAL_TABLET | ORAL | Status: DC | PRN

## 2013-12-30 MED ORDER — ENOXAPARIN SODIUM 40 MG/0.4ML ~~LOC~~ SOLN
40.0000 mg | SUBCUTANEOUS | Status: DC
  Administered 2013-12-30 – 2013-12-31 (×2): 40 mg via SUBCUTANEOUS
  Filled 2013-12-30 (×3): qty 0.4

## 2013-12-30 MED ORDER — DEXTROSE 5 % IV SOLN
500.0000 mg | Freq: Once | INTRAVENOUS | Status: DC
  Administered 2013-12-30: 500 mg via INTRAVENOUS

## 2013-12-30 MED ORDER — CLOPIDOGREL BISULFATE 75 MG PO TABS
75.0000 mg | ORAL_TABLET | Freq: Every day | ORAL | Status: DC
  Administered 2013-12-31: 75 mg via ORAL
  Filled 2013-12-30 (×3): qty 1

## 2013-12-30 MED ORDER — DEXTROSE 5 % IV SOLN
1.0000 g | Freq: Once | INTRAVENOUS | Status: AC
  Administered 2013-12-30: 1 g via INTRAVENOUS
  Filled 2013-12-30: qty 10

## 2013-12-30 MED ORDER — SODIUM CHLORIDE 0.9 % IJ SOLN: 3.0000 mL | INTRAMUSCULAR | Status: DC | PRN

## 2013-12-30 MED ORDER — SIMVASTATIN 20 MG PO TABS
20.0000 mg | ORAL_TABLET | Freq: Every day | ORAL | Status: DC
  Administered 2013-12-31 – 2014-01-13 (×14): 20 mg via ORAL
  Filled 2013-12-30 (×16): qty 1

## 2013-12-30 MED ORDER — POTASSIUM CHLORIDE CRYS ER 20 MEQ PO TBCR
40.0000 meq | EXTENDED_RELEASE_TABLET | Freq: Once | ORAL | Status: AC
  Administered 2013-12-30: 40 meq via ORAL
  Filled 2013-12-30: qty 2

## 2013-12-30 MED ORDER — ALBUTEROL SULFATE (2.5 MG/3ML) 0.083% IN NEBU: 2.5000 mg | INHALATION_SOLUTION | RESPIRATORY_TRACT | Status: DC | PRN

## 2013-12-30 MED ORDER — ADULT MULTIVITAMIN W/MINERALS CH
1.0000 | ORAL_TABLET | Freq: Every day | ORAL | Status: DC
  Administered 2013-12-31 – 2014-01-13 (×13): 1 via ORAL
  Filled 2013-12-30 (×16): qty 1

## 2013-12-30 MED ORDER — SODIUM CHLORIDE 0.9 % IV SOLN: 250.0000 mL | INTRAVENOUS | Status: DC | PRN

## 2013-12-30 MED ORDER — CEFTRIAXONE SODIUM 1 G IJ SOLR
1.0000 g | INTRAMUSCULAR | Status: DC
  Administered 2013-12-31 – 2014-01-02 (×3): 1 g via INTRAVENOUS
  Filled 2013-12-30 (×4): qty 10

## 2013-12-30 MED ORDER — SODIUM CHLORIDE 0.9 % IV BOLUS (SEPSIS)
500.0000 mL | Freq: Once | INTRAVENOUS | Status: AC
  Administered 2013-12-30: 500 mL via INTRAVENOUS

## 2013-12-30 NOTE — H&P (Signed)
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 Alen Blew of oncology and also Dr. Hartley Barefoot, hypertension, dyslipidemia who presented to [facility] ED 12/30/2013 with complaints of progressive weakness, fatigue, failure to thrive over past couple of days prior to this admission.Pt reported ongoing nausea with associated vomiting. No significant abdominal discomfort reported. No chest pain, no palpitations but he does endorse dyspnea on exertion and shortness of breath in general. No reports of blood in stool or urine. No diarrhea or constipation. In ED, blood pressure was 134/65, HR 72, T 97.6 F and oxygen saturation 95%. Blood work revealed hypokalemia of 3.5 which was repleted in ED. CXR revealed moderate sized left pleural effusion. Pt was started on azithromycin and rocephin for possible pneumonia.  Assessment and Plan:  Principal Problem:   Weakness generalized, failure to thrive - likely related to metastatic prostate cancer, bone mets and maybe even new lung mets as moderate pleural effusion seen on CXR - needs PT/OT eval - needs nutrition consult - supportive care with IV fluids, encourage PO intake Active Problems:   Pleural effusion, left - likely malignant - order placed for thoracentesis, diagnostic and therapeutic - oxygen support via nasal canula to keep O2 saturation above 90%   CAP (community acquired pneumonia) - order placed for azithromycin and rocephin - follow up blood cultures, resp culture   Hypertension - continue metoprolol   Prostate cancer with bone metastases - under GU and Dr. Hazeline Junker care - please inform Dr. Alen Blew of pt  admission in am   Hypokalemia - repleted in ED   Nausea and vomiting, dehydration - likely due to prostate cancer - supportive care with IV fluids and antiemetics   Dyslipidemia - continue statin therapy   Moderate protein-calorie malnutrition - nutrition consulted  Code Status: Full Family Communication: Pt at bedside Disposition Plan: PT evaluation    Review of Systems:  Constitutional: Negative for fever, chills and positive for malaise/fatigue. Negative for diaphoresis.  HENT: Negative for hearing loss, ear pain, nosebleeds, congestion, sore throat, neck pain, tinnitus and ear discharge.   Eyes: Negative for blurred vision, double vision, photophobia, pain, discharge and redness.  Respiratory: Negative for cough, hemoptysis, sputum production, shortness of breath, wheezing and stridor.   Cardiovascular: Negative for chest pain, palpitations, orthopnea, claudication and leg swelling.  Gastrointestinal: positive for nausea, vomiting and abdominal pain. Negative for heartburn, constipation, blood in stool and melena.  Genitourinary: Negative for dysuria, urgency, frequency, hematuria and flank pain.  Musculoskeletal: Negative for myalgias, back pain, joint pain and falls.  Skin: Negative for itching and rash.  Neurological: Negative for dizziness and positive for weakness. Negative for tingling, tremors, sensory change, speech change, focal weakness, loss of consciousness and headaches.  Endo/Heme/Allergies: Negative for environmental allergies and polydipsia. Does not bruise/bleed easily.  Psychiatric/Behavioral: Negative for suicidal ideas. The patient is not nervous/anxious.      Past Medical History  Diagnosis Date  . Hypertension   . GERD (gastroesophageal reflux disease)   . Dyslipidemia   . Aortic stenosis   . Prostate cancer 09/2009  . 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 Joint pain   . Double vision   . Murmur   .  Hearing loss   .  Incontinence of urine   . Hypercholesterolemia   . 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 Degenerative arthritis     osteoarthritis  . Stroke 03/29/13  . Hx of radiation therapy 09/30/13- 10/11/13    right hip/ischium, 3000 cGy 10 sessions  . Esophageal stricture   . Hiatal hernia   . Diverticulosis     Past Surgical History  Procedure Laterality Date  . Tonsillectomy  1938  . Cryosurgery prostate  09/2009    prostate cancer, Gleason 7    Social History:  reports that he has never smoked. He has never used smokeless tobacco. He reports that he drinks alcohol. He reports that he does not use illicit drugs.  Allergies  Allergen Reactions  . Other     Beer and Wine- swelling/headache/nausea    Family History  Problem Relation Age of Onset  . Heart disease Father   . Cancer Father     prostate  . Heart attack Father   . Cancer - Colon Mother   . Thyroid disease Mother   . Stroke Maternal Grandfather   . Hypertension Maternal Grandfather   . Stroke Paternal Grandfather   . Hypertension Paternal Grandfather   . Cancer - Lung Cousin     Prior to Admission medications   Medication Sig Start Date End Date Taking? Authorizing Provider  clopidogrel (PLAVIX) 75 MG tablet Take 1 tablet (75 mg total) by mouth daily with breakfast. 04/12/13  Yes Ivan Anchors Love, PA-C  ibuprofen (ADVIL,MOTRIN) 800 MG tablet Take 800 mg by mouth every 8 (eight) hours as needed for pain.   Yes Historical Provider, MD  metoprolol succinate (TOPROL-XL) 50 MG 24 hr tablet Take 25 mg by mouth daily. Take with or immediately following a meal.   Yes Historical Provider, MD  Multiple Vitamin (MULTIVITAMIN WITH MINERALS) TABS Take 1 tablet by mouth daily.   Yes Historical Provider, MD  pantoprazole (PROTONIX) 40 MG tablet Take 40 mg by mouth daily. 05/10/13  Yes Historical Provider, MD  simvastatin (ZOCOR) 20 MG tablet Take 1 tablet (20 mg total) by mouth daily at 6 PM. 04/12/13  Yes Ivan Anchors Love, PA-C  Histrelin Acetate (VANTAS [place])  Inject into the skin.    Historical Provider, MD    Physical Exam: Filed Vitals:   12/30/13 1633 12/30/13 1650 12/30/13 1830  BP: 134/65  149/86  Pulse: 72  74  Temp:   97.6 F (36.4 C)  TempSrc:   Oral  Resp: 18  22  Height: 6\' 2"  (1.88 m)    Weight: 94.348 kg (208 lb)    SpO2: 96% 96% 95%    Physical Exam  Constitutional: Appears well-developed and well-nourished. No distress.  HENT: Normocephalic. No tonsillar erythema or exudates Eyes: Conjunctivae and EOM are normal. PERRLA, no scleral icterus.  Neck: Normal ROM. Neck supple. No JVD.  CVS: RRR, S1/S2 appreciated, SEM appreciated  Pulmonary: Effort and breath sounds normal, no stridor, rhonchi, wheezes, rales.  Abdominal: Soft. BS +,  no distension, tenderness, rebound or guarding.  Musculoskeletal: Normal range of motion. No edema and no tenderness.  Lymphadenopathy: No lymphadenopathy noted, cervical, inguinal. Neuro: Alert. No focal neurologic deficits Skin: Skin is warm and dry. Psychiatric: Normal mood and affect. Behavior, judgment, thought content normal.   Labs on Admission:  Basic Metabolic Panel:  Recent Labs Lab 12/30/13 1722  NA 140  K 3.5*  CL 102  CO2 21  GLUCOSE 103*  BUN 21  CREATININE 1.17  CALCIUM 9.2   CBC:  Recent Labs Lab 12/30/13 1722  WBC 7.8  HGB 13.2  HCT 38.5*  MCV 92.3  PLT 325   Radiological Exams on Admission: Dg Chest 2 View   12/30/2013   Moderate left pleural effusion with left lower lobe atelectasis or infiltrate. Diffuse opacities throughout the lungs may reflect new pulmonary edema.     Faye Ramsay, MD  [facility] Pager [phone]  If 7PM-7AM, please contact night-coverage [email or web address] Password [email or web address] 12/30/2013, 8:15 PM

## 2013-12-30 NOTE — ED Notes (Signed)
Patient states he has a history of metastatic prostate cancer and the weakness has gotten progressively worse.

## 2013-12-30 NOTE — ED Provider Notes (Signed)
CSN: [id number]     Arrival date & time 12/30/13  1631 History   First MD Initiated Contact with Patient 12/30/13 1638     Chief Complaint  Patient presents with  . Weakness   (Consider location/radiation/quality/duration/timing/severity/associated sxs/prior Treatment) HPI Comments: Decreased PO intake, decreased urine output for past few days. Has hx of metastatic prostate cancer causing weakness, but his baseline weakness is much worse.   Patient is a 80 y.o. male presenting with weakness. The history is provided by the patient.  Weakness This is a new problem. The current episode started more than 2 days ago. The problem occurs constantly. The problem has been gradually worsening. Pertinent negatives include no chest pain, no abdominal pain and no shortness of breath. Nothing aggravates the symptoms.    Past Medical History  Diagnosis Date  . Hypertension   . GERD (gastroesophageal reflux disease)   . Dyslipidemia   . Aortic stenosis   . Prostate cancer 09/2009  . 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 Joint pain   . Double vision   . Murmur   . Hearing loss   . Incontinence of urine   . Hypercholesterolemia   . 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 Degenerative arthritis     osteoarthritis  . Stroke 03/29/13  . Hx of radiation therapy 09/30/13- 10/11/13    right hip/ischium, 3000 cGy 10 sessions  . Esophageal stricture   . Hiatal hernia   . Diverticulosis    Past Surgical History  Procedure Laterality Date  . Tonsillectomy  1938  . Cryosurgery prostate  09/2009    prostate cancer, Gleason 7   Family History  Problem Relation Age of Onset  . Heart disease Father   . Cancer Father     prostate  . Heart attack Father   . Cancer - Colon Mother   . Thyroid disease Mother   . Stroke Maternal Grandfather   . Hypertension Maternal Grandfather   . Stroke Paternal Grandfather   . Hypertension Paternal Grandfather   . Cancer - Lung Cousin    History   Substance Use Topics  . Smoking status: Never Smoker   . Smokeless tobacco: Never Used  . Alcohol Use: Yes     Comment: occassionally    Review of Systems  Constitutional: Negative for fever and chills.  Respiratory: Negative for cough and shortness of breath.   Cardiovascular: Negative for chest pain.  Gastrointestinal: Positive for nausea and vomiting. Negative for abdominal pain.  Genitourinary: Negative for difficulty urinating.  Neurological: Positive for weakness.  All other systems reviewed and are negative.    Allergies  Other  Home Medications   Current Outpatient Rx  Name  Route  Sig  Dispense  Refill  . clopidogrel (PLAVIX) 75 MG tablet   Oral   Take 1 tablet (75 mg total) by mouth daily with breakfast.   30 tablet   1   . Histrelin Acetate (VANTAS [place])   Subcutaneous   Inject into the skin.         Marland Kitchen ibuprofen (ADVIL,MOTRIN) 800 MG tablet   Oral   Take 800 mg by mouth every 8 (eight) hours as needed for pain.         . metoprolol succinate (TOPROL-XL) 50 MG 24 hr tablet   Oral   Take 25 mg by mouth daily. Take with or immediately following a meal.         . Multiple Vitamin (MULTIVITAMIN  WITH MINERALS) TABS   Oral   Take 1 tablet by mouth daily.         . pantoprazole (PROTONIX) 40 MG tablet   Oral   Take 40 mg by mouth daily.         . simvastatin (ZOCOR) 20 MG tablet   Oral   Take 1 tablet (20 mg total) by mouth daily at 6 PM.   30 tablet   1    BP 134/65  Pulse 72  Resp 18  Ht 6\' 2"  (1.88 m)  Wt 208 lb (94.348 kg)  BMI 26.69 kg/m2  SpO2 96% Physical Exam  Nursing note and vitals reviewed. Constitutional: He is oriented to person, place, and time. He appears well-developed and well-nourished. No distress.  HENT:  Head: Normocephalic and atraumatic.  Mouth/Throat: Oropharynx is clear and moist. No oropharyngeal exudate.  Eyes: EOM are normal. Pupils are equal, round, and reactive to light.  Neck: Normal range of motion.  Neck supple.  Cardiovascular: Normal rate and regular rhythm.  Exam reveals no friction rub.   Murmur heard.  Systolic murmur is present with a grade of 3/6  Pulmonary/Chest: Effort normal and breath sounds normal. No respiratory distress. He has no wheezes. He has no rales.  Abdominal: Soft. He exhibits no distension. There is no tenderness. There is no rebound.  Musculoskeletal: Normal range of motion. He exhibits no edema.  Neurological: He is alert and oriented to person, place, and time. No cranial nerve deficit. He exhibits normal muscle tone.  Skin: No rash noted. He is not diaphoretic.    ED Course  Procedures (including critical care time) Labs Review Labs Reviewed  CBC  BASIC METABOLIC PANEL  URINALYSIS, ROUTINE W REFLEX MICROSCOPIC   Imaging Review Dg Chest 2 View  12/30/2013   CLINICAL DATA:  Weakness, shortness of breath.  EXAM: CHEST  2 VIEW  COMPARISON:  12/10/2013  FINDINGS: Moderate left pleural effusion, stable since prior study. Left lower lobe atelectasis or infiltrate. Diffuse opacities now project throughout both lungs, likely mild edema. Heart is borderline in size.  Advanced degenerative changes in the right shoulder. The previously described left seventh rib lesion not visualized on this study.  IMPRESSION: Moderate left pleural effusion with left lower lobe atelectasis or infiltrate. Diffuse opacities throughout the lungs may reflect new pulmonary edema.   Electronically Signed   By: Rolm Baptise M.D.   On: 12/30/2013 19:27    EKG Interpretation    Date/Time:  Monday December 30 2013 16:38:33 EST Ventricular Rate:  68 PR Interval:  142 QRS Duration: 96 QT Interval:  502 QTC Calculation: 534 R Axis:   27 Text Interpretation:  Sinus rhythm Probable left atrial enlargement Probable anterior infarct, age indeterminate Prolonged QT interval Diffuse T wave flattening in inferior, anterior leads, similar to prior Confirmed by Mingo Amber  MD, [place] ([id number]) on 12/30/2013  4:44:36 PM            MDM   1. Metastasis from malignant tumor of prostate   2. Bone metastases   3. Brainstem infarct, acute    80 year old male with history of metastatic prostate cancer presents with weakness. Venous over the past 3 days has worsened. He states vomiting x3, nausea. He denies any abdominal pain or diarrhea. He's had severely decreased appetite and markedly decreased urine output. Wife reports darkened urine. He denies any fevers. Here he is alert and oriented. His vitals are stable. He has moist mucous membranes. His lungs are clear. His  belly is benign. Concern for possible dehydration. Will check labs and CXR. Labs show normal lactic acid, normal renal function. CXR shows new L pleural effusion with infiltrate. CAP coverage given. Admitted.    Osvaldo Shipper, MD 12/30/13 [id number]

## 2013-12-31 ENCOUNTER — Inpatient Hospital Stay (HOSPITAL_COMMUNITY): Payer: Medicare Other

## 2013-12-31 ENCOUNTER — Telehealth: Payer: Self-pay | Admitting: *Deleted

## 2013-12-31 DIAGNOSIS — R609 Edema, unspecified: Secondary | ICD-10-CM

## 2013-12-31 DIAGNOSIS — R630 Anorexia: Secondary | ICD-10-CM

## 2013-12-31 DIAGNOSIS — R634 Abnormal weight loss: Secondary | ICD-10-CM

## 2013-12-31 DIAGNOSIS — J90 Pleural effusion, not elsewhere classified: Secondary | ICD-10-CM

## 2013-12-31 DIAGNOSIS — I080 Rheumatic disorders of both mitral and aortic valves: Secondary | ICD-10-CM

## 2013-12-31 LAB — PROTEIN, BODY FLUID: TOTAL PROTEIN, FLUID: 1.8 g/dL

## 2013-12-31 LAB — ALBUMIN, FLUID (OTHER): Albumin, Fluid: 1.3 g/dL

## 2013-12-31 LAB — BODY FLUID CELL COUNT WITH DIFFERENTIAL
Lymphs, Fluid: 18 %
Monocyte-Macrophage-Serous Fluid: 69 % (ref 50–90)
Neutrophil Count, Fluid: 13 % (ref 0–25)
WBC FLUID: 187 uL (ref 0–1000)

## 2013-12-31 LAB — LACTATE DEHYDROGENASE, PLEURAL OR PERITONEAL FLUID: LD, Fluid: 62 U/L — ABNORMAL HIGH (ref 3–23)

## 2013-12-31 LAB — BASIC METABOLIC PANEL
BUN: 20 mg/dL (ref 6–23)
CO2: 20 mEq/L (ref 19–32)
Calcium: 8.8 mg/dL (ref 8.4–10.5)
Chloride: 107 mEq/L (ref 96–112)
Creatinine, Ser: 1.13 mg/dL (ref 0.50–1.35)
GFR calc non Af Amer: 60 mL/min — ABNORMAL LOW (ref >90)
GFR, EST AFRICAN AMERICAN: 69 mL/min — AB (ref >90)
GLUCOSE: 99 mg/dL (ref 70–99)
POTASSIUM: 3.5 meq/L — AB (ref 3.7–5.3)
SODIUM: 143 meq/L (ref 137–147)

## 2013-12-31 LAB — LEGIONELLA ANTIGEN, URINE: Legionella Antigen, Urine: NEGATIVE

## 2013-12-31 LAB — EXPECTORATED SPUTUM ASSESSMENT W GRAM STAIN, RFLX TO RESP C

## 2013-12-31 LAB — CBC
HCT: 33.8 % — ABNORMAL LOW (ref 39.0–52.0)
Hemoglobin: 11.6 g/dL — ABNORMAL LOW (ref 13.0–17.0)
MCH: 32 pg (ref 26.0–34.0)
MCHC: 34.3 g/dL (ref 30.0–36.0)
MCV: 93.1 fL (ref 78.0–100.0)
Platelets: 327 10*3/uL (ref 150–400)
RBC: 3.63 MIL/uL — AB (ref 4.22–5.81)
RDW: 14 % (ref 11.5–15.5)
WBC: 7.5 10*3/uL (ref 4.0–10.5)

## 2013-12-31 LAB — URINE MICROSCOPIC-ADD ON

## 2013-12-31 LAB — URINALYSIS, ROUTINE W REFLEX MICROSCOPIC
Glucose, UA: NEGATIVE mg/dL
HGB URINE DIPSTICK: NEGATIVE
Ketones, ur: NEGATIVE mg/dL
Leukocytes, UA: NEGATIVE
Nitrite: NEGATIVE
PH: 5.5 (ref 5.0–8.0)
Protein, ur: 30 mg/dL — AB
SPECIFIC GRAVITY, URINE: 1.03 (ref 1.005–1.030)
Urobilinogen, UA: 0.2 mg/dL (ref 0.0–1.0)

## 2013-12-31 LAB — INFLUENZA PANEL BY PCR (TYPE A & B)
H1N1FLUPCR: NOT DETECTED
INFLBPCR: NEGATIVE
Influenza A By PCR: NEGATIVE

## 2013-12-31 LAB — STREP PNEUMONIAE URINARY ANTIGEN: Strep Pneumo Urinary Antigen: NEGATIVE

## 2013-12-31 LAB — EXPECTORATED SPUTUM ASSESSMENT W REFEX TO RESP CULTURE

## 2013-12-31 LAB — GLUCOSE, CAPILLARY: GLUCOSE-CAPILLARY: 105 mg/dL — AB (ref 70–99)

## 2013-12-31 MED ORDER — PRO-STAT SUGAR FREE PO LIQD
30.0000 mL | Freq: Two times a day (BID) | ORAL | Status: DC
  Administered 2013-12-31 – 2014-01-05 (×10): 30 mL via ORAL
  Filled 2013-12-31 (×14): qty 30

## 2013-12-31 MED ORDER — RESOURCE INSTANT PROTEIN PO PWD PACKET
1.0000 | Freq: Two times a day (BID) | ORAL | Status: DC
  Administered 2013-12-31 – 2014-01-11 (×14): 6 g via ORAL
  Filled 2013-12-31 (×5): qty 6
  Filled 2013-12-31: qty 227
  Filled 2013-12-31: qty 6

## 2013-12-31 MED ORDER — FUROSEMIDE 10 MG/ML IJ SOLN: 40.0000 mg | Freq: Once | INTRAMUSCULAR | Status: DC

## 2013-12-31 MED ORDER — SODIUM CHLORIDE 0.9 % IV SOLN: 250.0000 mL | INTRAVENOUS | Status: DC | PRN

## 2013-12-31 NOTE — Progress Notes (Addendum)
INITIAL NUTRITION ASSESSMENT  DOCUMENTATION CODES Per approved criteria  -Non-severe (moderate) malnutrition in the context of chronic illness  Pt meets criteria for moderate MALNUTRITION in the context of chronic illness as evidenced by 5% weight loss in one month, moderate wasting in upper arms and occipital region, and PO intake <75% est nutrition needs for > one month   INTERVENTION: -Recommend Pro-stat and Beneprotein BID -Encouraged small frequent meals  -Recommend afternoon nourishment -Recommend pt receive room service assist to encourage PO intake -Discussed suggestions to assist in early satiety   NUTRITION DIAGNOSIS: Inadequate oral intake related to abd pain/early satiety as evidenced by PO intake <75%.   Goal: Pt to meet >/= 90% of their estimated nutrition needs    Monitor:  Total protein/energy intake, GI profile, labs, weights  Reason for Assessment: Consult/MST  80 y.o. 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 Alen Blew of oncology and also Dr. Hartley Barefoot, hypertension, dyslipidemia who presented to [facility] ED 12/30/2013 with complaints of progressive weakness, fatigue, failure to thrive over past couple of days prior to this admission.Pt reported ongoing nausea with associated vomiting. No significant abdominal discomfort reported. No chest pain, no palpitations but he does endorse dyspnea on exertion and shortness of breath in general. No reports of blood in stool or urine. No diarrhea or constipation.  -Pt with feelings of early satiety for three weeks with has significantly inhibited PO intake. Is only able to consume small amounts of meals, approximately 25%. Attributes lack of appetite to radiation treatments -Encouraged snacks/frequent meals and consuming liquids between meals to  assist in preventing early satiety -Reported feelings of nausea/vomiting that also inhibit PO intake. Admitted with dehydration -Pt reported weight loss has occurred but was unable to quantify amount. Has lost 10 lbs in one month, and 20 lbs in 3 months per previous medical records -Assisted pt in ordering meals. Will place on room service assist program to ensure pt's choices/preferences are honored -Declined use of supplementation as he feels Ensure/Boost are "too heavy" and fill him up too quickly.  -Was willing to try to incorporate snack of yogurt and skim milk. Declined use of whole milk as the full fat products also fill him up quickly.  -MD noted pt with prostate cancer with bone mets and possible new lung mets -Pt with visible signs of moderate wasting in occipital, upper arm and temple region. -ADDENDUM: Spoke with pt's wife later today, pt has been complying with BRAT diet to assist with GI intolerances, but continues to only take 1-2 bites of meals. Pt disliked vanilla and chocolate flavors of nutrition supplement, wife was willing to encourage strawberry flavor -Wife is was also in agreement for pt to try Beneprotein and Pro-Stat protein supplements. Low volume of supplements may be beneficial for pt's early satiety     Height: Ht Readings from Last 1 Encounters:  12/30/13 6\' 2"  (1.88 m)    Weight: Wt Readings from Last 1 Encounters:  12/31/13 200 lb 13.4 oz (91.1 kg)    Ideal Body Weight: 190 lbs  % Ideal Body Weight: 105%  Wt Readings from Last 10 Encounters:  12/31/13 200 lb 13.4 oz (91.1 kg)  11/19/13 210 lb (95.255 kg)  11/08/13 207 lb (93.895 kg)  10/07/13 218 lb (98.884 kg)  09/30/13 218 lb 4.8 oz (99.02 kg)  05/31/13 208 lb (94.348 kg)  04/26/13 205 lb 6.4 oz (93.169 kg)  04/10/13 199 lb 6.4 oz (90.447 kg)  03/29/13 199 lb (90.266 kg)    Usual Body Weight: 220 lbs  % Usual Body Weight: 91%  BMI:  Body mass index is 25.78 kg/(m^2).  Estimated  Nutritional Needs: Kcal: 9528-4132 Protein:110-120 gram Fluid: 2700 ml/daily  Skin: WDL  Diet Order: General  EDUCATION NEEDS: -Education needs addressed   Intake/Output Summary (Last 24 hours) at 12/31/13 0953 Last data filed at 12/31/13 0152  Gross per 24 hour  Intake      0 ml  Output    100 ml  Net   -100 ml    Last BM: 1/20  Labs:   Recent Labs Lab 12/30/13 1722 12/31/13 0455  NA 140 143  K 3.5* 3.5*  CL 102 107  CO2 21 20  BUN 21 20  CREATININE 1.17 1.13  CALCIUM 9.2 8.8  GLUCOSE 103* 99    CBG (last 3)   Recent Labs  12/31/13 0738  GLUCAP 105*    Scheduled Meds: . azithromycin  500 mg Oral Q24H  . cefTRIAXone (ROCEPHIN)  IV  1 g Intravenous Q24H  . clopidogrel  75 mg Oral Q breakfast  . enoxaparin (LOVENOX) injection  40 mg Subcutaneous Q24H  . metoprolol succinate  25 mg Oral Daily  . multivitamin with minerals  1 tablet Oral Daily  . pantoprazole  40 mg Oral Daily  . simvastatin  20 mg Oral q1800  . sodium chloride  3 mL Intravenous Q12H    Continuous Infusions:   Past Medical History  Diagnosis Date  . Hypertension   . GERD (gastroesophageal reflux disease)   . Dyslipidemia   . Aortic stenosis   . Prostate cancer 09/2009  . 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 Joint pain   . Double vision   . Murmur   . Hearing loss   . Incontinence of urine   . Hypercholesterolemia   . 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 Degenerative arthritis     osteoarthritis  . Stroke 03/29/13  . Hx of radiation therapy 09/30/13- 10/11/13    right hip/ischium, 3000 cGy 10 sessions  . Esophageal stricture   . Hiatal hernia   . Diverticulosis     Past Surgical History  Procedure Laterality Date  . Tonsillectomy  1938  . Cryosurgery prostate  09/2009    prostate cancer, Gleason Linganore LDN Clinical Dietitian [phone]

## 2013-12-31 NOTE — Procedures (Signed)
Successful US guided left thoracentesis. Yielded 2L of clear yellow fluid. Pt tolerated procedure well. No immediate complications.  Specimen was sent for labs. CXR ordered.  Ascencion Dike PA-C 12/31/2013 2:00 PM

## 2013-12-31 NOTE — Progress Notes (Signed)
[facility] CONSULT NOTE  Patient Care Team: Teressa Lower, MD as PCP - General (Unknown Physician Specialty)  CHIEF COMPLAINTS/PURPOSE OF CONSULTATION:  History of metastatic prostate cancer, possible malignant pleural effusion  HISTORY OF PRESENTING ILLNESS:  Lucas Green 80 y.o. male is here because of anorexia, weight loss and failure to thrive This is a patient of Dr.Shadad that with background history of metastatic prostate cancer. He last saw Dr. Alen Blew in November of 2014 The patient had palliative radiation therapy, complicated by October 0623 for bony metastasis He also followup with the urologist on a regular basis With his progressive anorexia and weight loss, he had been prescribed Megace but has not started taking it for fear of side effects He denies any recent urinary difficulties. He has increasing shortness of breath and on admission was found to have high BNP and pleural effusion. He had been complaining of productive cough on a regular basis of clear sputum He underwent therapeutic thoracentesis with 2 L of fluid removed. Cytology from the pleural effusion is still pending He had background history of valvular heart disease with aortic stenosis, discovered 50 years ago and was being monitored. He does not followup with a cardiologist on a regular basis Over the past month, he has progressive lower extremity edema. He denies any chest pain on exertion, dizziness or lightheadedness He had history of stroke last year thought to be related to hormonal suppression therapy but according to the patient have fully recover from it He denies any new bone pain  MEDICAL HISTORY:  Past Medical History  Diagnosis Date  . Hypertension   . GERD (gastroesophageal reflux disease)   . Dyslipidemia   . Aortic stenosis   . Prostate cancer 09/2009  . 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 Joint pain   . Double vision   . Murmur   . Hearing  loss   . Incontinence of urine   . Hypercholesterolemia   . 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 Degenerative arthritis     osteoarthritis  . Stroke 03/29/13  . Hx of radiation therapy 09/30/13- 10/11/13    right hip/ischium, 3000 cGy 10 sessions  . Esophageal stricture   . Hiatal hernia   . Diverticulosis     SURGICAL HISTORY: Past Surgical History  Procedure Laterality Date  . Tonsillectomy  1938  . Cryosurgery prostate  09/2009    prostate cancer, Gleason 7    SOCIAL HISTORY: History   Social History  . Marital Status: Married    Spouse Name: N/A    Number of Children: 1  . Years of Education: MD   Occupational History  . Not on file.   Social History Main Topics  . Smoking status: Never Smoker   . Smokeless tobacco: Never Used  . Alcohol Use: Yes     Comment: occassionally  . Drug Use: No  . Sexual Activity: Not on file   Other Topics Concern  . Not on file   Social History Narrative  . No narrative on file    FAMILY HISTORY: Family History  Problem Relation Age of Onset  . Heart disease Father   . Cancer Father     prostate  . Heart attack Father   . Cancer - Colon Mother   . Thyroid disease Mother   . Stroke Maternal Grandfather   . Hypertension Maternal Grandfather   . Stroke Paternal Grandfather   . Hypertension Paternal Grandfather   .  Cancer - Lung Cousin     ALLERGIES:  is allergic to other.  MEDICATIONS:  Current Facility-Administered Medications  Medication Dose Route Frequency Provider Last Rate Last Dose  . 0.9 %  sodium chloride infusion  250 mL Intravenous PRN Theodis Blaze, MD      . albuterol (PROVENTIL) (2.5 MG/3ML) 0.083% nebulizer solution 2.5 mg  2.5 mg Nebulization Q2H PRN Theodis Blaze, MD      . azithromycin [facility]) tablet 500 mg  500 mg Oral Q24H Theodis Blaze, MD      . cefTRIAXone (ROCEPHIN) 1 g in dextrose 5 % 50 mL IVPB  1 g Intravenous Q24H Theodis Blaze, MD      . clopidogrel (PLAVIX) tablet 75 mg  75 mg Oral Q breakfast  Theodis Blaze, MD   75 mg at 12/31/13 0754  . enoxaparin (LOVENOX) injection 40 mg  40 mg Subcutaneous Q24H Theodis Blaze, MD   40 mg at 12/30/13 2229  . feeding supplement (PRO-STAT SUGAR FREE 64) liquid 30 mL  30 mL Oral BID WC Hazle Coca, RD      . metoprolol succinate (TOPROL-XL) 24 hr tablet 25 mg  25 mg Oral Daily Theodis Blaze, MD   25 mg at 12/31/13 1039  . multivitamin with minerals tablet 1 tablet  1 tablet Oral Daily Theodis Blaze, MD   1 tablet at 12/31/13 1039  . oxyCODONE-acetaminophen (PERCOCET/ROXICET) 5-325 MG per tablet 1-2 tablet  1-2 tablet Oral Q3H PRN Theodis Blaze, MD      . pantoprazole (PROTONIX) EC tablet 40 mg  40 mg Oral Daily Theodis Blaze, MD   40 mg at 12/31/13 1039  . protein supplement (RESOURCE BENEPROTEIN) powder packet 6 g  1 scoop Oral BID WC Hazle Coca, RD      . simvastatin (ZOCOR) tablet 20 mg  20 mg Oral q1800 Theodis Blaze, MD      . sodium chloride 0.9 % injection 3 mL  3 mL Intravenous Q12H Theodis Blaze, MD   3 mL at 12/30/13 2227  . sodium chloride 0.9 % injection 3 mL  3 mL Intravenous PRN Theodis Blaze, MD        REVIEW OF SYSTEMS:   Constitutional: Denies fevers, chills or abnormal night sweats Eyes: Denies blurriness of vision, double vision or watery eyes Ears, nose, mouth, throat, and face: Denies mucositis or sore throat Gastrointestinal:  Denies nausea, heartburn or change in bowel habits Skin: Denies abnormal skin rashes Lymphatics: Denies new lymphadenopathy or easy bruising Neurological:Denies numbness, tingling or new weaknesses Behavioral/Psych: Mood is stable, no new changes  All other systems were reviewed with the patient and are negative.  PHYSICAL EXAMINATION: ECOG PERFORMANCE STATUS: 2 - Symptomatic, <50% confined to bed  Filed Vitals:   12/31/13 1439  BP: 106/75  Pulse: 89  Temp: 98.1 F (36.7 C)  Resp: 20   Filed Weights   12/30/13 1633 12/30/13 2059 12/31/13 0439  Weight: 208 lb (94.348 kg) 200  lb 9.9 oz (91 kg) 200 lb 13.4 oz (91.1 kg)    GENERAL:alert, no distress and comfortable. He looked pale  SKIN: skin color, texture, turgor are normal, no rashes or significant lesions EYES: normal, conjunctiva are pink and non-injected, sclera clear OROPHARYNX:no exudate, no erythema and lips, buccal mucosa, and tongue normal  NECK: supple, thyroid normal size, non-tender, without nodularity LYMPH:  no palpable lymphadenopathy in the cervical, axillary or inguinal LUNGS: clear  to auscultation and percussion with mildly increased breathing effort HEART:  regular rate and rhythm with a soft systolic murmur on the left sternal border and moderate bilateral lower extremity edema ABDOMEN:abdomen soft, non-tender and normal bowel sounds Musculoskeletal:no cyanosis of digits and no clubbing  PSYCH: alert & oriented x 3 with fluent speech NEURO: no focal motor/sensory deficits  LABORATORY DATA:  I have reviewed the data as listed Lab Results  Component Value Date   WBC 7.5 12/31/2013   HGB 11.6* 12/31/2013   HCT 33.8* 12/31/2013   MCV 93.1 12/31/2013   PLT 327 12/31/2013   Lab Results  Component Value Date   NA 143 12/31/2013   K 3.5* 12/31/2013   CL 107 12/31/2013   CO2 20 12/31/2013    RADIOGRAPHIC STUDIES: I have personally reviewed the radiological images as listed and agreed with the findings in the report. Dg Chest 1 View  12/31/2013   CLINICAL DATA:  Post left thoracentesis  EXAM: CHEST - 1 VIEW  COMPARISON:  12/1913  FINDINGS: There is significant decreased opacity on the left reflecting evacuation of most of the left pleural effusion. There is no pneumothorax. Opacity at the left lung base may reflect infiltrate or atelectasis.  There is vascular congestion centrally throughout the right lung with mild diffuse hazy opacity similar to the prior study.  IMPRESSION: No pneumothorax following thoracentesis on the left. Marked reduction in left pleural effusion.   Electronically Signed   By:  Lajean Manes M.D.   On: 12/31/2013 14:32   ASSESSMENT:  #1 metastatic prostate cancer #2 pleural effusion #3 valvular heart disease  PLAN:  #1 metastatic prostate cancer I would defer to Dr. Alen Blew for management. He has appointment to see him next week. I will order a PSA and testosterone level tomorrow so that results will be available by next week. Continue supportive care for now #2 pleural effusion This could be malignant. Cytology is pending. #3 valvular heart disease The patient has a loud murmur with elevated BNP and lower extremity edema, worrisome for congestive heart failure. I recommend cardiology consultation and he agreed #4 leg edema This could be related to problem #3. #5 anorexia #6 an unspecified weight loss I recommend he continue on nutritional supplements. Due to fear of side effects, I think it is not unreasonable to hold off taking Megace for now #7 DVT prophylaxis He is on Lovenox #8 metastatic disease to the bone The patient would be a candidate for bisphosphonates therapy. Again I would defer to his oncologist for further management. I noted his alkaline phosphatase is elevated but that could the related to pleural effusion as well. He can complete staging scan as an outpatient.  He has appointment to see Dr. Alen Blew next week. I am available to assist in any way I can. This would not hesitate to call me if questions arise.  All questions were answered. The patient knows to call the clinic with any problems, questions or concerns.  Ama Mcmaster, MD @T @ 3:18 PM

## 2013-12-31 NOTE — Progress Notes (Signed)
Patient Demographics  Lucas Green, is a 80 y.o. male, DOB - 1932/06/06, [id number]  Admit date - 12/30/2013   Admitting Physician Theodis Blaze, MD  Outpatient Primary MD for the patient is Cannon, MD  LOS - 1   Chief Complaint  Patient presents with  . Weakness        Assessment & Plan    Weakness generalized, failure to thrive with moderate proteinuria malnutrition  - likely related to metastatic prostate cancer, bone mets and maybe even new lung mets as moderate pleural effusion seen on CXR  - needs PT/OT eval  - needs nutrition consult, protein supplementation  - supportive care with IV fluids, encourage PO intake     Pleural effusion, left  - likely malignant  - order placed for thoracentesis, diagnostic and therapeutic,  oxygen support via nasal canula to keep O2 saturation above 90%     CAP (community acquired pneumonia)  - order placed for azithromycin and rocephin  - follow up blood cultures, resp culture     Hypertension  - continue metoprolol     Prostate cancer with bone metastases  - under GU and Dr. Hazeline Junker care  - Dr. Alen Blew is of have consulted oncologist on call    Hypokalemia  - repleted currently stable will monitor    Nausea and vomiting, dehydration  - likely due to prostate cancer  - Currently improved after supportive care with Zofran and IV fluids. Hold IV fluids now.    Dyslipidemia  - continue statin therapy     Moderate protein-calorie malnutrition  - nutrition consulted    Asymmetric edema left leg more than right.  Check Venous duplex     Code Status: DNR, discussed in detail with patient  Family Communication   Disposition Plan: Home   Procedures IR guided thoracentesis, Venos [place]   Consults  IR,  Oncology   Medications  Scheduled Meds: . azithromycin  500 mg Oral Q24H  . cefTRIAXone (ROCEPHIN)  IV  1 g Intravenous Q24H  . clopidogrel  75 mg Oral Q breakfast  . enoxaparin (LOVENOX) injection  40 mg Subcutaneous Q24H  . metoprolol succinate  25 mg Oral Daily  . multivitamin with minerals  1 tablet Oral Daily  . pantoprazole  40 mg Oral Daily  . simvastatin  20 mg Oral q1800  . sodium chloride  3 mL Intravenous Q12H   Continuous Infusions:  PRN Meds:.sodium chloride, albuterol, oxyCODONE-acetaminophen, sodium chloride  DVT Prophylaxis  Lovenox    Lab Results  Component Value Date   PLT 327 12/31/2013    Antibiotics     Anti-infectives   Start     Dose/Rate Route Frequency Ordered Stop   12/31/13 2000  cefTRIAXone (ROCEPHIN) 1 g in dextrose 5 % 50 mL IVPB     1 g 100 mL/hr over 30 Minutes Intravenous Every 24 hours 12/30/13 2106 01/07/14 1959   12/31/13 2000  azithromycin (ZITHROMAX) tablet 500 mg     500 mg Oral Every 24 hours 12/30/13 2106 01/07/14 1959   12/30/13 1945  cefTRIAXone (ROCEPHIN) 1 g in dextrose 5 % 50 mL IVPB     1 g 100 mL/hr over 30 Minutes Intravenous  Once 12/30/13 1939 12/30/13 2039  12/30/13 1945  azithromycin (ZITHROMAX) 500 mg in dextrose 5 % 250 mL IVPB  Status:  Discontinued     500 mg 250 mL/hr over 60 Minutes Intravenous  Once 12/30/13 1939 12/30/13 2106          Subjective:   Lucas Green today has, No headache, No chest pain, No abdominal pain - No Nausea, No new weakness tingling or numbness, No Cough - SOB.    Objective:   Filed Vitals:   12/30/13 2059 12/30/13 2223 12/30/13 2226 12/31/13 0439  BP: 160/85 135/73  131/77  Pulse: 81  84 75  Temp: 97.5 F (36.4 C)   98 F (36.7 C)  TempSrc: Axillary   Axillary  Resp: 20   18  Height: 6\' 2"  (1.88 m)     Weight: 91 kg (200 lb 9.9 oz)   91.1 kg (200 lb 13.4 oz)  SpO2: 99%   100%    Wt Readings from Last 3 Encounters:  12/31/13 91.1 kg (200 lb 13.4 oz)  11/19/13  95.255 kg (210 lb)  11/08/13 93.895 kg (207 lb)     Intake/Output Summary (Last 24 hours) at 12/31/13 1236 Last data filed at 12/31/13 1100  Gross per 24 hour  Intake    240 ml  Output    101 ml  Net    139 ml    Exam Awake Alert, Oriented X 3, No new F.N deficits, Normal affect [place].AT,PERRAL Supple Neck,No JVD, No cervical lymphadenopathy appriciated.  Symmetrical Chest wall movement, Good air movement bilaterally, CTAB RRR,No Gallops,Rubs or new Murmurs, No Parasternal Heave +ve B.Sounds, Abd Soft, Non tender, No organomegaly appriciated, No rebound - guarding or rigidity. No Cyanosis, Clubbing or edema, No new Rash or bruise    Data Review   Micro Results No results found for this or any previous visit (from the past 240 hour(s)).  Radiology Reports Dg Chest 2 View  12/30/2013   CLINICAL DATA:  Weakness, shortness of breath.  EXAM: CHEST  2 VIEW  COMPARISON:  12/10/2013  FINDINGS: Moderate left pleural effusion, stable since prior study. Left lower lobe atelectasis or infiltrate. Diffuse opacities now project throughout both lungs, likely mild edema. Heart is borderline in size.  Advanced degenerative changes in the right shoulder. The previously described left seventh rib lesion not visualized on this study.  IMPRESSION: Moderate left pleural effusion with left lower lobe atelectasis or infiltrate. Diffuse opacities throughout the lungs may reflect new pulmonary edema.   Electronically Signed   By: Rolm Baptise M.D.   On: 12/30/2013 19:27   Dg Chest 2 View  12/10/2013   CLINICAL DATA:  Shortness of breath with cough. Recent pneumonia. Metastatic prostate cancer.  EXAM: CHEST  2 VIEW  COMPARISON:  Radiographs 09/13/2013. Whole body bone scan 09/19/2013.  FINDINGS: Left-greater-than-right pleural effusions have enlarged. There is increased left basilar airspace disease without consolidation. There is no edema. The heart size and mediastinal contours are stable. There is sclerosis  of the left 7th rib laterally which may correspond with the focal uptake on recent bone scan. Severe glenohumeral arthropathy is noted on the right.  IMPRESSION: Enlarging left-greater-than-right pleural effusions with associated left lower lobe airspace disease. This may reflect atelectasis or any infiltrate. Possible metastasis to the left 7th rib laterally.   Electronically Signed   By: Camie Patience M.D.   On: 12/10/2013 14:57    CBC  Recent Labs Lab 12/30/13 1722 12/31/13 0455  WBC 7.8 7.5  HGB 13.2 11.6*  HCT 38.5* 33.8*  PLT 325 327  MCV 92.3 93.1  MCH 31.7 32.0  MCHC 34.3 34.3  RDW 13.8 14.0    Chemistries   Recent Labs Lab 12/30/13 1722 12/31/13 0455  NA 140 143  K 3.5* 3.5*  CL 102 107  CO2 21 20  GLUCOSE 103* 99  BUN 21 20  CREATININE 1.17 1.13  CALCIUM 9.2 8.8   ------------------------------------------------------------------------------------------------------------------ estimated creatinine clearance is 61.6 ml/min (by C-G formula based on Cr of 1.13). ------------------------------------------------------------------------------------------------------------------ No results found for this basename: HGBA1C,  in the last 72 hours ------------------------------------------------------------------------------------------------------------------ No results found for this basename: CHOL, HDL, LDLCALC, TRIG, CHOLHDL, LDLDIRECT,  in the last 72 hours ------------------------------------------------------------------------------------------------------------------ No results found for this basename: TSH, T4TOTAL, FREET3, T3FREE, THYROIDAB,  in the last 72 hours ------------------------------------------------------------------------------------------------------------------ No results found for this basename: VITAMINB12, FOLATE, FERRITIN, TIBC, IRON, RETICCTPCT,  in the last 72 hours  Coagulation profile No results found for this basename: INR, PROTIME,  in the  last 168 hours  No results found for this basename: DDIMER,  in the last 72 hours  Cardiac Enzymes No results found for this basename: CK, CKMB, TROPONINI, MYOGLOBIN,  in the last 168 hours ------------------------------------------------------------------------------------------------------------------ No components found with this basename: POCBNP,      Time Spent in minutes   35   Jacinta Penalver K M.D on 12/31/2013 at 12:36 PM  Between 7am to 7pm - Pager - [phone]  After 7pm go to [email or web address] - password [email or web address]  And look for the night coverage person covering for me after hours  [facility] Office  [phone]

## 2013-12-31 NOTE — Progress Notes (Signed)
Bladder scan showed 200cc in bladder. IV rate increased to 75 cc/hr.

## 2013-12-31 NOTE — Progress Notes (Signed)
PT Cancellation Note  Patient Details Name: Lucas Green MRN: [id number] DOB: 01-20-1932   Cancelled Treatment:    Reason Eval/Treat Not Completed: Patient at procedure or test/unavailable--attempted PT eval but pt is currently out of room. Will check back another time.  Thanks.    Weston Anna, MPT Pager: [phone]

## 2013-12-31 NOTE — Telephone Encounter (Signed)
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 He has been admitted to room 1435-01 with Pneumonia.  Believes Dr. Alen Blew will want to see him.  The plan is to remove fluid from his lungs.  Dr. Gaynelle Arabian, Dr. Valere Dross and Dr. Alen Blew are his doctors.  Expressed sentiments that I hope he gets well.  Dr. Candiss Norse is listed as the provider for this admission.  Informed spouse Dr. Alen Blew will return to office on 01-07-2014.  Dr. Rolanda Jay is scheduled for f/u on 01-09-2014.  Asked that she call if he is not able to keep this appointment.

## 2014-01-01 ENCOUNTER — Encounter (HOSPITAL_COMMUNITY): Payer: Self-pay | Admitting: Cardiology

## 2014-01-01 DIAGNOSIS — I359 Nonrheumatic aortic valve disorder, unspecified: Secondary | ICD-10-CM

## 2014-01-01 DIAGNOSIS — E785 Hyperlipidemia, unspecified: Secondary | ICD-10-CM

## 2014-01-01 DIAGNOSIS — I5031 Acute diastolic (congestive) heart failure: Secondary | ICD-10-CM

## 2014-01-01 DIAGNOSIS — I10 Essential (primary) hypertension: Secondary | ICD-10-CM

## 2014-01-01 DIAGNOSIS — I4891 Unspecified atrial fibrillation: Secondary | ICD-10-CM

## 2014-01-01 DIAGNOSIS — J189 Pneumonia, unspecified organism: Secondary | ICD-10-CM

## 2014-01-01 DIAGNOSIS — M7989 Other specified soft tissue disorders: Secondary | ICD-10-CM

## 2014-01-01 DIAGNOSIS — E440 Moderate protein-calorie malnutrition: Secondary | ICD-10-CM

## 2014-01-01 LAB — BASIC METABOLIC PANEL
BUN: 19 mg/dL (ref 6–23)
CHLORIDE: 105 meq/L (ref 96–112)
CO2: 21 meq/L (ref 19–32)
Calcium: 8.7 mg/dL (ref 8.4–10.5)
Creatinine, Ser: 1.04 mg/dL (ref 0.50–1.35)
GFR calc Af Amer: 77 mL/min — ABNORMAL LOW (ref >90)
GFR calc non Af Amer: 66 mL/min — ABNORMAL LOW (ref >90)
GLUCOSE: 116 mg/dL — AB (ref 70–99)
POTASSIUM: 3.5 meq/L — AB (ref 3.7–5.3)
Sodium: 141 mEq/L (ref 137–147)

## 2014-01-01 LAB — PROTIME-INR
INR: 1.17 (ref 0.00–1.49)
Prothrombin Time: 14.7 seconds (ref 11.6–15.2)

## 2014-01-01 LAB — CBC
HCT: 35.2 % — ABNORMAL LOW (ref 39.0–52.0)
HEMOGLOBIN: 11.8 g/dL — AB (ref 13.0–17.0)
MCH: 31.6 pg (ref 26.0–34.0)
MCHC: 33.5 g/dL (ref 30.0–36.0)
MCV: 94.1 fL (ref 78.0–100.0)
Platelets: 308 10*3/uL (ref 150–400)
RBC: 3.74 MIL/uL — ABNORMAL LOW (ref 4.22–5.81)
RDW: 14 % (ref 11.5–15.5)
WBC: 8.2 10*3/uL (ref 4.0–10.5)

## 2014-01-01 LAB — PRO B NATRIURETIC PEPTIDE: Pro B Natriuretic peptide (BNP): 13190 pg/mL — ABNORMAL HIGH (ref 0–450)

## 2014-01-01 LAB — TESTOSTERONE: Testosterone: 18 ng/dL — ABNORMAL LOW (ref 300–890)

## 2014-01-01 LAB — PSA: PSA: 26.49 ng/mL — AB (ref <=4.00)

## 2014-01-01 LAB — HEPARIN LEVEL (UNFRACTIONATED): HEPARIN UNFRACTIONATED: 0.39 [IU]/mL (ref 0.30–0.70)

## 2014-01-01 LAB — APTT: aPTT: 33 seconds (ref 24–37)

## 2014-01-01 MED ORDER — FUROSEMIDE 10 MG/ML IJ SOLN
40.0000 mg | Freq: Two times a day (BID) | INTRAMUSCULAR | Status: DC
  Administered 2014-01-01 – 2014-01-06 (×11): 40 mg via INTRAVENOUS
  Filled 2014-01-01 (×15): qty 4

## 2014-01-01 MED ORDER — HEPARIN (PORCINE) IN NACL 100-0.45 UNIT/ML-% IJ SOLN
1250.0000 [IU]/h | INTRAMUSCULAR | Status: DC
  Administered 2014-01-01 – 2014-01-06 (×7): 1250 [IU]/h via INTRAVENOUS
  Filled 2014-01-01 (×9): qty 250

## 2014-01-01 MED ORDER — HEPARIN BOLUS VIA INFUSION
4000.0000 [IU] | Freq: Once | INTRAVENOUS | Status: DC
  Filled 2014-01-01: qty 4000

## 2014-01-01 MED ORDER — POTASSIUM CHLORIDE CRYS ER 20 MEQ PO TBCR
40.0000 meq | EXTENDED_RELEASE_TABLET | Freq: Once | ORAL | Status: AC
  Administered 2014-01-01: 40 meq via ORAL
  Filled 2014-01-01: qty 2

## 2014-01-01 MED ORDER — METOPROLOL SUCCINATE ER 50 MG PO TB24
50.0000 mg | ORAL_TABLET | Freq: Every day | ORAL | Status: DC
  Administered 2014-01-01 – 2014-01-02 (×2): 50 mg via ORAL
  Filled 2014-01-01 (×4): qty 1

## 2014-01-01 NOTE — Progress Notes (Signed)
PHARMACY BRIEF NOTE - Anticoagulation  Consult for:  IV Heparin Indication:  Atrial Fibrillation  With the infusion of 1250 units/hr, the Heparin level drawn at 18:20 today is reported as 0.39 units/ml.  This level is within the therapeutic range (0.3-0.7 units/ml).  The patient's nurse reports that no bleeding problems have been observed.  Plan:  Continue Heparin at the current infusion rate.  Repeat level within 6 hours to confirm therapeutic dose, as per protocol.  [place]. 01/01/2014 7:31 PM

## 2014-01-01 NOTE — Consult Note (Signed)
Urology Consult  Referring physician:   Dr. Candiss Norse ( [facility]) Reason for referral:   Metastatic prostatic cancer Cc; Dr. Garlon Hatchet Cc: Dr. Arloa Koh cc: Dr. 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 PSA nadir post cryotherapy equals 0.66. Postop diagnosis of hypogonadism treated with testosterone gel stopped because of subsequent rise in PSA from 1.532 3.102 9.05 224.87 and 2014. The patient was treated with Wynonia Lawman, but developed CVA thought secondary to firm and on in April 2014, and medication was stopped.  The patient subsequently developed a "hot spot" on bone scan, and after pneumonia was treated, he underwent successful radiation therapy to his pelvis, with resolution of his pain. He was seen in consultation by Dr. should not, who noted continued low-level of testosterone presents, with a recommendation of hormone therapy. The patient subsequently underwent V[facility]as subcutaneous implantation, with no complications coincidental Plavix therapy.    He is post echocardiogram at the time of the stroke, with diagnoses of mitral regurgitation also. He is currently admitted with a cardiology evaluation showing significant acute diastolic congestive heart failure-the patient appears to be volume overloaded on examination per Cardiology. This may be related to progressive aortic stenosis. There may be a component of poor nutrition/low oncotic pressure. Lasix 40 mg twice a day begun, and  renal function followed. Albumin pending. In addition, he will have repeat echocardiogram. Not clear that he would be a candidate for further procedures given metastatic prostate cancer. He needs input from oncology concerning long-term prognosis. He is very frail at this point. Question candidate for TAVR.   The patient is also  noted to have paroxysmal atrial fibrillation on telemetry. Increase Toprol to 50 mg daily. He has multiple embolic risk factors including prior stroke, age greater than 85, congestive heart failure, hypertension.   Heparin for now. We will transition to Coumadin prior to discharge. There is risk with anticoagulation as he has fallen recently.  Check TSH. Patient would not be a candidate for NOAC given valve or heart disease.   Past Medical History  Diagnosis Date  . Hypertension   . GERD (gastroesophageal reflux disease)   . Dyslipidemia   . Aortic stenosis   . Prostate cancer 09/2009  . 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 Hearing loss   . Incontinence of urine   . 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 Degenerative arthritis     osteoarthritis  . Stroke 03/29/13  . Hx of radiation therapy 09/30/13- 10/11/13    right hip/ischium, 3000 cGy 10 sessions  . Esophageal stricture   . Hiatal hernia   . Diverticulosis    Past Surgical History  Procedure Laterality Date  . Tonsillectomy  1938  . Cryosurgery prostate  09/2009    prostate cancer, Gleason 7    Medications: I have reviewed the patient's current medications. Allergies: Beer and wine Allergies  Allergen Reactions  . Other     Beer and Wine- swelling/headache/nausea    Family History  Problem Relation Age of Onset  . Heart disease Father   . Cancer Father     prostate  . Heart attack Father   . Cancer - Colon Mother   . Thyroid disease Mother   . Stroke Maternal Grandfather   . Hypertension Maternal Grandfather   .  Stroke Paternal Grandfather   . Hypertension Paternal Grandfather   . Cancer - Lung Cousin    Social History:  reports that he has never smoked. He has never used smokeless tobacco. He reports that he drinks alcohol. He reports that he does not use illicit drugs.  ROS:   Weakness, frequent falls, productive cough but no fevers or chills, hemoptysis, dysphasia, odynophagia, melena,  hematochezia, dysuria, hematuria, rash, seizure activity, orthopnea, PND, claudication. Remaining systems are negative.      Physical Exam:  Vital signs in last 24 hours: Temp:  [97.6 F (36.4 C)-98.1 F (36.7 C)] 97.6 F (36.4 C) (01/21 0511) Pulse Rate:  [74-89] 74 (01/21 0511) Resp:  [20] 20 (01/21 0511) BP: (106-144)/(55-75) 122/69 mmHg (01/21 0511) SpO2:  [95 %-100 %] 100 % (01/21 0511) Weight:  [88.8 kg (195 lb 12.3 oz)] 88.8 kg (195 lb 12.3 oz) (01/21 0511)  Cardiovascular: Skin warm; not flushed Respiratory: Breaths quiet; no shortness of breath Abdomen: No masses Neurological: Normal sensation to touch Musculoskeletal: Normal motor function arms and legs Lymphatics: No inguinal adenopathy Skin: No rashes Genitourinary:  Normal GU.Marland Kitchen   Laboratory Data:  Results for orders placed during the hospital encounter of 12/30/13 (from the past 72 hour(s))  CBC     Status: Abnormal   Collection Time    12/30/13  5:22 PM      Result Value Range   WBC 7.8  4.0 - 10.5 K/uL   RBC 4.17 (*) 4.22 - 5.81 MIL/uL   Hemoglobin 13.2  13.0 - 17.0 g/dL   HCT 38.5 (*) 39.0 - 52.0 %   MCV 92.3  78.0 - 100.0 fL   MCH 31.7  26.0 - 34.0 pg   MCHC 34.3  30.0 - 36.0 g/dL   RDW 13.8  11.5 - 15.5 %   Platelets 325  150 - 400 K/uL  BASIC METABOLIC PANEL     Status: Abnormal   Collection Time    12/30/13  5:22 PM      Result Value Range   Sodium 140  137 - 147 mEq/L   Potassium 3.5 (*) 3.7 - 5.3 mEq/L   Chloride 102  96 - 112 mEq/L   CO2 21  19 - 32 mEq/L   Glucose, Bld 103 (*) 70 - 99 mg/dL   BUN 21  6 - 23 mg/dL   Creatinine, Ser 1.17  0.50 - 1.35 mg/dL   Calcium 9.2  8.4 - 10.5 mg/dL   GFR calc non Af Amer 57 (*) >90 mL/min   GFR calc Af Amer 67 (*) >90 mL/min   Comment: (NOTE)     The eGFR has been calculated using the CKD EPI equation.     This calculation has not been validated in all clinical situations.     eGFR's persistently <90 mL/min signify possible Chronic Kidney      Disease.  CG4 I-STAT (LACTIC ACID)     Status: None   Collection Time    12/30/13  5:39 PM      Result Value Range   Lactic Acid, Venous 1.66  0.5 - 2.2 mmol/L  CULTURE, BLOOD (ROUTINE X 2)     Status: None   Collection Time    12/30/13  7:55 PM      Result Value Range   Specimen Description BLOOD LEFT FOREARM     Special Requests BOTTLES DRAWN AEROBIC ONLY 3ML     Culture  Setup Time       Value:  12/31/2013 01:10     Performed at [facility]   Culture       Value:        BLOOD CULTURE RECEIVED NO GROWTH TO DATE CULTURE WILL BE HELD FOR 5 DAYS BEFORE ISSUING A FINAL NEGATIVE REPORT     Performed at [facility]   Report Status PENDING    CULTURE, BLOOD (ROUTINE X 2)     Status: None   Collection Time    12/30/13  8:00 PM      Result Value Range   Specimen Description BLOOD LEFT ARM     Special Requests BOTTLES DRAWN AEROBIC AND ANAEROBIC 5ML     Culture  Setup Time       Value: 12/31/2013 01:10     Performed at [facility]   Culture       Value:        BLOOD CULTURE RECEIVED NO GROWTH TO DATE CULTURE WILL BE HELD FOR 5 DAYS BEFORE ISSUING A FINAL NEGATIVE REPORT     Performed at [facility]   Report Status PENDING    CULTURE, BLOOD (ROUTINE X 2)     Status: None   Collection Time    12/30/13 10:10 PM      Result Value Range   Specimen Description BLOOD LEFT ARM     Special Requests BOTTLES DRAWN AEROBIC ONLY 4CC     Culture  Setup Time       Value: 12/31/2013 01:10     Performed at [facility]   Culture       Value:        BLOOD CULTURE RECEIVED NO GROWTH TO DATE CULTURE WILL BE HELD FOR 5 DAYS BEFORE ISSUING A FINAL NEGATIVE REPORT     Performed at [facility]   Report Status PENDING    PRO B NATRIURETIC PEPTIDE     Status: Abnormal   Collection Time    12/30/13 10:10 PM      Result Value Range   Pro B Natriuretic peptide (BNP) 15574.0 (*) 0 - 450 pg/mL  CULTURE, BLOOD (ROUTINE X 2)     Status: None   Collection  Time    12/30/13 10:15 PM      Result Value Range   Specimen Description BLOOD LEFT HAND     Special Requests BOTTLES DRAWN AEROBIC ONLY 2CC     Culture  Setup Time       Value: 12/31/2013 01:10     Performed at [facility]   Culture       Value:        BLOOD CULTURE RECEIVED NO GROWTH TO DATE CULTURE WILL BE HELD FOR 5 DAYS BEFORE ISSUING A FINAL NEGATIVE REPORT     Performed at [facility]   Report Status PENDING    INFLUENZA PANEL BY PCR (TYPE A & B, H1N1)     Status: None   Collection Time    12/30/13 11:25 PM      Result Value Range   Influenza A By PCR NEGATIVE  NEGATIVE   Influenza B By PCR NEGATIVE  NEGATIVE   H1N1 flu by pcr NOT DETECTED  NOT DETECTED   Comment:            The Xpert Flu assay (FDA approved for     nasal aspirates or washes and     nasopharyngeal swab specimens), is     intended as an aid in  the diagnosis of     influenza and should not be used as     a sole basis for treatment.     Performed at [facility]     Status: Abnormal   Collection Time    12/31/13  1:52 AM      Result Value Range   Color, Urine AMBER (*) YELLOW   Comment: BIOCHEMICALS MAY BE AFFECTED BY COLOR   APPearance CLOUDY (*) CLEAR   Specific Gravity, Urine 1.030  1.005 - 1.030   pH 5.5  5.0 - 8.0   Glucose, UA NEGATIVE  NEGATIVE mg/dL   Hgb urine dipstick NEGATIVE  NEGATIVE   Bilirubin Urine SMALL (*) NEGATIVE   Ketones, ur NEGATIVE  NEGATIVE mg/dL   Protein, ur 30 (*) NEGATIVE mg/dL   Urobilinogen, UA 0.2  0.0 - 1.0 mg/dL   Nitrite NEGATIVE  NEGATIVE   Leukocytes, UA NEGATIVE  NEGATIVE  LEGIONELLA ANTIGEN, URINE     Status: None   Collection Time    12/31/13  1:52 AM      Result Value Range   Specimen Description URINE, RANDOM     Special Requests NONE     Legionella Antigen, Urine       Value: Negative for Legionella pneumophilia serogroup 1     Performed at [facility]   Report Status  12/31/2013 FINAL    STREP PNEUMONIAE URINARY ANTIGEN     Status: None   Collection Time    12/31/13  1:52 AM      Result Value Range   Strep Pneumo Urinary Antigen NEGATIVE  NEGATIVE   Comment: PERFORMED AT [facility]                Infection due to S. pneumoniae     cannot be absolutely ruled out     since the antigen present     may be below the detection limit     of the test.     Performed at [facility]     Status: Abnormal   Collection Time    12/31/13  1:52 AM      Result Value Range   Squamous Epithelial / LPF RARE  RARE   WBC, UA 3-6  <3 WBC/hpf   Bacteria, UA RARE  RARE   Crystals URIC ACID CRYSTALS (*) NEGATIVE  BASIC METABOLIC PANEL     Status: Abnormal   Collection Time    12/31/13  4:55 AM      Result Value Range   Sodium 143  137 - 147 mEq/L   Potassium 3.5 (*) 3.7 - 5.3 mEq/L   Chloride 107  96 - 112 mEq/L   CO2 20  19 - 32 mEq/L   Glucose, Bld 99  70 - 99 mg/dL   BUN 20  6 - 23 mg/dL   Creatinine, Ser 1.13  0.50 - 1.35 mg/dL   Calcium 8.8  8.4 - 10.5 mg/dL   GFR calc non Af Amer 60 (*) >90 mL/min   GFR calc Af Amer 69 (*) >90 mL/min   Comment: (NOTE)     The eGFR has been calculated using the CKD EPI equation.     This calculation has not been validated in all clinical situations.     eGFR's persistently <90 mL/min signify possible Chronic Kidney     Disease.  CBC     Status: Abnormal   Collection  Time    12/31/13  4:55 AM      Result Value Range   WBC 7.5  4.0 - 10.5 K/uL   Comment: WHITE COUNT CONFIRMED ON SMEAR   RBC 3.63 (*) 4.22 - 5.81 MIL/uL   Hemoglobin 11.6 (*) 13.0 - 17.0 g/dL   HCT 33.8 (*) 39.0 - 52.0 %   MCV 93.1  78.0 - 100.0 fL   MCH 32.0  26.0 - 34.0 pg   MCHC 34.3  30.0 - 36.0 g/dL   RDW 14.0  11.5 - 15.5 %   Platelets 327  150 - 400 K/uL  GLUCOSE, CAPILLARY     Status: Abnormal   Collection Time    12/31/13  7:38 AM      Result Value Range   Glucose-Capillary 105 (*) 70 - 99 mg/dL   BODY FLUID CELL COUNT WITH DIFFERENTIAL     Status: None   Collection Time    12/31/13  2:05 PM      Result Value Range   Fluid Type-FCT PLEURAL     Comment: CORRECTED ON 01/20 AT 1413: PREVIOUSLY REPORTED AS Body Fluid   Color, Fluid YELLOW  YELLOW   Appearance, Fluid CLEAR  CLEAR   WBC, Fluid 187  0 - 1000 cu mm   Neutrophil Count, Fluid 13  0 - 25 %   Lymphs, Fluid 18     Monocyte-Macrophage-Serous Fluid 69  50 - 90 %   Other Cells, Fluid FEW LINING CELLS SEEN, SEE CYTOLOGY REPORT    BODY FLUID CULTURE     Status: None   Collection Time    12/31/13  2:05 PM      Result Value Range   Specimen Description PLEURAL     Special Requests NONE     Gram Stain       Value: NO WBC SEEN     NO ORGANISMS SEEN     Performed at [facility]   Culture PENDING     Report Status PENDING    PROTEIN, BODY FLUID     Status: None   Collection Time    12/31/13  2:05 PM      Result Value Range   Total protein, fluid 1.8     Comment: NO NORMAL RANGE ESTABLISHED FOR THIS TEST     Performed at [facility]   Fluid Type-FTP PLEURAL     Comment: CORRECTED ON 01/20 AT 1414: PREVIOUSLY REPORTED AS Body Fluid  LACTATE DEHYDROGENASE, BODY FLUID     Status: Abnormal   Collection Time    12/31/13  2:05 PM      Result Value Range   LD, Fluid 62 (*) 3 - 23 U/L   Comment: Performed at [facility]   Fluid Type-FLDH PLEURAL     Comment: CORRECTED ON 01/20 AT 1414: PREVIOUSLY REPORTED AS Body Fluid  ALBUMIN, FLUID     Status: None   Collection Time    12/31/13  2:05 PM      Result Value Range   Albumin, Fluid 1.3     Comment: NO NORMAL RANGE ESTABLISHED FOR THIS TEST     Performed at [facility]   Fluid Type-FALB PLEURAL     Comment: CORRECTED ON 01/20 AT 1413: PREVIOUSLY REPORTED AS Body Fluid  CULTURE, EXPECTORATED SPUTUM-ASSESSMENT     Status: None   Collection Time    12/31/13  5:59 PM      Result Value Range   Specimen Description SPUTUM  Special Requests  NONE     Sputum evaluation       Value: THIS SPECIMEN IS ACCEPTABLE. RESPIRATORY CULTURE REPORT TO FOLLOW.   Report Status 12/31/2013 FINAL    CULTURE, RESPIRATORY (NON-EXPECTORATED)     Status: None   Collection Time    12/31/13  5:59 PM      Result Value Range   Specimen Description SPUTUM     Special Requests NONE     Gram Stain       Value: NO WBC SEEN     NO SQUAMOUS EPITHELIAL CELLS SEEN     NO ORGANISMS SEEN     Performed at [facility]   Culture PENDING     Report Status PENDING    BASIC METABOLIC PANEL     Status: Abnormal   Collection Time    01/01/14  4:49 AM      Result Value Range   Sodium 141  137 - 147 mEq/L   Potassium 3.5 (*) 3.7 - 5.3 mEq/L   Chloride 105  96 - 112 mEq/L   CO2 21  19 - 32 mEq/L   Glucose, Bld 116 (*) 70 - 99 mg/dL   BUN 19  6 - 23 mg/dL   Creatinine, Ser 1.04  0.50 - 1.35 mg/dL   Calcium 8.7  8.4 - 10.5 mg/dL   GFR calc non Af Amer 66 (*) >90 mL/min   GFR calc Af Amer 77 (*) >90 mL/min   Comment: (NOTE)     The eGFR has been calculated using the CKD EPI equation.     This calculation has not been validated in all clinical situations.     eGFR's persistently <90 mL/min signify possible Chronic Kidney     Disease.  CBC     Status: Abnormal   Collection Time    01/01/14  4:49 AM      Result Value Range   WBC 8.2  4.0 - 10.5 K/uL   RBC 3.74 (*) 4.22 - 5.81 MIL/uL   Hemoglobin 11.8 (*) 13.0 - 17.0 g/dL   HCT 35.2 (*) 39.0 - 52.0 %   MCV 94.1  78.0 - 100.0 fL   MCH 31.6  26.0 - 34.0 pg   MCHC 33.5  30.0 - 36.0 g/dL   RDW 14.0  11.5 - 15.5 %   Platelets 308  150 - 400 K/uL  PRO B NATRIURETIC PEPTIDE     Status: Abnormal   Collection Time    01/01/14  4:49 AM      Result Value Range   Pro B Natriuretic peptide (BNP) 13190.0 (*) 0 - 450 pg/mL   Recent Results (from the past 240 hour(s))  CULTURE, BLOOD (ROUTINE X 2)     Status: None   Collection Time    12/30/13  7:55 PM      Result Value Range Status   Specimen  Description BLOOD LEFT FOREARM   Final   Special Requests BOTTLES DRAWN AEROBIC ONLY 3ML   Final   Culture  Setup Time     Final   Value: 12/31/2013 01:10     Performed at [facility]   Culture     Final   Value:        BLOOD CULTURE RECEIVED NO GROWTH TO DATE CULTURE WILL BE HELD FOR 5 DAYS BEFORE ISSUING A FINAL NEGATIVE REPORT     Performed at [facility]   Report Status PENDING   Incomplete  CULTURE, BLOOD (  ROUTINE X 2)     Status: None   Collection Time    12/30/13  8:00 PM      Result Value Range Status   Specimen Description BLOOD LEFT ARM   Final   Special Requests BOTTLES DRAWN AEROBIC AND ANAEROBIC 5ML   Final   Culture  Setup Time     Final   Value: 12/31/2013 01:10     Performed at [facility]   Culture     Final   Value:        BLOOD CULTURE RECEIVED NO GROWTH TO DATE CULTURE WILL BE HELD FOR 5 DAYS BEFORE ISSUING A FINAL NEGATIVE REPORT     Performed at [facility]   Report Status PENDING   Incomplete  CULTURE, BLOOD (ROUTINE X 2)     Status: None   Collection Time    12/30/13 10:10 PM      Result Value Range Status   Specimen Description BLOOD LEFT ARM   Final   Special Requests BOTTLES DRAWN AEROBIC ONLY 4CC   Final   Culture  Setup Time     Final   Value: 12/31/2013 01:10     Performed at [facility]   Culture     Final   Value:        BLOOD CULTURE RECEIVED NO GROWTH TO DATE CULTURE WILL BE HELD FOR 5 DAYS BEFORE ISSUING A FINAL NEGATIVE REPORT     Performed at [facility]   Report Status PENDING   Incomplete  CULTURE, BLOOD (ROUTINE X 2)     Status: None   Collection Time    12/30/13 10:15 PM      Result Value Range Status   Specimen Description BLOOD LEFT HAND   Final   Special Requests BOTTLES DRAWN AEROBIC ONLY 2CC   Final   Culture  Setup Time     Final   Value: 12/31/2013 01:10     Performed at [facility]   Culture     Final   Value:        BLOOD CULTURE RECEIVED NO GROWTH TO DATE  CULTURE WILL BE HELD FOR 5 DAYS BEFORE ISSUING A FINAL NEGATIVE REPORT     Performed at [facility]   Report Status PENDING   Incomplete  BODY FLUID CULTURE     Status: None   Collection Time    12/31/13  2:05 PM      Result Value Range Status   Specimen Description PLEURAL   Final   Special Requests NONE   Final   Gram Stain     Final   Value: NO WBC SEEN     NO ORGANISMS SEEN     Performed at [facility]   Culture PENDING   Incomplete   Report Status PENDING   Incomplete  CULTURE, EXPECTORATED SPUTUM-ASSESSMENT     Status: None   Collection Time    12/31/13  5:59 PM      Result Value Range Status   Specimen Description SPUTUM   Final   Special Requests NONE   Final   Sputum evaluation     Final   Value: THIS SPECIMEN IS ACCEPTABLE. RESPIRATORY CULTURE REPORT TO FOLLOW.   Report Status 12/31/2013 FINAL   Final  CULTURE, RESPIRATORY (NON-EXPECTORATED)     Status: None   Collection Time    12/31/13  5:59 PM      Result Value Range Status   Specimen  Description SPUTUM   Final   Special Requests NONE   Final   Gram Stain     Final   Value: NO WBC SEEN     NO SQUAMOUS EPITHELIAL CELLS SEEN     NO ORGANISMS SEEN     Performed at [facility]   Culture PENDING   Incomplete   Report Status PENDING   Incomplete   Creatinine:  Recent Labs  12/30/13 1722 12/31/13 0455 01/01/14 0449  CREATININE 1.17 1.13 1.04    Xrays:   Moderate left pleural effusion, stable since prior study. Left lower  lobe atelectasis or infiltrate. Diffuse opacities now project  throughout both lungs, likely mild edema. Heart is borderline in  size.  Advanced degenerative changes in the right shoulder. The previously  described left seventh rib lesion not visualized on this study.  IMPRESSION:  Moderate left pleural effusion with left lower lobe atelectasis or  infiltrate. Diffuse opacities throughout the lungs may reflect new  pulmonary edema.  Electronically Signed   By: Rolm Baptise M.D.  On: 12/30/2013 19:27   Impression/Assessment:    1. Prostate cancer, metastatic . Post radiation therapy to met site with pain resolution. Hornopne Rx via [facility] sub-cu Rx. Will ck psa today.  2. CHF/ Ao stenosis/ MR per Dr. Stanford Breed. On BID Lasis. Will place foley for I/o and convenience because of risk of fall significant. Pt does nto want condom cath.   Plan:  Foley while on bid lasix and until stronger and can get to bathroom to void, and perhaps on lower lasix dose. . Ck psa, T level.   Desa Rech I 01/01/2014, 8:41 AM

## 2014-01-01 NOTE — Progress Notes (Signed)
Rehab Admissions Coordinator Note:  Patient was screened by Cleatrice Burke for appropriateness for an Inpatient Acute Rehab Consult.  At this time, we are recommending Inpatient Rehab consult. I will contact Dr. Wendee Beavers.  Cleatrice Burke 01/01/2014, 5:35 PM  I can be reached at [phone].

## 2014-01-01 NOTE — Evaluation (Signed)
Physical Therapy Evaluation Patient Details Name: Lucas Green MRN: [id number] DOB: 1932-10-27 Today's Date: 01/01/2014 Time: 2122-4825 PT Time Calculation (min): 32 min  PT Assessment / Plan / Recommendation History of Present Illness  80 yo male admitted with weakness, pleural effusion, FTT. Hx of HTN, CVA, arthritis, prostate cancer with mets, GERD  Clinical Impression  On eval, pt required Min assist for mobility-able to stand pivot from bed to recliner. Demonstrates general weakness, decreased activity tolerance. Pt/family would like a CIR consult. If CIR is not an option, feel pt may need SNF for ST rehab unless he progresses significantly with mobility, strength, and activity tolerance.      PT Assessment  Patient needs continued PT services    Follow Up Recommendations  CIR;Supervision/Assistance - 24 hour    Does the patient have the potential to tolerate intense rehabilitation      Barriers to Discharge        Equipment Recommendations  None recommended by PT    Recommendations for Other Services Rehab consult;OT consult   Frequency Min 3X/week    Precautions / Restrictions Precautions Precautions: Fall Restrictions Weight Bearing Restrictions: No   Pertinent Vitals/Pain No c/o pain      Mobility  Bed Mobility Overal bed mobility: Needs Assistance Bed Mobility: Supine to Sit Supine to sit: Min guard;HOB elevated General bed mobility comments: Relied on handrails. close guard for safety.  Transfers Overall transfer level: Needs assistance Equipment used: Rolling walker (2 wheeled) Transfers: Sit to/from [facility] Sit to Stand: From elevated surface;Min assist Stand pivot transfers: Min assist General transfer comment: Assist to rise, stabilize, control descent. VCs safety, technique, hand placement. Stand pivot from bed to recliner with RW. Pt stated he felt fatigued so deferred ambulation    Exercises General Exercises - Lower  Extremity Ankle Circles/Pumps: AROM;Both;10 reps;Seated Long Arc Quad: AROM;Both;10 reps;Seated   PT Diagnosis: Difficulty walking;Generalized weakness  PT Problem List: Decreased strength;Decreased activity tolerance;Decreased mobility;Decreased balance;Decreased knowledge of use of DME PT Treatment Interventions: DME instruction;Gait training;Functional mobility training;Therapeutic activities;Therapeutic exercise;Patient/family education;Balance training     PT Goals(Current goals can be found in the care plan section) Acute Rehab PT Goals Patient Stated Goal: return home with [facility]. get stronger PT Goal Formulation: With patient/family Time For Goal Achievement: 01/15/14 Potential to Achieve Goals: Good  Visit Information  Last PT Received On: 01/01/14 Assistance Needed: +1 History of Present Illness: 80 yo male admitted with weakness, pleural effusion, FTT. Hx of HTN, CVA, arthritis, prostate cancer with mets, GERD       Prior Functioning  Home Living Family/patient expects to be discharged to:: Private residence Living Arrangements: Spouse/significant other Available Help at Discharge: Family (wife works M-F 8 hour days) Type of Home: House Home Access: Stairs to enter [employer] of Steps: 1 Home Layout: Multi-level;Able to live on main level with bedroom/bathroom Home Equipment: Gilford Rile - 2 wheels;Cane - single point;Shower seat Prior Function Level of Independence: Independent with assistive device(s) Comments: using cane for ambulation Communication Communication: No difficulties Dominant Hand: Right    Cognition  Cognition Arousal/Alertness: Awake/alert Behavior During Therapy: WFL for tasks assessed/performed Overall Cognitive Status: Within Functional Limits for tasks assessed    Extremity/Trunk Assessment Upper Extremity Assessment Upper Extremity Assessment: Generalized weakness Lower Extremity Assessment Lower Extremity Assessment: Generalized  weakness Cervical / Trunk Assessment Cervical / Trunk Assessment: Normal   Balance    End of Session PT - End of Session Equipment Utilized During Treatment: Gait belt Activity Tolerance: Patient  limited by fatigue Patient left: in chair;with call bell/phone within reach  GP     Weston Anna, MPT Pager: [phone]

## 2014-01-01 NOTE — Progress Notes (Signed)
ANTICOAGULATION CONSULT NOTE - Initial Consult  Pharmacy Consult for Heparin IV Indication: atrial fibrillation  Allergies  Allergen Reactions  . Other     Beer and Wine- swelling/headache/nausea    Patient Measurements: Height: 6\' 2"  (188 cm) Weight: 195 lb 12.3 oz (88.8 kg) IBW/kg (Calculated) : 82.2  Vital Signs: Temp: 97.6 F (36.4 C) (01/21 0511) Temp src: Oral (01/21 0511) BP: 122/69 mmHg (01/21 0511) Pulse Rate: 74 (01/21 0511)  Labs:  Recent Labs  12/30/13 1722 12/31/13 0455 01/01/14 0449  HGB 13.2 11.6* 11.8*  HCT 38.5* 33.8* 35.2*  PLT 325 327 308  CREATININE 1.17 1.13 1.04    Estimated Creatinine Clearance: 67 ml/min (by C-G formula based on Cr of 1.04).   Medical History: Past Medical History  Diagnosis Date  . Hypertension   . GERD (gastroesophageal reflux disease)   . Dyslipidemia   . Aortic stenosis   . Prostate cancer 09/2009  . 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 Hearing loss   . Incontinence of urine   . 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 Degenerative arthritis     osteoarthritis  . Stroke 03/29/13  . Hx of radiation therapy 09/30/13- 10/11/13    right hip/ischium, 3000 cGy 10 sessions  . Esophageal stricture   . Hiatal hernia   . Diverticulosis     Medications:  Scheduled:  . azithromycin  500 mg Oral Q24H  . cefTRIAXone (ROCEPHIN)  IV  1 g Intravenous Q24H  . feeding supplement (PRO-STAT SUGAR FREE 64)  30 mL Oral BID WC  . furosemide  40 mg Intravenous BID  . metoprolol succinate  50 mg Oral Daily  . multivitamin with minerals  1 tablet Oral Daily  . pantoprazole  40 mg Oral Daily  . potassium chloride  40 mEq Oral Once  . protein supplement  1 scoop Oral BID WC  . simvastatin  20 mg Oral q1800  . sodium chloride  3 mL Intravenous Q12H   Infusions:    Assessment: 78 yoM admitted 1/19 with weakness, metastatic prostate cancer, pleural effusion, CAP, aortic stenosis, and CHF.  S/p 2L thoracentesis on 1/20,  pleural effusion cytology is pending.  He was noted to have paroxysmal atrial fibrillation on telemetry.  Pharmacy is consulted to dose IV heparin on 01/01/14.  Baseline Coags: pending  SCr: 1.04 with CrCl ~ 67  CBC: Hgb 11.8, Plt 308  VTE prophylaxis with Lovenox 40mg  daily last given on 1/20 at ~2200  Goal of Therapy:  Heparin level 0.3-0.7 units/ml Monitor platelets by anticoagulation protocol: Yes   Plan:   Baseline PTT, PT/INR  Give heparin 4000 units bolus IV x 1  Start heparin IV infusion at 1250 units/hr  Heparin level 8 hours after starting  Daily heparin level and CBC  Continue to monitor H&H and platelets  F/U transition to warfarin for long-term anticoagulant therapy.   Gretta Arab PharmD, BCPS Pager [phone] 01/01/2014 8:13 AM

## 2014-01-01 NOTE — Progress Notes (Addendum)
[facility] PROGRESS NOTE  Lucas Green [id number] DOB: Mar 19, 1932 DOA: 12/30/2013 PCP: Teressa Lower, MD  Assessment/Plan: Weakness generalized, failure to thrive with moderate proteinuria malnutrition  - likely related to metastatic prostate cancer, bone mets and maybe even new lung mets as moderate pleural effusion seen on CXR  - needs PT/OT eval  - needs nutrition consult, protein supplementation  - supportive care with IV fluids, encourage PO intake   Pleural effusion, left  - likely malignant  - s/p thoracentesis, diagnostic and therapeutic, oxygen support via nasal canula to keep O2 saturation above 90%   CAP (community acquired pneumonia)  - order placed for azithromycin and rocephin  - follow up blood cultures, resp culture   Hypertension  - continue metoprolol   Prostate cancer with bone metastases  - under GU and Dr. Hazeline Junker care  - Dr. Alen Blew is of have consulted oncologist on call   Hypokalemia  - repleted currently stable will monitor   Nausea and vomiting, dehydration  - likely due to prostate cancer  - Currently improved addendum not currently on Zofran  Dyslipidemia  - continue statin therapy   Moderate protein-calorie malnutrition  - nutrition consulted   Asymmetric edema left leg more than right.  Check Venous duplex  Code Status: DNR Family Communication: None at bedside Disposition Plan: Pending improvement in condition.   Consultants:  None  Procedures:  US thoracentesis ASP Pleural space with IMG guide  Antibiotics:  Azithromycin  Rocephin  HPI/Subjective: Patient has no new complaints  Objective: Filed Vitals:   01/01/14 1338  BP: 115/66  Pulse: 74  Temp: 97.6 F (36.4 C)  Resp:     Intake/Output Summary (Last 24 hours) at 01/01/14 1830 Last data filed at 01/01/14 1601  Gross per 24 hour  Intake 768.13 ml  Output   3100 ml  Net -2331.87 ml   Filed Weights   12/30/13 2059 12/31/13 0439 01/01/14  0511  Weight: 91 kg (200 lb 9.9 oz) 91.1 kg (200 lb 13.4 oz) 88.8 kg (195 lb 12.3 oz)    Exam:   General:  Pt in NAD, Alert and awake  Cardiovascular: RRR,  Addendum: systolic murmur  Respiratory: no wheezes, breath sounds BL  Abdomen: soft, NT, ND  Musculoskeletal: no cyanosis or clubbing   Data Reviewed: Basic Metabolic Panel:  Recent Labs Lab 12/30/13 1722 12/31/13 0455 01/01/14 0449  NA 140 143 141  K 3.5* 3.5* 3.5*  CL 102 107 105  CO2 21 20 21   GLUCOSE 103* 99 116*  BUN 21 20 19   CREATININE 1.17 1.13 1.04  CALCIUM 9.2 8.8 8.7   Liver Function Tests: No results found for this basename: AST, ALT, ALKPHOS, BILITOT, PROT, ALBUMIN,  in the last 168 hours No results found for this basename: LIPASE, AMYLASE,  in the last 168 hours No results found for this basename: AMMONIA,  in the last 168 hours CBC:  Recent Labs Lab 12/30/13 1722 12/31/13 0455 01/01/14 0449  WBC 7.8 7.5 8.2  HGB 13.2 11.6* 11.8*  HCT 38.5* 33.8* 35.2*  MCV 92.3 93.1 94.1  PLT 325 327 308   Cardiac Enzymes: No results found for this basename: CKTOTAL, CKMB, CKMBINDEX, TROPONINI,  in the last 168 hours BNP (last 3 results)  Recent Labs  12/30/13 2210 01/01/14 0449  PROBNP 15574.0* 13190.0*   CBG:  Recent Labs Lab 12/31/13 0738  GLUCAP 105*    Recent Results (from the past 240 hour(s))  CULTURE, BLOOD (ROUTINE X 2)  Status: None   Collection Time    12/30/13  7:55 PM      Result Value Range Status   Specimen Description BLOOD LEFT FOREARM   Final   Special Requests BOTTLES DRAWN AEROBIC ONLY 3ML   Final   Culture  Setup Time     Final   Value: 12/31/2013 01:10     Performed at [facility]   Culture     Final   Value:        BLOOD CULTURE RECEIVED NO GROWTH TO DATE CULTURE WILL BE HELD FOR 5 DAYS BEFORE ISSUING A FINAL NEGATIVE REPORT     Performed at [facility]   Report Status PENDING   Incomplete  CULTURE, BLOOD (ROUTINE X 2)     Status: None    Collection Time    12/30/13  8:00 PM      Result Value Range Status   Specimen Description BLOOD LEFT ARM   Final   Special Requests BOTTLES DRAWN AEROBIC AND ANAEROBIC 5ML   Final   Culture  Setup Time     Final   Value: 12/31/2013 01:10     Performed at [facility]   Culture     Final   Value:        BLOOD CULTURE RECEIVED NO GROWTH TO DATE CULTURE WILL BE HELD FOR 5 DAYS BEFORE ISSUING A FINAL NEGATIVE REPORT     Performed at [facility]   Report Status PENDING   Incomplete  CULTURE, BLOOD (ROUTINE X 2)     Status: None   Collection Time    12/30/13 10:10 PM      Result Value Range Status   Specimen Description BLOOD LEFT ARM   Final   Special Requests BOTTLES DRAWN AEROBIC ONLY 4CC   Final   Culture  Setup Time     Final   Value: 12/31/2013 01:10     Performed at [facility]   Culture     Final   Value:        BLOOD CULTURE RECEIVED NO GROWTH TO DATE CULTURE WILL BE HELD FOR 5 DAYS BEFORE ISSUING A FINAL NEGATIVE REPORT     Performed at [facility]   Report Status PENDING   Incomplete  CULTURE, BLOOD (ROUTINE X 2)     Status: None   Collection Time    12/30/13 10:15 PM      Result Value Range Status   Specimen Description BLOOD LEFT HAND   Final   Special Requests BOTTLES DRAWN AEROBIC ONLY 2CC   Final   Culture  Setup Time     Final   Value: 12/31/2013 01:10     Performed at [facility]   Culture     Final   Value:        BLOOD CULTURE RECEIVED NO GROWTH TO DATE CULTURE WILL BE HELD FOR 5 DAYS BEFORE ISSUING A FINAL NEGATIVE REPORT     Performed at [facility]   Report Status PENDING   Incomplete  BODY FLUID CULTURE     Status: None   Collection Time    12/31/13  2:05 PM      Result Value Range Status   Specimen Description PLEURAL   Final   Special Requests NONE   Final   Gram Stain     Final   Value: NO WBC SEEN     NO ORGANISMS SEEN     Performed at  [facility]   Culture PENDING   Incomplete    Report Status PENDING   Incomplete  CULTURE, EXPECTORATED SPUTUM-ASSESSMENT     Status: None   Collection Time    12/31/13  5:59 PM      Result Value Range Status   Specimen Description SPUTUM   Final   Special Requests NONE   Final   Sputum evaluation     Final   Value: THIS SPECIMEN IS ACCEPTABLE. RESPIRATORY CULTURE REPORT TO FOLLOW.   Report Status 12/31/2013 FINAL   Final  CULTURE, RESPIRATORY (NON-EXPECTORATED)     Status: None   Collection Time    12/31/13  5:59 PM      Result Value Range Status   Specimen Description SPUTUM   Final   Special Requests NONE   Final   Gram Stain     Final   Value: NO WBC SEEN     NO SQUAMOUS EPITHELIAL CELLS SEEN     NO ORGANISMS SEEN     Performed at [facility]   Culture PENDING   Incomplete   Report Status PENDING   Incomplete     Studies: Dg Chest 1 View  12/31/2013   CLINICAL DATA:  Post left thoracentesis  EXAM: CHEST - 1 VIEW  COMPARISON:  12/1913  FINDINGS: There is significant decreased opacity on the left reflecting evacuation of most of the left pleural effusion. There is no pneumothorax. Opacity at the left lung base may reflect infiltrate or atelectasis.  There is vascular congestion centrally throughout the right lung with mild diffuse hazy opacity similar to the prior study.  IMPRESSION: No pneumothorax following thoracentesis on the left. Marked reduction in left pleural effusion.   Electronically Signed   By: Lajean Manes M.D.   On: 12/31/2013 14:32   Dg Chest 2 View  12/30/2013   CLINICAL DATA:  Weakness, shortness of breath.  EXAM: CHEST  2 VIEW  COMPARISON:  12/10/2013  FINDINGS: Moderate left pleural effusion, stable since prior study. Left lower lobe atelectasis or infiltrate. Diffuse opacities now project throughout both lungs, likely mild edema. Heart is borderline in size.  Advanced degenerative changes in the right shoulder. The previously described left seventh rib lesion not visualized on this study.   IMPRESSION: Moderate left pleural effusion with left lower lobe atelectasis or infiltrate. Diffuse opacities throughout the lungs may reflect new pulmonary edema.   Electronically Signed   By: Rolm Baptise M.D.   On: 12/30/2013 19:27   US Thoracentesis Asp Pleural Space W/img Guide  12/31/2013   CLINICAL DATA:  Shortness of breath, left-sided pleural effusion. Request diagnostic and therapeutic thoracentesis.  EXAM: ULTRASOUND GUIDED left THORACENTESIS  COMPARISON:  None.  FINDINGS: A total of approximately 2 L of clear yellow fluid was removed. A fluid sample wassent for laboratory analysis.  IMPRESSION: Successful ultrasound guided left thoracentesis yielding 2 L of pleural fluid.  Read by: Ascencion Dike PA-C  PROCEDURE: An ultrasound guided thoracentesis was thoroughly discussed with the patient and questions answered. The benefits, risks, alternatives and complications were also discussed. The patient understands and wishes to proceed with the procedure. Written consent was obtained.  Ultrasound was performed to localize and mark an adequate pocket of fluid in the left chest. The area was then prepped and draped in the normal sterile fashion. 1% Lidocaine was used for local anesthesia. Under ultrasound guidance a 19 gauge Yueh catheter was introduced. Thoracentesis was performed. The catheter was removed and a dressing applied.  Complications:  None immediate   Electronically Signed   By: Maryclare Bean M.D.   On: 12/31/2013 14:23    Scheduled Meds: . azithromycin  500 mg Oral Q24H  . cefTRIAXone (ROCEPHIN)  IV  1 g Intravenous Q24H  . feeding supplement (PRO-STAT SUGAR FREE 64)  30 mL Oral BID WC  . furosemide  40 mg Intravenous BID  . heparin  4,000 Units Intravenous Once  . metoprolol succinate  50 mg Oral Daily  . multivitamin with minerals  1 tablet Oral Daily  . pantoprazole  40 mg Oral Daily  . protein supplement  1 scoop Oral BID WC  . simvastatin  20 mg Oral q1800  . sodium chloride  3 mL  Intravenous Q12H   Continuous Infusions: . heparin 1,250 Units/hr (01/01/14 5681)    Principal Problem:   Weakness Active Problems:   Hypertension   Prostate cancer   Metastasis from malignant tumor of prostate   Bone metastases   Pleural effusion, left   Hypokalemia   CAP (community acquired pneumonia)   Nausea and vomiting   Dyslipidemia   Failure to thrive   Moderate protein-calorie malnutrition   Aortic valve disorders    Time spent: > 35 minutes    Velvet Bathe  [facility] Pager [phone]. If 7PM-7AM, please contact night-coverage at [email or web address], password [facility] 01/01/2014, 6:30 PM  LOS: 2 days

## 2014-01-01 NOTE — Consult Note (Signed)
HPI: 80 year old Green for evaluation of aortic stenosis and congestive heart failure. Patient states he was told in the past that he had a bicuspid valve. However he has not been followed by cardiology. Echocardiogram in the setting of a stroke in April of 2014 showed normal LV function, grade 1 diastolic dysfunction, severe aortic stenosis with a mean gradient of 34 mm of mercury and mild aortic insufficiency. There was mild mitral regurgitation. Patient also has metastatic prostate cancer. He has been admitted with progressive weakness and recurrent falls. He also has dyspnea on exertion and pedal edema. There is no chest pain. He denies palpitations. Cardiology is asked to evaluate.  Medications Prior to Admission  Medication Sig Dispense Refill  . clopidogrel (PLAVIX) 75 MG tablet Take 1 tablet (75 mg total) by mouth daily with breakfast.  30 tablet  1  . ibuprofen (ADVIL,MOTRIN) 800 MG tablet Take 800 mg by mouth every 8 (eight) hours as needed for pain.      . metoprolol succinate (TOPROL-XL) 50 MG 24 hr tablet Take 25 mg by mouth daily. Take with or immediately following a meal.      . Multiple Vitamin (MULTIVITAMIN WITH MINERALS) TABS Take 1 tablet by mouth daily.      . pantoprazole (PROTONIX) 40 MG tablet Take 40 mg by mouth daily.      . simvastatin (ZOCOR) 20 MG tablet Take 1 tablet (20 mg total) by mouth daily at 6 PM.  30 tablet  1  . Histrelin Acetate (VANTAS Oak[place]) Inject into the skin.        Allergies  Allergen Reactions  . Other     Beer and Wine- swelling/headache/nausea    Past Medical History  Diagnosis Date  . Hypertension   . GERD (gastroesophageal reflux disease)   . Dyslipidemia   . Aortic stenosis   . Prostate cancer 09/2009  . 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 Hearing loss   . Incontinence of urine   . Green impotence   . Degenerative arthritis     osteoarthritis  . Stroke 03/29/13  . Hx of radiation therapy 09/30/13-  10/11/13    right hip/ischium, 3000 cGy 10 sessions  . Esophageal stricture   . Hiatal hernia   . Diverticulosis     Past Surgical History  Procedure Laterality Date  . Tonsillectomy  1938  . Cryosurgery prostate  09/2009    prostate cancer, Gleason 7    History   Social History  . Marital Status: Married    Spouse Name: N/A    Number of Children: 1  . Years of Education: MD   Occupational History  . Not on file.   Social History Main Topics  . Smoking status: Never Smoker   . Smokeless tobacco: Never Used  . Alcohol Use: Yes     Comment: occassionally  . Drug Use: No  . Sexual Activity: Not on file   Other Topics Concern  . Not on file   Social History Narrative  . No narrative on file    Family History  Problem Relation Age of Onset  . Heart disease Father   . Cancer Father     prostate  . Heart attack Father   . Cancer - Colon Mother   . Thyroid disease Mother   . Stroke Maternal Grandfather   . Hypertension Maternal Grandfather   . Stroke Paternal Grandfather   . Hypertension Paternal Grandfather   .  Cancer - Lung Cousin     ROS:  Weakness, frequent falls, productive cough but no fevers or chills, hemoptysis, dysphasia, odynophagia, melena, hematochezia, dysuria, hematuria, rash, seizure activity, orthopnea, PND, claudication. Remaining systems are negative.  Physical Exam:   Blood pressure 122/Lucas, pulse 74, temperature 97.6 F (36.4 C), temperature source Oral, resp. rate 20, height _0  (1.88 m), weight 195 lb 12.3 oz (88.8 kg), SpO2 100.00%.  General:  Well developed/chronically ill apppearing in NAD Skin warm/dry Patient not depressed No peripheral clubbing Back-normal HEENT-normal/normal eyelids Neck supple/normal carotid upstroke bilaterally; no JVD; no thyromegaly chest - diminished breath sounds at bases bilaterally CV - RRR/normal S1; no rubs or gallops;  PMI nondisplaced, 3/6 systolic murmur left sternal border. S2 is diminished.  Murmur radiates to the carotids. Abdomen -NT/ND, no HSM, no mass, + bowel sounds, no bruit 2+ femoral pulses, no bruits Ext-1+ edema, chords, 2+ DP Neuro-grossly nonfocal  ECG sinus rhythm, left atrial enlargement, cannot rule out prior septal infarct, nonspecific ST changes, prolonged QT interval.  Results for orders placed during the hospital encounter of 12/30/13 (from the past 48 hour(s))  CBC     Status: Abnormal   Collection Time    12/30/13  5:22 PM      Result Value Range   WBC 7.8  4.0 - 10.5 K/uL   RBC 4.17 (*) 4.22 - 5.81 MIL/uL   Hemoglobin 13.2  13.0 - 17.0 g/dL   HCT 38.5 (*) 39.0 - 52.0 %   MCV 92.3  78.0 - 100.0 fL   MCH 31.7  26.0 - 34.0 pg   MCHC 34.3  30.0 - 36.0 g/dL   RDW 13.8  11.5 - 15.5 %   Platelets 325  150 - 400 K/uL  BASIC METABOLIC PANEL     Status: Abnormal   Collection Time    12/30/13  5:22 PM      Result Value Range   Sodium 140  137 - 147 mEq/L   Potassium 3.5 (*) 3.7 - 5.3 mEq/L   Chloride 102  96 - 112 mEq/L   CO2 21  19 - 32 mEq/L   Glucose, Bld 103 (*) 70 - 99 mg/dL   BUN 21  6 - 23 mg/dL   Creatinine, Ser 1.17  0.50 - 1.35 mg/dL   Calcium 9.2  8.4 - 10.5 mg/dL   GFR calc non Af Amer 57 (*) >90 mL/min   GFR calc Af Amer 67 (*) >90 mL/min   Comment: (NOTE)     The eGFR has been calculated using the CKD EPI equation.     This calculation has not been validated in all clinical situations.     eGFR's persistently <90 mL/min signify possible Chronic Kidney     Disease.  CG4 I-STAT (LACTIC ACID)     Status: None   Collection Time    12/30/13  5:39 PM      Result Value Range   Lactic Acid, Venous 1.66  0.5 - 2.2 mmol/L  PRO B NATRIURETIC PEPTIDE     Status: Abnormal   Collection Time    12/30/13 10:10 PM      Result Value Range   Pro B Natriuretic peptide (BNP) 15574.0 (*) 0 - 450 pg/mL  INFLUENZA PANEL BY PCR (TYPE A & B, H1N1)     Status: None   Collection Time    12/30/13 11:25 PM      Result Value Range   Influenza A By PCR  NEGATIVE  NEGATIVE   Influenza B By PCR NEGATIVE  NEGATIVE   H1N1 flu by pcr NOT DETECTED  NOT DETECTED   Comment:            The Xpert Flu assay (FDA approved for     nasal aspirates or washes and     nasopharyngeal swab specimens), is     intended as an aid in the diagnosis of     influenza and should not be used as     a sole basis for treatment.     Performed at [facility]CROS[facility]     Status: Abnormal   Collection Time    12/31/13  1:52 AM      Result Value Range   Color, Urine AMBER (*) YELLOW   Comment: BIOCHEMICALS MAY BE AFFECTED BY COLOR   APPearance CLOUDY (*) CLEAR   Specific Gravity, Urine 1.030  1.005 - 1.030   pH 5.5  5.0 - 8.0   Glucose, UA NEGATIVE  NEGATIVE mg/dL   Hgb urine dipstick NEGATIVE  NEGATIVE   Bilirubin Urine SMALL (*) NEGATIVE   Ketones, ur NEGATIVE  NEGATIVE mg/dL   Protein, ur 30 (*) NEGATIVE mg/dL   Urobilinogen, UA 0.2  0.0 - 1.0 mg/dL   Nitrite NEGATIVE  NEGATIVE   Leukocytes, UA NEGATIVE  NEGATIVE  LEGIONELLA ANTIGEN, URINE     Status: None   Collection Time    12/31/13  1:52 AM      Result Value Range   Specimen Description URINE, RANDOM     Special Requests NONE     Legionella Antigen, Urine       Value: Negative for Legionella pneumophilia serogroup 1     Performed at [facility]   Report Status 12/31/2013 FINAL    STREP PNEUMONIAE URINARY ANTIGEN     Status: None   Collection Time    12/31/13  1:52 AM      Result Value Range   Strep Pneumo Urinary Antigen NEGATIVE  NEGATIVE   Comment: PERFORMED AT [facility]                Infection due to S. pneumoniae     cannot be absolutely ruled out     since the antigen present     may be below the detection limit     of the test.     Performed at [place] [facility]     Status: Abnormal   Collection Time    12/31/13  1:52 AM      Result Value Range   Squamous Epithelial / LPF RARE  RARE   WBC, UA  3-6  <3 WBC/hpf   Bacteria, UA RARE  RARE   Crystals URIC ACID CRYSTALS (*) NEGATIVE  BASIC METABOLIC PANEL     Status: Abnormal   Collection Time    12/31/13  4:55 AM      Result Value Range   Sodium 143  137 - 147 mEq/L   Potassium 3.5 (*) 3.7 - 5.3 mEq/L   Chloride 107  96 - 112 mEq/L   CO2 20  19 - 32 mEq/L   Glucose, Bld 99  70 - 99 mg/dL   BUN 20  6 - 23 mg/dL   Creatinine, Ser 1.13  0.50 - 1.35 mg/dL   Calcium 8.8  8.4 - 10.5 mg/dL   GFR calc non Af Amer 60 (*) >90 mL/min  GFR calc Af Amer Lucas (*) >90 mL/min   Comment: (NOTE)     The eGFR has been calculated using the CKD EPI equation.     This calculation has not been validated in all clinical situations.     eGFR's persistently <90 mL/min signify possible Chronic Kidney     Disease.  CBC     Status: Abnormal   Collection Time    12/31/13  4:55 AM      Result Value Range   WBC 7.5  4.0 - 10.5 K/uL   Comment: WHITE COUNT CONFIRMED ON SMEAR   RBC 3.63 (*) 4.22 - 5.81 MIL/uL   Hemoglobin 11.6 (*) 13.0 - 17.0 g/dL   HCT 33.8 (*) 39.0 - 52.0 %   MCV 93.1  78.0 - 100.0 fL   MCH 32.0  26.0 - 34.0 pg   MCHC 34.3  30.0 - 36.0 g/dL   RDW 14.0  11.5 - 15.5 %   Platelets 327  150 - 400 K/uL  GLUCOSE, CAPILLARY     Status: Abnormal   Collection Time    12/31/13  7:38 AM      Result Value Range   Glucose-Capillary 105 (*) 70 - 99 mg/dL  BODY FLUID CELL COUNT WITH DIFFERENTIAL     Status: None   Collection Time    12/31/13  2:05 PM      Result Value Range   Fluid Type-FCT PLEURAL     Comment: CORRECTED ON 01/20 AT 1413: PREVIOUSLY REPORTED AS Body Fluid   Color, Fluid YELLOW  YELLOW   Appearance, Fluid CLEAR  CLEAR   WBC, Fluid 187  0 - 1000 cu mm   Neutrophil Count, Fluid 13  0 - 25 %   Lymphs, Fluid 18     Monocyte-Macrophage-Serous Fluid Lucas  50 - 90 %   Other Cells, Fluid FEW LINING CELLS SEEN, SEE CYTOLOGY REPORT    BODY FLUID CULTURE     Status: None   Collection Time    12/31/13  2:05 PM      Result Value Range    Specimen Description PLEURAL     Special Requests NONE     Gram Stain       Value: NO WBC SEEN     NO ORGANISMS SEEN     Performed at [facility]   Culture PENDING     Report Status PENDING    PROTEIN, BODY FLUID     Status: None   Collection Time    12/31/13  2:05 PM      Result Value Range   Total protein, fluid 1.8     Comment: NO NORMAL RANGE ESTABLISHED FOR THIS TEST     Performed at [facility]   Fluid Type-FTP PLEURAL     Comment: CORRECTED ON 01/20 AT 1414: PREVIOUSLY REPORTED AS Body Fluid  LACTATE DEHYDROGENASE, BODY FLUID     Status: Abnormal   Collection Time    12/31/13  2:05 PM      Result Value Range   LD, Fluid 62 (*) 3 - 23 U/L   Comment: Performed at [facility]   Fluid Type-FLDH PLEURAL     Comment: CORRECTED ON 01/20 AT 1414: PREVIOUSLY REPORTED AS Body Fluid  ALBUMIN, FLUID     Status: None   Collection Time    12/31/13  2:05 PM      Result Value Range   Albumin, Fluid 1.3     Comment: NO NORMAL  RANGE ESTABLISHED FOR THIS TEST     Performed at [facility]   Fluid Type-FALB PLEURAL     Comment: CORRECTED ON 01/20 AT 1413: PREVIOUSLY REPORTED AS Body Fluid  CULTURE, EXPECTORATED SPUTUM-ASSESSMENT     Status: None   Collection Time    12/31/13  5:59 PM      Result Value Range   Specimen Description SPUTUM     Special Requests NONE     Sputum evaluation       Value: THIS SPECIMEN IS ACCEPTABLE. RESPIRATORY CULTURE REPORT TO FOLLOW.   Report Status 12/31/2013 FINAL    CULTURE, RESPIRATORY (NON-EXPECTORATED)     Status: None   Collection Time    12/31/13  5:59 PM      Result Value Range   Specimen Description SPUTUM     Special Requests NONE     Gram Stain       Value: NO WBC SEEN     NO SQUAMOUS EPITHELIAL CELLS SEEN     NO ORGANISMS SEEN     Performed at [facility]   Culture PENDING     Report Status PENDING    BASIC METABOLIC PANEL     Status: Abnormal   Collection Time    01/01/14  4:49 AM       Result Value Range   Sodium 141  137 - 147 mEq/L   Potassium 3.5 (*) 3.7 - 5.3 mEq/L   Chloride 105  96 - 112 mEq/L   CO2 21  19 - 32 mEq/L   Glucose, Bld 116 (*) 70 - 99 mg/dL   BUN 19  6 - 23 mg/dL   Creatinine, Ser 1.04  0.50 - 1.35 mg/dL   Calcium 8.7  8.4 - 10.5 mg/dL   GFR calc non Af Amer 66 (*) >90 mL/min   GFR calc Af Amer 77 (*) >90 mL/min   Comment: (NOTE)     The eGFR has been calculated using the CKD EPI equation.     This calculation has not been validated in all clinical situations.     eGFR's persistently <90 mL/min signify possible Chronic Kidney     Disease.  CBC     Status: Abnormal   Collection Time    01/01/14  4:49 AM      Result Value Range   WBC 8.2  4.0 - 10.5 K/uL   RBC 3.74 (*) 4.22 - 5.81 MIL/uL   Hemoglobin 11.8 (*) 13.0 - 17.0 g/dL   HCT 35.2 (*) 39.0 - 52.0 %   MCV 94.1  78.0 - 100.0 fL   MCH 31.6  26.0 - 34.0 pg   MCHC 33.5  30.0 - 36.0 g/dL   RDW 14.0  11.5 - 15.5 %   Platelets 308  150 - 400 K/uL  PRO B NATRIURETIC PEPTIDE     Status: Abnormal   Collection Time    01/01/14  4:49 AM      Result Value Range   Pro B Natriuretic peptide (BNP) 13190.0 (*) 0 - 450 pg/mL    Dg Chest 1 View  12/31/2013   CLINICAL DATA:  Post left thoracentesis  EXAM: CHEST - 1 VIEW  COMPARISON:  12/1913  FINDINGS: There is significant decreased opacity on the left reflecting evacuation of most of the left pleural effusion. There is no pneumothorax. Opacity at the left lung base may reflect infiltrate or atelectasis.  There is vascular congestion centrally throughout the right lung with mild diffuse hazy  opacity similar to the prior study.  IMPRESSION: No pneumothorax following thoracentesis on the left. Marked reduction in left pleural effusion.   Electronically Signed   By: Lajean Manes M.D.   On: 12/31/2013 14:32   Dg Chest 2 View  12/30/2013   CLINICAL DATA:  Weakness, shortness of breath.  EXAM: CHEST  2 VIEW  COMPARISON:  12/10/2013  FINDINGS: Moderate left  pleural effusion, stable since prior study. Left lower lobe atelectasis or infiltrate. Diffuse opacities now project throughout both lungs, likely mild edema. Heart is borderline in size.  Advanced degenerative changes in the right shoulder. The previously described left seventh rib lesion not visualized on this study.  IMPRESSION: Moderate left pleural effusion with left lower lobe atelectasis or infiltrate. Diffuse opacities throughout the lungs may reflect new pulmonary edema.   Electronically Signed   By: Rolm Baptise M.D.   On: 12/30/2013 19:27   US Thoracentesis Asp Pleural Space W/img Guide  12/31/2013   CLINICAL DATA:  Shortness of breath, left-sided pleural effusion. Request diagnostic and therapeutic thoracentesis.  EXAM: ULTRASOUND GUIDED left THORACENTESIS  COMPARISON:  None.  FINDINGS: A total of approximately 2 L of clear yellow fluid was removed. A fluid sample wassent for laboratory analysis.  IMPRESSION: Successful ultrasound guided left thoracentesis yielding 2 L of pleural fluid.  Read by: Ascencion Dike PA-C  PROCEDURE: An ultrasound guided thoracentesis was thoroughly discussed with the patient and questions answered. The benefits, risks, alternatives and complications were also discussed. The patient understands and wishes to proceed with the procedure. Written consent was obtained.  Ultrasound was performed to localize and mark an adequate pocket of fluid in the left chest. The area was then prepped and draped in the normal sterile fashion. 1% Lidocaine was used for local anesthesia. Under ultrasound guidance a 19 gauge Yueh catheter was introduced. Thoracentesis was performed. The catheter was removed and a dressing applied.  Complications:  None immediate   Electronically Signed   By: Maryclare Bean M.D.   On: 12/31/2013 14:23    Assessment/Plan 1 acute diastolic congestive heart failure-the patient appears to be volume overloaded on examination. This may be related to progressive aortic  stenosis. There may be a component of poor nutrition/low oncotic pressure. I will begin Lasix 40 mg twice a day and follow renal function. Check albumin. 2 aortic stenosis-the patient has severe aortic stenosis on examination. Will repeat echocardiogram. Not clear that he would be a candidate for further procedures given metastatic prostate cancer. Would need input from oncology concerning long-term prognosis. He is very frail at this point. Question candidate for TAVR. 3 paroxysmal atrial fibrillation-the patient is noted to have paroxysmal atrial fibrillation on telemetry. Increase Toprol to 50 mg daily. He has multiple embolic risk factors including prior stroke, age greater than 12, congestive heart failure, hypertension. I will begin heparin for now. We will transition to Coumadin prior to discharge. There is risk with anticoagulation as he has fallen recently. At present I feel benefit outweighs risk. Check TSH. Patient would not be a candidate for NOAC given valve or heart disease. 4 metastatic prostate cancer-this appears to be his predominant problem at present. We need input from oncology concerning long-term prognosis and how aggressive to be. Patient had thoracentesis yesterday for pleural effusion. Await cytology.  [name] MD 01/01/2014, 7:56 AM

## 2014-01-01 NOTE — Progress Notes (Signed)
VASCULAR LAB PRELIMINARY  PRELIMINARY  PRELIMINARY  PRELIMINARY  Bilateral lower extremity venous duplex  completed.    Preliminary report:  Bilateral:  No evidence of DVT, superficial thrombosis, or Baker's Cyst.    Kallon Caylor, RVT 01/01/2014, 9:06 AM

## 2014-01-01 NOTE — Progress Notes (Signed)
Echocardiogram 2D Echocardiogram has been performed.  Lucas Green 01/01/2014, 10:11 AM

## 2014-01-02 DIAGNOSIS — I509 Heart failure, unspecified: Secondary | ICD-10-CM

## 2014-01-02 LAB — BASIC METABOLIC PANEL
BUN: 20 mg/dL (ref 6–23)
CHLORIDE: 103 meq/L (ref 96–112)
CO2: 25 meq/L (ref 19–32)
CREATININE: 1.11 mg/dL (ref 0.50–1.35)
Calcium: 8.4 mg/dL (ref 8.4–10.5)
GFR calc non Af Amer: 61 mL/min — ABNORMAL LOW (ref >90)
GFR, EST AFRICAN AMERICAN: 71 mL/min — AB (ref >90)
Glucose, Bld: 117 mg/dL — ABNORMAL HIGH (ref 70–99)
POTASSIUM: 3.3 meq/L — AB (ref 3.7–5.3)
SODIUM: 141 meq/L (ref 137–147)

## 2014-01-02 LAB — CBC
HEMATOCRIT: 36.6 % — AB (ref 39.0–52.0)
HEMOGLOBIN: 12.1 g/dL — AB (ref 13.0–17.0)
MCH: 31.1 pg (ref 26.0–34.0)
MCHC: 33.1 g/dL (ref 30.0–36.0)
MCV: 94.1 fL (ref 78.0–100.0)
Platelets: 287 10*3/uL (ref 150–400)
RBC: 3.89 MIL/uL — AB (ref 4.22–5.81)
RDW: 13.8 % (ref 11.5–15.5)
WBC: 8.8 10*3/uL (ref 4.0–10.5)

## 2014-01-02 LAB — HEPARIN LEVEL (UNFRACTIONATED)
Heparin Unfractionated: 0.41 IU/mL (ref 0.30–0.70)
Heparin Unfractionated: 0.52 IU/mL (ref 0.30–0.70)
Heparin Unfractionated: 0.56 IU/mL (ref 0.30–0.70)

## 2014-01-02 MED ORDER — POTASSIUM CHLORIDE CRYS ER 20 MEQ PO TBCR
20.0000 meq | EXTENDED_RELEASE_TABLET | Freq: Two times a day (BID) | ORAL | Status: DC
  Administered 2014-01-02 – 2014-01-12 (×22): 20 meq via ORAL
  Filled 2014-01-02 (×26): qty 1

## 2014-01-02 NOTE — Progress Notes (Signed)
Subjective: The patient reports weakness. Has not gotten out of bed. Lasix continues. Foley cath continues. PSA was 24 prior to Vantas, but still elevated at 22 now. T level suppressed.   Objective: Vital signs in last 24 hours: Temp:  [97.5 F (36.4 C)-98.4 F (36.9 C)] 98.4 F (36.9 C) (01/22 0519) Pulse Rate:  [68-74] 68 (01/22 0519) Resp:  [18-20] 18 (01/22 0519) BP: (104-115)/(60-66) 111/60 mmHg (01/22 0519) SpO2:  [100 %] 100 % (01/22 0519) Weight:  [85.5 kg (188 lb 7.9 oz)] 85.5 kg (188 lb 7.9 oz) (01/22 0519)A  Intake/Output from previous day: 01/21 0701 - 01/22 0700 In: 598.1 [P.O.:240; I.V.:258.1; IV Piggyback:100] Out: 5700 [Urine:5700] Intake/Output this shift:    Past Medical History  Diagnosis Date  . Hypertension   . GERD (gastroesophageal reflux disease)   . Dyslipidemia   . Aortic stenosis   . Prostate cancer 09/2009  . 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 Hearing loss   . Incontinence of urine   . 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 Degenerative arthritis     osteoarthritis  . Stroke 03/29/13  . Hx of radiation therapy 09/30/13- 10/11/13    right hip/ischium, 3000 cGy 10 sessions  . Esophageal stricture   . Hiatal hernia   . Diverticulosis     Physical Exam:  Lungs - Normal respiratory effort, chest expands symmetrically.  Abdomen - Soft, non-tender & non-distended.  Lab Results:  Recent Labs  12/31/13 0455 01/01/14 0449 01/02/14 0507  WBC 7.5 8.2 8.8  HGB 11.6* 11.8* 12.1*  HCT 33.8* 35.2* 36.6*   BMET  Recent Labs  01/01/14 0449 01/02/14 0507  NA 141 141  K 3.5* 3.3*  CL 105 103  CO2 21 25  GLUCOSE 116* 117*  BUN 19 20  CREATININE 1.04 1.11  CALCIUM 8.7 8.4   No results found for this basename: LABURIN,  in the last 72 hours Results for orders placed during the hospital encounter of 12/30/13  CULTURE, BLOOD (ROUTINE X 2)     Status: None   Collection Time    12/30/13  7:55 PM      Result Value Range Status    Specimen Description BLOOD LEFT FOREARM   Final   Special Requests BOTTLES DRAWN AEROBIC ONLY 3ML   Final   Culture  Setup Time     Final   Value: 12/31/2013 01:10     Performed at [facility]   Culture     Final   Value:        BLOOD CULTURE RECEIVED NO GROWTH TO DATE CULTURE WILL BE HELD FOR 5 DAYS BEFORE ISSUING A FINAL NEGATIVE REPORT     Performed at [facility]   Report Status PENDING   Incomplete  CULTURE, BLOOD (ROUTINE X 2)     Status: None   Collection Time    12/30/13  8:00 PM      Result Value Range Status   Specimen Description BLOOD LEFT ARM   Final   Special Requests BOTTLES DRAWN AEROBIC AND ANAEROBIC 5ML   Final   Culture  Setup Time     Final   Value: 12/31/2013 01:10     Performed at [facility]   Culture     Final   Value:        BLOOD CULTURE RECEIVED NO GROWTH TO DATE CULTURE WILL BE HELD FOR 5 DAYS BEFORE ISSUING A FINAL NEGATIVE REPORT  Performed at [facility]   Report Status PENDING   Incomplete  CULTURE, BLOOD (ROUTINE X 2)     Status: None   Collection Time    12/30/13 10:10 PM      Result Value Range Status   Specimen Description BLOOD LEFT ARM   Final   Special Requests BOTTLES DRAWN AEROBIC ONLY 4CC   Final   Culture  Setup Time     Final   Value: 12/31/2013 01:10     Performed at [facility]   Culture     Final   Value:        BLOOD CULTURE RECEIVED NO GROWTH TO DATE CULTURE WILL BE HELD FOR 5 DAYS BEFORE ISSUING A FINAL NEGATIVE REPORT     Performed at [facility]   Report Status PENDING   Incomplete  CULTURE, BLOOD (ROUTINE X 2)     Status: None   Collection Time    12/30/13 10:15 PM      Result Value Range Status   Specimen Description BLOOD LEFT HAND   Final   Special Requests BOTTLES DRAWN AEROBIC ONLY 2CC   Final   Culture  Setup Time     Final   Value: 12/31/2013 01:10     Performed at [facility]   Culture     Final   Value:        BLOOD CULTURE RECEIVED NO GROWTH TO  DATE CULTURE WILL BE HELD FOR 5 DAYS BEFORE ISSUING A FINAL NEGATIVE REPORT     Performed at [facility]   Report Status PENDING   Incomplete  BODY FLUID CULTURE     Status: None   Collection Time    12/31/13  2:05 PM      Result Value Range Status   Specimen Description PLEURAL   Final   Special Requests NONE   Final   Gram Stain     Final   Value: NO WBC SEEN     NO ORGANISMS SEEN     Performed at [facility]   Culture PENDING   Incomplete   Report Status PENDING   Incomplete  CULTURE, EXPECTORATED SPUTUM-ASSESSMENT     Status: None   Collection Time    12/31/13  5:59 PM      Result Value Range Status   Specimen Description SPUTUM   Final   Special Requests NONE   Final   Sputum evaluation     Final   Value: THIS SPECIMEN IS ACCEPTABLE. RESPIRATORY CULTURE REPORT TO FOLLOW.   Report Status 12/31/2013 FINAL   Final  CULTURE, RESPIRATORY (NON-EXPECTORATED)     Status: None   Collection Time    12/31/13  5:59 PM      Result Value Range Status   Specimen Description SPUTUM   Final   Special Requests NONE   Final   Gram Stain     Final   Value: NO WBC SEEN     NO SQUAMOUS EPITHELIAL CELLS SEEN     NO ORGANISMS SEEN     Performed at [facility]   Culture     Final   Value: Culture reincubated for better growth     Performed at [facility]   Report Status PENDING   Incomplete   Testosterone= 18 ( on Hormone suppression therapy) PSA=26.49. Studies/Results: EXAM:  CHEST 2 VIEW  COMPARISON: 12/10/2013  FINDINGS:  Moderate left pleural effusion, stable since prior study. Left lower  lobe atelectasis or infiltrate. Diffuse opacities now project  throughout both lungs, likely mild edema. Heart is borderline in  size.  Advanced degenerative changes in the right shoulder. The previously  described left seventh rib lesion not visualized on this study.  IMPRESSION:  Moderate left pleural effusion with left lower lobe atelectasis or   infiltrate. Diffuse opacities throughout the lungs may reflect new  pulmonary edema.  Electronically Signed  By: Rolm Baptise M.D.  On: 12/30/2013 19:27   Assessment: Pt  Has response to Vantas with lower testosterone, but no apparent response to PSA level. ? Development of CRPCa. ECOG Level 4. Severe Ao stenosis with CHF.  Thoracentesis completed. No malignancy reported, as of now.  I have spoken with Oncology today. Dr. Alen Blew away until next week. He will be seen by covering oncologist with response to Dr. Jacalyn Lefevre concerns.  Plan:  Carolan Clines I 01/02/2014, 9:54 AM

## 2014-01-02 NOTE — Progress Notes (Signed)
ANTICOAGULATION CONSULT NOTE - Follow Up Consult  Pharmacy Consult for Heparin Indication: atrial fibrillation  Allergies  Allergen Reactions  . Other     Beer and Wine- swelling/headache/nausea    Patient Measurements: Height: 6\' 2"  (188 cm) Weight: 195 lb 12.3 oz (88.8 kg) IBW/kg (Calculated) : 82.2 Heparin Dosing Weight:   Vital Signs: Temp: 97.5 F (36.4 C) (01/21 2159) Temp src: Oral (01/21 2159) BP: 104/60 mmHg (01/21 2159) Pulse Rate: 73 (01/21 2159)  Labs:  Recent Labs  12/30/13 1722 12/31/13 0455 01/01/14 0449 01/01/14 0911 01/01/14 1820 01/01/14 2349  HGB 13.2 11.6* 11.8*  --   --   --   HCT 38.5* 33.8* 35.2*  --   --   --   PLT 325 327 308  --   --   --   APTT  --   --   --  33  --   --   LABPROT  --   --   --  14.7  --   --   INR  --   --   --  1.17  --   --   HEPARINUNFRC  --   --   --   --  0.39 0.41  CREATININE 1.17 1.13 1.04  --   --   --     Estimated Creatinine Clearance: 67 ml/min (by C-G formula based on Cr of 1.04).   Medications:  Infusions:  . heparin 1,250 Units/hr (01/01/14 9518)    Assessment: Patient with 2nd heparin level at goal.  No issues per RN.  Goal of Therapy:  Heparin level 0.3-0.7 units/ml Monitor platelets by anticoagulation protocol: Yes   Plan:  Continue with current rate, begin [facility] with 1/22 am labs  Nani Skillern Crowford 01/02/2014,2:56 AM

## 2014-01-02 NOTE — Progress Notes (Addendum)
    Subjective:  Denies CP; dyspnea improving   Objective:  Filed Vitals:   01/01/14 0511 01/01/14 1338 01/01/14 2159 01/02/14 0519  BP: 122/69 115/66 104/60 111/60  Pulse: 74 74 73 68  Temp: 97.6 F (36.4 C) 97.6 F (36.4 C) 97.5 F (36.4 C) 98.4 F (36.9 C)  TempSrc: Oral Oral Oral Oral  Resp: 20  20 18   Height:      Weight: 195 lb 12.3 oz (88.8 kg)   188 lb 7.9 oz (85.5 kg)  SpO2: 100% 100% 100% 100%    Intake/Output from previous day:  Intake/Output Summary (Last 24 hours) at 01/02/14 0705 Last data filed at 01/02/14 0600  Gross per 24 hour  Intake 598.13 ml  Output   5700 ml  Net -5101.87 ml    Physical Exam: Physical exam: Well-developed frail in no acute distress.  Skin is warm and dry.  HEENT is normal.  Neck is supple. Chest diminished BS bases Cardiovascular exam is regular rate and rhythm. 3/6 systolic murmur Abdominal exam nontender or distended. No masses palpated. Extremities show 1+ edema. neuro grossly intact    Lab Results: Basic Metabolic Panel:  Recent Labs  01/01/14 0449 01/02/14 0507  NA 141 141  K 3.5* 3.3*  CL 105 103  CO2 21 25  GLUCOSE 116* 117*  BUN 19 20  CREATININE 1.04 1.11  CALCIUM 8.7 8.4   CBC:  Recent Labs  01/01/14 0449 01/02/14 0507  WBC 8.2 8.8  HGB 11.8* 12.1*  HCT 35.2* 36.6*  MCV 94.1 94.1  PLT 308 287    Assessment/Plan:  1 acute combined systolic/diastolic congestive heart failure-the patient remains volume overloaded on examination. This is related to progressive aortic stenosis and reduced LV function. There may be a component of poor nutrition/low oncotic pressure. Continue Lasix 40 mg twice a day and follow renal function. Supplement K. Await albumin.  2 aortic stenosis-the patient has severe aortic stenosis on echo with reduced LV function. I discussed with Dr. Burt Knack yesterday. We need input from oncology concerning long-term prognosis from metastatic prostate cancer. If poor we will treat  medically. We could consider TAVR if his prognosis is relatively good. At this point he is very frail. 3 paroxysmal atrial fibrillation-the patient is noted to have paroxysmal atrial fibrillation on telemetry previously. Continue Toprol 50 mg daily. He has multiple embolic risk factors including prior stroke, age greater than 66, congestive heart failure, hypertension. Continue heparin for now. We will transition to Coumadin prior to discharge. There is risk with anticoagulation as he has fallen recently. At present I feel benefit outweighs risk. Await TSH. Patient would not be a candidate for NOAC given valve or heart disease.  4 metastatic prostate cancer-We need input from oncology concerning long-term prognosis and how aggressive to be. Patient had thoracentesis previousy for pleural effusion. Await cytology. 5 CM - most likely from aortic stenosis. Continue beta blocker. I will not add an ACE inhibitor as his blood pressure is borderline and he had severe aortic stenosis.   [name] 01/02/2014, 7:05 AM

## 2014-01-02 NOTE — Progress Notes (Addendum)
[facility] PROGRESS NOTE  Lucas Green [id number] DOB: 1932/09/06 DOA: 12/30/2013 PCP: Teressa Lower, MD  Assessment/Plan: Weakness generalized, failure to thrive with moderate proteinuria malnutrition  - likely related to metastatic prostate cancer, bone mets and maybe even new lung mets as moderate pleural effusion seen on CXR  - needs PT/OT eval  - needs nutrition consult, protein supplementation  - supportive care with IV fluids, encourage PO intake   Pleural effusion, left  - likely malignant , currently awaiting cytology - s/p thoracentesis, diagnostic and therapeutic, oxygen support via nasal canula to keep O2 saturation above 90%   Aortic valve disorder - Cardiology on board, consensus based on other specialists on board is to address current cardiac problems prior to aggressive management of prostate cancer with bone metastases.  CAP (community acquired pneumonia)  - order placed for azithromycin and rocephin  - follow up blood cultures (blood cultures no growth to date), resp culture   Hypertension  - continue metoprolol   Prostate cancer with bone metastases  - under GU and Dr. Hazeline Junker care  - Discussed with oncologist and currently recommendations are to address current cardiac problems before getting aggressive with prostate cancer management.  Hypokalemia  - repleted currently stable will monitor   Nausea and vomiting, dehydration  - likely due to prostate cancer  - Currently improved addendum patient was not on Zofran  Dyslipidemia  - continue statin therapy   Moderate protein-calorie malnutrition  - nutrition consulted and patient currently on resource protein supplement.  Asymmetric edema left leg more than right.  -Venous duplex negative for deep vein thrombosis  Code Status: DNR Family Communication: None at bedside Disposition Plan: Pending improvement in condition.   Consultants:  None  Procedures:  US thoracentesis ASP  Pleural space with IMG guide  Antibiotics:  Azithromycin  Rocephin  HPI/Subjective: Patient has no new complaints  Objective: Filed Vitals:   01/02/14 1504  BP: 89/42  Pulse: 68  Temp: 94.3 F (34.6 C)  Resp: 18    Intake/Output Summary (Last 24 hours) at 01/02/14 1659 Last data filed at 01/02/14 1506  Gross per 24 hour  Intake 413.75 ml  Output   4525 ml  Net -4111.25 ml   Filed Weights   12/31/13 0439 01/01/14 0511 01/02/14 0519  Weight: 91.1 kg (200 lb 13.4 oz) 88.8 kg (195 lb 12.3 oz) 85.5 kg (188 lb 7.9 oz)    Exam:   General:  Pt in NAD, Alert and awake  Cardiovascular: RRR, systolic murmur grade 3-4  Respiratory: no wheezes, breath sounds BL  Abdomen: soft, NT, ND  Musculoskeletal: no cyanosis or clubbing   Data Reviewed: Basic Metabolic Panel:  Recent Labs Lab 12/30/13 1722 12/31/13 0455 01/01/14 0449 01/02/14 0507  NA 140 143 141 141  K 3.5* 3.5* 3.5* 3.3*  CL 102 107 105 103  CO2 21 20 21 25   GLUCOSE 103* 99 116* 117*  BUN 21 20 19 20   CREATININE 1.17 1.13 1.04 1.11  CALCIUM 9.2 8.8 8.7 8.4   Liver Function Tests: No results found for this basename: AST, ALT, ALKPHOS, BILITOT, PROT, ALBUMIN,  in the last 168 hours No results found for this basename: LIPASE, AMYLASE,  in the last 168 hours No results found for this basename: AMMONIA,  in the last 168 hours CBC:  Recent Labs Lab 12/30/13 1722 12/31/13 0455 01/01/14 0449 01/02/14 0507  WBC 7.8 7.5 8.2 8.8  HGB 13.2 11.6* 11.8* 12.1*  HCT 38.5* 33.8* 35.2* 36.6*  MCV  92.3 93.1 94.1 94.1  PLT 325 327 308 287   Cardiac Enzymes: No results found for this basename: CKTOTAL, CKMB, CKMBINDEX, TROPONINI,  in the last 168 hours BNP (last 3 results)  Recent Labs  12/30/13 2210 01/01/14 0449  PROBNP 15574.0* 13190.0*   CBG:  Recent Labs Lab 12/31/13 0738  GLUCAP 105*    Recent Results (from the past 240 hour(s))  CULTURE, BLOOD (ROUTINE X 2)     Status: None    Collection Time    12/30/13  7:55 PM      Result Value Range Status   Specimen Description BLOOD LEFT FOREARM   Final   Special Requests BOTTLES DRAWN AEROBIC ONLY 3ML   Final   Culture  Setup Time     Final   Value: 12/31/2013 01:10     Performed at [facility]   Culture     Final   Value:        BLOOD CULTURE RECEIVED NO GROWTH TO DATE CULTURE WILL BE HELD FOR 5 DAYS BEFORE ISSUING A FINAL NEGATIVE REPORT     Performed at [facility]   Report Status PENDING   Incomplete  CULTURE, BLOOD (ROUTINE X 2)     Status: None   Collection Time    12/30/13  8:00 PM      Result Value Range Status   Specimen Description BLOOD LEFT ARM   Final   Special Requests BOTTLES DRAWN AEROBIC AND ANAEROBIC 5ML   Final   Culture  Setup Time     Final   Value: 12/31/2013 01:10     Performed at [facility]   Culture     Final   Value:        BLOOD CULTURE RECEIVED NO GROWTH TO DATE CULTURE WILL BE HELD FOR 5 DAYS BEFORE ISSUING A FINAL NEGATIVE REPORT     Performed at [facility]   Report Status PENDING   Incomplete  CULTURE, BLOOD (ROUTINE X 2)     Status: None   Collection Time    12/30/13 10:10 PM      Result Value Range Status   Specimen Description BLOOD LEFT ARM   Final   Special Requests BOTTLES DRAWN AEROBIC ONLY 4CC   Final   Culture  Setup Time     Final   Value: 12/31/2013 01:10     Performed at [facility]   Culture     Final   Value:        BLOOD CULTURE RECEIVED NO GROWTH TO DATE CULTURE WILL BE HELD FOR 5 DAYS BEFORE ISSUING A FINAL NEGATIVE REPORT     Performed at [facility]   Report Status PENDING   Incomplete  CULTURE, BLOOD (ROUTINE X 2)     Status: None   Collection Time    12/30/13 10:15 PM      Result Value Range Status   Specimen Description BLOOD LEFT HAND   Final   Special Requests BOTTLES DRAWN AEROBIC ONLY 2CC   Final   Culture  Setup Time     Final   Value: 12/31/2013 01:10     Performed at [facility]    Culture     Final   Value:        BLOOD CULTURE RECEIVED NO GROWTH TO DATE CULTURE WILL BE HELD FOR 5 DAYS BEFORE ISSUING A FINAL NEGATIVE REPORT     Performed at [facility]   Report  Status PENDING   Incomplete  BODY FLUID CULTURE     Status: None   Collection Time    12/31/13  2:05 PM      Result Value Range Status   Specimen Description PLEURAL   Final   Special Requests NONE   Final   Gram Stain     Final   Value: NO WBC SEEN     NO ORGANISMS SEEN     Performed at [facility]   Culture     Final   Value: NO GROWTH 1 DAY     Performed at [facility]   Report Status PENDING   Incomplete  CULTURE, EXPECTORATED SPUTUM-ASSESSMENT     Status: None   Collection Time    12/31/13  5:59 PM      Result Value Range Status   Specimen Description SPUTUM   Final   Special Requests NONE   Final   Sputum evaluation     Final   Value: THIS SPECIMEN IS ACCEPTABLE. RESPIRATORY CULTURE REPORT TO FOLLOW.   Report Status 12/31/2013 FINAL   Final  CULTURE, RESPIRATORY (NON-EXPECTORATED)     Status: None   Collection Time    12/31/13  5:59 PM      Result Value Range Status   Specimen Description SPUTUM   Final   Special Requests NONE   Final   Gram Stain     Final   Value: NO WBC SEEN     NO SQUAMOUS EPITHELIAL CELLS SEEN     NO ORGANISMS SEEN     Performed at [facility]   Culture     Final   Value: Culture reincubated for better growth     Performed at [facility]   Report Status PENDING   Incomplete     Studies: No results found.  Scheduled Meds: . azithromycin  500 mg Oral Q24H  . cefTRIAXone (ROCEPHIN)  IV  1 g Intravenous Q24H  . feeding supplement (PRO-STAT SUGAR FREE 64)  30 mL Oral BID WC  . furosemide  40 mg Intravenous BID  . metoprolol succinate  50 mg Oral Daily  . multivitamin with minerals  1 tablet Oral Daily  . pantoprazole  40 mg Oral Daily  . potassium chloride  20 mEq Oral BID  . protein supplement  1 scoop Oral  BID WC  . simvastatin  20 mg Oral q1800  . sodium chloride  3 mL Intravenous Q12H   Continuous Infusions: . heparin 1,250 Units/hr (01/02/14 0403)    Principal Problem:   Weakness Active Problems:   Hypertension   Prostate cancer   Metastasis from malignant tumor of prostate   Bone metastases   Pleural effusion, left   Hypokalemia   CAP (community acquired pneumonia)   Nausea and vomiting   Dyslipidemia   Failure to thrive   Moderate protein-calorie malnutrition   Aortic valve disorders    Time spent: > 35 minutes    Velvet Bathe  [facility] Pager [phone]. If 7PM-7AM, please contact night-coverage at [email or web address], password [place] 01/02/2014, 4:59 PM  LOS: 3 days

## 2014-01-02 NOTE — Progress Notes (Signed)
Physical Therapy Treatment Patient Details Name: Lucas Green MRN: [id number] DOB: [birth year] Today's Date: 01/02/2014 Time: 1941-7408 PT Time Calculation (min): 26 min  PT Assessment / Plan / Recommendation  History of Present Illness 80 yo male admitted with weakness, pleural effusion, FTT. Hx of HTN, CVA, arthritis, prostate cancer with mets, GERD   PT Comments   Progressing with mobility. Continues to fatigue fairly easily however pt able to tolerate ambulation today. Worked on sit to stand transitions for strengthening as well.  Follow Up Recommendations  CIR;Supervision/Assistance - 24 hour     Does the patient have the potential to tolerate intense rehabilitation     Barriers to Discharge        Equipment Recommendations  None recommended by PT    Recommendations for Other Services    Frequency Min 3X/week   Progress towards PT Goals Progress towards PT goals: Progressing toward goals  Plan Current plan remains appropriate    Precautions / Restrictions Precautions Precautions: Fall Restrictions Weight Bearing Restrictions: No   Pertinent Vitals/Pain  R shoulder "sore"     Mobility  Bed Mobility Overal bed mobility: Needs Assistance Supine to sit: Min guard;HOB elevated General bed mobility comments: Relied on handrails. close guard for safety.  Transfers Overall transfer level: Needs assistance Equipment used: Rolling walker (2 wheeled) Transfers: Sit to/from Stand Sit to Stand: Min guard;From elevated surface General transfer comment: VCs safety, technique, hand placement. Pt tends to use momentum in order to be able to rise.  Ambulation/Gait Ambulation/Gait assistance: Min assist Ambulation Distance (Feet): 135 Feet Assistive device: Rolling walker (2 wheeled) Gait Pattern/deviations: Trunk flexed;Decreased stride length;Step-through pattern General Gait Details: Assist to stabilize intermittently. Fatigues fairly easily. Followed with recliner.      Exercises Other Exercises Other Exercises: sit to stand transitions, from recliner, using bil armrest, 3 reps   PT Diagnosis:    PT Problem List:   PT Treatment Interventions:     PT Goals (current goals can now be found in the care plan section)    Visit Information  Last PT Received On: 01/02/14 Assistance Needed: +1 History of Present Illness: 80 yo male admitted with weakness, pleural effusion, FTT. Hx of HTN, CVA, arthritis, prostate cancer with mets, GERD    Subjective Data      Cognition  Cognition Arousal/Alertness: Awake/alert Behavior During Therapy: WFL for tasks assessed/performed Overall Cognitive Status: Within Functional Limits for tasks assessed    Balance     End of Session PT - End of Session Equipment Utilized During Treatment: Gait belt Activity Tolerance: Patient tolerated treatment well Patient left: in chair;with call bell/phone within reach;with family/visitor present   GP     Weston Anna, MPT Pager: [phone]

## 2014-01-02 NOTE — Progress Notes (Signed)
Lucas Green   DOB:[birth year]   EH#:[id number]    Subjective: He is feeling better. Shortness of breath has improved. He still has very poor appetite. Denies any chest pain.  Objective:  Filed Vitals:   01/02/14 2112  BP: 97/51  Pulse: 71  Temp: 97.4 F (36.3 C)  Resp: 17     Intake/Output Summary (Last 24 hours) at 01/02/14 2131 Last data filed at 01/02/14 2115  Gross per 24 hour  Intake    500 ml  Output   4725 ml  Net  -4225 ml    GENERAL:alert, no distress and comfortable. He has oxygen delivered via nasal cannula Musculoskeletal:no cyanosis of digits and no clubbing  NEURO: alert & oriented x 3 with fluent speech, no focal motor/sensory deficits   Labs:  Lab Results  Component Value Date   WBC 8.8 01/02/2014   HGB 12.1* 01/02/2014   HCT 36.6* 01/02/2014   MCV 94.1 01/02/2014   PLT 287 01/02/2014   NEUTROABS 5.3 11/08/2013    Lab Results  Component Value Date   NA 141 01/02/2014   K 3.3* 01/02/2014   CL 103 01/02/2014   CO2 25 01/02/2014   I reviewed his most recent echocardiogram report which shows a significant aortic stenosis as well as the presence of cardiomyopathy I have also reviewed his most recent cytology report from pleural effusion which showed no evidence of malignancy  Assessment & Plan:  #1 metastatic prostate cancer  I had a long discussion with his primary oncologist today. His overall, prognosis from metastatic prostate cancer is still measured more than a year with treatment. Recommendation would be to continue aggressive supportive care will plan on further treatment for metastatic prostate cancer in the near future #2 severe aortic stenosis with congestive heart failure I have discussed his case with his cardiologist. His main problem right now is risk of death if further cardiac intervention is not performed to correct his valvular heart disease. As mentioned above, his oncologist recommends to proceed with intervention for severe aortic  stenosis #3 pleural effusion This is not related to metastatic cancer. Cytology was negative #4 pneumonia He is improving clinically with oxygen therapy and broad-spectrum intravenous antibiotics. I recommend to continue the same   I have discussion with the patient regarding his oncologist recommendation. I have also discussed this with cardiologist as well as to hospitalist Overall spent 55 minutes on the case.    [facility], Deztiny Sarra, MD 01/02/2014  9:31 PM

## 2014-01-02 NOTE — Progress Notes (Signed)
Brief Pharmacy Consult Note - Heparin Follow Up  Labs: heparin level 0.52  A/P: heparin level therapeutic (goal 0.3-0.7) for Afib. Continue current rate of 1250 units/hr. No reported bleeding per notes. Recheck level in AM.  Adrian Saran, PharmD, BCPS Pager [phone] 01/02/2014 6:41 PM

## 2014-01-02 NOTE — Progress Notes (Signed)
ANTICOAGULATION CONSULT NOTE - Follow Up Consult  Pharmacy Consult for Heparin Indication: atrial fibrillation  Allergies  Allergen Reactions  . Other     Beer and Wine- swelling/headache/nausea    Patient Measurements: Height: 6\' 2"  (188 cm) Weight: 188 lb 7.9 oz (85.5 kg) IBW/kg (Calculated) : 82.2  Vital Signs: Temp: 98.4 F (36.9 C) (01/22 0519) Temp src: Oral (01/22 0519) BP: 111/60 mmHg (01/22 0519) Pulse Rate: 68 (01/22 0519)  Labs:  Recent Labs  12/31/13 0455 01/01/14 0449 01/01/14 0911 01/01/14 1820 01/01/14 2349 01/02/14 0507  HGB 11.6* 11.8*  --   --   --  12.1*  HCT 33.8* 35.2*  --   --   --  36.6*  PLT 327 308  --   --   --  287  APTT  --   --  33  --   --   --   LABPROT  --   --  14.7  --   --   --   INR  --   --  1.17  --   --   --   HEPARINUNFRC  --   --   --  0.39 0.41 0.56  CREATININE 1.13 1.04  --   --   --  1.11    Estimated Creatinine Clearance: 62.7 ml/min (by C-G formula based on Cr of 1.11).   Medications:  Scheduled:  . azithromycin  500 mg Oral Q24H  . cefTRIAXone (ROCEPHIN)  IV  1 g Intravenous Q24H  . feeding supplement (PRO-STAT SUGAR FREE 64)  30 mL Oral BID WC  . furosemide  40 mg Intravenous BID  . heparin  4,000 Units Intravenous Once  . metoprolol succinate  50 mg Oral Daily  . multivitamin with minerals  1 tablet Oral Daily  . pantoprazole  40 mg Oral Daily  . potassium chloride  20 mEq Oral BID  . protein supplement  1 scoop Oral BID WC  . simvastatin  20 mg Oral q1800  . sodium chloride  3 mL Intravenous Q12H   Infusions:  . heparin 1,250 Units/hr (01/02/14 0403)    Assessment: 38 yoM admitted 1/19 with weakness, metastatic prostate cancer, pleural effusion, CAP, aortic stenosis, and CHF. S/p 2L thoracentesis on 1/20, pleural effusion cytology is pending. He was noted to have paroxysmal atrial fibrillation on telemetry. Pharmacy is consulted to dose IV heparin on 01/01/14.  Note that initial heparin bolus was  never charted against (given?)  Baseline Coags: WNL;  INR 1.17 and APTT 33  SCr: 1.11 with CrCl ~ 63 ml/min  CBC: Hgb is stable/improved and Plt remain WNL.     Noted plan to transition to warfarin prior to discharge (possible TAVR procedure for aortic stenosis), but continue heparin alone for now.  No bleeding or complications reported or documented.  Heparin level, 0.56, remains within therapeutic range but is trending up on stable infusion rate of 12.5 ml/hr.  Goal of Therapy:  Heparin level 0.3-0.7 units/ml Monitor platelets by anticoagulation protocol: Yes   Plan:   Continue heparin IV infusion at 1250 units/hr  Heparin level at 1800 to confirm therapeutic level  Daily heparin level and CBC  Continue to monitor H&H and platelets  Gretta Arab PharmD, BCPS Pager [phone] 01/02/2014 8:31 AM

## 2014-01-03 ENCOUNTER — Encounter (HOSPITAL_COMMUNITY): Payer: Self-pay | Admitting: Thoracic Surgery (Cardiothoracic Vascular Surgery)

## 2014-01-03 DIAGNOSIS — I5032 Chronic diastolic (congestive) heart failure: Secondary | ICD-10-CM | POA: Insufficient documentation

## 2014-01-03 DIAGNOSIS — I5033 Acute on chronic diastolic (congestive) heart failure: Secondary | ICD-10-CM | POA: Insufficient documentation

## 2014-01-03 DIAGNOSIS — I359 Nonrheumatic aortic valve disorder, unspecified: Secondary | ICD-10-CM

## 2014-01-03 DIAGNOSIS — I480 Paroxysmal atrial fibrillation: Secondary | ICD-10-CM | POA: Diagnosis present

## 2014-01-03 DIAGNOSIS — I350 Nonrheumatic aortic (valve) stenosis: Secondary | ICD-10-CM | POA: Diagnosis present

## 2014-01-03 DIAGNOSIS — R112 Nausea with vomiting, unspecified: Secondary | ICD-10-CM

## 2014-01-03 LAB — HEPATIC FUNCTION PANEL
ALBUMIN: 3 g/dL — AB (ref 3.5–5.2)
ALT: 8 U/L (ref 0–53)
AST: 18 U/L (ref 0–37)
Alkaline Phosphatase: 276 U/L — ABNORMAL HIGH (ref 39–117)
BILIRUBIN TOTAL: 0.4 mg/dL (ref 0.3–1.2)
Bilirubin, Direct: <0.2 mg/dL (ref 0.0–0.3)
Total Protein: 6.2 g/dL (ref 6.0–8.3)

## 2014-01-03 LAB — BASIC METABOLIC PANEL
BUN: 17 mg/dL (ref 6–23)
CALCIUM: 8.7 mg/dL (ref 8.4–10.5)
CO2: 26 mEq/L (ref 19–32)
CREATININE: 1.09 mg/dL (ref 0.50–1.35)
Chloride: 98 mEq/L (ref 96–112)
GFR calc Af Amer: 73 mL/min — ABNORMAL LOW (ref >90)
GFR, EST NON AFRICAN AMERICAN: 63 mL/min — AB (ref >90)
GLUCOSE: 114 mg/dL — AB (ref 70–99)
Potassium: 3.5 mEq/L — ABNORMAL LOW (ref 3.7–5.3)
SODIUM: 139 meq/L (ref 137–147)

## 2014-01-03 LAB — CULTURE, RESPIRATORY W GRAM STAIN: Gram Stain: NONE SEEN

## 2014-01-03 LAB — CBC
HCT: 37.1 % — ABNORMAL LOW (ref 39.0–52.0)
Hemoglobin: 12.7 g/dL — ABNORMAL LOW (ref 13.0–17.0)
MCH: 32 pg (ref 26.0–34.0)
MCHC: 34.2 g/dL (ref 30.0–36.0)
MCV: 93.5 fL (ref 78.0–100.0)
Platelets: 258 10*3/uL (ref 150–400)
RBC: 3.97 MIL/uL — ABNORMAL LOW (ref 4.22–5.81)
RDW: 13.9 % (ref 11.5–15.5)
WBC: 8.7 10*3/uL (ref 4.0–10.5)

## 2014-01-03 LAB — CULTURE, RESPIRATORY: Culture: NORMAL

## 2014-01-03 LAB — HEPARIN LEVEL (UNFRACTIONATED): Heparin Unfractionated: 0.57 IU/mL (ref 0.30–0.70)

## 2014-01-03 MED ORDER — BOOST / RESOURCE BREEZE PO LIQD
1.0000 | Freq: Two times a day (BID) | ORAL | Status: DC
  Administered 2014-01-04 – 2014-01-13 (×14): 1 via ORAL
  Filled 2014-01-03 (×10): qty 1

## 2014-01-03 MED ORDER — METOPROLOL SUCCINATE ER 25 MG PO TB24
25.0000 mg | ORAL_TABLET | Freq: Every day | ORAL | Status: DC
  Administered 2014-01-03 – 2014-01-13 (×10): 25 mg via ORAL
  Filled 2014-01-03 (×12): qty 1

## 2014-01-03 NOTE — Progress Notes (Signed)
R/L heart cath scheduled for noon on Monday, 01/26, orders written.

## 2014-01-03 NOTE — Consult Note (Addendum)
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  The patient reports that he was first told that he had a heart murmur and likely bicuspid aortic valve when he was in medical school. The patient has never formally been evaluated by a cardiologist until this current hospitalization.  He reports that all of his life he was quite active physically and actually played tournament-level tennis most of his life.  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  Last April he suffered an acute stroke associated with left-sided weakness that was attributed to treatment he had been receiving for metastatic prostate cancer. An echocardiogram performed at that time demonstrated severe aortic stenosis with normal left ventricular systolic function. The patient was not referred to a cardiologist. He recovered from his stroke and reports that he had return to essentially normal activity with no residual weakness. However, approximately 3 months ago he began to develop symptoms of shortness of breath, anorexia, and progressive generalized weakness. Ultimately he developed lower extremity edema, resting shortness of breath and orthopnea.  He denies any history of chest pain, PND, dizzy spells, or syncope.  He reports occasional palpitations over the past 2 months associated with shortness of breath.  In addition, for the past 2 months he  reports significant anorexia and he thinks he may have lost 10-15 pounds in weight. He reportedly has become extremely weak.  He has not had any difficulty swallowing and bowel function has been regular.  Patient was admitted to [facility] 12/30/2013 with shortness of breath and failure to thrive.  He has had 2 falls at home because of severe weakness in his legs with unstable gait. He denies any dizzy spells or syncope. Followup echocardiogram was performed demonstrating severe aortic stenosis with peak velocity across the aortic valve greater than 4 m/s corresponding to peak and mean transvalvular gradients of 66 and 43 mm mercury respectively. Left ventricular systolic function is now severely reduced with ejection fraction estimated 30-35%.  During this hospitalization the patient has been noted to be in atrial fibrillation intermittently with controlled ventricular rate. The patient was seen in consultation by Dr. Stanford Green, and cardiothoracic surgical consultation has now been requested to discuss treatment options and consider whether or not to proceed with further diagnostic workup.  Patient states that he lives with his wife at home hearing [name]. Up until recently he reports being fairly active physically and completely functionally independent. He states that he had nearly completely recovered from the stroke he suffered last April. He drives a car and had intended to return to work in the near future until his recent problems with weakness and shortness of breath began to develop.  The patient also notes that over the past few days his shortness of breath has resolved with diuretic therapy. He reports feeling much better and he thinks his appetite  may be a bit improved. He is not having any sort of pain at all. He remains quite weak and somewhat unsteady on his feet.  Past Medical History  Diagnosis Date  . Hypertension   . GERD (gastroesophageal reflux disease)   .  Dyslipidemia   . Aortic stenosis   . Prostate cancer 09/2009  . 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 Hearing loss   . Incontinence of urine   . 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 Degenerative arthritis     osteoarthritis  . Stroke 03/29/13  . Hx of radiation therapy 09/30/13- 10/11/13    right hip/ischium, 3000 cGy 10 sessions  . Esophageal stricture   . Hiatal hernia   . Diverticulosis   . Moderate protein-calorie malnutrition 12/30/2013  . Failure to thrive 12/30/2013  . Pleural effusion, left 12/30/2013  . Severe aortic stenosis   . Atrial fibrillation   . Chronic diastolic congestive heart failure   . Acute on chronic diastolic heart failure     Past Surgical History  Procedure Laterality Date  . Tonsillectomy  1938  . Cryosurgery prostate  09/2009    prostate cancer, Gleason 7    Family History  Problem Relation Age of Onset  . Heart disease Father   . Cancer Father     prostate  . Heart attack Father   . Cancer - Colon Mother   . Thyroid disease Mother   . Stroke Maternal Grandfather   . Hypertension Maternal Grandfather   . Stroke Paternal Grandfather   . Hypertension Paternal Grandfather   . Cancer - Lung Cousin     History   Social History  . Marital Status: Married    Spouse Name: N/A    Number of Children: 1  . Years of Education: MD   Occupational History  . Not on file.   Social History Main Topics  . Smoking status: Never Smoker   . Smokeless tobacco: Never Used  . Alcohol Use: Yes     Comment: occassionally  . Drug Use: No  . Sexual Activity: Not on file   Other Topics Concern  . Not on file   Social History Narrative  . No narrative on file    Prior to Admission medications   Medication Sig Start Date End Date Taking? Authorizing Provider  clopidogrel (PLAVIX) 75 MG tablet Take 1 tablet (75 mg total) by mouth daily with breakfast. 04/12/13  Yes Lucas Anchors Love, PA-C  ibuprofen (ADVIL,MOTRIN) 800 MG tablet Take 800 mg by  mouth every 8 (eight) hours as needed for pain.   Yes Historical Provider, MD  metoprolol succinate (TOPROL-XL) 50 MG 24 hr tablet Take 25 mg by mouth daily. Take with or immediately following a meal.   Yes Historical Provider, MD  Multiple Vitamin (MULTIVITAMIN WITH MINERALS) TABS Take 1 tablet by mouth daily.   Yes Historical Provider, MD  pantoprazole (PROTONIX) 40 MG tablet Take 40 mg by mouth daily. 05/10/13  Yes Historical Provider, MD  simvastatin (ZOCOR) 20 MG tablet Take 1 tablet (20 mg total) by mouth daily at 6 PM. 04/12/13  Yes Lucas Anchors Love, PA-C  Histrelin Acetate (VANTAS Speed) Inject into the skin.    Historical Provider, MD    Current Facility-Administered Medications  Medication Dose Route Frequency Provider Last Rate Last Dose  . 0.9 %  sodium chloride infusion  250 mL Intravenous PRN Thurnell Lose, MD      . albuterol (PROVENTIL) (  2.5 MG/3ML) 0.083% nebulizer solution 2.5 mg  2.5 mg Nebulization Q2H PRN Dorothea Ogle, MD      . feeding supplement (PRO-STAT SUGAR FREE 64) liquid 30 mL  30 mL Oral BID WC Sharyne Richters, RD   30 mL at 01/03/14 0900  . feeding supplement (RESOURCE BREEZE) (RESOURCE BREEZE) liquid 1 Container  1 Container Oral BID BM Sharyne Richters, RD      . furosemide (LASIX) injection 40 mg  40 mg Intravenous BID Lewayne Bunting, MD   40 mg at 01/03/14 0900  . heparin ADULT infusion 100 units/mL (25000 units/250 mL)  1,250 Units/hr Intravenous Continuous Winfield Rast, RPH 12.5 mL/hr at 01/02/14 2213 1,250 Units/hr at 01/02/14 2213  . metoprolol succinate (TOPROL-XL) 24 hr tablet 25 mg  25 mg Oral Daily Lewayne Bunting, MD   25 mg at 01/03/14 1053  . multivitamin with minerals tablet 1 tablet  1 tablet Oral Daily Dorothea Ogle, MD   1 tablet at 01/03/14 1053  . oxyCODONE-acetaminophen (PERCOCET/ROXICET) 5-325 MG per tablet 1-2 tablet  1-2 tablet Oral Q3H PRN Dorothea Ogle, MD      . pantoprazole (PROTONIX) EC tablet 40 mg  40 mg Oral Daily Dorothea Ogle, MD   40 mg at 01/03/14 1053  . potassium chloride SA (K-DUR,KLOR-CON) CR tablet 20 mEq  20 mEq Oral BID Lewayne Bunting, MD   20 mEq at 01/03/14 1052  . protein supplement (RESOURCE BENEPROTEIN) powder packet 6 g  1 scoop Oral BID WC Sharyne Richters, RD   6 g at 01/03/14 1210  . simvastatin (ZOCOR) tablet 20 mg  20 mg Oral q1800 Dorothea Ogle, MD   20 mg at 01/02/14 1724  . sodium chloride 0.9 % injection 3 mL  3 mL Intravenous Q12H Dorothea Ogle, MD   3 mL at 01/03/14 1053  . sodium chloride 0.9 % injection 3 mL  3 mL Intravenous PRN Dorothea Ogle, MD        Allergies  Allergen Reactions  . Other     Beer and Wine- swelling/headache/nausea      Review of Systems:   General:  decreased appetite, decreased energy, no weight gain, + weight loss, no fever  Cardiac:  no chest pain with exertion, no chest pain at rest, + SOB with exertion, + resting SOB at the time of admission, no PND, no orthopnea, + palpitations, + arrhythmia, + atrial fibrillation, + LE edema, no dizzy spells, no syncope  Respiratory:  + shortness of breath, no home oxygen, no productive cough, no dry cough, no bronchitis, no wheezing, no hemoptysis, no asthma, no pain with inspiration or cough, no sleep apnea, no CPAP at night  GI:   no difficulty swallowing, + reflux, no frequent heartburn, no hiatal hernia, no abdominal pain, no constipation, no diarrhea, no hematochezia, no hematemesis, no melena  GU:   no dysuria,  no frequency, no urinary tract infection, no hematuria, no enlarged prostate, no kidney stones, no kidney disease  Vascular:  no pain suggestive of claudication, no pain in feet, no leg cramps, no varicose veins, no DVT, no non-healing foot ulcer  Neuro:   + stroke, no TIA's, no seizures, no headaches, no temporary blindness one eye,  no slurred speech, no peripheral neuropathy, no chronic pain, + instability of gait, no memory/cognitive dysfunction  Musculoskeletal: no arthritis, no joint  swelling, no myalgias, + some difficulty walking, decreased mobility   Skin:  no rash, no itching, no skin infections, no pressure sores or ulcerations  Psych:   no anxiety, no depression, no nervousness, no unusual recent stress  Eyes:   no blurry vision, no floaters, no recent vision changes, no wears glasses or contacts  ENT:   no hearing loss, no loose or painful teeth, no dentures, last saw dentist 1 year ago  Hematologic:  no easy bruising, no abnormal bleeding, no clotting disorder, no frequent epistaxis  Endocrine:  no diabetes, does not check CBG's at home     Physical Exam:   BP 107/51  Pulse 67  Temp(Src) 98.6 F (37 C) (Axillary)  Resp 18  Ht 6\' 2"  (1.88 m)  Wt 84.1 kg (185 lb 6.5 oz)  BMI 23.79 kg/m2  SpO2 100%  General:  Elderly, thin and frail-appearing  HEENT:  Unremarkable   Neck:   no JVD, no bruits, no adenopathy   Chest:   clear to auscultation, symmetrical breath sounds, no wheezes, no rhonchi   CV:   Irregular rate and rhythm,  Coarse systolic murmur best RUSB  Abdomen:  soft, non-tender, no masses   Extremities:  warm, well-perfused, pulses diminished but palpable, no edema  Rectal/GU  Deferred  Neuro:   Grossly non-focal and symmetrical throughout  Skin:   Clean and dry, no rashes, no breakdown  Diagnostic Tests:  TRANSTHORACIC ECHOCARDIOGRAM   *[place]* *[facility]* [address] [place], [place] [address] [phone]  ------------------------------------------------------------  Patient: Lucas Green, Lucas Green MR #: [id number] Study Date: 01/01/2014 Gender: M Age: 21 Height: 188cm Weight: 93kg BSA: 2.41m^2 Pt. Status: Room: [address] Aaron Edelman REFERRING [place], [place], [place] SONOGRAPHER Jimmy Reel PERFORMING [facility], Inpatient cc:  ------------------------------------------------------------ LV EF: 30% - 35%  ------------------------------------------------------------ Indications:  Aortic stenosis 424.1. Hypertension - benign without CHF 402.10.  ------------------------------------------------------------ Study Conclusions  - Left ventricle: The cavity size was moderately to severely dilated. Wall thickness was normal. Systolic function was moderately to severely reduced. The estimated ejection fraction was in the range of 30% to 35%. Regional wall motion abnormalities cannot be excluded. Features are consistent with a pseudonormal left ventricular filling pattern, with concomitant abnormal relaxation and increased filling pressure (grade 2 diastolic dysfunction). - Aortic valve: There was severe stenosis. Mild regurgitation. Valve area: 0.67cm^2(VTI). Valve area: 0.68cm^2 (Vmax). - Left atrium: The atrium was mildly dilated. - Pulmonary arteries: PA peak pressure: 44mm Hg (S). Height: Height: 188cm. Height: 74in. Weight: Weight: 93kg. Weight: 204.6lb. Body mass index: BMI: 26.3kg/m^2. Body surface area: BSA: 2.47m^2. Blood pressure: 133/74. Patient status: Inpatient. Location: Bedside.  ------------------------------------------------------------  ------------------------------------------------------------ Left ventricle: Poorly visualized. The cavity size was moderately to severely dilated. Wall thickness was normal. Systolic function was moderately to severely reduced. The estimated ejection fraction was in the range of 30% to 35%. Regional wall motion abnormalities cannot be excluded. Features are consistent with a pseudonormal left ventricular filling pattern, with concomitant abnormal relaxation and increased filling pressure (grade 2 diastolic dysfunction).  ------------------------------------------------------------ Aortic valve: Moderately thickened, moderately calcified leaflets. Doppler: There was severe stenosis. Mild regurgitation. Valve area: 0.67cm^2(VTI). Indexed valve area: 0.3cm^2/m^2 (VTI). Peak velocity ratio of LVOT to aortic  valve: 0.2. Valve area: 0.68cm^2 (Vmax). Indexed valve area: 0.31cm^2/m^2 (Vmax). Mean gradient: 49mm Hg (S). Peak gradient: 2mm Hg (S).  ------------------------------------------------------------ Aorta: The aorta was normal, not dilated, and non-diseased.  ------------------------------------------------------------ Mitral valve: Doppler: Peak gradient: 13mm Hg (D).  ------------------------------------------------------------ Left atrium: The atrium was mildly dilated.  ------------------------------------------------------------ Right ventricle: The  cavity size was at the upper limits of normal. Systolic function was normal.  ------------------------------------------------------------ Right atrium: The atrium was at the upper limits of normal in size.  ------------------------------------------------------------ Pericardium: There was no pericardial effusion.  ------------------------------------------------------------ Systemic veins: Inferior vena cava: The vessel was dilated; the respirophasic diameter changes were blunted (< 50%); findings are consistent with elevated central venous pressure.  ------------------------------------------------------------ Post procedure conclusions Ascending Aorta:  - The aorta was normal, not dilated, and non-diseased.  ------------------------------------------------------------  2D measurements Normal Doppler measurements Normal Left ventricle Main pulmonary LVID ED, 66.3 mm 43-52 artery chord, Pressure, 45 mm Hg =30 PLAX S LVID ES, 56.6 mm 23-38 Left ventricle chord, Ea, med 5.7 cm/s ------ PLAX ann, tiss FS, chord, 15 % >29 DP PLAX E/Ea, med 23.8 ------ LVPW, ED 8.81 mm ------ ann, tiss 6 IVS/LVPW 0.97 <1.3 DP ratio, ED LVOT Ventricular septum Peak vel, 80.1 cm/s ------ IVS, ED 8.55 mm ------ S LVOT Aortic valve Diam, S 21 mm ------ Peak vel, 407 cm/s ------ Area 3.46 cm^2 ------ S Aorta Mean vel, 311 cm/s  ------ Root diam, 33 mm ------ S ED VTI, S 94.4 cm ------ Left atrium Mean 43 mm Hg ------ AP dim 44 mm ------ gradient, AP dim 2 cm/m^2 <2.2 S index Peak 66 mm Hg ------ gradient, S Area, VTI 0.67 cm^2 ------ Area index 0.3 cm^2/m ------ (VTI) ^2 Peak vel 0.2 ------ ratio, LVOT/AV Area, Vmax 0.68 cm^2 ------ Area index 0.31 cm^2/m ------ (Vmax) ^2 Mitral valve Peak E vel 136 cm/s ------ Peak A vel 65.6 cm/s ------ Decelerati 419 ms 150-23 on time 0 Peak 7 mm Hg ------ gradient, D Peak E/A 2.1 ------ ratio Tricuspid valve Regurg 296 cm/s ------ peak vel Peak RV-RA 35 mm Hg ------ gradient, S Max regurg 296 cm/s ------ vel Systemic veins Estimated 10 mm Hg ------ CVP Right ventricle Pressure, 45 mm Hg <30 S Sa vel, 10.4 cm/s ------ lat ann, tiss DP  ------------------------------------------------------------ Prepared and Electronically Authenticated by  Darlin Coco 2015-01-21T14:46:45.030        STS Risk Calculator  Procedure    AVR  Risk of Mortality   2.6% Morbidity or Mortality  21.6% Prolonged LOS   9.8% Short LOS    23.2% Permanent Stroke   2.5% Prolonged Vent Support  13.9% DSW Infection    0.5% Renal Failure    5.6% Reoperation    9.4%    Impression:  The patient has severe symptomatic aortic stenosis associated with recent development of acute exacerbation of chronic diastolic congestive heart failure. Left ventricular systolic function is depressed with ejection fraction estimated 30-35%.  The patient also has been demonstrated to have recurrent paroxysmal atrial fibrillation and he suffered a stroke this past April. The patient has known metastatic prostate cancer, although clinically he has been doing well with respect to this problem recently. He was admitted to the hospital in class IV congestive heart failure with severe generalized weakness, anorexia with protein-depleted malnutrition, and failure to thrive.   Symptomatically he seems to be a bit improved with diuretic therapy for congestive heart failure, although he still appears quite weak and frail. Although risks associated with conventional surgical aortic valve replacement would only be used mildly elevated using the STS risk model, at this point in time I feel the patient would likely struggle with conventional open heart surgery because of his severe generalized weakness, malnutrition, and physically debilitated condition. At this point risks associated with conventional surgery would appear to be high  with potentially greater than 15% risk of mortality and 50% risk of serious morbidity. Transcatheter aortic valve replacement might be a reasonable alternative under the circumstances. It's also possible that conventional surgery could become a more attractive option if he gains significant strength and improves further with medical therapy.  I feel it may be reasonable to proceed with an aggressive approach to therapy given the somewhat indolent course that may be associated with metastatic prostate cancer and the fact that the patient had been doing remarkably well up until recently.  We await consultation from Oncology with respect to what his long term prognosis might be regarding his known malignancy.   Plan:  I discussed matters at length with Dr. Rolanda Jay in his hospital room this afternoon. Alternative approaches such as conventional aortic valve replacement, transcatheter aortic valve replacement, and palliative medical therapy were compared and contrasted at length.  The risks associated with conventional surgical aortic valve replacement were discussed in detail, as were expectations for post-operative convalescence. Long-term prognosis with medical therapy was discussed. This discussion was placed in the context of the patient's own specific clinical presentation and past medical history.  All of his questions been addressed.  I favor proceeding  with further diagnostic workup to begin with left and right heart catheterization. He will need CTA of the chest, abdomen, and pelvis followed by cardiac gated CTA of the heart.  He should be evaluated by physical therapy to include 6-minute walk test and formal assessment of frailty.  We will plan to followup next week.    Valentina Gu. Roxy Manns, MD 01/03/2014 2:58 PM  I spent in excess of 90 minutes of time directly involved in the conduct of this consultation.

## 2014-01-03 NOTE — Progress Notes (Signed)
[facility] PROGRESS NOTE  Lucas Green IWP:[id number] DOB: [birth year] DOA: 12/30/2013 PCP: Teressa Lower, MD  Assessment/Plan: Weakness generalized, failure to thrive with moderate proteinuria malnutrition  - likely related to metastatic prostate cancer, bone mets and maybe even new lung mets as moderate pleural effusion seen on CXR  - PT/OT working with patient  - nutrition consult: protein supplementation per consult - supportive care with IV fluids, encourage PO intake  - afebrile and WBC's WNL: antibiotics discontinued. Initial chest x-ray reported possible infiltrate but more likely this is related to pleural effusion as patient does not have clinical which are consistent with active pneumonia.  Pleural effusion, left  - cytology from thoracentesis negative - s/p thoracentesis 1/20, diagnostic and therapeutic, oxygen support via nasal canula to keep O2 saturation above 90%   Aortic valve disorder - Cardiology on board, consensus based on other specialists on board is to address current cardiac problems prior to aggressive management of prostate cancer with bone metastases. -pt to be evaluated by Dr. Roxy Manns for TAVR assessment. If candidate will transfer to [facility] for catheterization and CTA   CAP (community acquired pneumonia)  - Discontinue azithromycin and rocephin today. Afebrile and WBC's WNL - follow up blood cultures: no growth, resp culture: no growth  Hypertension  - continue metoprolol   Prostate cancer with bone metastases  - under GU and Dr. Hazeline Junker care  - Discussed with oncologist and currently recommendations are to address current cardiac problems before getting aggressive with prostate cancer management.  Hypokalemia  - repleted currently stable will monitor  - reassess next am.  Nausea and vomiting, dehydration  - likely due to prostate cancer. - resolved Saline lock - unable to use zofran related to prolonged QT syndrome    Dyslipidemia  - continue statin therapy   Moderate protein-calorie malnutrition  - nutrition consulted and patient currently on resource protein supplement.  Asymmetric edema left leg more than right.  -Venous duplex negative for deep vein thrombosis  Code Status: DNR Family Communication: wife at bedside. Updated on plan of care, awaiting surgical evaluation. Disposition Plan: Pending surgical consult and improvement in condition.   Consultants:  Urology  Cardiology   Procedures:  US thoracentesis ASP Pleural space with IMG guide  Antibiotics:  Azithromycin (day 5/5)  Rocephin (day 5/5)  HPI/Subjective: Patient has no new complaints.  Objective: Filed Vitals:   01/03/14 0453  BP: 107/51  Pulse: 67  Temp: 98.6 F (37 C)  Resp: 18    Intake/Output Summary (Last 24 hours) at 01/03/14 1426 Last data filed at 01/03/14 5053  Gross per 24 hour  Intake    650 ml  Output   2225 ml  Net  -1575 ml   Filed Weights   01/01/14 0511 01/02/14 0519 01/03/14 0453  Weight: 88.8 kg (195 lb 12.3 oz) 85.5 kg (188 lb 7.9 oz) 84.1 kg (185 lb 6.5 oz)    Exam:   General:  Pt in NAD, Alert and awake  Cardiovascular: RRR, systolic murmur grade 3-4  Respiratory: no wheezes, breath sounds BL  Abdomen: soft, NT, ND  Musculoskeletal: no cyanosis or clubbing   Data Reviewed: Basic Metabolic Panel:  Recent Labs Lab 12/30/13 1722 12/31/13 0455 01/01/14 0449 01/02/14 0507 01/03/14 0500  NA 140 143 141 141 139  K 3.5* 3.5* 3.5* 3.3* 3.5*  CL 102 107 105 103 98  CO2 21 20 21 25 26   GLUCOSE 103* 99 116* 117* 114*  BUN 21 20 19 20  17  CREATININE 1.17 1.13 1.04 1.11 1.09  CALCIUM 9.2 8.8 8.7 8.4 8.7   Liver Function Tests:  Recent Labs Lab 01/03/14 0500  AST 18  ALT 8  ALKPHOS 276*  BILITOT 0.4  PROT 6.2  ALBUMIN 3.0*   No results found for this basename: LIPASE, AMYLASE,  in the last 168 hours No results found for this basename: AMMONIA,  in the last  168 hours CBC:  Recent Labs Lab 12/30/13 1722 12/31/13 0455 01/01/14 0449 01/02/14 0507 01/03/14 0800  WBC 7.8 7.5 8.2 8.8 8.7  HGB 13.2 11.6* 11.8* 12.1* 12.7*  HCT 38.5* 33.8* 35.2* 36.6* 37.1*  MCV 92.3 93.1 94.1 94.1 93.5  PLT 325 327 308 287 258   Cardiac Enzymes: No results found for this basename: CKTOTAL, CKMB, CKMBINDEX, TROPONINI,  in the last 168 hours BNP (last 3 results)  Recent Labs  12/30/13 2210 01/01/14 0449  PROBNP 15574.0* 13190.0*   CBG:  Recent Labs Lab 12/31/13 0738  GLUCAP 105*    Recent Results (from the past 240 hour(s))  CULTURE, BLOOD (ROUTINE X 2)     Status: None   Collection Time    12/30/13  7:55 PM      Result Value Range Status   Specimen Description BLOOD LEFT FOREARM   Final   Special Requests BOTTLES DRAWN AEROBIC ONLY 3ML   Final   Culture  Setup Time     Final   Value: 12/31/2013 01:10     Performed at [facility]   Culture     Final   Value:        BLOOD CULTURE RECEIVED NO GROWTH TO DATE CULTURE WILL BE HELD FOR 5 DAYS BEFORE ISSUING A FINAL NEGATIVE REPORT     Performed at [facility]   Report Status PENDING   Incomplete  CULTURE, BLOOD (ROUTINE X 2)     Status: None   Collection Time    12/30/13  8:00 PM      Result Value Range Status   Specimen Description BLOOD LEFT ARM   Final   Special Requests BOTTLES DRAWN AEROBIC AND ANAEROBIC 5ML   Final   Culture  Setup Time     Final   Value: 12/31/2013 01:10     Performed at [facility]   Culture     Final   Value:        BLOOD CULTURE RECEIVED NO GROWTH TO DATE CULTURE WILL BE HELD FOR 5 DAYS BEFORE ISSUING A FINAL NEGATIVE REPORT     Performed at [facility]   Report Status PENDING   Incomplete  CULTURE, BLOOD (ROUTINE X 2)     Status: None   Collection Time    12/30/13 10:10 PM      Result Value Range Status   Specimen Description BLOOD LEFT ARM   Final   Special Requests BOTTLES DRAWN AEROBIC ONLY 4CC   Final   Culture  Setup  Time     Final   Value: 12/31/2013 01:10     Performed at [facility]   Culture     Final   Value:        BLOOD CULTURE RECEIVED NO GROWTH TO DATE CULTURE WILL BE HELD FOR 5 DAYS BEFORE ISSUING A FINAL NEGATIVE REPORT     Performed at [facility]   Report Status PENDING   Incomplete  CULTURE, BLOOD (ROUTINE X 2)     Status: None   Collection Time  12/30/13 10:15 PM      Result Value Range Status   Specimen Description BLOOD LEFT HAND   Final   Special Requests BOTTLES DRAWN AEROBIC ONLY 2CC   Final   Culture  Setup Time     Final   Value: 12/31/2013 01:10     Performed at [facility]   Culture     Final   Value:        BLOOD CULTURE RECEIVED NO GROWTH TO DATE CULTURE WILL BE HELD FOR 5 DAYS BEFORE ISSUING A FINAL NEGATIVE REPORT     Performed at [facility]   Report Status PENDING   Incomplete  BODY FLUID CULTURE     Status: None   Collection Time    12/31/13  2:05 PM      Result Value Range Status   Specimen Description PLEURAL   Final   Special Requests NONE   Final   Gram Stain     Final   Value: NO WBC SEEN     NO ORGANISMS SEEN     Performed at [facility]   Culture     Final   Value: NO GROWTH 2 DAYS     Performed at [facility]   Report Status PENDING   Incomplete  CULTURE, EXPECTORATED SPUTUM-ASSESSMENT     Status: None   Collection Time    12/31/13  5:59 PM      Result Value Range Status   Specimen Description SPUTUM   Final   Special Requests NONE   Final   Sputum evaluation     Final   Value: THIS SPECIMEN IS ACCEPTABLE. RESPIRATORY CULTURE REPORT TO FOLLOW.   Report Status 12/31/2013 FINAL   Final  CULTURE, RESPIRATORY (NON-EXPECTORATED)     Status: None   Collection Time    12/31/13  5:59 PM      Result Value Range Status   Specimen Description SPUTUM   Final   Special Requests NONE   Final   Gram Stain     Final   Value: NO WBC SEEN     NO SQUAMOUS EPITHELIAL CELLS SEEN     NO ORGANISMS SEEN      Performed at [facility]   Culture     Final   Value: NORMAL OROPHARYNGEAL FLORA     Performed at [facility]   Report Status 01/03/2014 FINAL   Final     Studies: No results found.  Scheduled Meds: . azithromycin  500 mg Oral Q24H  . cefTRIAXone (ROCEPHIN)  IV  1 g Intravenous Q24H  . feeding supplement (PRO-STAT SUGAR FREE 64)  30 mL Oral BID WC  . furosemide  40 mg Intravenous BID  . metoprolol succinate  25 mg Oral Daily  . multivitamin with minerals  1 tablet Oral Daily  . pantoprazole  40 mg Oral Daily  . potassium chloride  20 mEq Oral BID  . protein supplement  1 scoop Oral BID WC  . simvastatin  20 mg Oral q1800  . sodium chloride  3 mL Intravenous Q12H   Continuous Infusions: . heparin 1,250 Units/hr (01/02/14 2213)    Principal Problem:   Weakness Active Problems:   Hypertension   Prostate cancer   Metastasis from malignant tumor of prostate   Bone metastases   Pleural effusion, left   Hypokalemia   CAP (community acquired pneumonia)   Nausea and vomiting   Dyslipidemia   Failure to thrive  Moderate protein-calorie malnutrition   Aortic valve disorders   Severe aortic stenosis   Atrial fibrillation   Chronic diastolic congestive heart failure   Acute on chronic diastolic heart failure    Time spent: > 40 minutes    Larwance Sachs  [facility] Pager [phone]. If 7PM-7AM, please contact night-coverage at [email or web address], password [facility] 01/03/2014, 2:26 PM  LOS: 4 days

## 2014-01-03 NOTE — Progress Notes (Signed)
    Subjective:  Denies CP; dyspnea continues to improve   Objective:  Filed Vitals:   01/02/14 1039 01/02/14 1504 01/02/14 2112 01/03/14 0453  BP:  89/42 97/51 107/51  Pulse: 72 68 71 67  Temp:  94.3 F (34.6 C) 97.4 F (36.3 C) 98.6 F (37 C)  TempSrc:  Oral Oral Axillary  Resp:  18 17 18   Height:      Weight:    185 lb 6.5 oz (84.1 kg)  SpO2:  100% 100% 100%    Intake/Output from previous day:  Intake/Output Summary (Last 24 hours) at 01/03/14 2841 Last data filed at 01/03/14 0600  Gross per 24 hour  Intake    350 ml  Output   3725 ml  Net  -3375 ml    Physical Exam: Physical exam: Well-developed frail in no acute distress.  Skin is warm and dry.  HEENT is normal.  Neck is supple. Chest diminished BS bases Cardiovascular exam is regular rate and rhythm. 3/6 systolic murmur Abdominal exam nontender or distended. No masses palpated. Extremities show trace edema. neuro grossly intact    Lab Results: Basic Metabolic Panel:  Recent Labs  01/02/14 0507 01/03/14 0500  NA 141 139  K 3.3* 3.5*  CL 103 98  CO2 25 26  GLUCOSE 117* 114*  BUN 20 17  CREATININE 1.11 1.09  CALCIUM 8.4 8.7   CBC:  Recent Labs  01/01/14 0449 01/02/14 0507  WBC 8.2 8.8  HGB 11.8* 12.1*  HCT 35.2* 36.6*  MCV 94.1 94.1  PLT 308 287    Assessment/Plan:  1 acute combined systolic/diastolic congestive heart failure-the patient remains mildly volume overloaded on examination but much improved. This is related to progressive aortic stenosis and reduced LV function. Continue Lasix 40 mg twice a day and follow renal function. Supplement K. Albumin 3. 2 aortic stenosis-the patient has severe aortic stenosis on echo with reduced LV function. Based on oncology's notes the patient's prognosis is greater than one year. I do not think he is a good surgical candidate but should be evaluated for possible TAVR. I reviewed this with Dr. Burt Knack this morning. He is leaving town today. I will  asked Dr. Roxy Manns to see the patient. If he is felt to be a candidate for TAVR we will arrange transfer to [facility] for cardiac catheterization as well as CTA of his thoracic and abdominal aorta. Continue diuresis over the weekend. 3 paroxysmal atrial fibrillation-the patient was noted to have paroxysmal atrial fibrillation on telemetry previously. Continue Toprol but decrease to 25 mg daily as BP borderline. He has multiple embolic risk factors including prior stroke, age greater than 62, congestive heart failure, hypertension. Continue heparin for now. We will transition to Coumadin prior to discharge once it is clear all procedures complete. Await TSH. Patient would not be a candidate for NOAC given valvular heart disease.  4 metastatic prostate cancer-Cytology from thoracentesis negative; management per oncology; based on their opinion, prognosis greater than one year. 5 CM - most likely from aortic stenosis. Continue beta blocker. I will not add an ACE inhibitor as his blood pressure is borderline and he had severe aortic stenosis.   Lucas Green 01/03/2014, 7:12 AM

## 2014-01-03 NOTE — Progress Notes (Signed)
ANTICOAGULATION CONSULT NOTE - Follow Up Consult  Pharmacy Consult for Heparin Indication: atrial fibrillation  Allergies  Allergen Reactions  . Other     Beer and Wine- swelling/headache/nausea    Patient Measurements: Height: 6\' 2"  (188 cm) Weight: 185 lb 6.5 oz (84.1 kg) IBW/kg (Calculated) : 82.2  Vital Signs: Temp: 98.6 F (37 C) (01/23 0453) Temp src: Axillary (01/23 0453) BP: 107/51 mmHg (01/23 0453) Pulse Rate: 67 (01/23 0453)  Labs:  Recent Labs  01/01/14 0449 01/01/14 0911  01/02/14 0507 01/02/14 1729 01/03/14 0500  HGB 11.8*  --   --  12.1*  --   --   HCT 35.2*  --   --  36.6*  --   --   PLT 308  --   --  287  --   --   APTT  --  33  --   --   --   --   LABPROT  --  14.7  --   --   --   --   INR  --  1.17  --   --   --   --   HEPARINUNFRC  --   --   < > 0.56 0.52 0.57  CREATININE 1.04  --   --  1.11  --  1.09  < > = values in this interval not displayed.  Estimated Creatinine Clearance: 63.9 ml/min (by C-G formula based on Cr of 1.09).   Medications:  Scheduled:  . azithromycin  500 mg Oral Q24H  . cefTRIAXone (ROCEPHIN)  IV  1 g Intravenous Q24H  . feeding supplement (PRO-STAT SUGAR FREE 64)  30 mL Oral BID WC  . furosemide  40 mg Intravenous BID  . metoprolol succinate  50 mg Oral Daily  . multivitamin with minerals  1 tablet Oral Daily  . pantoprazole  40 mg Oral Daily  . potassium chloride  20 mEq Oral BID  . protein supplement  1 scoop Oral BID WC  . simvastatin  20 mg Oral q1800  . sodium chloride  3 mL Intravenous Q12H   Infusions:  . heparin 1,250 Units/hr (01/02/14 2213)    Assessment: 89 yoM admitted 1/19 with weakness, metastatic prostate cancer, pleural effusion, CAP, aortic stenosis, and CHF. S/p 2L thoracentesis on 1/20, pleural effusion cytology is pending. He was noted to have paroxysmal atrial fibrillation on telemetry. Pharmacy is consulted to dose IV heparin on 01/01/14.  SCr: 1.1 with CrCl ~ 64 ml/min  CBC: Hgb is  stable/improved and Plt remain WNL.   (1/22)  Noted plan to transition to warfarin prior to discharge, but continue heparin alone for now (d/t possible cardiac procedure)  No bleeding or complications reported or documented.  Heparin level 0.57, remains stable within therapeutic range on infusion rate of 12.5 ml/hr.  Goal of Therapy:  Heparin level 0.3-0.7 units/ml Monitor platelets by anticoagulation protocol: Yes   Plan:   Continue heparin IV infusion at 1250 units/hr  Daily heparin level and CBC; continue to monitor H&H and platelets  Follow up long-term anticoagulation plans when appropriate.  Gretta Arab PharmD, BCPS Pager [phone] 01/03/2014 7:05 AM

## 2014-01-03 NOTE — Progress Notes (Signed)
Urology Progress Note : CaP. Metastatic. 1 month post Vantas, with no change in psa yet. No more pain post Radrx pelvic met.  Biggest problem is Ao stenosis. PtforRx Monday per Drs Crenshaw/cooper.   Subjective:     No acute urologic events overnight. Ambulation:   negative Flatus:    positive Bowel movement  positive  Pain: some relief  Objective:  Blood pressure 107/51, pulse 67, temperature 98.6 F (37 C), temperature source Axillary, resp. rate 18, height $RemoveBe'6\' 2"'[id number]$  (1.88 m), weight 84.1 kg (185 lb 6.5 oz), SpO2 100.00%.  Physical Exam:  General:  No acute distress, awake Resp: clear to auscultation bilaterally Genitourinary:  normal Foley:  Yes for volume evaluation.     I/O last 3 completed shifts: In: 650 [I.V.:500; IV Piggyback:150] Out: 6525 [Urine:6525]  Recent Labs     01/02/14  0507  01/03/14  0800  HGB  12.1*  12.7*  WBC  8.8  8.7  PLT  287  258    Recent Labs     01/02/14  0507  01/03/14  0500  NA  141  139  K  3.3*  3.5*  CL  103  98  CO2  25  26  BUN  20  17  CREATININE  1.11  1.09  CALCIUM  8.4  8.7  GFRNONAA  61*  63*  GFRAA  71*  73*     Recent Labs     01/01/14  0911  INR  1.17  APTT  33     No components found with this basename: ABG,   Assessment/Plan: Continue to to follow.

## 2014-01-03 NOTE — Progress Notes (Signed)
Discussed with Dr Roxy Manns; he feels he may be a candidate for TAVR; plan R and L cath on Monday with Dr Burt Knack. Continue lasix but hold after AM dose Sunday precath. Following cath he should remain at [facility] for further WU including CTA.  [name]

## 2014-01-03 NOTE — Progress Notes (Signed)
NUTRITION FOLLOW UP  Intervention:   - Recommend to add Resource Breeze po BID, each supplement provides 250 kcal and 9 grams of protein - Provided pt and pt's wife with "Poor Appetite", "Increasing Calories and Protein", and "Nausea and Vomiting" handouts from [employer] - Provided pt's and pt's wife protein supplement Pro-Stat recipe guide for additional ideas to increase supplement pallatability  Nutrition Dx:   Inadequate oral intake related to abd pain/early satiety as evidenced by PO intake <75%.   Goal:   Pt to meet >/= 90% of their estimated nutrition needs    Monitor:   Total protein/energy intake, labs, weights, GI profile  Assessment:   1/20: -Pt with feelings of early satiety for three weeks with has significantly inhibited PO intake. Is only able to consume small amounts of meals, approximately 25%. Attributes lack of appetite to radiation treatments  -Encouraged snacks/frequent meals and consuming liquids between meals to assist in preventing early satiety  -Reported feelings of nausea/vomiting that also inhibit PO intake. Admitted with dehydration  -Pt reported weight loss has occurred but was unable to quantify amount. Has lost 10 lbs in one month, and 20 lbs in 3 months per previous medical records  -Assisted pt in ordering meals. Will place on room service assist program to ensure pt's choices/preferences are honored  -Declined use of supplementation as he feels Ensure/Boost are "too heavy" and fill him up too quickly.  -Was willing to try to incorporate snack of yogurt and skim milk. Declined use of whole milk as the full fat products also fill him up quickly.  -MD noted pt with prostate cancer with bone mets and possible new lung mets  -Pt with visible signs of moderate wasting in occipital, upper arm and temple region.  -ADDENDUM: Spoke with pt's wife later today, pt has been complying with BRAT diet to assist with GI intolerances, but  continues to only take 1-2 bites of meals. Pt disliked vanilla and chocolate flavors of nutrition supplement, wife was willing to encourage strawberry flavor  -Wife is was also in agreement for pt to try Beneprotein and Pro-Stat protein supplements. Low volume of supplements may be beneficial for pt's early satiety   1/23: - Pt continues with decreased appetite, consuming >25% of trays - Finds some nausea relief by eating very small bites and pacing meal - Has been able to tolerate [employer] over Ensure. Will add to supplement order -Consumes one Pro-Stat and one Beneprotein daily. Does not enjoy taste very much, but understand their importance. Has been mixing with cold juices/applesauces. Offered additional suggestions to improve their taste. Provided pt's wife with supplement recipe booklet for additional ideas - Pt noted may start him on Reglan tomorrow. Wife reported MD researching potential anti-nausea medications that would be the least detrimental to pt's cardiovascular issues -Accepted educational materials concerning n/v, abd pain -Pt enjoying PM nourishment of yogurt  Height: Ht Readings from Last 1 Encounters:  12/30/13 6\' 2"  (1.88 m)    Weight Status:   Wt Readings from Last 1 Encounters:  01/03/14 185 lb 6.5 oz (84.1 kg)  12/31/13 200 lbs  Re-estimated needs:  Kcal: 2300-2500  Protein:110-120 gram  Fluid: 2700 ml/daily  Skin: WDL  Diet Order: General   Intake/Output Summary (Last 24 hours) at 01/03/14 1506 Last data filed at 01/03/14 0927  Gross per 24 hour  Intake 536.25 ml  Output   2000 ml  Net -1463.75 ml    Last BM: 1/22  Labs:   Recent Labs Lab 01/01/14 0449 01/02/14 0507 01/03/14 0500  NA 141 141 139  K 3.5* 3.3* 3.5*  CL 105 103 98  CO2 21 25 26   BUN 19 20 17   CREATININE 1.04 1.11 1.09  CALCIUM 8.7 8.4 8.7  GLUCOSE 116* 117* 114*    CBG (last 3)  No results found for this basename: GLUCAP,  in the last 72 hours  Scheduled  Meds: . feeding supplement (PRO-STAT SUGAR FREE 64)  30 mL Oral BID WC  . feeding supplement (RESOURCE BREEZE)  1 Container Oral BID BM  . furosemide  40 mg Intravenous BID  . metoprolol succinate  25 mg Oral Daily  . multivitamin with minerals  1 tablet Oral Daily  . pantoprazole  40 mg Oral Daily  . potassium chloride  20 mEq Oral BID  . protein supplement  1 scoop Oral BID WC  . simvastatin  20 mg Oral q1800  . sodium chloride  3 mL Intravenous Q12H    Continuous Infusions: . heparin 1,250 Units/hr (01/02/14 2213)    Atlee Abide MS RD LDN Clinical Dietitian [phone]

## 2014-01-04 DIAGNOSIS — R5381 Other malaise: Secondary | ICD-10-CM

## 2014-01-04 DIAGNOSIS — I5033 Acute on chronic diastolic (congestive) heart failure: Secondary | ICD-10-CM

## 2014-01-04 DIAGNOSIS — R5383 Other fatigue: Secondary | ICD-10-CM

## 2014-01-04 LAB — PULMONARY FUNCTION TEST
DL/VA % pred: 49 %
DL/VA: 2.38 ml/min/mmHg/L
DLCO COR % PRED: 33 %
DLCO UNC: 12.63 ml/min/mmHg
DLCO cor: 12.46 ml/min/mmHg
DLCO unc % pred: 33 %
FEF 25-75 Post: 2.03 L/sec
FEF 25-75 Pre: 1.61 L/sec
FEF2575-%Change-Post: 26 %
FEF2575-%PRED-PRE: 67 %
FEF2575-%Pred-Post: 85 %
FEV1-%Change-Post: 6 %
FEV1-%PRED-POST: 69 %
FEV1-%PRED-PRE: 64 %
FEV1-Post: 2.35 L
FEV1-Pre: 2.2 L
FEV1FVC-%Change-Post: 7 %
FEV1FVC-%Pred-Pre: 101 %
FEV6-%Change-Post: -2 %
FEV6-%PRED-POST: 66 %
FEV6-%Pred-Pre: 67 %
FEV6-PRE: 3.02 L
FEV6-Post: 2.96 L
FEV6FVC-%PRED-PRE: 106 %
FEV6FVC-%Pred-Post: 106 %
FVC-%Change-Post: -1 %
FVC-%PRED-PRE: 63 %
FVC-%Pred-Post: 63 %
FVC-POST: 2.99 L
FVC-Pre: 3.02 L
POST FEV6/FVC RATIO: 100 %
Post FEV1/FVC ratio: 79 %
Pre FEV1/FVC ratio: 73 %
Pre FEV6/FVC Ratio: 100 %
RV % PRED: 86 %
RV: 2.5 L
TLC % pred: 72 %
TLC: 5.66 L

## 2014-01-04 LAB — BODY FLUID CULTURE
Culture: NO GROWTH
Gram Stain: NONE SEEN

## 2014-01-04 LAB — PREALBUMIN: Prealbumin: 13.4 mg/dL — ABNORMAL LOW (ref 17.0–34.0)

## 2014-01-04 LAB — BASIC METABOLIC PANEL
BUN: 18 mg/dL (ref 6–23)
CHLORIDE: 99 meq/L (ref 96–112)
CO2: 27 meq/L (ref 19–32)
Calcium: 8.6 mg/dL (ref 8.4–10.5)
Creatinine, Ser: 1.13 mg/dL (ref 0.50–1.35)
GFR calc Af Amer: 69 mL/min — ABNORMAL LOW (ref >90)
GFR calc non Af Amer: 60 mL/min — ABNORMAL LOW (ref >90)
Glucose, Bld: 115 mg/dL — ABNORMAL HIGH (ref 70–99)
Potassium: 3.7 mEq/L (ref 3.7–5.3)
Sodium: 139 mEq/L (ref 137–147)

## 2014-01-04 LAB — HEMOGLOBIN A1C
HEMOGLOBIN A1C: 5.8 % — AB (ref <5.7)
MEAN PLASMA GLUCOSE: 120 mg/dL — AB (ref <117)

## 2014-01-04 LAB — HEPARIN LEVEL (UNFRACTIONATED): Heparin Unfractionated: 0.47 IU/mL (ref 0.30–0.70)

## 2014-01-04 NOTE — Progress Notes (Signed)
SUBJECTIVE:  Breathing is improved.  Able to sleep supine  OBJECTIVE:   Vitals:   Filed Vitals:   01/02/14 2112 01/03/14 0453 01/03/14 2100 01/04/14 0454  BP: 97/51 107/51 111/65 105/63  Pulse: 71 67 69 68  Temp: 97.4 F (36.3 C) 98.6 F (37 C) 98.7 F (37.1 C) 98.5 F (36.9 C)  TempSrc: Oral Axillary Oral Oral  Resp: 17 18 16 18   Height:      Weight:  185 lb 6.5 oz (84.1 kg)  184 lb 4.8 oz (83.598 kg)  SpO2: 100% 100% 100% 100%   I&O's:   Intake/Output Summary (Last 24 hours) at 01/04/14 0716 Last data filed at 01/04/14 0456  Gross per 24 hour  Intake    300 ml  Output   3100 ml  Net  -2800 ml   TELEMETRY: Reviewed telemetry pt in NSR:     PHYSICAL EXAM General: Well developed, well nourished, in no acute distress Head: Eyes PERRLA, No xanthomas.   Normal cephalic and atramatic  Lungs:   Few crackles at bases Heart:   HRRR S1 S2 Pulses are 2+ & equal. Abdomen: Bowel sounds are positive, abdomen soft and non-tender without masses Extremities:   No clubbing, cyanosis or edema.  DP +1 Neuro: Alert and oriented X 3. Psych:  Good affect, responds appropriately   LABS: Basic Metabolic Panel:  Recent Labs  01/03/14 0500 01/04/14 0438  NA 139 139  K 3.5* 3.7  CL 98 99  CO2 26 27  GLUCOSE 114* 115*  BUN 17 18  CREATININE 1.09 1.13  CALCIUM 8.7 8.6   Liver Function Tests:  Recent Labs  01/03/14 0500  AST 18  ALT 8  ALKPHOS 276*  BILITOT 0.4  PROT 6.2  ALBUMIN 3.0*   No results found for this basename: LIPASE, AMYLASE,  in the last 72 hours CBC:  Recent Labs  01/02/14 0507 01/03/14 0800  WBC 8.8 8.7  HGB 12.1* 12.7*  HCT 36.6* 37.1*  MCV 94.1 93.5  PLT 287 258   Cardiac Enzymes: No results found for this basename: CKTOTAL, CKMB, CKMBINDEX, TROPONINI,  in the last 72 hours BNP: No components found with this basename: POCBNP,  D-Dimer: No results found for this basename: DDIMER,  in the last 72 hours Hemoglobin A1C: No results found for  this basename: HGBA1C,  in the last 72 hours Fasting Lipid Panel: No results found for this basename: CHOL, HDL, LDLCALC, TRIG, CHOLHDL, LDLDIRECT,  in the last 72 hours Thyroid Function Tests: No results found for this basename: TSH, T4TOTAL, FREET3, T3FREE, THYROIDAB,  in the last 72 hours Anemia Panel: No results found for this basename: VITAMINB12, FOLATE, FERRITIN, TIBC, IRON, RETICCTPCT,  in the last 72 hours Coag Panel:   Lab Results  Component Value Date   INR 1.17 01/01/2014   INR 1.05 03/29/2013    RADIOLOGY: Dg Chest 1 View  12/31/2013   CLINICAL DATA:  Post left thoracentesis  EXAM: CHEST - 1 VIEW  COMPARISON:  12/1913  FINDINGS: There is significant decreased opacity on the left reflecting evacuation of most of the left pleural effusion. There is no pneumothorax. Opacity at the left lung base may reflect infiltrate or atelectasis.  There is vascular congestion centrally throughout the right lung with mild diffuse hazy opacity similar to the prior study.  IMPRESSION: No pneumothorax following thoracentesis on the left. Marked reduction in left pleural effusion.   Electronically Signed   By: Lajean Manes M.D.   On: 12/31/2013  14:32   Dg Chest 2 View  12/30/2013   CLINICAL DATA:  Weakness, shortness of breath.  EXAM: CHEST  2 VIEW  COMPARISON:  12/10/2013  FINDINGS: Moderate left pleural effusion, stable since prior study. Left lower lobe atelectasis or infiltrate. Diffuse opacities now project throughout both lungs, likely mild edema. Heart is borderline in size.  Advanced degenerative changes in the right shoulder. The previously described left seventh rib lesion not visualized on this study.  IMPRESSION: Moderate left pleural effusion with left lower lobe atelectasis or infiltrate. Diffuse opacities throughout the lungs may reflect new pulmonary edema.   Electronically Signed   By: Rolm Baptise M.D.   On: 12/30/2013 19:27   Dg Chest 2 View  12/10/2013   CLINICAL DATA:  Shortness of  breath with cough. Recent pneumonia. Metastatic prostate cancer.  EXAM: CHEST  2 VIEW  COMPARISON:  Radiographs 09/13/2013. Whole body bone scan 09/19/2013.  FINDINGS: Left-greater-than-right pleural effusions have enlarged. There is increased left basilar airspace disease without consolidation. There is no edema. The heart size and mediastinal contours are stable. There is sclerosis of the left 7th rib laterally which may correspond with the focal uptake on recent bone scan. Severe glenohumeral arthropathy is noted on the right.  IMPRESSION: Enlarging left-greater-than-right pleural effusions with associated left lower lobe airspace disease. This may reflect atelectasis or any infiltrate. Possible metastasis to the left 7th rib laterally.   Electronically Signed   By: Camie Patience M.D.   On: 12/10/2013 14:57   US Thoracentesis Asp Pleural Space W/img Guide  12/31/2013   CLINICAL DATA:  Shortness of breath, left-sided pleural effusion. Request diagnostic and therapeutic thoracentesis.  EXAM: ULTRASOUND GUIDED left THORACENTESIS  COMPARISON:  None.  FINDINGS: A total of approximately 2 L of clear yellow fluid was removed. A fluid sample wassent for laboratory analysis.  IMPRESSION: Successful ultrasound guided left thoracentesis yielding 2 L of pleural fluid.  Read by: Ascencion Dike PA-C  PROCEDURE: An ultrasound guided thoracentesis was thoroughly discussed with the patient and questions answered. The benefits, risks, alternatives and complications were also discussed. The patient understands and wishes to proceed with the procedure. Written consent was obtained.  Ultrasound was performed to localize and mark an adequate pocket of fluid in the left chest. The area was then prepped and draped in the normal sterile fashion. 1% Lidocaine was used for local anesthesia. Under ultrasound guidance a 19 gauge Yueh catheter was introduced. Thoracentesis was performed. The catheter was removed and a dressing applied.   Complications:  None immediate   Electronically Signed   By: Maryclare Bean M.D.   On: 12/31/2013 14:23    Assessment/Plan:  1 acute combined systolic/diastolic congestive heart failure-the patient remains mildly volume overloaded on examination but much improved. This is related to progressive aortic stenosis and reduced LV function. Continue Lasix 40 mg twice a day and follow renal function. Supplement K. Albumin 3.  2 aortic stenosis-the patient has severe aortic stenosis on echo with reduced LV function. Based on oncology's notes the patient's prognosis is greater than one year. Dr. Roxy Manns evaluated patient and he feels patient may be a good candidate for TAVR.  We will arrange transfer to [facility] [place] Monday for cardiac catheterization as well as CTA of his thoracic and abdominal aorta. Continue diuresis over the weekend.  3 paroxysmal atrial fibrillation-the patient was noted to have paroxysmal atrial fibrillation on telemetry previously. Continue Toprol. He has multiple embolic risk factors including prior stroke, age greater than  75, congestive heart failure, hypertension. Continue heparin for now. We will transition to Coumadin prior to discharge once it is clear all procedures complete. Await TSH. Patient would not be a candidate for NOAC given valvular heart disease.  4 metastatic prostate cancer-Cytology from thoracentesis negative; management per oncology; based on their opinion, prognosis greater than one year.  5 CM - most likely from aortic stenosis. Continue beta blocker.No  ACE inhibitor as his blood pressure is borderline and he had severe aortic stenosis.    Lucas Margarita, MD  01/04/2014  7:16 AM

## 2014-01-04 NOTE — Progress Notes (Signed)
Subjective: Patient reports : no  pain  Objective: Vital signs in last 24 hours: Temp:  [98.5 F (36.9 C)-98.7 F (37.1 C)] 98.5 F (36.9 C) (01/24 0454) Pulse Rate:  [68-69] 68 (01/24 0454) Resp:  [16-18] 18 (01/24 0454) BP: (105-111)/(63-65) 105/63 mmHg (01/24 0454) SpO2:  [100 %] 100 % (01/24 0454) Weight:  [83.598 kg (184 lb 4.8 oz)] 83.598 kg (184 lb 4.8 oz) (01/24 0454)  Intake/Output from previous day: 01/23 0701 - 01/24 0700 In: 300 [P.O.:300] Out: 3100 [Urine:3100] Intake/Output this shift:    Physical Exam:  General:Alert and oriented.  In no apparent distress Abdomen: Soft, non tender.  Foley draining clear urine  Lab Results:  Recent Labs  01/02/14 0507 01/03/14 0800  HGB 12.1* 12.7*  HCT 36.6* 37.1*   BMET  Recent Labs  01/03/14 0500 01/04/14 0438  NA 139 139  K 3.5* 3.7  CL 98 99  CO2 26 27  GLUCOSE 114* 115*  BUN 17 18  CREATININE 1.09 1.13  CALCIUM 8.7 8.6    Recent Labs  01/01/14 0911  INR 1.17   No results found for this basename: LABURIN,  in the last 72 hours Results for orders placed during the hospital encounter of 12/30/13  CULTURE, BLOOD (ROUTINE X 2)     Status: None   Collection Time    12/30/13  7:55 PM      Result Value Range Status   Specimen Description BLOOD LEFT FOREARM   Final   Special Requests BOTTLES DRAWN AEROBIC ONLY 3ML   Final   Culture  Setup Time     Final   Value: 12/31/2013 01:10     Performed at [facility]   Culture     Final   Value:        BLOOD CULTURE RECEIVED NO GROWTH TO DATE CULTURE WILL BE HELD FOR 5 DAYS BEFORE ISSUING A FINAL NEGATIVE REPORT     Performed at [facility]   Report Status PENDING   Incomplete  CULTURE, BLOOD (ROUTINE X 2)     Status: None   Collection Time    12/30/13  8:00 PM      Result Value Range Status   Specimen Description BLOOD LEFT ARM   Final   Special Requests BOTTLES DRAWN AEROBIC AND ANAEROBIC 5ML   Final   Culture  Setup Time     Final    Value: 12/31/2013 01:10     Performed at [facility]   Culture     Final   Value:        BLOOD CULTURE RECEIVED NO GROWTH TO DATE CULTURE WILL BE HELD FOR 5 DAYS BEFORE ISSUING A FINAL NEGATIVE REPORT     Performed at [facility]   Report Status PENDING   Incomplete  CULTURE, BLOOD (ROUTINE X 2)     Status: None   Collection Time    12/30/13 10:10 PM      Result Value Range Status   Specimen Description BLOOD LEFT ARM   Final   Special Requests BOTTLES DRAWN AEROBIC ONLY 4CC   Final   Culture  Setup Time     Final   Value: 12/31/2013 01:10     Performed at [facility]   Culture     Final   Value:        BLOOD CULTURE RECEIVED NO GROWTH TO DATE CULTURE WILL BE HELD FOR 5 DAYS BEFORE ISSUING A FINAL NEGATIVE REPORT  Performed at [facility]   Report Status PENDING   Incomplete  CULTURE, BLOOD (ROUTINE X 2)     Status: None   Collection Time    12/30/13 10:15 PM      Result Value Range Status   Specimen Description BLOOD LEFT HAND   Final   Special Requests BOTTLES DRAWN AEROBIC ONLY 2CC   Final   Culture  Setup Time     Final   Value: 12/31/2013 01:10     Performed at [facility]   Culture     Final   Value:        BLOOD CULTURE RECEIVED NO GROWTH TO DATE CULTURE WILL BE HELD FOR 5 DAYS BEFORE ISSUING A FINAL NEGATIVE REPORT     Performed at [facility]   Report Status PENDING   Incomplete  BODY FLUID CULTURE     Status: None   Collection Time    12/31/13  2:05 PM      Result Value Range Status   Specimen Description PLEURAL   Final   Special Requests NONE   Final   Gram Stain     Final   Value: NO WBC SEEN     NO ORGANISMS SEEN     Performed at [facility]   Culture     Final   Value: NO GROWTH 2 DAYS     Performed at [facility]   Report Status PENDING   Incomplete  CULTURE, EXPECTORATED SPUTUM-ASSESSMENT     Status: None   Collection Time    12/31/13  5:59 PM      Result Value Range Status    Specimen Description SPUTUM   Final   Special Requests NONE   Final   Sputum evaluation     Final   Value: THIS SPECIMEN IS ACCEPTABLE. RESPIRATORY CULTURE REPORT TO FOLLOW.   Report Status 12/31/2013 FINAL   Final  CULTURE, RESPIRATORY (NON-EXPECTORATED)     Status: None   Collection Time    12/31/13  5:59 PM      Result Value Range Status   Specimen Description SPUTUM   Final   Special Requests NONE   Final   Gram Stain     Final   Value: NO WBC SEEN     NO SQUAMOUS EPITHELIAL CELLS SEEN     NO ORGANISMS SEEN     Performed at [facility]   Culture     Final   Value: NORMAL OROPHARYNGEAL FLORA     Performed at [facility]   Report Status 01/03/2014 FINAL   Final    Studies/Results: No results found.  Assessment/Plan: * Metastatic prostate cancer * Aortic stenosis * Leave Foley indwelling * Continue to follow     LOS: 5 days   Hanley Ben 01/04/2014, 8:17 AM

## 2014-01-04 NOTE — Evaluation (Signed)
Occupational Therapy Evaluation Patient Details Name: Lucas Green MRN: [id number] DOB: 26-Sep-1932 Today's Date: 01/04/2014 Time: 4098-1191 OT Time Calculation (min): 37 min  OT Assessment / Plan / Recommendation History of present illness 80 yo male admitted with weakness, pleural effusion, FTT. Hx of HTN, CVA, arthritis, prostate cancer with mets, GERD   Clinical Impression   Pt limited by dizzy/ "off kilter" feeling this am sitting EOB and also became nauseous. Nursing informed. Will benefit from skilled OT services to increase activity tolerance and independence with self care tasks for next venue of care.    OT Assessment  Patient needs continued OT Services    Follow Up Recommendations  CIR;Supervision/Assistance - 24 hour    Barriers to Discharge      Equipment Recommendations  3 in 1 bedside comode    Recommendations for Other Services    Frequency  Min 2X/week    Precautions / Restrictions Precautions Precautions: Fall Restrictions Weight Bearing Restrictions: No   Pertinent Vitals/Pain 2/10 L UE "sore" reposition    ADL  Eating/Feeding: Simulated;Independent Where Assessed - Eating/Feeding: Bed level Grooming: Performed;Wash/dry face;Supervision/safety (for safety due to dizzy feeling) Where Assessed - Grooming: Unsupported sitting Upper Body Bathing: Simulated;Chest;Right arm;Left arm;Abdomen;Supervision/safety Where Assessed - Upper Body Bathing: Unsupported sitting Lower Body Bathing: Simulated;Moderate assistance (dizzy, pt didnt feel he could stand this session) Where Assessed - Lower Body Bathing: Rolling right and/or left Upper Body Dressing: Simulated;Minimal assistance Where Assessed - Upper Body Dressing: Unsupported sitting Lower Body Dressing: Simulated;Maximal assistance (due to dizzy feeling, roll as unable to stand this session) Where Assessed - Lower Body Dressing: Rolling right and/or left Toilet Transfer:  (not able this session) ADL  Comments: Pt transferred to EOB and sat greater than 10 minutes and reporting a dizzy or "off kilter" feeling while sitting that didnt improve with time. Pt started to feel a little nauseous also. BP taken 110/59. Pt states he doesnt think he can stand this session so returned to supine and informed nursing of pt feeling dizzy.    OT Diagnosis: Generalized weakness  OT Problem List: Decreased strength;Decreased activity tolerance;Decreased knowledge of use of DME or AE OT Treatment Interventions: Self-care/ADL training;DME and/or AE instruction;Therapeutic activities;Patient/family education   OT Goals(Current goals can be found in the care plan section) Acute Rehab OT Goals Patient Stated Goal: to be able to get up and get stronger OT Goal Formulation: With patient Time For Goal Achievement: 01/18/14 Potential to Achieve Goals: Good  Visit Information  Last OT Received On: 01/04/14 Assistance Needed: +1 History of Present Illness: 80 yo male admitted with weakness, pleural effusion, FTT. Hx of HTN, CVA, arthritis, prostate cancer with mets, GERD       Prior Functioning     Home Living Family/patient expects to be discharged to:: Private residence Living Arrangements: Spouse/significant other Available Help at Discharge: Family (wife works M-F 8 hour days) Type of Home: House Home Access: Stairs to enter [employer] of Steps: 1 Home Layout: Multi-level;Able to live on main level with bedroom/bathroom Home Equipment: Gilford Rile - 2 wheels;Cane - single point;Shower seat Prior Function Level of Independence: Independent with assistive device(s) Comments: using cane for ambulation Communication Communication: No difficulties Dominant Hand: Right         Vision/Perception     Cognition  Cognition Arousal/Alertness: Awake/alert Behavior During Therapy: WFL for tasks assessed/performed Overall Cognitive Status: Within Functional Limits for tasks assessed     Extremity/Trunk Assessment Upper Extremity Assessment Upper Extremity Assessment: RUE deficits/detail RUE  Deficits / Details: grossly about 90 degrees shoulder flexion due to arthritis, elbow distal WFL     Mobility Bed Mobility Overal bed mobility: Needs Assistance Bed Mobility: Supine to Sit Supine to sit: Min guard;HOB elevated Transfers General transfer comment: deferred this session due to dizzy feeling and some nausea     Exercise     Balance General Comments General comments (skin integrity, edema, etc.): sat EOB greater than 10 minutes with supervision. cushion placed under pt for pressure relief.   End of Session OT - End of Session Activity Tolerance: Other (comment) (nausea and dizzy feeling) Patient left: in bed;with call bell/phone within reach;with bed alarm set  GO     Jules Schick [phone] 01/04/2014, 10:04 AM

## 2014-01-04 NOTE — Progress Notes (Signed)
ANTICOAGULATION CONSULT NOTE - Follow Up Consult  Pharmacy Consult for Heparin Indication: atrial fibrillation  Allergies  Allergen Reactions  . Other     Beer and Wine- swelling/headache/nausea    Patient Measurements: Height: 6\' 2"  (188 cm) Weight: 184 lb 4.8 oz (83.598 kg) IBW/kg (Calculated) : 82.2  Vital Signs: Temp: 98.5 F (36.9 C) (01/24 0454) Temp src: Oral (01/24 0454) BP: 110/59 mmHg (01/24 0930) Pulse Rate: 79 (01/24 0930)  Labs:  Recent Labs  01/02/14 0507 01/02/14 1729 01/03/14 0500 01/03/14 0800 01/04/14 0438  HGB 12.1*  --   --  12.7*  --   HCT 36.6*  --   --  37.1*  --   PLT 287  --   --  258  --   HEPARINUNFRC 0.56 0.52 0.57  --  0.47  CREATININE 1.11  --  1.09  --  1.13    Estimated Creatinine Clearance: 61.6 ml/min (by C-G formula based on Cr of 1.13).   Medications:  Scheduled:  . feeding supplement (PRO-STAT SUGAR FREE 64)  30 mL Oral BID WC  . feeding supplement (RESOURCE BREEZE)  1 Container Oral BID BM  . furosemide  40 mg Intravenous BID  . metoprolol succinate  25 mg Oral Daily  . multivitamin with minerals  1 tablet Oral Daily  . pantoprazole  40 mg Oral Daily  . potassium chloride  20 mEq Oral BID  . protein supplement  1 scoop Oral BID WC  . simvastatin  20 mg Oral q1800  . sodium chloride  3 mL Intravenous Q12H   Infusions:  . heparin 1,250 Units/hr (01/03/14 1808)    Assessment: 43 yoM admitted 1/19 with weakness, metastatic prostate cancer, pleural effusion, CAP, aortic stenosis, and CHF. S/p 2L thoracentesis on 1/20, pleural effusion cytology is pending. He was noted to have paroxysmal atrial fibrillation on telemetry. Pharmacy is consulted to dose IV heparin on 01/01/14.  SCr: 1.1 with CrCl ~ 61 ml/min  CBC: Hgb is stable/improved and Plt remain WNL  Noted plan to transition to warfarin prior to discharge, but continue heparin alone for now, plans for heart cath at [facility] on Monday 1/26  No bleeding or complications  per RN  Heparin level 0.47, remains stable within therapeutic range on infusion rate of 12.5 ml/hr.  Goal of Therapy:  Heparin level 0.3-0.7 units/ml Monitor platelets by anticoagulation protocol: Yes   Plan:   Continue heparin IV infusion at 1250 units/hr  Daily heparin level and CBC; continue to monitor H&H and platelets  Follow up long-term anticoagulation plans when appropriate.  Thank you for the consult. Johny Drilling, PharmD, BCPS Pager: [phone] Pharmacy: [phone] 01/04/2014 10:15 AM

## 2014-01-04 NOTE — Progress Notes (Signed)
[facility] PROGRESS NOTE  Lucas Green GEX:[id number] DOB: 15-Dec-1931 DOA: 12/30/2013 PCP: Teressa Lower, MD  Assessment/Plan: Weakness generalized, failure to thrive with moderate proteinuria malnutrition  - nutrition consult: protein supplementation per consult - supportive care with IV fluids, encourage PO intake   Pleural effusion, left  - cytology from thoracentesis negative - s/p thoracentesis 1/20, diagnostic and therapeutic, oxygen support via nasal canula to keep O2 saturation above 90%   Aortic valve disorder - Cardiology on board possible transfer to [place] 01/06/14 for possible TAVR.  CAP (community acquired pneumonia)  - Discontinue azithromycin and rocephin today. Afebrile and WBC's WNL. SOB most likely due to pleural effusion. - follow up blood cultures: no growth, resp culture: no growth  Hypertension  - continue metoprolol   Prostate cancer with bone metastases  - under GU and Dr. Hazeline Junker care  - Discussed with oncologist and currently recommendations are to address current cardiac problems before getting aggressive with prostate cancer management.  Hypokalemia  - repleted currently stable will monitor  - reassess next am.  Nausea and vomiting, dehydration  - likely due to prostate cancer. - resolved Saline lock - unable to use zofran related to prolonged QT syndrome   Dyslipidemia  - continue statin therapy   Moderate protein-calorie malnutrition  - nutrition consulted and patient currently on resource protein supplement.  Asymmetric edema left leg more than right.  -Venous duplex negative for deep vein thrombosis  Code Status: DNR Family Communication: wife at bedside. Updated on plan of care, awaiting surgical evaluation. Disposition Plan: Pending surgical consult and improvement in condition.   Consultants:  Urology  Cardiology   Procedures:  US thoracentesis ASP Pleural space with IMG guide  Antibiotics:  Azithromycin  (day 5/5)  Rocephin (day 5/5)  HPI/Subjective: Patient has no new complaints. Feeling better. No complaints of nausea currently.  Objective: Filed Vitals:   01/04/14 1330  BP: 92/51  Pulse: 72  Temp: 97.4 F (36.3 C)  Resp: 18    Intake/Output Summary (Last 24 hours) at 01/04/14 1650 Last data filed at 01/04/14 1340  Gross per 24 hour  Intake      0 ml  Output   3100 ml  Net  -3100 ml   Filed Weights   01/02/14 0519 01/03/14 0453 01/04/14 0454  Weight: 85.5 kg (188 lb 7.9 oz) 84.1 kg (185 lb 6.5 oz) 83.598 kg (184 lb 4.8 oz)    Exam:   General:  Pt in NAD, Alert and awake  Cardiovascular: RRR, systolic murmur grade 3-4  Respiratory: no wheezes, breath sounds BL  Abdomen: soft, NT, ND  Musculoskeletal: no cyanosis or clubbing   Data Reviewed: Basic Metabolic Panel:  Recent Labs Lab 12/31/13 0455 01/01/14 0449 01/02/14 0507 01/03/14 0500 01/04/14 0438  NA 143 141 141 139 139  K 3.5* 3.5* 3.3* 3.5* 3.7  CL 107 105 103 98 99  CO2 20 21 25 26 27   GLUCOSE 99 116* 117* 114* 115*  BUN 20 19 20 17 18   CREATININE 1.13 1.04 1.11 1.09 1.13  CALCIUM 8.8 8.7 8.4 8.7 8.6   Liver Function Tests:  Recent Labs Lab 01/03/14 0500  AST 18  ALT 8  ALKPHOS 276*  BILITOT 0.4  PROT 6.2  ALBUMIN 3.0*   No results found for this basename: LIPASE, AMYLASE,  in the last 168 hours No results found for this basename: AMMONIA,  in the last 168 hours CBC:  Recent Labs Lab 12/30/13 1722 12/31/13 0455 01/01/14 0449 01/02/14  0507 01/03/14 0800  WBC 7.8 7.5 8.2 8.8 8.7  HGB 13.2 11.6* 11.8* 12.1* 12.7*  HCT 38.5* 33.8* 35.2* 36.6* 37.1*  MCV 92.3 93.1 94.1 94.1 93.5  PLT 325 327 308 287 258   Cardiac Enzymes: No results found for this basename: CKTOTAL, CKMB, CKMBINDEX, TROPONINI,  in the last 168 hours BNP (last 3 results)  Recent Labs  12/30/13 2210 01/01/14 0449  PROBNP 15574.0* 13190.0*   CBG:  Recent Labs Lab 12/31/13 0738  GLUCAP 105*     Recent Results (from the past 240 hour(s))  CULTURE, BLOOD (ROUTINE X 2)     Status: None   Collection Time    12/30/13  7:55 PM      Result Value Range Status   Specimen Description BLOOD LEFT FOREARM   Final   Special Requests BOTTLES DRAWN AEROBIC ONLY 3ML   Final   Culture  Setup Time     Final   Value: 12/31/2013 01:10     Performed at [facility]   Culture     Final   Value:        BLOOD CULTURE RECEIVED NO GROWTH TO DATE CULTURE WILL BE HELD FOR 5 DAYS BEFORE ISSUING A FINAL NEGATIVE REPORT     Performed at [facility]   Report Status PENDING   Incomplete  CULTURE, BLOOD (ROUTINE X 2)     Status: None   Collection Time    12/30/13  8:00 PM      Result Value Range Status   Specimen Description BLOOD LEFT ARM   Final   Special Requests BOTTLES DRAWN AEROBIC AND ANAEROBIC 5ML   Final   Culture  Setup Time     Final   Value: 12/31/2013 01:10     Performed at [facility]   Culture     Final   Value:        BLOOD CULTURE RECEIVED NO GROWTH TO DATE CULTURE WILL BE HELD FOR 5 DAYS BEFORE ISSUING A FINAL NEGATIVE REPORT     Performed at [facility]   Report Status PENDING   Incomplete  CULTURE, BLOOD (ROUTINE X 2)     Status: None   Collection Time    12/30/13 10:10 PM      Result Value Range Status   Specimen Description BLOOD LEFT ARM   Final   Special Requests BOTTLES DRAWN AEROBIC ONLY 4CC   Final   Culture  Setup Time     Final   Value: 12/31/2013 01:10     Performed at [facility]   Culture     Final   Value:        BLOOD CULTURE RECEIVED NO GROWTH TO DATE CULTURE WILL BE HELD FOR 5 DAYS BEFORE ISSUING A FINAL NEGATIVE REPORT     Performed at [facility]   Report Status PENDING   Incomplete  CULTURE, BLOOD (ROUTINE X 2)     Status: None   Collection Time    12/30/13 10:15 PM      Result Value Range Status   Specimen Description BLOOD LEFT HAND   Final   Special Requests BOTTLES DRAWN AEROBIC ONLY 2CC   Final    Culture  Setup Time     Final   Value: 12/31/2013 01:10     Performed at [facility]   Culture     Final   Value:        BLOOD CULTURE RECEIVED NO  GROWTH TO DATE CULTURE WILL BE HELD FOR 5 DAYS BEFORE ISSUING A FINAL NEGATIVE REPORT     Performed at [facility]   Report Status PENDING   Incomplete  BODY FLUID CULTURE     Status: None   Collection Time    12/31/13  2:05 PM      Result Value Range Status   Specimen Description PLEURAL   Final   Special Requests NONE   Final   Gram Stain     Final   Value: NO WBC SEEN     NO ORGANISMS SEEN     Performed at [facility]   Culture     Final   Value: NO GROWTH 3 DAYS     Performed at [facility]   Report Status 01/04/2014 FINAL   Final  CULTURE, EXPECTORATED SPUTUM-ASSESSMENT     Status: None   Collection Time    12/31/13  5:59 PM      Result Value Range Status   Specimen Description SPUTUM   Final   Special Requests NONE   Final   Sputum evaluation     Final   Value: THIS SPECIMEN IS ACCEPTABLE. RESPIRATORY CULTURE REPORT TO FOLLOW.   Report Status 12/31/2013 FINAL   Final  CULTURE, RESPIRATORY (NON-EXPECTORATED)     Status: None   Collection Time    12/31/13  5:59 PM      Result Value Range Status   Specimen Description SPUTUM   Final   Special Requests NONE   Final   Gram Stain     Final   Value: NO WBC SEEN     NO SQUAMOUS EPITHELIAL CELLS SEEN     NO ORGANISMS SEEN     Performed at [facility]   Culture     Final   Value: NORMAL OROPHARYNGEAL FLORA     Performed at [facility]   Report Status 01/03/2014 FINAL   Final     Studies: No results found.  Scheduled Meds: . feeding supplement (PRO-STAT SUGAR FREE 64)  30 mL Oral BID WC  . feeding supplement (RESOURCE BREEZE)  1 Container Oral BID BM  . furosemide  40 mg Intravenous BID  . metoprolol succinate  25 mg Oral Daily  . multivitamin with minerals  1 tablet Oral Daily  . pantoprazole  40 mg Oral Daily  .  potassium chloride  20 mEq Oral BID  . protein supplement  1 scoop Oral BID WC  . simvastatin  20 mg Oral q1800  . sodium chloride  3 mL Intravenous Q12H   Continuous Infusions: . heparin 1,250 Units/hr (01/04/14 1344)    Principal Problem:   Weakness Active Problems:   Hypertension   Prostate cancer   Metastasis from malignant tumor of prostate   Bone metastases   Pleural effusion, left   Hypokalemia   CAP (community acquired pneumonia)   Nausea and vomiting   Dyslipidemia   Failure to thrive   Moderate protein-calorie malnutrition   Aortic valve disorders   Severe aortic stenosis   Atrial fibrillation   Chronic diastolic congestive heart failure   Acute on chronic diastolic heart failure    Time spent: > 40 minutes    Lucas Green  [facility] Pager [phone]. If 7PM-7AM, please contact night-coverage at [email or web address], password [facility] 01/04/2014, 4:50 PM  LOS: 5 days

## 2014-01-05 DIAGNOSIS — C7951 Secondary malignant neoplasm of bone: Secondary | ICD-10-CM

## 2014-01-05 DIAGNOSIS — I10 Essential (primary) hypertension: Secondary | ICD-10-CM

## 2014-01-05 DIAGNOSIS — I6350 Cerebral infarction due to unspecified occlusion or stenosis of unspecified cerebral artery: Secondary | ICD-10-CM

## 2014-01-05 DIAGNOSIS — C61 Malignant neoplasm of prostate: Secondary | ICD-10-CM

## 2014-01-05 DIAGNOSIS — C7952 Secondary malignant neoplasm of bone marrow: Secondary | ICD-10-CM

## 2014-01-05 DIAGNOSIS — E785 Hyperlipidemia, unspecified: Secondary | ICD-10-CM

## 2014-01-05 DIAGNOSIS — J189 Pneumonia, unspecified organism: Secondary | ICD-10-CM

## 2014-01-05 DIAGNOSIS — I359 Nonrheumatic aortic valve disorder, unspecified: Secondary | ICD-10-CM

## 2014-01-05 LAB — TSH: TSH: 7.102 u[IU]/mL — ABNORMAL HIGH (ref 0.350–4.500)

## 2014-01-05 LAB — BASIC METABOLIC PANEL
BUN: 24 mg/dL — AB (ref 6–23)
CHLORIDE: 97 meq/L (ref 96–112)
CO2: 27 meq/L (ref 19–32)
Calcium: 8.7 mg/dL (ref 8.4–10.5)
Creatinine, Ser: 1.11 mg/dL (ref 0.50–1.35)
GFR calc Af Amer: 71 mL/min — ABNORMAL LOW (ref >90)
GFR calc non Af Amer: 61 mL/min — ABNORMAL LOW (ref >90)
Glucose, Bld: 116 mg/dL — ABNORMAL HIGH (ref 70–99)
POTASSIUM: 3.8 meq/L (ref 3.7–5.3)
Sodium: 138 mEq/L (ref 137–147)

## 2014-01-05 LAB — T4, FREE: Free T4: 1.4 ng/dL (ref 0.80–1.80)

## 2014-01-05 LAB — T3, FREE: T3, Free: 3.1 pg/mL (ref 2.3–4.2)

## 2014-01-05 LAB — HEPARIN LEVEL (UNFRACTIONATED): Heparin Unfractionated: 0.41 IU/mL (ref 0.30–0.70)

## 2014-01-05 MED ORDER — SODIUM CHLORIDE 0.9 % IJ SOLN
3.0000 mL | INTRAMUSCULAR | Status: DC | PRN
  Administered 2014-01-06: 3 mL via INTRAVENOUS

## 2014-01-05 MED ORDER — SODIUM CHLORIDE 0.9 % IV SOLN
INTRAVENOUS | Status: DC
Start: 2014-01-06 — End: 2014-01-06

## 2014-01-05 MED ORDER — ASPIRIN 81 MG PO CHEW
324.0000 mg | CHEWABLE_TABLET | ORAL | Status: DC
  Filled 2014-01-05: qty 4

## 2014-01-05 MED ORDER — SODIUM CHLORIDE 0.9 % IV SOLN: 250.0000 mL | INTRAVENOUS | Status: DC | PRN

## 2014-01-05 MED ORDER — SODIUM CHLORIDE 0.9 % IJ SOLN: 3.0000 mL | Freq: Two times a day (BID) | INTRAMUSCULAR | Status: DC

## 2014-01-05 MED ORDER — DIAZEPAM 5 MG PO TABS
5.0000 mg | ORAL_TABLET | ORAL | Status: AC
  Administered 2014-01-06: 5 mg via ORAL
  Filled 2014-01-05: qty 1

## 2014-01-05 NOTE — Progress Notes (Signed)
ANTICOAGULATION CONSULT NOTE - Follow Up Consult  Pharmacy Consult for Heparin Indication: atrial fibrillation  Allergies  Allergen Reactions  . Other     Beer and Wine- swelling/headache/nausea    Patient Measurements: Height: 6\' 2"  (188 cm) Weight: 182 lb 8.7 oz (82.8 kg) IBW/kg (Calculated) : 82.2  Vital Signs: Temp: 98.7 F (37.1 C) (01/25 0458) Temp src: Oral (01/25 0458) BP: 100/55 mmHg (01/25 0458) Pulse Rate: 74 (01/25 0458)  Labs:  Recent Labs  01/03/14 0500 01/03/14 0800 01/04/14 0438 01/05/14 0455  HGB  --  12.7*  --   --   HCT  --  37.1*  --   --   PLT  --  258  --   --   HEPARINUNFRC 0.57  --  0.47 0.41  CREATININE 1.09  --  1.13 1.11    Estimated Creatinine Clearance: 62.7 ml/min (by C-G formula based on Cr of 1.11).  Assessment: 27 yoM admitted 1/19 with weakness, metastatic prostate cancer, pleural effusion, CAP, aortic stenosis, and CHF. S/p 2L thoracentesis on 1/20, pleural effusion cytology is pending. He was noted to have paroxysmal atrial fibrillation on telemetry. Pharmacy is consulted to dose IV heparin on 01/01/14.  SCr: stable at 1.1 with CrCl ~ 61 ml/min  CBC: Hgb is stable/improved and Plt remain WNL  Noted plan to transition to warfarin prior to discharge, but continue heparin alone for now, plans for heart cath at [facility] on Monday 1/26  No bleeding or complications per RN  Heparin level 0.41, remains stable within therapeutic range on infusion rate of 12.5 ml/hr.  Goal of Therapy:  Heparin level 0.3-0.7 units/ml Monitor platelets by anticoagulation protocol: Yes   Plan:   Continue heparin IV infusion at 1250 units/hr  Daily heparin level and CBC; continue to monitor H&H and platelets  Follow up long-term anticoagulation plans when appropriate.  Thank you for the consult. Johny Drilling, PharmD, BCPS Pager: [phone] Pharmacy: [phone] 01/05/2014 10:08 AM

## 2014-01-05 NOTE — Progress Notes (Signed)
[facility] PROGRESS NOTE  Lucas Green [id number] DOB: 1932-06-14 DOA: 12/30/2013 PCP: Teressa Lower, MD  Assessment/Plan: Weakness generalized, failure to thrive with moderate proteinuria malnutrition  - nutrition consult: protein supplementation per consult - supportive care with IV fluids, encourage PO intake: improving  - Elevated TSH. Awaiting results of free T3/4  Pleural effusion, left  - cytology from thoracentesis negative - s/p thoracentesis 1/20, diagnostic and therapeutic, oxygen support via nasal canula to keep O2 saturation above 90%: currently on room air   Aortic valve disorder - Cardiology on board and per their note they will transfer him to [place] on 01/06/14 for possible TAVR.  CAP (community acquired pneumonia)  - Discontinue azithromycin and rocephin 1/23. Afebrile and WBC's WNL. SOB most likely due to pleural effusion. - follow up blood cultures: no growth, resp culture: no growth  Hypertension  - continue metoprolol   Prostate cancer with bone metastases  - under GU and Dr. Hazeline Junker care  - Discussed with oncologist and currently recommendations are to address current cardiac problems before getting aggressive with prostate cancer management.  Hypokalemia  - resolved   Nausea and vomiting, dehydration  - likely due to prostate cancer. - resolved- Saline lock - unable to use zofran related to prolonged QT syndrome   Dyslipidemia  - continue statin therapy   Moderate protein-calorie malnutrition  - nutrition consulted and patient currently on resource protein supplement.  Asymmetric edema left leg more than right.  -Venous duplex negative for deep vein thrombosis  Code Status: DNR Family Communication: wife at bedside. Updated on plan of care, awaiting transfer to [facility]. Disposition Plan: Transfer to [facility] scheduled for 1/26 for further cardiac work-up and possible  TAVR.   Consultants:  Urology  Cardiology   Procedures:  US thoracentesis ASP Pleural space with IMG guide  Antibiotics:  Azithromycin (day 5/5)  Rocephin (day 5/5)  HPI/Subjective: Patient has no new complaints. Feeling better. No complaints of nausea currently with improved PO intake.  Objective: Filed Vitals:   01/05/14 1402  BP: 105/48  Pulse: 74  Temp: 97 F (36.1 C)  Resp: 18    Intake/Output Summary (Last 24 hours) at 01/05/14 1456 Last data filed at 01/05/14 1404  Gross per 24 hour  Intake   1100 ml  Output   2075 ml  Net   -975 ml   Filed Weights   01/03/14 0453 01/04/14 0454 01/05/14 0458  Weight: 84.1 kg (185 lb 6.5 oz) 83.598 kg (184 lb 4.8 oz) 82.8 kg (182 lb 8.7 oz)    Exam:   General:  Pt in NAD, Alert and awake  Cardiovascular: RRR, systolic murmur grade 3-4  Respiratory: no wheezes, breath sounds BL. On room air.  Abdomen: soft, NT, ND  Musculoskeletal: no cyanosis or clubbing   Data Reviewed: Basic Metabolic Panel:  Recent Labs Lab 01/01/14 0449 01/02/14 0507 01/03/14 0500 01/04/14 0438 01/05/14 0455  NA 141 141 139 139 138  K 3.5* 3.3* 3.5* 3.7 3.8  CL 105 103 98 99 97  CO2 21 25 26 27 27   GLUCOSE 116* 117* 114* 115* 116*  BUN 19 20 17 18  24*  CREATININE 1.04 1.11 1.09 1.13 1.11  CALCIUM 8.7 8.4 8.7 8.6 8.7   Liver Function Tests:  Recent Labs Lab 01/03/14 0500  AST 18  ALT 8  ALKPHOS 276*  BILITOT 0.4  PROT 6.2  ALBUMIN 3.0*   No results found for this basename: LIPASE, AMYLASE,  in the last 168  hours No results found for this basename: AMMONIA,  in the last 168 hours CBC:  Recent Labs Lab 12/30/13 1722 12/31/13 0455 01/01/14 0449 01/02/14 0507 01/03/14 0800  WBC 7.8 7.5 8.2 8.8 8.7  HGB 13.2 11.6* 11.8* 12.1* 12.7*  HCT 38.5* 33.8* 35.2* 36.6* 37.1*  MCV 92.3 93.1 94.1 94.1 93.5  PLT 325 327 308 287 258   Cardiac Enzymes: No results found for this basename: CKTOTAL, CKMB, CKMBINDEX,  TROPONINI,  in the last 168 hours BNP (last 3 results)  Recent Labs  12/30/13 2210 01/01/14 0449  PROBNP 15574.0* 13190.0*   CBG:  Recent Labs Lab 12/31/13 0738  GLUCAP 105*    Recent Results (from the past 240 hour(s))  CULTURE, BLOOD (ROUTINE X 2)     Status: None   Collection Time    12/30/13  7:55 PM      Result Value Range Status   Specimen Description BLOOD LEFT FOREARM   Final   Special Requests BOTTLES DRAWN AEROBIC ONLY 3ML   Final   Culture  Setup Time     Final   Value: 12/31/2013 01:10     Performed at [facility]   Culture     Final   Value:        BLOOD CULTURE RECEIVED NO GROWTH TO DATE CULTURE WILL BE HELD FOR 5 DAYS BEFORE ISSUING A FINAL NEGATIVE REPORT     Performed at [facility]   Report Status PENDING   Incomplete  CULTURE, BLOOD (ROUTINE X 2)     Status: None   Collection Time    12/30/13  8:00 PM      Result Value Range Status   Specimen Description BLOOD LEFT ARM   Final   Special Requests BOTTLES DRAWN AEROBIC AND ANAEROBIC 5ML   Final   Culture  Setup Time     Final   Value: 12/31/2013 01:10     Performed at [facility]   Culture     Final   Value:        BLOOD CULTURE RECEIVED NO GROWTH TO DATE CULTURE WILL BE HELD FOR 5 DAYS BEFORE ISSUING A FINAL NEGATIVE REPORT     Performed at [facility]   Report Status PENDING   Incomplete  CULTURE, BLOOD (ROUTINE X 2)     Status: None   Collection Time    12/30/13 10:10 PM      Result Value Range Status   Specimen Description BLOOD LEFT ARM   Final   Special Requests BOTTLES DRAWN AEROBIC ONLY 4CC   Final   Culture  Setup Time     Final   Value: 12/31/2013 01:10     Performed at [facility]   Culture     Final   Value:        BLOOD CULTURE RECEIVED NO GROWTH TO DATE CULTURE WILL BE HELD FOR 5 DAYS BEFORE ISSUING A FINAL NEGATIVE REPORT     Performed at [facility]   Report Status PENDING   Incomplete  CULTURE, BLOOD (ROUTINE X 2)      Status: None   Collection Time    12/30/13 10:15 PM      Result Value Range Status   Specimen Description BLOOD LEFT HAND   Final   Special Requests BOTTLES DRAWN AEROBIC ONLY 2CC   Final   Culture  Setup Time     Final   Value: 12/31/2013 01:10     Performed  at [facility]     Final   Value:        BLOOD CULTURE RECEIVED NO GROWTH TO DATE CULTURE WILL BE HELD FOR 5 DAYS BEFORE ISSUING A FINAL NEGATIVE REPORT     Performed at [facility]   Report Status PENDING   Incomplete  BODY FLUID CULTURE     Status: None   Collection Time    12/31/13  2:05 PM      Result Value Range Status   Specimen Description PLEURAL   Final   Special Requests NONE   Final   Gram Stain     Final   Value: NO WBC SEEN     NO ORGANISMS SEEN     Performed at [facility]   Culture     Final   Value: NO GROWTH 3 DAYS     Performed at [facility]   Report Status 01/04/2014 FINAL   Final  CULTURE, EXPECTORATED SPUTUM-ASSESSMENT     Status: None   Collection Time    12/31/13  5:59 PM      Result Value Range Status   Specimen Description SPUTUM   Final   Special Requests NONE   Final   Sputum evaluation     Final   Value: THIS SPECIMEN IS ACCEPTABLE. RESPIRATORY CULTURE REPORT TO FOLLOW.   Report Status 12/31/2013 FINAL   Final  CULTURE, RESPIRATORY (NON-EXPECTORATED)     Status: None   Collection Time    12/31/13  5:59 PM      Result Value Range Status   Specimen Description SPUTUM   Final   Special Requests NONE   Final   Gram Stain     Final   Value: NO WBC SEEN     NO SQUAMOUS EPITHELIAL CELLS SEEN     NO ORGANISMS SEEN     Performed at [facility]   Culture     Final   Value: NORMAL OROPHARYNGEAL FLORA     Performed at [facility]   Report Status 01/03/2014 FINAL   Final     Studies: No results found.  Scheduled Meds: . feeding supplement (PRO-STAT SUGAR FREE 64)  30 mL Oral BID WC  . feeding supplement (RESOURCE BREEZE)  1  Container Oral BID BM  . furosemide  40 mg Intravenous BID  . metoprolol succinate  25 mg Oral Daily  . multivitamin with minerals  1 tablet Oral Daily  . pantoprazole  40 mg Oral Daily  . potassium chloride  20 mEq Oral BID  . protein supplement  1 scoop Oral BID WC  . simvastatin  20 mg Oral q1800  . sodium chloride  3 mL Intravenous Q12H   Continuous Infusions: . heparin 1,250 Units/hr (01/05/14 8921)    Principal Problem:   Weakness Active Problems:   Hypertension   Prostate cancer   Metastasis from malignant tumor of prostate   Bone metastases   Pleural effusion, left   Hypokalemia   CAP (community acquired pneumonia)   Nausea and vomiting   Dyslipidemia   Failure to thrive   Moderate protein-calorie malnutrition   Aortic valve disorders   Severe aortic stenosis   Atrial fibrillation   Chronic diastolic congestive heart failure   Acute on chronic diastolic heart failure    Time spent: > 40 minutes    Larwance Sachs  [facility] Pager [phone]. If 7PM-7AM, please contact night-coverage at [email or web address], password [facility] 01/05/2014,  2:56 PM  LOS: 6 days

## 2014-01-05 NOTE — Progress Notes (Signed)
Subjective: Patient reports : No pain. No discomfort from Foley.  Objective: Vital signs in last 24 hours: Temp:  [97.4 F (36.3 C)-98.7 F (37.1 C)] 98.7 F (37.1 C) (01/25 0458) Pulse Rate:  [72-79] 74 (01/25 0458) Resp:  [18] 18 (01/25 0458) BP: (92-110)/(51-59) 100/55 mmHg (01/25 0458) SpO2:  [95 %-99 %] 95 % (01/25 0458) Weight:  [82.8 kg (182 lb 8.7 oz)] 82.8 kg (182 lb 8.7 oz) (01/25 0458)  Intake/Output from previous day: 01/24 0701 - 01/25 0700 In: 620 [P.O.:120; I.V.:500] Out: 2300 [Urine:2300] Intake/Output this shift:    Physical Exam:  General:Comfortable Foley draining clear urine. Scrotum: Normal.  Testicles: OK.  No swelling  Lab Results:  Recent Labs  01/03/14 0800  HGB 12.7*  HCT 37.1*   BMET  Recent Labs  01/04/14 0438 01/05/14 0455  NA 139 138  K 3.7 3.8  CL 99 97  CO2 27 27  GLUCOSE 115* 116*  BUN 18 24*  CREATININE 1.13 1.11  CALCIUM 8.6 8.7   No results found for this basename: LABPT, INR,  in the last 72 hours No results found for this basename: LABURIN,  in the last 72 hours Results for orders placed during the hospital encounter of 12/30/13  CULTURE, BLOOD (ROUTINE X 2)     Status: None   Collection Time    12/30/13  7:55 PM      Result Value Range Status   Specimen Description BLOOD LEFT FOREARM   Final   Special Requests BOTTLES DRAWN AEROBIC ONLY 3ML   Final   Culture  Setup Time     Final   Value: 12/31/2013 01:10     Performed at [facility]   Culture     Final   Value:        BLOOD CULTURE RECEIVED NO GROWTH TO DATE CULTURE WILL BE HELD FOR 5 DAYS BEFORE ISSUING A FINAL NEGATIVE REPORT     Performed at [facility]   Report Status PENDING   Incomplete  CULTURE, BLOOD (ROUTINE X 2)     Status: None   Collection Time    12/30/13  8:00 PM      Result Value Range Status   Specimen Description BLOOD LEFT ARM   Final   Special Requests BOTTLES DRAWN AEROBIC AND ANAEROBIC 5ML   Final   Culture  Setup  Time     Final   Value: 12/31/2013 01:10     Performed at [facility]   Culture     Final   Value:        BLOOD CULTURE RECEIVED NO GROWTH TO DATE CULTURE WILL BE HELD FOR 5 DAYS BEFORE ISSUING A FINAL NEGATIVE REPORT     Performed at [facility]   Report Status PENDING   Incomplete  CULTURE, BLOOD (ROUTINE X 2)     Status: None   Collection Time    12/30/13 10:10 PM      Result Value Range Status   Specimen Description BLOOD LEFT ARM   Final   Special Requests BOTTLES DRAWN AEROBIC ONLY 4CC   Final   Culture  Setup Time     Final   Value: 12/31/2013 01:10     Performed at [facility]   Culture     Final   Value:        BLOOD CULTURE RECEIVED NO GROWTH TO DATE CULTURE WILL BE HELD FOR 5 DAYS BEFORE ISSUING A FINAL NEGATIVE REPORT  Performed at [facility]   Report Status PENDING   Incomplete  CULTURE, BLOOD (ROUTINE X 2)     Status: None   Collection Time    12/30/13 10:15 PM      Result Value Range Status   Specimen Description BLOOD LEFT HAND   Final   Special Requests BOTTLES DRAWN AEROBIC ONLY 2CC   Final   Culture  Setup Time     Final   Value: 12/31/2013 01:10     Performed at [facility]   Culture     Final   Value:        BLOOD CULTURE RECEIVED NO GROWTH TO DATE CULTURE WILL BE HELD FOR 5 DAYS BEFORE ISSUING A FINAL NEGATIVE REPORT     Performed at [facility]   Report Status PENDING   Incomplete  BODY FLUID CULTURE     Status: None   Collection Time    12/31/13  2:05 PM      Result Value Range Status   Specimen Description PLEURAL   Final   Special Requests NONE   Final   Gram Stain     Final   Value: NO WBC SEEN     NO ORGANISMS SEEN     Performed at [facility]   Culture     Final   Value: NO GROWTH 3 DAYS     Performed at [facility]   Report Status 01/04/2014 FINAL   Final  CULTURE, EXPECTORATED SPUTUM-ASSESSMENT     Status: None   Collection Time    12/31/13  5:59 PM      Result  Value Range Status   Specimen Description SPUTUM   Final   Special Requests NONE   Final   Sputum evaluation     Final   Value: THIS SPECIMEN IS ACCEPTABLE. RESPIRATORY CULTURE REPORT TO FOLLOW.   Report Status 12/31/2013 FINAL   Final  CULTURE, RESPIRATORY (NON-EXPECTORATED)     Status: None   Collection Time    12/31/13  5:59 PM      Result Value Range Status   Specimen Description SPUTUM   Final   Special Requests NONE   Final   Gram Stain     Final   Value: NO WBC SEEN     NO SQUAMOUS EPITHELIAL CELLS SEEN     NO ORGANISMS SEEN     Performed at [facility]   Culture     Final   Value: NORMAL OROPHARYNGEAL FLORA     Performed at [facility]   Report Status 01/03/2014 FINAL   Final    Studies/Results: No results found.  Assessment/Plan:  Metastatic prostate cancer.  Aortic stenosis  Will continue to follow  Plan as per cardiology   LOS: 6 days   Hanley Ben 01/05/2014, 9:06 AM

## 2014-01-05 NOTE — Progress Notes (Signed)
SUBJECTIVE: The patient is comfortable sleeping in 20 degrees incline only. SOB has significantly improved.   OBJECTIVE:   Vitals:   Filed Vitals:   01/04/14 0930 01/04/14 1330 01/04/14 2050 01/05/14 0458  BP: 110/59 92/51 106/58 100/55  Pulse: 79 72 75 74  Temp:  97.4 F (36.3 C) 97.4 F (36.3 C) 98.7 F (37.1 C)  TempSrc:  Oral Oral Oral  Resp:  18 18 18   Height:      Weight:    182 lb 8.7 oz (82.8 kg)  SpO2: 96% 99% 97% 95%   I&O's:    Intake/Output Summary (Last 24 hours) at 01/05/14 0735 Last data filed at 01/04/14 2200  Gross per 24 hour  Intake    620 ml  Output   2300 ml  Net  -1680 ml   . feeding supplement (PRO-STAT SUGAR FREE 64)  30 mL Oral BID WC  . feeding supplement (RESOURCE BREEZE)  1 Container Oral BID BM  . furosemide  40 mg Intravenous BID  . metoprolol succinate  25 mg Oral Daily  . multivitamin with minerals  1 tablet Oral Daily  . pantoprazole  40 mg Oral Daily  . potassium chloride  20 mEq Oral BID  . protein supplement  1 scoop Oral BID WC  . simvastatin  20 mg Oral q1800  . sodium chloride  3 mL Intravenous Q12H   TELEMETRY: Reviewed telemetry pt in NSR:  PHYSICAL EXAM General: Well developed, well nourished, in no acute distress Head: Eyes PERRLA, No xanthomas.   Normal cephalic and atramatic  Lungs:   Few crackles at bases Heart:   HRRR S1 S2 Pulses are 2+ & equal. Abdomen: Bowel sounds are positive, abdomen soft and non-tender without masses Extremities:   No clubbing, cyanosis or edema.  DP +1 Neuro: Alert and oriented X 3. Psych:  Good affect, responds appropriately   LABS: Basic Metabolic Panel:  Recent Labs  01/04/14 0438 01/05/14 0455  NA 139 138  K 3.7 3.8  CL 99 97  CO2 27 27  GLUCOSE 115* 116*  BUN 18 24*  CREATININE 1.13 1.11  CALCIUM 8.6 8.7   Liver Function Tests:  Recent Labs  01/03/14 0500  AST 18  ALT 8  ALKPHOS 276*  BILITOT 0.4  PROT 6.2  ALBUMIN 3.0*   No results found for this basename:  LIPASE, AMYLASE,  in the last 72 hours CBC:  Recent Labs  01/03/14 0800  WBC 8.7  HGB 12.7*  HCT 37.1*  MCV 93.5  PLT 258    Recent Labs  01/04/14 0438  HGBA1C 5.8*    Recent Labs  01/04/14 0438  TSH 7.102*   Coag Panel:   Lab Results  Component Value Date   INR 1.17 01/01/2014   INR 1.05 03/29/2013    RADIOLOGY: Dg Chest 1 View  12/31/2013   CLINICAL DATA:  Post left thoracentesis  EXAM: CHEST - 1 VIEW  COMPARISON:  12/1913  FINDINGS: There is significant decreased opacity on the left reflecting evacuation of most of the left pleural effusion. There is no pneumothorax. Opacity at the left lung base may reflect infiltrate or atelectasis.  There is vascular congestion centrally throughout the right lung with mild diffuse hazy opacity similar to the prior study.  IMPRESSION: No pneumothorax following thoracentesis on the left. Marked reduction in left pleural effusion.   Electronically Signed   By: Lajean Manes M.D.   On: 12/31/2013 14:32     Assessment/Plan:  1 acute combined systolic/diastolic congestive heart failure-the patient remains mildly volume overloaded on examination but much improved. This is related to progressive aortic stenosis and reduced LV function. Continue Lasix 40 mg twice a day and follow renal function. Creatinine is stable. Supplement K. Albumin 3.   2 aortic stenosis-the patient has severe aortic stenosis on echo with reduced LV function. Based on oncology's notes the patient's prognosis is greater than one year. Dr. Roxy Manns evaluated patient and he feels patient may be a good candidate for TAVR.  We will arrange transfer to [facility] Monday for cardiac catheterization as well as CTA of his thoracic and abdominal aorta. Continue diuresis over the weekend.   3 paroxysmal atrial fibrillation-the patient was noted to have paroxysmal atrial fibrillation on telemetry previously. Continue Toprol. He has multiple embolic risk factors including prior  stroke, age greater than 12, congestive heart failure, hypertension. Continue heparin for now. We will transition to Coumadin prior to discharge once it is clear all procedures complete.  Patient would not be a candidate for NOAC given valvular heart disease.  TSH is high, we will check fT3 and fT4.  4 metastatic prostate cancer-Cytology from thoracentesis negative; management per oncology; based on their opinion, prognosis greater than one year.   5 CM - most likely from aortic stenosis. Continue beta blocker. No ACE inhibitor as his blood pressure is borderline and he had severe aortic stenosis.    Ena Dawley, Lemmie Evens, MD  01/05/2014  7:35 AM

## 2014-01-05 NOTE — Progress Notes (Signed)
NUTRITION FOLLOW UP Consult for protein supplement needs. Intervention:    Continue Regular diet as tolerated with high protein HS snack.  Discussed metalic taste and ideas to help with taste alteration.  Continue Resource Breeze bid (250 kcal, 9 gm protein each.  Continue Prostat bid (100 kcal, 15 gm protein each)  Continue Beneprotein 1 scoop bid   RD to continue to monitor  Nutrition Dx:   Inadequate oral intake related to abd pain/early satiety as evidenced by PO intake <75%.   Goal:   Pt to meet >/= 90% of their estimated nutrition needs    Monitor:   Total protein/energy intake, labs, weights, GI profile  Assessment:   1/20: -Pt with feelings of early satiety for three weeks with has significantly inhibited PO intake. Is only able to consume small amounts of meals, approximately 25%. Attributes lack of appetite to radiation treatments  -Encouraged snacks/frequent meals and consuming liquids between meals to assist in preventing early satiety  -Reported feelings of nausea/vomiting that also inhibit PO intake. Admitted with dehydration  -Pt reported weight loss has occurred but was unable to quantify amount. Has lost 10 lbs in one month, and 20 lbs in 3 months per previous medical records  -Assisted pt in ordering meals. Will place on room service assist program to ensure pt's choices/preferences are honored  -Declined use of supplementation as he feels Ensure/Boost are "too heavy" and fill him up too quickly.  -Was willing to try to incorporate snack of yogurt and skim milk. Declined use of whole milk as the full fat products also fill him up quickly.  -MD noted pt with prostate cancer with bone mets and possible new lung mets  -Pt with visible signs of moderate wasting in occipital, upper arm and temple region.  -ADDENDUM: Spoke with pt's wife later today, pt has been complying with BRAT diet to assist with GI intolerances, but continues to only take 1-2 bites of meals. Pt  disliked vanilla and chocolate flavors of nutrition supplement, wife was willing to encourage strawberry flavor  -Wife is was also in agreement for pt to try Beneprotein and Pro-Stat protein supplements. Low volume of supplements may be beneficial for pt's early satiety   1/23: - Pt continues with decreased appetite, consuming >25% of trays - Finds some nausea relief by eating very small bites and pacing meal - Has been able to tolerate [employer] over Ensure. Will add to supplement order -Consumes one Pro-Stat and one Beneprotein daily. Does not enjoy taste very much, but understand their importance. Has been mixing with cold juices/applesauces. Offered additional suggestions to improve their taste. Provided pt's wife with supplement recipe booklet for additional ideas - Pt noted may start him on Reglan tomorrow. Wife reported MD researching potential anti-nausea medications that would be the least detrimental to pt's cardiovascular issues -Accepted educational materials concerning n/v, abd pain -Pt enjoying PM nourishment of yogurt  1/23 Noted patient for transfer to [facility] Monday for cardiac catheterization as well as CTA of the thoracic and abdominal aorta.   Diet has advanced to regular with HS snack. RB bid, Prostat bid, and Beneprotein bid.  Patient is tolerating diet and supplements with better intake.  Poor appetite and does not always like supplements but is trying to eat.  Complains of metallic taste.  Height: Ht Readings from Last 1 Encounters:  12/30/13 6\' 2"  (1.88 m)    Weight Status:   Wt Readings from Last 1 Encounters:  01/05/14 182 lb 8.7 oz (82.8  kg)  12/31/13 200 lbs  Re-estimated needs:  Kcal: 8099-8338  Protein:110-120 gram  Fluid: 2700 ml/daily  Skin: WDL  Diet Order: General   Intake/Output Summary (Last 24 hours) at 01/05/14 1241 Last data filed at 01/05/14 1143  Gross per 24 hour  Intake   1100 ml  Output   3275 ml  Net  -2175 ml    Last BM:  1/22   Labs:   Recent Labs Lab 01/03/14 0500 01/04/14 0438 01/05/14 0455  NA 139 139 138  K 3.5* 3.7 3.8  CL 98 99 97  CO2 26 27 27   BUN 17 18 24*  CREATININE 1.09 1.13 1.11  CALCIUM 8.7 8.6 8.7  GLUCOSE 114* 115* 116*    CBG (last 3)  No results found for this basename: GLUCAP,  in the last 72 hours  Scheduled Meds: . feeding supplement (PRO-STAT SUGAR FREE 64)  30 mL Oral BID WC  . feeding supplement (RESOURCE BREEZE)  1 Container Oral BID BM  . furosemide  40 mg Intravenous BID  . metoprolol succinate  25 mg Oral Daily  . multivitamin with minerals  1 tablet Oral Daily  . pantoprazole  40 mg Oral Daily  . potassium chloride  20 mEq Oral BID  . protein supplement  1 scoop Oral BID WC  . simvastatin  20 mg Oral q1800  . sodium chloride  3 mL Intravenous Q12H    Continuous Infusions: . heparin 1,250 Units/hr (01/05/14 0852)    Antonieta Iba, RD, LDN Clinical Inpatient Dietitian Pager:  [phone] Weekend and after hours pager:  [phone]

## 2014-01-06 ENCOUNTER — Encounter (HOSPITAL_COMMUNITY)
Admission: EM | Disposition: A | Discharge status: Skilled Nursing Facility | Payer: Self-pay | Source: Home / Self Care | Attending: Family Medicine

## 2014-01-06 ENCOUNTER — Inpatient Hospital Stay (HOSPITAL_COMMUNITY): Payer: Medicare Other

## 2014-01-06 ENCOUNTER — Ambulatory Visit (HOSPITAL_COMMUNITY): Admit: 2014-01-06 | Payer: Self-pay | Admitting: Cardiovascular Disease

## 2014-01-06 DIAGNOSIS — I2510 Atherosclerotic heart disease of native coronary artery without angina pectoris: Secondary | ICD-10-CM

## 2014-01-06 DIAGNOSIS — I359 Nonrheumatic aortic valve disorder, unspecified: Secondary | ICD-10-CM

## 2014-01-06 HISTORY — PX: LEFT AND RIGHT HEART CATHETERIZATION WITH CORONARY ANGIOGRAM: SHX5449

## 2014-01-06 LAB — CULTURE, BLOOD (ROUTINE X 2)
CULTURE: NO GROWTH
CULTURE: NO GROWTH
Culture: NO GROWTH
Culture: NO GROWTH

## 2014-01-06 LAB — POCT I-STAT 3, VENOUS BLOOD GAS (G3P V)
ACID-BASE EXCESS: 6 mmol/L — AB (ref 0.0–2.0)
Acid-Base Excess: 5 mmol/L — ABNORMAL HIGH (ref 0.0–2.0)
BICARBONATE: 29.6 meq/L — AB (ref 20.0–24.0)
Bicarbonate: 30.6 mEq/L — ABNORMAL HIGH (ref 20.0–24.0)
O2 SAT: 61 %
O2 SAT: 65 %
PCO2 VEN: 40.5 mmHg — AB (ref 45.0–50.0)
PO2 VEN: 31 mmHg (ref 30.0–45.0)
TCO2: 31 mmol/L (ref 0–100)
TCO2: 32 mmol/L (ref 0–100)
pCO2, Ven: 43 mmHg — ABNORMAL LOW (ref 45.0–50.0)
pH, Ven: 7.46 — ABNORMAL HIGH (ref 7.250–7.300)
pH, Ven: 7.471 — ABNORMAL HIGH (ref 7.250–7.300)
pO2, Ven: 30 mmHg (ref 30.0–45.0)

## 2014-01-06 LAB — POCT I-STAT 3, ART BLOOD GAS (G3+)
ACID-BASE EXCESS: 7 mmol/L — AB (ref 0.0–2.0)
Acid-Base Excess: 5 mmol/L — ABNORMAL HIGH (ref 0.0–2.0)
BICARBONATE: 29.2 meq/L — AB (ref 20.0–24.0)
Bicarbonate: 29.6 mEq/L — ABNORMAL HIGH (ref 20.0–24.0)
O2 SAT: 95 %
O2 Saturation: 92 %
PCO2 ART: 38.7 mmHg (ref 35.0–45.0)
PO2 ART: 58 mmHg — AB (ref 80.0–100.0)
TCO2: 30 mmol/L (ref 0–100)
TCO2: 31 mmol/L (ref 0–100)
pCO2 arterial: 35.3 mmHg (ref 35.0–45.0)
pH, Arterial: 7.485 — ABNORMAL HIGH (ref 7.350–7.450)
pH, Arterial: 7.531 — ABNORMAL HIGH (ref 7.350–7.450)
pO2, Arterial: 65 mmHg — ABNORMAL LOW (ref 80.0–100.0)

## 2014-01-06 LAB — HEPARIN LEVEL (UNFRACTIONATED): HEPARIN UNFRACTIONATED: 0.56 [IU]/mL (ref 0.30–0.70)

## 2014-01-06 LAB — BASIC METABOLIC PANEL
BUN: 24 mg/dL — ABNORMAL HIGH (ref 6–23)
CHLORIDE: 96 meq/L (ref 96–112)
CO2: 29 meq/L (ref 19–32)
Calcium: 9.3 mg/dL (ref 8.4–10.5)
Creatinine, Ser: 1.13 mg/dL (ref 0.50–1.35)
GFR calc non Af Amer: 60 mL/min — ABNORMAL LOW (ref >90)
GFR, EST AFRICAN AMERICAN: 69 mL/min — AB (ref >90)
Glucose, Bld: 114 mg/dL — ABNORMAL HIGH (ref 70–99)
Potassium: 3.9 mEq/L (ref 3.7–5.3)
SODIUM: 139 meq/L (ref 137–147)

## 2014-01-06 LAB — BLOOD GAS, ARTERIAL
Acid-Base Excess: 5.5 mmol/L — ABNORMAL HIGH (ref 0.0–2.0)
Bicarbonate: 28.4 mEq/L — ABNORMAL HIGH (ref 20.0–24.0)
DRAWN BY: 244090
O2 CONTENT: 2 L/min
O2 Saturation: 97.6 %
PCO2 ART: 36.3 mmHg (ref 35.0–45.0)
PO2 ART: 92.8 mmHg (ref 80.0–100.0)
Patient temperature: 98.6
TCO2: 24.1 mmol/L (ref 0–100)
pH, Arterial: 7.504 — ABNORMAL HIGH (ref 7.350–7.450)

## 2014-01-06 LAB — POCT ACTIVATED CLOTTING TIME: Activated Clotting Time: 110 seconds

## 2014-01-06 SURGERY — LEFT AND RIGHT HEART CATHETERIZATION WITH CORONARY ANGIOGRAM
Anesthesia: LOCAL

## 2014-01-06 MED ORDER — IOHEXOL 350 MG/ML SOLN
100.0000 mL | Freq: Once | INTRAVENOUS | Status: AC | PRN
  Administered 2014-01-06: 100 mL via INTRAVENOUS

## 2014-01-06 MED ORDER — ONDANSETRON HCL 4 MG/2ML IJ SOLN
4.0000 mg | Freq: Four times a day (QID) | INTRAMUSCULAR | Status: DC | PRN
  Filled 2014-01-06: qty 2

## 2014-01-06 MED ORDER — HEPARIN (PORCINE) IN NACL 2-0.9 UNIT/ML-% IJ SOLN
INTRAMUSCULAR | Status: AC
  Filled 2014-01-06: qty 1500

## 2014-01-06 MED ORDER — MIDAZOLAM HCL 2 MG/2ML IJ SOLN
INTRAMUSCULAR | Status: AC
  Filled 2014-01-06: qty 2

## 2014-01-06 MED ORDER — HEPARIN (PORCINE) IN NACL 100-0.45 UNIT/ML-% IJ SOLN
1400.0000 [IU]/h | INTRAMUSCULAR | Status: AC
  Administered 2014-01-07: 13:00:00 1300 [IU]/h via INTRAVENOUS
  Administered 2014-01-07: 1250 [IU]/h via INTRAVENOUS
  Administered 2014-01-08 – 2014-01-09 (×3): 1300 [IU]/h via INTRAVENOUS
  Administered 2014-01-10 – 2014-01-13 (×5): 1400 [IU]/h via INTRAVENOUS
  Filled 2014-01-06 (×14): qty 250

## 2014-01-06 MED ORDER — LIDOCAINE HCL (PF) 1 % IJ SOLN
INTRAMUSCULAR | Status: AC
  Filled 2014-01-06: qty 30

## 2014-01-06 MED ORDER — SODIUM CHLORIDE 0.9 % IV SOLN: 250.0000 mL | INTRAVENOUS | Status: DC | PRN

## 2014-01-06 MED ORDER — SODIUM CHLORIDE 0.9 % IJ SOLN: 3.0000 mL | INTRAMUSCULAR | Status: DC | PRN

## 2014-01-06 MED ORDER — NITROGLYCERIN 0.2 MG/ML ON CALL CATH LAB
INTRAVENOUS | Status: AC
  Filled 2014-01-06: qty 1

## 2014-01-06 MED ORDER — SODIUM CHLORIDE 0.9 % IV SOLN
1.0000 mL/kg/h | INTRAVENOUS | Status: AC
  Administered 2014-01-06: 1 mL/kg/h via INTRAVENOUS

## 2014-01-06 MED ORDER — HEPARIN (PORCINE) IN NACL 100-0.45 UNIT/ML-% IJ SOLN
1250.0000 [IU]/h | INTRAMUSCULAR | Status: DC
  Administered 2014-01-06: 1250 [IU]/h via INTRAVENOUS
  Filled 2014-01-06 (×2): qty 250

## 2014-01-06 MED ORDER — SODIUM CHLORIDE 0.9 % IJ SOLN
3.0000 mL | Freq: Two times a day (BID) | INTRAMUSCULAR | Status: DC
  Administered 2014-01-06: 3 mL via INTRAVENOUS

## 2014-01-06 MED ORDER — ASPIRIN 81 MG PO CHEW
81.0000 mg | CHEWABLE_TABLET | Freq: Once | ORAL | Status: AC
  Administered 2014-01-06: 81 mg via ORAL

## 2014-01-06 MED ORDER — SODIUM CHLORIDE 0.9 % IJ SOLN: 3.0000 mL | Freq: Two times a day (BID) | INTRAMUSCULAR | Status: DC

## 2014-01-06 MED ORDER — ACETAMINOPHEN 325 MG PO TABS: 650.0000 mg | ORAL_TABLET | ORAL | Status: DC | PRN

## 2014-01-06 MED ORDER — ALBUTEROL SULFATE (2.5 MG/3ML) 0.083% IN NEBU
2.5000 mg | INHALATION_SOLUTION | Freq: Once | RESPIRATORY_TRACT | Status: AC
  Administered 2014-01-06: 2.5 mg via RESPIRATORY_TRACT

## 2014-01-06 MED ORDER — FENTANYL CITRATE 0.05 MG/ML IJ SOLN
INTRAMUSCULAR | Status: AC
  Filled 2014-01-06: qty 2

## 2014-01-06 NOTE — Progress Notes (Signed)
ANTICOAGULATION CONSULT NOTE - Follow Up Consult  Pharmacy Consult for Heparin Indication: atrial fibrillation  Allergies  Allergen Reactions  . Other     Beer and Wine- swelling/headache/nausea    Patient Measurements: Height: 6\' 2"  (188 cm) Weight: 178 lb 9.2 oz (81 kg) IBW/kg (Calculated) : 82.2  Vital Signs: Temp: 98.1 F (36.7 C) (01/26 0619) Temp src: Oral (01/26 0619) BP: 114/60 mmHg (01/26 0619) Pulse Rate: 73 (01/26 0619)  Labs:  Recent Labs  01/03/14 0800 01/04/14 0438 01/05/14 0455 01/06/14 0508  HGB 12.7*  --   --   --   HCT 37.1*  --   --   --   PLT 258  --   --   --   HEPARINUNFRC  --  0.47 0.41 0.56  CREATININE  --  1.13 1.11 1.13    Estimated Creatinine Clearance: 60.7 ml/min (by C-G formula based on Cr of 1.13).  Assessment: 85 yoM admitted 1/19 with weakness, metastatic prostate cancer, pleural effusion, CAP, aortic stenosis, and CHF. S/p 2L thoracentesis on 1/20, pleural effusion cytology is pending. He was noted to have paroxysmal atrial fibrillation on telemetry. Pharmacy is consulted to dose IV heparin on 01/01/14.  SCr: stable at 1.1 with CrCl ~ 61 ml/min  CBC: Hgb not checked since 1/24  Noted plan to transition to warfarin prior to discharge, but continue heparin alone for now, plans for heart cath at [facility] on Monday 1/26  No bleeding or complications per RN  Heparin level remains therapeutic with nfusion rate of 12.5 ml/hr.  Goal of Therapy:  Heparin level 0.3-0.7 units/ml Monitor platelets by anticoagulation protocol: Yes   Plan:   Continue heparin IV infusion at 1250 units/hr  Daily heparin level  Order daily CBC since was not ordered previously  Follow up long-term anticoagulation plans when appropriate.   Adrian Saran, PharmD, BCPS Pager [phone] 01/06/2014 7:49 AM

## 2014-01-06 NOTE — Progress Notes (Signed)
Events noted. Patient with critical AS and history of prostate cancer.  I agree with the current management and plan. I agree with Dr. Alvy Bimler. Although he has advanced prostate cancer, his prognosis is reasonable to consider valve surgery.  His life expectancy is more than one year.  [facility] and possible surgery.  Please call with questions and will arrange out patient follow up upon his discharge.

## 2014-01-06 NOTE — Consult Note (Signed)
CARDIOLOGY CONSULT NOTE  Patient ID: Lucas Green, MRN: [id number], DOB/AGE: 78-Aug-1933 78 y.o. Admit date: 12/30/2013 Date of Consult: 01/06/2014  Primary Physician: Teressa Lower, MD Primary Cardiologist: Nolon Rod) Referring Physician: Dr Stanford Breed  Chief Complaint: shortness of breath Reason for Consultation: severe aortic stenosis  HPI: 80 y.o. male w/ PMHx significant for prostate CA with bone mets who has been hospitalized for congestive heart failure since 1/19. The patient is a retired [employer] operational officer. He describes several months of progressive dyspnea, leg swelling, nausea, generalized weakness, and anorexia. He was noted to have clinical signs of CHF as well as radiographic evidence with a left pleural effusion and pulmonary edema on CXR. An echo showed severe global LV dysfunction and severe aortic stenosis.  The patient has a heart murmur dating back many years. He has never undergone formal cardiac evaluation. He was well until last Spring when he sustained a pontine stroke. However, he reports a good recovery from this.   He had developed extreme weakness prior to hospitalization, but reports dramatic improvement with treatment of his heart failure. He denies chest pain or pressure, orthopnea, PND, or syncope.   Past Medical History  Diagnosis Date  . Hypertension   . GERD (gastroesophageal reflux disease)   . Dyslipidemia   . Aortic stenosis   . Prostate cancer 09/2009  . 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 Hearing loss   . Incontinence of urine   . 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 Degenerative arthritis     osteoarthritis  . Stroke 03/29/13  . Hx of radiation therapy 09/30/13- 10/11/13    right hip/ischium, 3000 cGy 10 sessions  . Esophageal stricture   . Hiatal hernia   . Diverticulosis   . Moderate protein-calorie malnutrition 12/30/2013  . Failure to thrive 12/30/2013  . Pleural effusion, left 12/30/2013  . Severe aortic stenosis   .  Atrial fibrillation   . Chronic diastolic congestive heart failure   . Acute on chronic diastolic heart failure       Surgical History:  Past Surgical History  Procedure Laterality Date  . Tonsillectomy  1938  . Cryosurgery prostate  09/2009    prostate cancer, Gleason 7     Home Meds: Prior to Admission medications   Medication Sig Start Date End Date Taking? Authorizing Provider  clopidogrel (PLAVIX) 75 MG tablet Take 1 tablet (75 mg total) by mouth daily with breakfast. 04/12/13  Yes Ivan Anchors Love, PA-C  ibuprofen (ADVIL,MOTRIN) 800 MG tablet Take 800 mg by mouth every 8 (eight) hours as needed for pain.   Yes Historical Provider, MD  metoprolol succinate (TOPROL-XL) 50 MG 24 hr tablet Take 25 mg by mouth daily. Take with or immediately following a meal.   Yes Historical Provider, MD  Multiple Vitamin (MULTIVITAMIN WITH MINERALS) TABS Take 1 tablet by mouth daily.   Yes Historical Provider, MD  pantoprazole (PROTONIX) 40 MG tablet Take 40 mg by mouth daily. 05/10/13  Yes Historical Provider, MD  simvastatin (ZOCOR) 20 MG tablet Take 1 tablet (20 mg total) by mouth daily at 6 PM. 04/12/13  Yes Ivan Anchors Love, PA-C  bicalutamide (CASODEX) 50 MG tablet  10/24/13   Historical Provider, MD  Histrelin Acetate ([facility] [place]) Inject into the skin.    Historical Provider, MD  levofloxacin (LEVAQUIN) 750 MG tablet  12/13/13   Historical Provider, MD  megestrol (MEGACE) 40 MG tablet  12/16/13   Historical Provider, MD  prochlorperazine (  COMPAZINE) 10 MG tablet  10/09/13   Historical Provider, MD    Inpatient Medications:  . feeding supplement (RESOURCE BREEZE)  1 Container Oral BID BM  . metoprolol succinate  25 mg Oral Daily  . multivitamin with minerals  1 tablet Oral Daily  . pantoprazole  40 mg Oral Daily  . potassium chloride  20 mEq Oral BID  . protein supplement  1 scoop Oral BID WC  . simvastatin  20 mg Oral q1800   . sodium chloride    . [START ON 01/07/2014] heparin      Allergies:    Allergies  Allergen Reactions  . Other     Beer and Wine- swelling/headache/nausea    History   Social History  . Marital Status: Married    Spouse Name: N/A    Number of Children: 1  . Years of Education: MD   Occupational History  . Not on file.   Social History Main Topics  . Smoking status: Never Smoker   . Smokeless tobacco: Never Used  . Alcohol Use: Yes     Comment: occassionally  . Drug Use: No  . Sexual Activity: Not on file   Other Topics Concern  . Not on file   Social History Narrative  . No narrative on file     Family History  Problem Relation Age of Onset  . Heart disease Father   . Cancer Father     prostate  . Heart attack Father   . Cancer - Colon Mother   . Thyroid disease Mother   . Stroke Maternal Grandfather   . Hypertension Maternal Grandfather   . Stroke Paternal Grandfather   . Hypertension Paternal Grandfather   . Cancer - Lung Cousin      Review of Systems: General: negative for chills, fever, night sweats, positive for weight loss.  ENT: negative for rhinorrhea or epistaxis Cardiovascular:see HPI Dermatological: negative for rash Respiratory: negative for cough or wheezing, positive for shortness of breath GI: negative for vomiting, diarrhea, bright red blood per rectum, melena, or hematemesis. Positive for nausea, anorexia. GU: no hematuria, urgency, or frequency Neurologic: negative for visual changes, syncope, headache, or dizziness Heme: no easy bruising or bleeding, positive for prostate CA Endo: negative for excessive thirst, thyroid disorder, or flushing Musculoskeletal: negative for joint pain or swelling, negative for myalgias All other systems reviewed and are otherwise negative except as noted above.  Physical Exam: Blood pressure 113/52, pulse 68, temperature 97.5 F (36.4 C), temperature source Oral, resp. rate 16, height 6\' 2"  (1.88 m), weight 178 lb 9.2 oz (81 kg), SpO2 100.00%. General: Elderly male in NAD,  alert and oriented, in no acute distress. HEENT: Normocephalic, atraumatic, sclera non-icteric, no xanthomas, nares are without discharge.  Neck: Supple. Carotids 2+ with bilateral bruits. Carotids delayed. JVP normal Lungs: Clear bilaterally to auscultation without wheezes, rales, or rhonchi. Breathing is unlabored. Heart: RRR with grade 3/6 harsh systolic murmur best heard at the LSB Abdomen: Soft, non-tender, non-distended with normoactive bowel sounds. No hepatomegaly. No rebound/guarding. No obvious abdominal masses. Back: No CVA tenderness Msk:  Strength and tone appear normal for age. Extremities: No clubbing, cyanosis, or edema.  Distal pedal pulses are 2+ and equal bilaterally. Neuro: CNII-XII intact, moves all extremities spontaneously. Psych:  Responds to questions appropriately with a normal affect.    Labs: No results found for this basename: CKTOTAL, CKMB, TROPONINI,  in the last 72 hours Lab Results  Component Value Date   WBC 8.7  01/03/2014   HGB 12.7* 01/03/2014   HCT 37.1* 01/03/2014   MCV 93.5 01/03/2014   PLT 258 01/03/2014    Recent Labs Lab 01/03/14 0500  01/06/14 0508  NA 139  < > 139  K 3.5*  < > 3.9  CL 98  < > 96  CO2 26  < > 29  BUN 17  < > 24*  CREATININE 1.09  < > 1.13  CALCIUM 8.7  < > 9.3  PROT 6.2  --   --   BILITOT 0.4  --   --   ALKPHOS 276*  --   --   ALT 8  --   --   AST 18  --   --   GLUCOSE 114*  < > 114*  < > = values in this interval not displayed. Lab Results  Component Value Date   CHOL 246* 03/30/2013   HDL 55 03/30/2013   LDLCALC 171* 03/30/2013   TRIG 101 03/30/2013   No results found for this basename: DDIMER    Radiology/Studies:  Dg Chest 1 View  12/31/2013   CLINICAL DATA:  Post left thoracentesis  EXAM: CHEST - 1 VIEW  COMPARISON:  12/1913  FINDINGS: There is significant decreased opacity on the left reflecting evacuation of most of the left pleural effusion. There is no pneumothorax. Opacity at the left lung base may  reflect infiltrate or atelectasis.  There is vascular congestion centrally throughout the right lung with mild diffuse hazy opacity similar to the prior study.  IMPRESSION: No pneumothorax following thoracentesis on the left. Marked reduction in left pleural effusion.   Electronically Signed   By: Lajean Manes M.D.   On: 12/31/2013 14:32   Dg Chest 2 View  12/30/2013   CLINICAL DATA:  Weakness, shortness of breath.  EXAM: CHEST  2 VIEW  COMPARISON:  12/10/2013  FINDINGS: Moderate left pleural effusion, stable since prior study. Left lower lobe atelectasis or infiltrate. Diffuse opacities now project throughout both lungs, likely mild edema. Heart is borderline in size.  Advanced degenerative changes in the right shoulder. The previously described left seventh rib lesion not visualized on this study.  IMPRESSION: Moderate left pleural effusion with left lower lobe atelectasis or infiltrate. Diffuse opacities throughout the lungs may reflect new pulmonary edema.   Electronically Signed   By: Rolm Baptise M.D.   On: 12/30/2013 19:27   Dg Chest 2 View  12/10/2013   CLINICAL DATA:  Shortness of breath with cough. Recent pneumonia. Metastatic prostate cancer.  EXAM: CHEST  2 VIEW  COMPARISON:  Radiographs 09/13/2013. Whole body bone scan 09/19/2013.  FINDINGS: Left-greater-than-right pleural effusions have enlarged. There is increased left basilar airspace disease without consolidation. There is no edema. The heart size and mediastinal contours are stable. There is sclerosis of the left 7th rib laterally which may correspond with the focal uptake on recent bone scan. Severe glenohumeral arthropathy is noted on the right.  IMPRESSION: Enlarging left-greater-than-right pleural effusions with associated left lower lobe airspace disease. This may reflect atelectasis or any infiltrate. Possible metastasis to the left 7th rib laterally.   Electronically Signed   By: Camie Patience M.D.   On: 12/10/2013 14:57   US  Thoracentesis Asp Pleural Space W/img Guide  12/31/2013   CLINICAL DATA:  Shortness of breath, left-sided pleural effusion. Request diagnostic and therapeutic thoracentesis.  EXAM: ULTRASOUND GUIDED left THORACENTESIS  COMPARISON:  None.  FINDINGS: A total of approximately 2 L of clear yellow fluid was  removed. A fluid sample wassent for laboratory analysis.  IMPRESSION: Successful ultrasound guided left thoracentesis yielding 2 L of pleural fluid.  Read by: Ascencion Dike PA-C  PROCEDURE: An ultrasound guided thoracentesis was thoroughly discussed with the patient and questions answered. The benefits, risks, alternatives and complications were also discussed. The patient understands and wishes to proceed with the procedure. Written consent was obtained.  Ultrasound was performed to localize and mark an adequate pocket of fluid in the left chest. The area was then prepped and draped in the normal sterile fashion. 1% Lidocaine was used for local anesthesia. Under ultrasound guidance a 19 gauge Yueh catheter was introduced. Thoracentesis was performed. The catheter was removed and a dressing applied.  Complications:  None immediate   Electronically Signed   By: Maryclare Bean M.D.   On: 12/31/2013 14:23   2 D Echo: Study Conclusions  - Left ventricle: The cavity size was moderately to severely dilated. Wall thickness was normal. Systolic function was moderately to severely reduced. The estimated ejection fraction was in the range of 30% to 35%. Regional wall motion abnormalities cannot be excluded. Features are consistent with a pseudonormal left ventricular filling pattern, with concomitant abnormal relaxation and increased filling pressure (grade 2 diastolic dysfunction). - Aortic valve: There was severe stenosis. Mild regurgitation. Valve area: 0.67cm^2(VTI). Valve area: 0.68cm^2 (Vmax). - Left atrium: The atrium was mildly dilated. - Pulmonary arteries: PA peak pressure: 43mm Hg (S).  Aortic valve:  Moderately thickened, moderately calcified leaflets. Doppler: There was severe stenosis. Mild regurgitation. Valve area: 0.67cm^2(VTI). Indexed valve area: 0.3cm^2/m^2 (VTI). Peak velocity ratio of LVOT to aortic valve: 0.2. Valve area: 0.68cm^2 (Vmax). Indexed valve area: 0.31cm^2/m^2 (Vmax). Mean gradient: 47mm Hg (S). Peak gradient: 46mm Hg (S).  Cardiac Cath: Procedural Findings:  Hemodynamics  RA 7  RV 39/8  PA 38/18 mean 25  PCWP 13 mean  LV 158/16  AO 122/58  Oxygen saturations:  PA 65  AO 92  SVC 61  Cardiac Output (Fick) 5.2  Cardiac Index (Fick) 2.5  Aortic Valve Hemodynamics: Peak-peak gradient 35, mean gradient 30 mm Hg, AVA 1.0 square cm  Coronary angiography:  Coronary dominance: right  Left mainstem: The left mainstem is patent. There is mild 20-30% ostial stenosis. The vessel was calcified. There is also 30% stenosis just before the trifurcation of the intermediate branch, circumflex, and LAD.  Left anterior descending (LAD): The LAD is widely patent throughout. The vessel wraps around the left ventricular apex. There are diffuse irregularities but no significant stenoses. The diagonal branches are small without significant disease.  Left circumflex (LCx): The left circumflex is patent. The intermediate branch is fairly large in caliber and it divides into twin vessels in its midportion. There is no significant disease noted. The AV circumflex has mild 30% stenosis involving the first obtuse marginal branch. There is no significant stenosis throughout the left circumflex distribution.  Right coronary artery (RCA): The right coronary artery is dominant. The vessel is normal in caliber. There is minimal calcification present. There are minor irregularities but no evidence of high-grade stenosis. The PDA and PLA branches are patent.  Left ventriculography: Deferred  Aortic root angiography: The aortic valve is severely calcified. There is restricted opening. There is mild  aortic insufficiency. The proximal Ascending aorta appears normal in caliber.  Final Conclusions:  1. Patent coronary arteries with nonobstructive CAD present  2. Severe calcific aortic stenosis  3. Known left ventricular systolic dysfunction  4. Compensated intracardiac hemodynamics  Recommendations: Will  continue to evaluate with the multidisciplinary valve team for treatment of aortic stenosis and congestive heart failure.   STS RISK SCORES Procedure: AV Replacement Risk of Mortality: 3.723% Morbidity or Mortality: 26.972% Long Length of Stay: 11.483% Short Length of Stay: 21.05% Permanent Stroke: 2.602% Prolonged Ventilation: 18.202% DSW Infection: 0.384% Renal Failure: 6.046% Reoperation: 12.123%    ASSESSMENT AND PLAN:  80 year-old gentleman with severe symptomatic aortic stenosis. Significant comorbid medical conditions include metastatic prostate CA, NYHA Class 4 CHF, severe LV systolic dysfunction, atrial fibrillation, and stroke within the past 12 months. Despite his significant comorbidities, his STS risk score is less than 5%. However, I agree with Dr Roxy Manns that his risk of traditional surgical AVR is much higher than predicted based on generalized weakness, protein-calorie malnutrition, and metastatic prostate CA, none of which are accounted for in the STS risk calculator. The patient has been formally seen by oncology and his prognosis related to underlying prostate CA is expected to be greater than 1 year survival. Under these circumstances, I think it reasonable to proceed with continued evaluation for TAVR as a treatment alternative to surgical AVR. Will arrange for the following studies:   gated cardiac CTA  CTA of the chest, abdomen, and pelvis to evaluate arterial access  Formal PT evaluation  Second cardiac surgical opinion  I discussed timing of intervention with the patient, who favors moving forward as quickly as possible. Will hold IV lasix tonight in  preparation for further contrast studies tomorrow.   Deatra James  01/06/2014, 7:33 PM

## 2014-01-06 NOTE — Progress Notes (Signed)
ANTICOAGULATION CONSULT NOTE - Follow Up Consult  Pharmacy Consult for Heparin Indication: atrial fibrillation  Allergies  Allergen Reactions  . Other     Beer and Wine- swelling/headache/nausea   Patient Measurements: Height: 6\' 2"  (188 cm) Weight: 178 lb 9.2 oz (81 kg) IBW/kg (Calculated) : 82.2  Vital Signs: Temp: 97.4 F (36.3 C) (01/26 1336) Temp src: Oral (01/26 1336) BP: 113/52 mmHg (01/26 1336) Pulse Rate: 76 (01/26 1336)  Labs:  Recent Labs  01/04/14 0438 01/05/14 0455 01/06/14 0508  HEPARINUNFRC 0.47 0.41 0.56  CREATININE 1.13 1.11 1.13   Estimated Creatinine Clearance: 60.7 ml/min (by C-G formula based on Cr of 1.13).  Assessment: 48 yoM admitted 1/19 with weakness, metastatic prostate cancer, pleural effusion, CAP, aortic stenosis, and CHF. S/p 2L thoracentesis on 1/20, pleural effusion cytology is pending. He was noted to have paroxysmal atrial fibrillation on telemetry. He is s/p cath with findings as noted.  SCr: stable at 1.1 with CrCl ~ 61 ml/min  CBC: Las Hgb checked 1/23 and is stable  Noted plan to transition to warfarin prior to discharge, but planning to resume heparin 8 hours after sheath removed  No bleeding or complications per RN  Heparin level was therapeutic on IV infusion rate of 12.5 ml/hr.  Goal of Therapy:  Heparin level 0.3-0.7 units/ml Monitor platelets by anticoagulation protocol: Yes   Plan:   Restart IV heparin infusion at 1250 units/hr 8 hours after sheath removed.  Daily heparin level  CBC daily for now  Follow up long-term anticoagulation plans  Monitor for bleeding complications.  Rober Minion, PharmD., MS Clinical Pharmacist Pager:  [phone] Thank you for allowing pharmacy to be part of this patients care team. 01/06/2014 5:07 PM

## 2014-01-06 NOTE — H&P (View-Only) (Signed)
SUBJECTIVE: The patient not in the room, at the PFT.    OBJECTIVE:   Vitals:   Filed Vitals:   01/05/14 0458 01/05/14 1402 01/05/14 2201 01/06/14 0619  BP: 100/55 105/48 106/63 114/60  Pulse: 74 74 78 73  Temp: 98.7 F (37.1 C) 97 F (36.1 C) 98.1 F (36.7 C) 98.1 F (36.7 C)  TempSrc: Oral Axillary Oral Oral  Resp: 18 18 18 18   Height:      Weight: 182 lb 8.7 oz (82.8 kg)   178 lb 9.2 oz (81 kg)  SpO2: 95% 95% 98% 98%   I&O's:    Intake/Output Summary (Last 24 hours) at 01/06/14 0844 Last data filed at 01/05/14 2202  Gross per 24 hour  Intake 780.42 ml  Output   2375 ml  Net -1594.58 ml   . aspirin  324 mg Oral Pre-Cath  . diazepam  5 mg Oral On Call  . feeding supplement (PRO-STAT SUGAR FREE 64)  30 mL Oral BID WC  . feeding supplement (RESOURCE BREEZE)  1 Container Oral BID BM  . furosemide  40 mg Intravenous BID  . metoprolol succinate  25 mg Oral Daily  . multivitamin with minerals  1 tablet Oral Daily  . pantoprazole  40 mg Oral Daily  . potassium chloride  20 mEq Oral BID  . protein supplement  1 scoop Oral BID WC  . simvastatin  20 mg Oral q1800  . sodium chloride  3 mL Intravenous Q12H  . sodium chloride  3 mL Intravenous Q12H   TELEMETRY: Reviewed telemetry pt in NSR to sinus tachycardia this am:  The patient was not seen.  LABS: Basic Metabolic Panel:  Recent Labs  01/05/14 0455 01/06/14 0508  NA 138 139  K 3.8 3.9  CL 97 96  CO2 27 29  GLUCOSE 116* 114*  BUN 24* 24*  CREATININE 1.11 1.13  CALCIUM 8.7 9.3    Recent Labs  01/04/14 0438  HGBA1C 5.8*    Recent Labs  01/04/14 0438 01/05/14 0947  TSH 7.102*  --   T3FREE  --  3.1   Coag Panel:   Lab Results  Component Value Date   INR 1.17 01/01/2014   INR 1.05 03/29/2013    RADIOLOGY: Dg Chest 1 View  12/31/2013   CLINICAL DATA:  Post left thoracentesis  EXAM: CHEST - 1 VIEW  COMPARISON:  12/1913  FINDINGS: There is significant decreased opacity on the left reflecting  evacuation of most of the left pleural effusion. There is no pneumothorax. Opacity at the left lung base may reflect infiltrate or atelectasis.  There is vascular congestion centrally throughout the right lung with mild diffuse hazy opacity similar to the prior study.  IMPRESSION: No pneumothorax following thoracentesis on the left. Marked reduction in left pleural effusion.   Electronically Signed   By: Lajean Manes M.D.   On: 12/31/2013 14:32     Assessment/Plan:   1 acute combined systolic/diastolic congestive heart failure-the patient remains mildly volume overloaded on examination yesterday but significantly improved, another -1.5 L since yesterday. This is related to progressive aortic stenosis and reduced LV function. Continue Lasix 40 mg twice a day and follow renal function. Creatinine is stable. Supplement K. Albumin 3.   2 aortic stenosis-the patient has severe aortic stenosis on echo with reduced LV function. Based on oncology's notes the patient's prognosis is greater than one year. Dr. Roxy Manns evaluated patient and he feels patient may be a good candidate  for TAVR.  We will arrange transfer to [place] hospital today for cardiac catheterization as well as CTA of his thoracic and abdominal aorta. Continue diuresis over the weekend.   3 paroxysmal atrial fibrillation- only SR on telemetry currently, the patient was noted to have paroxysmal atrial fibrillation on telemetry previously. Continue Toprol. He has multiple embolic risk factors including prior stroke, age greater than 75, congestive heart failure, hypertension. Continue heparin for now. We will transition to Coumadin prior to discharge once it is clear all procedures complete.  Patient would not be a candidate for NOAC given valvular heart disease.  TSH is high, we will check fT3 and fT4.  4 metastatic prostate cancer-Cytology from thoracentesis negative; management per oncology; based on their opinion, prognosis greater than one  year.   5 CM - most likely from aortic stenosis. Continue beta blocker. No ACE inhibitor as his blood pressure is borderline and he had severe aortic stenosis.    Delaine Hernandez, H, MD  01/06/2014  8:44 AM  

## 2014-01-06 NOTE — Progress Notes (Signed)
SUBJECTIVE: The patient not in the room, at the PFT.    OBJECTIVE:   Vitals:   Filed Vitals:   01/05/14 0458 01/05/14 1402 01/05/14 2201 01/06/14 0619  BP: 100/55 105/48 106/63 114/60  Pulse: 74 74 78 73  Temp: 98.7 F (37.1 C) 97 F (36.1 C) 98.1 F (36.7 C) 98.1 F (36.7 C)  TempSrc: Oral Axillary Oral Oral  Resp: 18 18 18 18   Height:      Weight: 182 lb 8.7 oz (82.8 kg)   178 lb 9.2 oz (81 kg)  SpO2: 95% 95% 98% 98%   I&O's:    Intake/Output Summary (Last 24 hours) at 01/06/14 0844 Last data filed at 01/05/14 2202  Gross per 24 hour  Intake 780.42 ml  Output   2375 ml  Net -1594.58 ml   . aspirin  324 mg Oral Pre-Cath  . diazepam  5 mg Oral On Call  . feeding supplement (PRO-STAT SUGAR FREE 64)  30 mL Oral BID WC  . feeding supplement (RESOURCE BREEZE)  1 Container Oral BID BM  . furosemide  40 mg Intravenous BID  . metoprolol succinate  25 mg Oral Daily  . multivitamin with minerals  1 tablet Oral Daily  . pantoprazole  40 mg Oral Daily  . potassium chloride  20 mEq Oral BID  . protein supplement  1 scoop Oral BID WC  . simvastatin  20 mg Oral q1800  . sodium chloride  3 mL Intravenous Q12H  . sodium chloride  3 mL Intravenous Q12H   TELEMETRY: Reviewed telemetry pt in NSR to sinus tachycardia this am:  The patient was not seen.  LABS: Basic Metabolic Panel:  Recent Labs  01/05/14 0455 01/06/14 0508  NA 138 139  K 3.8 3.9  CL 97 96  CO2 27 29  GLUCOSE 116* 114*  BUN 24* 24*  CREATININE 1.11 1.13  CALCIUM 8.7 9.3    Recent Labs  01/04/14 0438  HGBA1C 5.8*    Recent Labs  01/04/14 0438 01/05/14 0947  TSH 7.102*  --   T3FREE  --  3.1   Coag Panel:   Lab Results  Component Value Date   INR 1.17 01/01/2014   INR 1.05 03/29/2013    RADIOLOGY: Dg Chest 1 View  12/31/2013   CLINICAL DATA:  Post left thoracentesis  EXAM: CHEST - 1 VIEW  COMPARISON:  12/1913  FINDINGS: There is significant decreased opacity on the left reflecting  evacuation of most of the left pleural effusion. There is no pneumothorax. Opacity at the left lung base may reflect infiltrate or atelectasis.  There is vascular congestion centrally throughout the right lung with mild diffuse hazy opacity similar to the prior study.  IMPRESSION: No pneumothorax following thoracentesis on the left. Marked reduction in left pleural effusion.   Electronically Signed   By: Lajean Manes M.D.   On: 12/31/2013 14:32     Assessment/Plan:   1 acute combined systolic/diastolic congestive heart failure-the patient remains mildly volume overloaded on examination yesterday but significantly improved, another -1.5 L since yesterday. This is related to progressive aortic stenosis and reduced LV function. Continue Lasix 40 mg twice a day and follow renal function. Creatinine is stable. Supplement K. Albumin 3.   2 aortic stenosis-the patient has severe aortic stenosis on echo with reduced LV function. Based on oncology's notes the patient's prognosis is greater than one year. Dr. Roxy Manns evaluated patient and he feels patient may be a good candidate  for TAVR.  We will arrange transfer to [facility] today for cardiac catheterization as well as CTA of his thoracic and abdominal aorta. Continue diuresis over the weekend.   3 paroxysmal atrial fibrillation- only SR on telemetry currently, the patient was noted to have paroxysmal atrial fibrillation on telemetry previously. Continue Toprol. He has multiple embolic risk factors including prior stroke, age greater than 32, congestive heart failure, hypertension. Continue heparin for now. We will transition to Coumadin prior to discharge once it is clear all procedures complete.  Patient would not be a candidate for NOAC given valvular heart disease.  TSH is high, we will check fT3 and fT4.  4 metastatic prostate cancer-Cytology from thoracentesis negative; management per oncology; based on their opinion, prognosis greater than one  year.   5 CM - most likely from aortic stenosis. Continue beta blocker. No ACE inhibitor as his blood pressure is borderline and he had severe aortic stenosis.    Ena Dawley, Lemmie Evens, MD  01/06/2014  8:44 AM

## 2014-01-06 NOTE — Progress Notes (Signed)
PT Cancellation Note  Patient Details Name: Lucas Green MRN: [id number] DOB: 1932/05/30   Cancelled Treatment:    Reason Eval/Treat Not Completed: Patient at procedure or test/unavailable--will hold PT today. Pt to [facility] for [facility] procedures. Will check back another day. Thanks.    Weston Anna, MPT Pager: [phone]

## 2014-01-06 NOTE — Interval H&P Note (Signed)
History and Physical Interval Note:  01/06/2014 3:41 PM  Lucas Green  has presented today for surgery, with the diagnosis of Valve replacement  The various methods of treatment have been discussed with the patient and family. After consideration of risks, benefits and other options for treatment, the patient has consented to  Procedure(s): LEFT AND RIGHT HEART CATHETERIZATION WITH CORONARY ANGIOGRAM (N/A) as a surgical intervention .  The patient's history has been reviewed, patient examined, no change in status, stable for surgery.  I have reviewed the patient's chart and labs.  Questions were answered to the patient's satisfaction.    Cath Lab Visit (complete for each Cath Lab visit)  Clinical Evaluation Leading to the Procedure:   ACS: no  Non-ACS:    Anginal Classification: No Symptoms  Anti-ischemic medical therapy: Minimal Therapy (1 class of medications)  Non-Invasive Test Results: No non-invasive testing performed  Prior CABG: No previous CABG       Lucas Green

## 2014-01-06 NOTE — Plan of Care (Signed)
Problem: Consults Goal: Cardiac Cath Patient Education (See Patient Education module for education specifics.)  Outcome: Progressing Pt watched the CCTV video. Requesting more information from the cardiologist.

## 2014-01-06 NOTE — Progress Notes (Signed)
[facility] PROGRESS NOTE  Lucas Green FBP:[id number] DOB: 1932-06-08 DOA: 12/30/2013 PCP: Teressa Lower, MD Brief narrative: Patient is a 80 year old Caucasian male with history of metastatic prostate cancer currently being followed by urology and oncology. Also has history of aortic stenosis of which cardiology is currently considering for TAPVR. Cardiology to transition patient to [facility] for further evaluation and recommendations in regards to his aortic stenosis. Once patient's aortic stenosis has been addressed oncology is considering plans for further treatment and recommendations regarding his prostate cancer.  Assessment/Plan: Weakness generalized, failure to thrive with moderate proteinuria malnutrition  - nutrition consult: protein supplementation per consult - supportive care with IV fluids, encourage PO intake: improving  - Elevated TSH. Awaiting results of free T3/4  Pleural effusion, left  - cytology from thoracentesis negative - s/p thoracentesis 1/20, diagnostic and therapeutic, oxygen support via nasal canula to keep O2 saturation above 90%: currently on room air   Aortic valve disorder - Cardiology on board and per their note they will transfer him to [facility] on 01/06/14 for possible TAVR.  CAP (community acquired pneumonia)  - Discontinue azithromycin and rocephin 1/23. Afebrile and WBC's WNL. SOB most likely due to pleural effusion. - follow up blood cultures: no growth, resp culture: no growth  Hypertension  - continue metoprolol   Prostate cancer with bone metastases  - under GU and Dr. Hazeline Junker care  - Discussed with oncologist and currently recommendations are to address current cardiac problems before getting aggressive with prostate cancer management.  Hypokalemia  - resolved   Nausea and vomiting, dehydration  - likely due to prostate cancer. - resolved- Saline lock - unable to use zofran related to prolonged QT syndrome   Dyslipidemia   - continue statin therapy   Moderate protein-calorie malnutrition  - nutrition consulted and patient currently on resource protein supplement.  Asymmetric edema left leg more than right.  -Venous duplex negative for deep vein thrombosis  Code Status: DNR Family Communication: wife at bedside. Updated on plan of care, awaiting transfer to [facility] Disposition Plan: Transfer to [facility] scheduled for 1/26 for further cardiac work-up and possible TAVR.   Consultants:  Urology  Cardiology   Procedures:  US thoracentesis ASP Pleural space with IMG guide  Antibiotics:  Azithromycin (day 5/5)  Rocephin (day 5/5)  HPI/Subjective: Patient has no new complaints. Feeling better. No complaints of nausea currently with improved PO intake.  Objective: Filed Vitals:   01/06/14 0619  BP: 114/60  Pulse: 73  Temp: 98.1 F (36.7 C)  Resp: 18    Intake/Output Summary (Last 24 hours) at 01/06/14 1323 Last data filed at 01/06/14 0900  Gross per 24 hour  Intake 300.42 ml  Output   1400 ml  Net -1099.58 ml   Filed Weights   01/04/14 0454 01/05/14 0458 01/06/14 0619  Weight: 83.598 kg (184 lb 4.8 oz) 82.8 kg (182 lb 8.7 oz) 81 kg (178 lb 9.2 oz)    Exam:   General:  Pt in NAD, Alert and awake  Cardiovascular: RRR, systolic murmur grade 3-4  Respiratory: no wheezes, breath sounds BL. On room air.  Abdomen: soft, NT, ND  Musculoskeletal: no cyanosis or clubbing   Data Reviewed: Basic Metabolic Panel:  Recent Labs Lab 01/02/14 0507 01/03/14 0500 01/04/14 0438 01/05/14 0455 01/06/14 0508  NA 141 139 139 138 139  K 3.3* 3.5* 3.7 3.8 3.9  CL 103 98 99 97 96  CO2 25 26 27 27 29   GLUCOSE 117*  114* 115* 116* 114*  BUN 20 17 18  24* 24*  CREATININE 1.11 1.09 1.13 1.11 1.13  CALCIUM 8.4 8.7 8.6 8.7 9.3   Liver Function Tests:  Recent Labs Lab 01/03/14 0500  AST 18  ALT 8  ALKPHOS 276*  BILITOT 0.4  PROT 6.2  ALBUMIN 3.0*   No results found for this  basename: LIPASE, AMYLASE,  in the last 168 hours No results found for this basename: AMMONIA,  in the last 168 hours CBC:  Recent Labs Lab 12/30/13 1722 12/31/13 0455 01/01/14 0449 01/02/14 0507 01/03/14 0800  WBC 7.8 7.5 8.2 8.8 8.7  HGB 13.2 11.6* 11.8* 12.1* 12.7*  HCT 38.5* 33.8* 35.2* 36.6* 37.1*  MCV 92.3 93.1 94.1 94.1 93.5  PLT 325 327 308 287 258   Cardiac Enzymes: No results found for this basename: CKTOTAL, CKMB, CKMBINDEX, TROPONINI,  in the last 168 hours BNP (last 3 results)  Recent Labs  12/30/13 2210 01/01/14 0449  PROBNP 15574.0* 13190.0*   CBG:  Recent Labs Lab 12/31/13 0738  GLUCAP 105*    Recent Results (from the past 240 hour(s))  CULTURE, BLOOD (ROUTINE X 2)     Status: None   Collection Time    12/30/13  7:55 PM      Result Value Range Status   Specimen Description BLOOD LEFT FOREARM   Final   Special Requests BOTTLES DRAWN AEROBIC ONLY 3ML   Final   Culture  Setup Time     Final   Value: 12/31/2013 01:10     Performed at [facility]   Culture     Final   Value: NO GROWTH 5 DAYS     Performed at [facility]   Report Status 01/06/2014 FINAL   Final  CULTURE, BLOOD (ROUTINE X 2)     Status: None   Collection Time    12/30/13  8:00 PM      Result Value Range Status   Specimen Description BLOOD LEFT ARM   Final   Special Requests BOTTLES DRAWN AEROBIC AND ANAEROBIC 5ML   Final   Culture  Setup Time     Final   Value: 12/31/2013 01:10     Performed at [facility]     Final   Value: NO GROWTH 5 DAYS     Performed at [facility]   Report Status 01/06/2014 FINAL   Final  CULTURE, BLOOD (ROUTINE X 2)     Status: None   Collection Time    12/30/13 10:10 PM      Result Value Range Status   Specimen Description BLOOD LEFT ARM   Final   Special Requests BOTTLES DRAWN AEROBIC ONLY 4CC   Final   Culture  Setup Time     Final   Value: 12/31/2013 01:10     Performed at [facility]    Culture     Final   Value: NO GROWTH 5 DAYS     Performed at [facility]   Report Status 01/06/2014 FINAL   Final  CULTURE, BLOOD (ROUTINE X 2)     Status: None   Collection Time    12/30/13 10:15 PM      Result Value Range Status   Specimen Description BLOOD LEFT HAND   Final   Special Requests BOTTLES DRAWN AEROBIC ONLY 2CC   Final   Culture  Setup Time     Final   Value: 12/31/2013 01:10  Performed at [facility]     Final   Value: NO GROWTH 5 DAYS     Performed at [facility]   Report Status 01/06/2014 FINAL   Final  BODY FLUID CULTURE     Status: None   Collection Time    12/31/13  2:05 PM      Result Value Range Status   Specimen Description PLEURAL   Final   Special Requests NONE   Final   Gram Stain     Final   Value: NO WBC SEEN     NO ORGANISMS SEEN     Performed at [facility]   Culture     Final   Value: NO GROWTH 3 DAYS     Performed at [facility]   Report Status 01/04/2014 FINAL   Final  CULTURE, EXPECTORATED SPUTUM-ASSESSMENT     Status: None   Collection Time    12/31/13  5:59 PM      Result Value Range Status   Specimen Description SPUTUM   Final   Special Requests NONE   Final   Sputum evaluation     Final   Value: THIS SPECIMEN IS ACCEPTABLE. RESPIRATORY CULTURE REPORT TO FOLLOW.   Report Status 12/31/2013 FINAL   Final  CULTURE, RESPIRATORY (NON-EXPECTORATED)     Status: None   Collection Time    12/31/13  5:59 PM      Result Value Range Status   Specimen Description SPUTUM   Final   Special Requests NONE   Final   Gram Stain     Final   Value: NO WBC SEEN     NO SQUAMOUS EPITHELIAL CELLS SEEN     NO ORGANISMS SEEN     Performed at [facility]   Culture     Final   Value: NORMAL OROPHARYNGEAL FLORA     Performed at [facility]   Report Status 01/03/2014 FINAL   Final     Studies: No results found.  Scheduled Meds: . aspirin  324 mg Oral Pre-Cath  . [MAR HOLD]  feeding supplement (PRO-STAT SUGAR FREE 64)  30 mL Oral BID WC  . [MAR HOLD] feeding supplement (RESOURCE BREEZE)  1 Container Oral BID BM  . [facility] HOLD] furosemide  40 mg Intravenous BID  . [facility] HOLD] metoprolol succinate  25 mg Oral Daily  . [MAR HOLD] multivitamin with minerals  1 tablet Oral Daily  . [MAR HOLD] pantoprazole  40 mg Oral Daily  . [facility] HOLD] potassium chloride  20 mEq Oral BID  . [MAR HOLD] protein supplement  1 scoop Oral BID WC  . [facility] HOLD] simvastatin  20 mg Oral q1800  . [facility] HOLD] sodium chloride  3 mL Intravenous Q12H  . sodium chloride  3 mL Intravenous Q12H   Continuous Infusions: . sodium chloride    . heparin Stopped (01/06/14 1231)    Principal Problem:   Weakness Active Problems:   Hypertension   Prostate cancer   Metastasis from malignant tumor of prostate   Bone metastases   Pleural effusion, left   Hypokalemia   CAP (community acquired pneumonia)   Nausea and vomiting   Dyslipidemia   Failure to thrive   Moderate protein-calorie malnutrition   Aortic valve disorders   Severe aortic stenosis   Atrial fibrillation   Chronic diastolic congestive heart failure   Acute on chronic diastolic heart failure    Time spent: > 40 minutes  Shawntelle Ungar, [facility] Pager [phone]. If 7PM-7AM, please contact night-coverage at [email or web address], password [facility] 01/06/2014, 1:23 PM  LOS: 7 days

## 2014-01-06 NOTE — CV Procedure (Addendum)
   Cardiac Catheterization Procedure Note  Name: Lucas Green MRN: [id number] DOB: [birth year]  Procedure: Right Heart Cath, Left Heart Cath, Selective Coronary Angiography  Indication: Severe aortic stenosis  Procedural Details: The right groin was prepped, draped, and anesthetized with 1% lidocaine. Using the modified Seldinger technique a 5 French sheath was placed in the right femoral artery and a 6 French sheath was placed in the right femoral vein. A multipurpose catheter was used for the right heart catheterization. Standard protocol was followed for recording of right heart pressures and sampling of oxygen saturations. Fick cardiac output was calculated. Standard Judkins catheters were used for selective coronary angiography and left ventriculography. There were no immediate procedural complications. The patient was transferred to the post catheterization recovery area for further monitoring.  Procedural Findings: Hemodynamics RA 7 RV 39/8 PA 38/18 mean 25 PCWP 13 mean LV 158/16 AO 122/58  Oxygen saturations: PA 65 AO 92 SVC 61  Cardiac Output (Fick) 5.2  Cardiac Index (Fick) 2.5  Aortic Valve Hemodynamics: Peak-peak gradient 35, mean gradient 30 mm Hg, AVA 1.0 square cm   Coronary angiography: Coronary dominance: right  Left mainstem: The left mainstem is patent. There is mild 20-30% ostial stenosis. The vessel was calcified. There is also 30% stenosis just before the trifurcation of the intermediate branch, circumflex, and LAD.  Left anterior descending (LAD): The LAD is widely patent throughout. The vessel wraps around the left ventricular apex. There are diffuse irregularities but no significant stenoses. The diagonal branches are small without significant disease.  Left circumflex (LCx): The left circumflex is patent. The intermediate branch is fairly large in caliber and it divides into twin vessels in its midportion. There is no significant disease noted. The  AV circumflex has mild 30% stenosis involving the first obtuse marginal branch. There is no significant stenosis throughout the left circumflex distribution.  Right coronary artery (RCA): The right coronary artery is dominant. The vessel is normal in caliber. There is minimal calcification present. There are minor irregularities but no evidence of high-grade stenosis. The PDA and PLA branches are patent.  Left ventriculography: Deferred  Aortic root angiography: The aortic valve is severely calcified. There is restricted opening. There is mild aortic insufficiency. The proximal  Ascending aorta appears normal in caliber.  Final Conclusions:   1. Patent coronary arteries with nonobstructive CAD present 2. Severe calcific aortic stenosis 3. Known left ventricular systolic dysfunction 4. Compensated intracardiac hemodynamics  Recommendations: Will continue to evaluate with the multidisciplinary valve team for treatment of aortic stenosis and congestive heart failure.  Sherren Mocha 01/06/2014, 4:38 PM

## 2014-01-06 NOTE — Progress Notes (Signed)
Pt refused to sign consent for cath for in am. Stated he wanted the doctor to talk to him about the procedure. CN attempted to reach cardiology but was unable to at this time.

## 2014-01-07 ENCOUNTER — Inpatient Hospital Stay (HOSPITAL_COMMUNITY): Payer: Medicare Other

## 2014-01-07 DIAGNOSIS — I359 Nonrheumatic aortic valve disorder, unspecified: Secondary | ICD-10-CM

## 2014-01-07 DIAGNOSIS — I5043 Acute on chronic combined systolic (congestive) and diastolic (congestive) heart failure: Secondary | ICD-10-CM

## 2014-01-07 LAB — CBC
HCT: 37.4 % — ABNORMAL LOW (ref 39.0–52.0)
Hemoglobin: 12.8 g/dL — ABNORMAL LOW (ref 13.0–17.0)
MCH: 31.8 pg (ref 26.0–34.0)
MCHC: 34.2 g/dL (ref 30.0–36.0)
MCV: 93 fL (ref 78.0–100.0)
Platelets: 262 10*3/uL (ref 150–400)
RBC: 4.02 MIL/uL — ABNORMAL LOW (ref 4.22–5.81)
RDW: 13.7 % (ref 11.5–15.5)
WBC: 9.1 10*3/uL (ref 4.0–10.5)

## 2014-01-07 LAB — BASIC METABOLIC PANEL
BUN: 22 mg/dL (ref 6–23)
CO2: 25 mEq/L (ref 19–32)
CREATININE: 1.11 mg/dL (ref 0.50–1.35)
Calcium: 9.1 mg/dL (ref 8.4–10.5)
Chloride: 98 mEq/L (ref 96–112)
GFR calc non Af Amer: 61 mL/min — ABNORMAL LOW (ref >90)
GFR, EST AFRICAN AMERICAN: 71 mL/min — AB (ref >90)
Glucose, Bld: 111 mg/dL — ABNORMAL HIGH (ref 70–99)
Potassium: 4.4 mEq/L (ref 3.7–5.3)
Sodium: 138 mEq/L (ref 137–147)

## 2014-01-07 LAB — HEPARIN LEVEL (UNFRACTIONATED)
HEPARIN UNFRACTIONATED: 0.41 [IU]/mL (ref 0.30–0.70)
Heparin Unfractionated: 0.25 IU/mL — ABNORMAL LOW (ref 0.30–0.70)

## 2014-01-07 MED ORDER — METOPROLOL TARTRATE 1 MG/ML IV SOLN
INTRAVENOUS | Status: AC
  Filled 2014-01-07: qty 5

## 2014-01-07 MED ORDER — SODIUM CHLORIDE 0.9 % IV SOLN
INTRAVENOUS | Status: DC
  Administered 2014-01-07 – 2014-01-12 (×3): via INTRAVENOUS

## 2014-01-07 MED ORDER — IOHEXOL 350 MG/ML SOLN
100.0000 mL | Freq: Once | INTRAVENOUS | Status: AC | PRN
  Administered 2014-01-07: 14:00:00 80 mL via INTRAVENOUS

## 2014-01-07 NOTE — Progress Notes (Signed)
Physical Therapy Treatment Patient Details Name: Lucas Green MRN: [id number] DOB: January 13, 1932 Today's Date: 01/07/2014 Time: 6606-3016 PT Time Calculation (min): 50 min  PT Assessment / Plan / Recommendation  History of Present Illness 80 yo male admitted with weakness, pleural effusion, FTT. Hx of HTN, CVA, arthritis, prostate cancer with mets, GERD.  Work up for TAVR.     PT Comments   Pt admitted with above. Pt currently with functional limitations due to endurance deficits. Performed testing per MD request for TAVR protocol.  TAVR is planned in the future.   Pt will benefit from skilled PT to increase their independence and safety with mobility to allow discharge to the venue listed below.   Follow Up Recommendations  CIR;Supervision/Assistance - 24 hour                 Equipment Recommendations  None recommended by PT    Recommendations for Other Services Rehab consult;OT consult  Frequency Min 3X/week   Progress towards PT Goals Progress towards PT goals: Progressing toward goals  Plan Current plan remains appropriate    Precautions / Restrictions Precautions Precautions: Fall Restrictions Weight Bearing Restrictions: No   Pertinent Vitals/Pain VSS, No pain    Mobility  Bed Mobility Overal bed mobility: Needs Assistance Bed Mobility: Supine to Sit Supine to sit: Min guard;HOB elevated General bed mobility comments: Relied on handrails. close guard for safety.  Transfers Overall transfer level: Needs assistance Equipment used: Rolling walker (2 wheeled) Transfers: Sit to/from Stand Sit to Stand: Min guard;From elevated surface General transfer comment: Assist to rise, stabilize and control descent.  Verbal cues for safety and technique as well as hand placement.   Ambulation/Gait Ambulation/Gait assistance: Min assist Ambulation Distance (Feet): 400 Feet Assistive device: Rolling walker (2 wheeled) Gait Pattern/deviations: Trunk flexed;Decreased stride  length;Step-through pattern Gait velocity interpretation: <1.8 ft/sec, indicative of risk for recurrent falls General Gait Details: Assist to stabilize intermittently. Fatigues fairly easily.    TAVR information/Baseline data  Frailty index:  6 5 meter walk test:  15.4 sec, 15.6 sec, 15.7 sec = Avg 15.5 sec                                16.4 ft/15.5 sec = 1.06 ft/sec = .457 m/sec.  Pt with significantly slow gait speed at risk for falls.   6 min walk test                            BP                   HR                    O2                  Modified Borg/dyspnea                PRE Borg                         97/57                80bpm             96%  0/10                                          6/20                         95/64               102 bpm           96%                            1/10                                        13/20 Pt ambulated 400 feet/121.92 meters total.  Pt well below norm for ambulation distance in 6 min for his age.      PT Goals (current goals can now be found in the care plan section)    Visit Information  Last PT Received On: 01/07/14 Assistance Needed: +1 History of Present Illness: 80 yo male admitted with weakness, pleural effusion, FTT. Hx of HTN, CVA, arthritis, prostate cancer with mets, GERD.  Work up for TAVR.      Subjective Data  Subjective: "I feel pretty good."   Cognition  Cognition Arousal/Alertness: Awake/alert Behavior During Therapy: WFL for tasks assessed/performed Overall Cognitive Status: Within Functional Limits for tasks assessed    Balance  Balance Overall balance assessment: Needs assistance Standing balance support: Bilateral upper extremity supported;During functional activity Standing balance-Leahy Scale: Fair High level balance activites: Turns;Sudden stops;Direction changes High Level Balance Comments: needs min guard assist with above. General Comments General comments (skin  integrity, edema, etc.): Sat EOB for 6 min with supervision  End of Session PT - End of Session Equipment Utilized During Treatment: Gait belt Activity Tolerance: Patient limited by fatigue Patient left: in bed;with call bell/phone within reach;with family/visitor present Nurse Communication: Mobility status        INGOLD,Tonjua Rossetti 01/07/2014, 2:35 PM [facility] [phone] (pager)

## 2014-01-07 NOTE — Progress Notes (Signed)
ANTICOAGULATION CONSULT NOTE - Follow Up Consult  Pharmacy Consult for heparin Indication: atrial fibrillation  Allergies  Allergen Reactions  . Other     Beer and Wine- swelling/headache/nausea    Patient Measurements: Height: 6\' 2"  (188 cm) Weight: 180 lb 14.4 oz (82.056 kg) IBW/kg (Calculated) : 82.2 Heparin Dosing Weight: 82.2 kg  Vital Signs: Temp: 97.3 F (36.3 C) (01/27 1626) Temp src: Oral (01/27 1626) BP: 100/53 mmHg (01/27 1626) Pulse Rate: 82 (01/27 1626)  Labs:  Recent Labs  01/05/14 0455 01/06/14 0508 01/07/14 0730 01/07/14 0920  HGB  --   --  12.8*  --   HCT  --   --  37.4*  --   PLT  --   --  262  --   HEPARINUNFRC 0.41 0.56  --  0.25*  CREATININE 1.11 1.13 1.11  --     Estimated Creatinine Clearance: 62.7 ml/min (by C-G formula based on Cr of 1.11).   Medications:  Scheduled:  . feeding supplement (RESOURCE BREEZE)  1 Container Oral BID BM  . metoprolol      . metoprolol succinate  25 mg Oral Daily  . multivitamin with minerals  1 tablet Oral Daily  . pantoprazole  40 mg Oral Daily  . potassium chloride  20 mEq Oral BID  . protein supplement  1 scoop Oral BID WC  . simvastatin  20 mg Oral q1800   Infusions:  . sodium chloride 10 mL/hr at 01/07/14 0506  . heparin 1,300 Units/hr (01/07/14 1248)    Assessment: 80 yo male with afib is currently on therapeutic heparin.  Heparin level is 0.41. Goal of Therapy:  Heparin level 0.3-0.7 units/ml Monitor platelets by anticoagulation protocol: Yes   Plan:  1) Cont heparin at 1300 units/hr 2) Daily heparin level and CBC  Beverlie Kurihara, Tsz-Yin 01/07/2014,6:10 PM

## 2014-01-07 NOTE — Progress Notes (Signed)
ANTICOAGULATION CONSULT NOTE - Follow Up Consult  Pharmacy Consult for Heparin Indication: atrial fibrillation  Allergies  Allergen Reactions  . Other     Beer and Wine- swelling/headache/nausea    Patient Measurements: Height: 6\' 2"  (188 cm) Weight: 179 lb 14.3 oz (81.6 kg) IBW/kg (Calculated) : 82.2 Heparin Dosing Weight:   Vital Signs: Temp: 98.1 F (36.7 C) (01/27 1238) Temp src: Oral (01/27 1238) BP: 97/57 mmHg (01/27 1238) Pulse Rate: 78 (01/27 1238)  Labs:  Recent Labs  01/05/14 0455 01/06/14 0508 01/07/14 0730 01/07/14 0920  HGB  --   --  12.8*  --   HCT  --   --  37.4*  --   PLT  --   --  262  --   HEPARINUNFRC 0.41 0.56  --  0.25*  CREATININE 1.11 1.13 1.11  --     Estimated Creatinine Clearance: 62.3 ml/min (by C-G formula based on Cr of 1.11).   Medications:  Scheduled:  . feeding supplement (RESOURCE BREEZE)  1 Container Oral BID BM  . metoprolol      . metoprolol succinate  25 mg Oral Daily  . multivitamin with minerals  1 tablet Oral Daily  . pantoprazole  40 mg Oral Daily  . potassium chloride  20 mEq Oral BID  . protein supplement  1 scoop Oral BID WC  . simvastatin  20 mg Oral q1800  . sodium chloride  3 mL Intravenous Q12H    Assessment: 80yo male with AFib, s/p cath with plans for Coumadin prior to discharge.  Heparin level this AM just below goal at 0.25; currently on 1250 units/hr which was previously a therapeutic rate x 6 days.  Per d/w RN, there has been no problems with the IV.  Hg 12.8 and stable, pltc wnl.  No bleeding problems noted.  Goal of Therapy:  Heparin level 0.3-0.7 units/ml Monitor platelets by anticoagulation protocol: Yes   Plan:  1.  Increase heparin to 1300 units/hr 2.  Heparin level in 6 hours 3.  Daily CBC, HL  Gracy Bruins, PharmD Clinical Pharmacist [facility]

## 2014-01-07 NOTE — Progress Notes (Signed)
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 Breathing has significantly improved and LEE has resolved. Ate a full dinner last night for the first time in weeks.  Objective   Telemetry: NSR. Brief vent bigeminy yesterday. Last night - 18 beats of a wider complex tachycardia, upright morphology in V lead ?abberrancy vs NSVT.  Physical Exam: Blood pressure 97/57, pulse 78, temperature 98.1 F (36.7 C), temperature source Oral, resp. rate 16, height 6\' 2"  (1.88 m), weight 179 lb 14.3 oz (81.6 kg), SpO2 96.00%. General: Well developed, elderly WM in no acute distress. Head: Normocephalic, atraumatic, sclera non-icteric, no xanthomas, nares are without discharge. Neck: JVP mildly elevated. Lungs: Clear bilaterally to auscultation without wheezes, rales, or rhonchi. Breathing is unlabored. Heart: RRR S1 S2 with 3/6 harsh SEM heard throughout precordium. No rubs or gallops.  Abdomen: Soft, non-tender, non-distended with normoactive bowel sounds. No rebound/guarding. Extremities: No clubbing or cyanosis. No edema. Distal pedal pulses are 2+ and equal bilaterally. Neuro: Alert and oriented X 3. Moves all extremities spontaneously. Psych:  Responds to questions appropriately with a normal affect.   Intake/Output Summary (Last 24 hours) at 01/07/14 1258 Last data filed at 01/07/14 0942  Gross per 24 hour  Intake 2284.9 ml  Output   1075 ml  Net 1209.9 ml    Inpatient Medications:  . feeding supplement (RESOURCE BREEZE)  1 Container Oral BID BM  . metoprolol      . metoprolol succinate  25 mg Oral Daily  . multivitamin with minerals  1 tablet Oral Daily  . pantoprazole  40 mg Oral Daily  . potassium chloride  20 mEq Oral BID  . protein supplement  1 scoop Oral BID WC  . simvastatin  20 mg Oral q1800  . sodium chloride  3 mL Intravenous Q12H   Infusions:  . sodium chloride 10 mL/hr at  01/07/14 0506  . heparin 1,300 Units/hr (01/07/14 1248)    Labs:  Recent Labs  01/06/14 0508 01/07/14 0730  NA 139 138  K 3.9 4.4  CL 96 98  CO2 29 25  GLUCOSE 114* 111*  BUN 24* 22  CREATININE 1.13 1.11  CALCIUM 9.3 9.1    Recent Labs  01/07/14 0730  WBC 9.1  HGB 12.8*  HCT 37.4*  MCV 93.0  PLT 262    Radiology/Studies:  Dg Chest 1 View  12/31/2013   CLINICAL DATA:  Post left thoracentesis  EXAM: CHEST - 1 VIEW  COMPARISON:  12/1913  FINDINGS: There is significant decreased opacity on the left reflecting evacuation of most of the left pleural effusion. There is no pneumothorax. Opacity at the left lung base may reflect infiltrate or atelectasis.  There is vascular congestion centrally throughout the right lung with mild diffuse hazy opacity similar to the prior study.  IMPRESSION: No pneumothorax following thoracentesis on the left. Marked reduction in left pleural effusion.   Electronically Signed   By: Lajean Manes M.D.   On: 12/31/2013 14:32   Dg Chest 2 View  12/30/2013   CLINICAL DATA:  Weakness, shortness of breath.  EXAM: CHEST  2 VIEW  COMPARISON:  12/10/2013  FINDINGS: Moderate left pleural effusion, stable since prior study. Left lower lobe atelectasis or infiltrate. Diffuse opacities now project throughout both lungs, likely mild edema. Heart is borderline in size.  Advanced degenerative changes in the right shoulder. The previously described left seventh rib lesion  not visualized on this study.  IMPRESSION: Moderate left pleural effusion with left lower lobe atelectasis or infiltrate. Diffuse opacities throughout the lungs may reflect new pulmonary edema.   Electronically Signed   By: Rolm Baptise M.D.   On: 12/30/2013 19:27   Dg Chest 2 View  12/10/2013   CLINICAL DATA:  Shortness of breath with cough. Recent pneumonia. Metastatic prostate cancer.  EXAM: CHEST  2 VIEW  COMPARISON:  Radiographs 09/13/2013. Whole body bone scan 09/19/2013.  FINDINGS:  Left-greater-than-right pleural effusions have enlarged. There is increased left basilar airspace disease without consolidation. There is no edema. The heart size and mediastinal contours are stable. There is sclerosis of the left 7th rib laterally which may correspond with the focal uptake on recent bone scan. Severe glenohumeral arthropathy is noted on the right.  IMPRESSION: Enlarging left-greater-than-right pleural effusions with associated left lower lobe airspace disease. This may reflect atelectasis or any infiltrate. Possible metastasis to the left 7th rib laterally.   Electronically Signed   By: Camie Patience M.D.   On: 12/10/2013 14:57   US Thoracentesis Asp Pleural Space W/img Guide  12/31/2013   CLINICAL DATA:  Shortness of breath, left-sided pleural effusion. Request diagnostic and therapeutic thoracentesis.  EXAM: ULTRASOUND GUIDED left THORACENTESIS  COMPARISON:  None.  FINDINGS: A total of approximately 2 L of clear yellow fluid was removed. A fluid sample wassent for laboratory analysis.  IMPRESSION: Successful ultrasound guided left thoracentesis yielding 2 L of pleural fluid.  Read by: Ascencion Dike PA-C  PROCEDURE: An ultrasound guided thoracentesis was thoroughly discussed with the patient and questions answered. The benefits, risks, alternatives and complications were also discussed. The patient understands and wishes to proceed with the procedure. Written consent was obtained.  Ultrasound was performed to localize and mark an adequate pocket of fluid in the left chest. The area was then prepped and draped in the normal sterile fashion. 1% Lidocaine was used for local anesthesia. Under ultrasound guidance a 19 gauge Yueh catheter was introduced. Thoracentesis was performed. The catheter was removed and a dressing applied.  Complications:  None immediate   Electronically Signed   By: Maryclare Bean M.D.   On: 12/31/2013 14:23   2D Echo 01/01/14 - Left ventricle: The cavity size was moderately to  severely dilated. Wall thickness was normal. Systolic function was moderately to severely reduced. The estimated ejection fraction was in the range of 30% to 35%. Regional wall motion abnormalities cannot be excluded. Features are consistent with a pseudonormal left ventricular filling pattern, with concomitant abnormal relaxation and increased filling pressure (grade 2 diastolic dysfunction). - Aortic valve: There was severe stenosis. Mild regurgitation. Valve area: 0.67cm^2(VTI). Valve area: 0.68cm^2 (Vmax). - Left atrium: The atrium was mildly dilated.   Assessment and Plan  1. Severe symptomatic aortic stenosis - see Dr. Antionette Char consult note from yesterday. Getting CTA chest/abd/pelvis and cardiac CT, pending PT eval and surgical consultation. Per Dr. Burt Knack, saw Dr. Roxy Manns on 1/23 and will see Dr. Cyndia Bent on Wednesday (CVTS coordinating behind the scenes).  2. Acute combined systolic/diastolic CHF with new LV dysfunction EF 30-35% by echo 1/21 felt most likely due to AS (previously 60% in 03/2013); normal coronaries 01/06/14 - off Lasix pending CT scans today. Weight stable, appears euvolemic. Not on ACEI due to borderline BP's in setting of AS.  3. Paroxysmal atrial fibrillation with history of stroke 03/2013 - remains NSR. CHADSVASC =6. Not a candidate for NOAC given valve disease. On IV heparin pending transition to  Coumadin once it is clear all procedures complete. Continue BB as BP allows.  4. Metastatic prostate cancer to bones-  seen by onc, prognosis >1 yr.  5. L pleural effusion - s/p thora 12/2012, cytology from thoracentesis negative.  6. Moderate protein-calorie malnutrition - continue Resource.  7. Elevated TSH due to sick euthyroid - free T4, T3 wnl.  Signed, Melina Copa PA-C  I have personally seen and examined this patient with Melina Copa, PA-C. I agree with the assessment and plan as outlined above. Workup in progress for possible TAVR. Continue heparin for now. Pending  testing, possible TAVR next week.   MCALHANY,CHRISTOPHER 01/07/2014 1:40 PM

## 2014-01-08 ENCOUNTER — Ambulatory Visit: Payer: Medicare Other | Admitting: Gastroenterology

## 2014-01-08 DIAGNOSIS — I359 Nonrheumatic aortic valve disorder, unspecified: Secondary | ICD-10-CM

## 2014-01-08 LAB — BASIC METABOLIC PANEL
BUN: 20 mg/dL (ref 6–23)
CALCIUM: 9.1 mg/dL (ref 8.4–10.5)
CO2: 23 mEq/L (ref 19–32)
CREATININE: 1.26 mg/dL (ref 0.50–1.35)
Chloride: 101 mEq/L (ref 96–112)
GFR calc Af Amer: 61 mL/min — ABNORMAL LOW (ref >90)
GFR, EST NON AFRICAN AMERICAN: 52 mL/min — AB (ref >90)
Glucose, Bld: 106 mg/dL — ABNORMAL HIGH (ref 70–99)
Potassium: 4.3 mEq/L (ref 3.7–5.3)
SODIUM: 140 meq/L (ref 137–147)

## 2014-01-08 LAB — CBC
HCT: 36.3 % — ABNORMAL LOW (ref 39.0–52.0)
HEMOGLOBIN: 12.5 g/dL — AB (ref 13.0–17.0)
MCH: 32.3 pg (ref 26.0–34.0)
MCHC: 34.4 g/dL (ref 30.0–36.0)
MCV: 93.8 fL (ref 78.0–100.0)
Platelets: 247 10*3/uL (ref 150–400)
RBC: 3.87 MIL/uL — ABNORMAL LOW (ref 4.22–5.81)
RDW: 13.8 % (ref 11.5–15.5)
WBC: 8.8 10*3/uL (ref 4.0–10.5)

## 2014-01-08 LAB — HEPARIN LEVEL (UNFRACTIONATED): HEPARIN UNFRACTIONATED: 0.32 [IU]/mL (ref 0.30–0.70)

## 2014-01-08 MED ORDER — FUROSEMIDE 40 MG PO TABS
40.0000 mg | ORAL_TABLET | Freq: Two times a day (BID) | ORAL | Status: DC
  Administered 2014-01-09 (×2): 40 mg via ORAL
  Filled 2014-01-08 (×5): qty 1

## 2014-01-08 MED ORDER — ASPIRIN 81 MG PO CHEW
81.0000 mg | CHEWABLE_TABLET | Freq: Every day | ORAL | Status: DC
  Administered 2014-01-08 – 2014-01-13 (×6): 81 mg via ORAL
  Filled 2014-01-08 (×6): qty 1

## 2014-01-08 NOTE — Progress Notes (Signed)
ANTICOAGULATION CONSULT NOTE - Follow Up Consult  Pharmacy Consult for heparin Indication: atrial fibrillation  Allergies  Allergen Reactions  . Other     Beer and Wine- swelling/headache/nausea    Patient Measurements: Height: 6\' 2"  (188 cm) Weight: 180 lb 5.4 oz (81.8 kg) IBW/kg (Calculated) : 82.2 Heparin Dosing Weight: 82.2 kg  Vital Signs: Temp: 98.3 F (36.8 C) (01/28 0738) Temp src: Oral (01/28 0738) BP: 102/55 mmHg (01/28 0738) Pulse Rate: 75 (01/28 0738)  Labs:  Recent Labs  01/06/14 0508 01/07/14 0730 01/07/14 0920 01/07/14 1930 01/08/14 0340  HGB  --  12.8*  --   --  12.5*  HCT  --  37.4*  --   --  36.3*  PLT  --  262  --   --  247  HEPARINUNFRC 0.56  --  0.25* 0.41 0.32  CREATININE 1.13 1.11  --   --  1.26    Estimated Creatinine Clearance: 55 ml/min (by C-G formula based on Cr of 1.26).   Medications:  Scheduled:  . feeding supplement (RESOURCE BREEZE)  1 Container Oral BID BM  . metoprolol succinate  25 mg Oral Daily  . multivitamin with minerals  1 tablet Oral Daily  . pantoprazole  40 mg Oral Daily  . potassium chloride  20 mEq Oral BID  . protein supplement  1 scoop Oral BID WC  . simvastatin  20 mg Oral q1800   Infusions:  . sodium chloride 10 mL/hr at 01/08/14 0400  . heparin 1,300 Units/hr (01/08/14 0400)    Assessment: 80 yo male with afib is currently on therapeutic heparin.  Heparin level is 0.32. CBC is stable. No bleeding noted.   Goal of Therapy:  Heparin level 0.3-0.7 units/ml Monitor platelets by anticoagulation protocol: Yes   Plan:  1) Continue heparin at 1300 units/hr 2) Daily heparin level and CBC 3) Follow up for plan for long term oral anticoagulation.  Nicole Cella, RPh Clinical Pharmacist Pager: [phone]  01/08/2014,9:22 AM

## 2014-01-08 NOTE — Progress Notes (Signed)
Occupational Therapy Treatment Patient Details Name: Lucas Green MRN: [id number] DOB: 25-Dec-1931 Today's Date: 01/08/2014 Time: 6599-3570 OT Time Calculation (min): 39 min  OT Assessment / Plan / Recommendation  History of present illness 80 yo male admitted with weakness, pleural effusion, FTT. Hx of HTN, CVA, arthritis, prostate cancer with mets, GERD.  Work up for TAVR.     OT comments  Pt making progress and states that he feels better than when he was first admitted to hospital. Pt scheduled for heart valve surgery next week. Physician in to see pt during tx session. Pt to continue with acute OT services to address impairments to increase level of function and safety  Follow Up Recommendations  CIR;Supervision/Assistance - 24 hour    Barriers to Discharge   none    Equipment Recommendations  3 in 1 bedside comode    Recommendations for Other Services    Frequency Min 2X/week   Progress towards OT Goals Progress towards OT goals: Progressing toward goals  Plan Discharge plan remains appropriate    Precautions / Restrictions Precautions Precautions: Fall Restrictions Weight Bearing Restrictions: No   Pertinent Vitals/Pain 3/10 L knee, lower back    ADL  Grooming: Wash/dry hands;Wash/dry face;Min guard Where Assessed - Grooming: Supported standing Lower Body Bathing: Simulated;Minimal assistance;Min guard Where Assessed - Lower Body Bathing: Unsupported sitting;Supported sit to stand;Supported standing Toilet Transfer: Pharmacologist Method: Sit to stand Toileting - Water quality scientist and Hygiene: Minimal assistance Where Assessed - Best boy and Hygiene: Standing Transfers/Ambulation Related to ADLs: Assist to rise, stabilize and control descent.  Verbal cues for safety and technique as well as hand placement.  No reports of dizziness during sit - stand, standing ADL Comments: no reports of dizziness sitting  EOB for ADLs. Pt required increased time to complete tasks, extended rest periods    OT Diagnosis:    OT Problem List:   OT Treatment Interventions:     OT Goals(current goals can now be found in the care plan section) Acute Rehab OT Goals Patient Stated Goal: to be able to get up and get stronger  Visit Information  Last OT Received On: 01/08/14 Assistance Needed: +1 History of Present Illness: 80 yo male admitted with weakness, pleural effusion, FTT. Hx of HTN, CVA, arthritis, prostate cancer with mets, GERD.  Work up for TAVR.      Subjective Data      Prior Functioning       Cognition  Cognition Arousal/Alertness: Awake/alert Behavior During Therapy: WFL for tasks assessed/performed Overall Cognitive Status: Within Functional Limits for tasks assessed    Mobility  Bed Mobility Overal bed mobility: Needs Assistance Bed Mobility: Supine to Sit;Sit to Supine Supine to sit: Supervision;HOB elevated Sit to supine: Supervision General bed mobility comments: used rails Transfers Overall transfer level: Needs assistance Transfers: Sit to/from Stand Sit to Stand: Min guard General transfer comment: Assist to rise, stabilize and control descent.  Verbal cues for safety and technique as well as hand placement.            Balance Balance Overall balance assessment: Needs assistance Sitting-balance support: Bilateral upper extremity supported;Feet supported Sitting balance-Leahy Scale: Good Standing balance support: Single extremity supported;No upper extremity supported;During functional activity Standing balance-Leahy Scale: Fair (min A - min guard A)  End of Session OT - End of Session Equipment Utilized During Treatment: Gait belt Activity Tolerance: Patient tolerated treatment well Patient left: in bed;with call bell/phone within reach;with bed alarm set  GO  Emmit Alexanders [facility] 01/08/2014, 3:12 PM

## 2014-01-08 NOTE — Progress Notes (Addendum)
[address]       [place] [address]             [phone]     CARDIOTHORACIC SURGERY PROGRESS NOTE  2 Days Post-Op  S/P Procedure(s) (LRB): LEFT AND RIGHT HEART CATHETERIZATION WITH CORONARY ANGIOGRAM (N/A)  Subjective: Feels well.  No SOB.  Eating well.  Objective: Vital signs in last 24 hours: Temp:  [97.6 F (36.4 C)-98.3 F (36.8 C)] 98.1 F (36.7 C) (01/28 1653) Pulse Rate:  [73-81] 81 (01/28 1653) Cardiac Rhythm:  [-] Normal sinus rhythm (01/28 0830) Resp:  [15-20] 20 (01/28 1653) BP: (96-103)/(47-57) 98/57 mmHg (01/28 1653) SpO2:  [95 %-98 %] 98 % (01/28 1653) Weight:  [81.8 kg (180 lb 5.4 oz)] 81.8 kg (180 lb 5.4 oz) (01/28 0032)  Physical Exam:  Rhythm:   sinus  Breath sounds: clear  Heart sounds:  RRR w/ systolic murmur  Incisions:  n/a  Abdomen:  Soft, non-distended, non-tender  Extremities:  Warm, well-perfused    Intake/Output from previous day: 01/27 0701 - 01/28 0700 In: 1482.9 [P.O.:960; I.V.:522.9] Out: 2200 [Urine:2200] Intake/Output this shift: Total I/O In: 360 [P.O.:360] Out: -   Lab Results:  Recent Labs  01/07/14 0730 01/08/14 0340  WBC 9.1 8.8  HGB 12.8* 12.5*  HCT 37.4* 36.3*  PLT 262 247   BMET:  Recent Labs  01/07/14 0730 01/08/14 0340  NA 138 140  K 4.4 4.3  CL 98 101  CO2 25 23  GLUCOSE 111* 106*  BUN 22 20  CREATININE 1.11 1.26  CALCIUM 9.1 9.1    CBG (last 3)  No results found for this basename: GLUCAP,  in the last 72 hours PT/INR:  No results found for this basename: LABPROT, INR,  in the last 72 hours   CT Angiograms:  Cardiac TAVR CT  TECHNIQUE:  The patient was scanned on a Philips 256 scanner. A 120 kV  retrospective scan was triggered in the descending thoracic aorta at  111 HU's. Gantry rotation speed was 270 msecs and collimation was .9  mm. No beta blockade or nitro were given. The 3D data set was  reconstructed in 5% intervals of the R-R cycle. Systolic and    diastolic phases were analyzed on a dedicated work station using  MPR, MIP and VRT modes. The patient received 80 cc of contrast.  FINDINGS:  Aortic Valve: Thickened and calcified aortic valve, predominantly of  the left and non-coronary leaflet. Moderately to severely limited  leaflet opening. No calcifications in the LVOT bellow the annulus.  Aorta: Normal size, Ascending aorta is free of calcifications, there  are mild calcifications around the origin of brachiocephalic  arteries non-obstructing flow.  Sinotubular Junction: 26 x 25 mm  Ascending Thoracic Aorta: 32 x 32 mm  Aortic Arch: 25 x 24 mm  Descending Thoracic Aorta: 24 x 24 mm  Sinus of Valsalva Measurements:  Non-coronary: 30 mm  Right -coronary: 30 mm  Left -coronary: 32 mm  Coronary Artery Height above Annulus:  Left Main: 13 mm  Right Coronary: 15 mm  Virtual Basal Annulus Measurements:  Maximum/Minimum Diameter: 26 x 23 mm  Perimeter: 86 mm  Area: 494 mm2  Optimum Fluoroscopic Angle for Delivery: LAO 6 CAU 6  IMPRESSION:  1. Severely thickened and calcified aortic stenosis with limited  leaflet excursions and measurements suitable for 26 mm  Edwards-SAPIEN valve.  2. Optimal deployment angle LAO 6 CAU 6.  3. Sufficient distance from  annulus to both coronary arteries for 26  mm valve.  Lucas Green  Electronically Signed  By: Lucas Green  On: 01/07/2014 19:48       Study Result    EXAM:  OVER-READ INTERPRETATION CT CHEST  The following report is an over-read performed by radiologist Dr.  Rebekah Green [facility] on 01/07/2014. This  over-read does not include interpretation of cardiac or coronary  anatomy or pathology. The interpretation by the cardiologist is  attached.  COMPARISON: No priors.  FINDINGS:  Small right and moderate left-sided pleural effusions with  associated passive atelectasis in the lower lobes of the lungs  bilaterally. No definite acute consolidative  airspace disease.  Visualized portions of the upper abdomen are unremarkable. Sclerotic  lesion with healing pathologic fracture in the lateral aspect of the  left seventh rib.  IMPRESSION:  1. Small right and moderate left-sided pleural effusions.  2. Passive atelectasis in the lower lobes of the lungs bilaterally.  3. Healing pathologic fracture of the lateral aspect of the left  sixth rib where there is presumably a metastatic lesion in this  patient with history of metastatic prostate cancer.  Electronically Signed:  By: [place] Green M.D.  On: 01/07/2014 14:52    CT ANGIOGRAPHY CHEST, ABDOMEN AND PELVIS  TECHNIQUE:  Multidetector CT imaging through the chest, abdomen and pelvis was  performed using the standard protocol during bolus administration of  intravenous contrast. Multiplanar reconstructed images and MIPs were  obtained and reviewed to evaluate the vascular anatomy.  CONTRAST: 167mL OMNIPAQUE IOHEXOL 350 MG/ML SOLN  COMPARISON: CT of abdomen and pelvis 08/20/2012.  FINDINGS:  CT CHEST FINDINGS  Mediastinum: Heart size is normal. Mild calcifications of the mitral  annulus. Severe calcifications and thickening of the aortic valve.  Small amount of pericardial fluid and/or thickening, unlikely to be  of hemodynamic significance at this time. There is atherosclerosis  of the thoracic aorta, the great vessels of the mediastinum and the  coronary arteries, including calcified atherosclerotic plaque in the  left main, left anterior descending, left circumflex and right  coronary arteries. No pathologically enlarged mediastinal or hilar  lymph nodes. Esophagus is unremarkable in appearance.  Lungs/Pleura: Small right and moderate left pleural effusions with  associated passive atelectasis in the lower lobes of the lungs  bilaterally. No acute consolidative airspace disease. Mild diffuse  bronchial wall thickening. No definite suspicious appearing  pulmonary nodules or  masses are identified at this time.  Musculoskeletal: Sclerotic lesion with healing pathologic fracture  in the lateral aspect of the left seventh rib. Multiple well-defined  lucent lesions associated with the endplates throughout the thoracic  spine, favored to represent Schmorl's nodes.  CT ABDOMEN AND PELVIS FINDINGS  Abdomen/Pelvis: The appearance of the liver, gallbladder, pancreas,  spleen, bilateral adrenal glands and bilateral kidneys is  unremarkable. No significant volume of ascites. No pneumoperitoneum.  No pathologic distention of small bowel. No definite lymphadenopathy  identified within the abdomen or pelvis. Prostate calcifications.  Foley balloon catheter in place with tip in the lumen of the urinary  bladder. Urinary bladder wall appears markedly thickened, however,  the urinary bladder is completely collapsed.  Musculoskeletal: Numerous aggressive appearing sclerotic lesions are  noted throughout the lumbar spine, bony pelvis and right proximal  femur. Largest lesions include a sclerotic mass in the right ischium  which appears to have an associated pathologic fracture, measuring  at least 5.5 cm. There is also a pathologic fracture of the right  superior and  inferior pubic rami. Sclerotic lesion in the superior  aspect of the right ilium with pathologic fracture.  VASCULAR MEASUREMENTS PERTINENT TO TAVR:  AORTA:  Minimal Aortic Diameter - 11 x 13 mm in the infrarenal abdominal  aorta.  Severity of Aortic Calcification - Severe (circumferential).  RIGHT PELVIS:  Right Common Iliac Artery -  Minimal Diameter - 7.6 x 5.6 mm  Tortuosity - Minimal  Calcification - Severe  Right External Iliac Artery -  Minimal Diameter - 7.0 x 8.3 mm  Tortuosity - Mild  Calcification - Minimal  Right Common Femoral Artery -  Minimal Diameter - 8.1 x 6.3 mm  Tortuosity - Mild  Calcification - Moderate  LEFT PELVIS:  Left Common Iliac Artery -  Minimal Diameter - 5.7 x 4.1 mm at  site of short segment  (nonpropagating) dissection.  Tortuosity - Minimal  Calcification - Severe  Left External Iliac Artery -  Minimal Diameter - 6.6 x 7.7 mm  Tortuosity - Mild  Calcification - Minimal  Left Common Femoral Artery -  Minimal Diameter - 6.7 x 9.1 mm  Tortuosity - Mild  Calcification - Moderate  Review of the MIP images confirms the above findings.  IMPRESSION:  1. Vascular findings and measurements pertinent to potential TAVR  procedure, as above. This patient does have potentially suitable  pelvic arterial access, likely more preferable on the right given  the short segment dissection of the left common iliac artery  (minimal diameter of 5.7 x 4.1 mm at this level).  2. Multiple aggressive appearing bony lesions compatible with  metastatic prostate cancer to the bone, most notably in the right  hemipelvis where there are numerous pathologic fractures, as  detailed above.  3. Small right and moderate left-sided pleural effusions.  4. Additional incidental findings, as above.  Electronically Signed:  By: Lucas Green M.D.  On: 01/07/2014 14:50    Cardiac Catheterization Procedure Note  Name: Lucas Green  MRN: [id number]  DOB: 19-Jan-1932  Procedure: Right Heart Cath, Left Heart Cath, Selective Coronary Angiography  Indication: Severe aortic stenosis  Procedural Details: The right groin was prepped, draped, and anesthetized with 1% lidocaine. Using the modified Seldinger technique a 5 French sheath was placed in the right femoral artery and a 6 French sheath was placed in the right femoral vein. A multipurpose catheter was used for the right heart catheterization. Standard protocol was followed for recording of right heart pressures and sampling of oxygen saturations. Fick cardiac output was calculated. Standard Judkins catheters were used for selective coronary angiography and left ventriculography. There were no immediate procedural complications. The  patient was transferred to the post catheterization recovery area for further monitoring.  Procedural Findings:  Hemodynamics  RA 7  RV 39/8  PA 38/18 mean 25  PCWP 13 mean  LV 158/16  AO 122/58  Oxygen saturations:  PA 65  AO 92  SVC 61  Cardiac Output (Fick) 5.2  Cardiac Index (Fick) 2.5  Aortic Valve Hemodynamics: Peak-peak gradient 35, mean gradient 30 mm Hg, AVA 1.0 square cm  Coronary angiography:  Coronary dominance: right  Left mainstem: The left mainstem is patent. There is mild 20-30% ostial stenosis. The vessel was calcified. There is also 30% stenosis just before the trifurcation of the intermediate branch, circumflex, and LAD.  Left anterior descending (LAD): The LAD is widely patent throughout. The vessel wraps around the left ventricular apex. There are diffuse irregularities but no significant stenoses. The diagonal branches are small without significant disease.  Left circumflex (LCx): The left circumflex is patent. The intermediate branch is fairly large in caliber and it divides into twin vessels in its midportion. There is no significant disease noted. The AV circumflex has mild 30% stenosis involving the first obtuse marginal branch. There is no significant stenosis throughout the left circumflex distribution.  Right coronary artery (RCA): The right coronary artery is dominant. The vessel is normal in caliber. There is minimal calcification present. There are minor irregularities but no evidence of high-grade stenosis. The PDA and PLA branches are patent.  Left ventriculography: Deferred  Aortic root angiography: The aortic valve is severely calcified. There is restricted opening. There is mild aortic insufficiency. The proximal Ascending aorta appears normal in caliber.  Final Conclusions:  1. Patent coronary arteries with nonobstructive CAD present  2. Severe calcific aortic stenosis  3. Known left ventricular systolic dysfunction  4. Compensated intracardiac  hemodynamics   Assessment/Plan: S/P Procedure(s) (LRB): LEFT AND RIGHT HEART CATHETERIZATION WITH CORONARY ANGIOGRAM (N/A)  Results of cath and CT angiograms discussed with patient.  I agree with plan outlined by Dr Burt Knack.  Tentatively for TAVR on Tuesday February 3rd via transfemoral approach.  Connor Meacham H 01/08/2014 5:21 PM

## 2014-01-08 NOTE — Progress Notes (Addendum)
Subjective:  Patient doing well today. He denies chest pain or shortness of breath. His appetite is improving.  Objective:  Vital Signs in the last 24 hours: Temp:  [97.3 F (36.3 C)-98.3 F (36.8 C)] 98.3 F (36.8 C) (01/28 0738) Pulse Rate:  [73-82] 75 (01/28 0738) Resp:  [15-20] 20 (01/28 0738) BP: (96-103)/(47-57) 102/55 mmHg (01/28 0738) SpO2:  [95 %-99 %] 98 % (01/28 0738) Weight:  [180 lb 5.4 oz (81.8 kg)-180 lb 14.4 oz (82.056 kg)] 180 lb 5.4 oz (81.8 kg) (01/28 0032)  Intake/Output from previous day: 01/27 0701 - 01/28 0700 In: 1482.9 [P.O.:960; I.V.:522.9] Out: 2200 [Urine:2200]  Physical Exam: Pt is alert and oriented, NAD HEENT: normal Neck: JVP - normal, carotids delayed Lungs: CTA bilaterally CV: RRR with grade 3/6 harsh systolic murmur at the LSB Abd: soft, NT, Positive BS, no hepatomegaly Ext: no C/C/E, distal pulses intact and equal Skin: warm/dry no rash   Lab Results:  Recent Labs  01/07/14 0730 01/08/14 0340  WBC 9.1 8.8  HGB 12.8* 12.5*  PLT 262 247    Recent Labs  01/07/14 0730 01/08/14 0340  NA 138 140  K 4.4 4.3  CL 98 101  CO2 25 23  GLUCOSE 111* 106*  BUN 22 20  CREATININE 1.11 1.26   No results found for this basename: TROPONINI, CK, MB,  in the last 72 hours  Cardiac Studies: Gated cardiac CTA: FINDINGS:  Aortic Valve: Thickened and calcified aortic valve, predominantly of  the left and non-coronary leaflet. Moderately to severely limited  leaflet opening. No calcifications in the LVOT bellow the annulus.  Aorta: Normal size, Ascending aorta is free of calcifications, there  are mild calcifications around the origin of brachiocephalic  arteries non-obstructing flow.  Sinotubular Junction: 26 x 25 mm  Ascending Thoracic Aorta: 32 x 32 mm  Aortic Arch: 25 x 24 mm  Descending Thoracic Aorta: 24 x 24 mm  Sinus of Valsalva Measurements:  Non-coronary: 30 mm  Right -coronary: 30 mm  Left -coronary: 32 mm  Coronary  Artery Height above Annulus:  Left Main: 13 mm  Right Coronary: 15 mm  Virtual Basal Annulus Measurements:  Maximum/Minimum Diameter: 26 x 23 mm  Perimeter: 86 mm  Area: 494 mm2  Optimum Fluoroscopic Angle for Delivery: LAO 6 CAU 6  IMPRESSION:  1. Severely thickened and calcified aortic stenosis with limited  leaflet excursions and measurements suitable for 26 mm  Edwards-SAPIEN valve.  2. Optimal deployment angle LAO 6 CAU 6.  3. Sufficient distance from annulus to both coronary arteries for 26  mm valve.  Ena Dawley  CT angiogram of the chest/abdomen/pelvis: VASCULAR MEASUREMENTS PERTINENT TO TAVR:  AORTA:  Minimal Aortic Diameter - 11 x 13 mm in the infrarenal abdominal  aorta.  Severity of Aortic Calcification - Severe (circumferential).  RIGHT PELVIS:  Right Common Iliac Artery -  Minimal Diameter - 7.6 x 5.6 mm  Tortuosity - Minimal  Calcification - Severe  Right External Iliac Artery -  Minimal Diameter - 7.0 x 8.3 mm  Tortuosity - Mild  Calcification - Minimal  Right Common Femoral Artery -  Minimal Diameter - 8.1 x 6.3 mm  Tortuosity - Mild  Calcification - Moderate  LEFT PELVIS:  Left Common Iliac Artery -  Minimal Diameter - 5.7 x 4.1 mm at site of short segment  (nonpropagating) dissection.  Tortuosity - Minimal  Calcification - Severe  Left External Iliac Artery -  Minimal Diameter - 6.6 x 7.7  mm  Tortuosity - Mild  Calcification - Minimal  Left Common Femoral Artery -  Minimal Diameter - 6.7 x 9.1 mm  Tortuosity - Mild  Calcification - Moderate  Review of the MIP images confirms the above findings.  IMPRESSION:  1. Vascular findings and measurements pertinent to potential TAVR  procedure, as above. This patient does have potentially suitable  pelvic arterial access, likely more preferable on the right given  the short segment dissection of the left common iliac artery  (minimal diameter of 5.7 x 4.1 mm at this level).  2. Multiple  aggressive appearing bony lesions compatible with  metastatic prostate cancer to the bone, most notably in the right  hemipelvis where there are numerous pathologic fractures, as  detailed above.  3. Small right and moderate left-sided pleural effusions.  4. Additional incidental findings, as above.   Tele: Sinus rhythm  Assessment/Plan:  1. Severe aortic stenosis 2. Acute systolic heart failure with severe left ventricular dysfunction 3. Paroxysmal atrial fibrillation, maintaining sinus rhythm 4. Metastatic prostate CA - bone 5. Moderate protein-calorie malnutrition  The patient is stable from a cardiac perspective. His blood pressure remains low and I do not think we can uptitrate his medical therapy for heart failure. He is only taking metoprolol succinate 25 mg at present. Will add back oral furosemide today as he has been fairly aggressively hydrated over the past few days to prevent contrast nephropathy. Plan to transfer him to 2 W. with continued physical therapy and monitoring. He remains on IV heparin with PAF and hx stroke. With plans for surgery next week will not start warfarin until after TAVR. PT assessment reviewed and moderate frailty noted (frailty index 6). We plan on proceeding with transcatheter aortic valve replacement next week. Will review his CT images with Dr Roxy Manns, but tentatively plan right transfemoral access. Plan reviewed with patient who is in agreement.  Sherren Mocha, M.D. 01/08/2014, 9:54 AM

## 2014-01-08 NOTE — Consult Note (Signed)
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 He suffered a stroke with left hemiparesis last April that was felt to be due to treatment for his metastatic prostate cancer. He recovered completely and was considering going back to work in October but began developing shortness of breath, generalized weakness, anorexia and weight loss. He also developed some dizziness and lower extremity edema. He was admitted to [facility] on 12/30/2013 with shortness of breath and failure to thrive. An echo showed severe AS with a peak velocity of 4 m/s across the aortic valve with peak and mean gradients of 66 and 43 mm mercury . His EF was reduced to 30-35%. He improved with diuresis and consideration of surgical treatment for his aortic valve was entertained.    Past Medical History  Diagnosis Date  . Hypertension   . GERD (gastroesophageal reflux disease)   . Dyslipidemia   . Aortic stenosis   . Prostate cancer 09/2009  . 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 Hearing loss   . Incontinence of urine   . 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 Degenerative arthritis     osteoarthritis  . Stroke 03/29/13  . Hx of radiation therapy 09/30/13- 10/11/13    right hip/ischium, 3000 cGy 10 sessions  . Esophageal stricture   . Hiatal hernia   . Diverticulosis   . Moderate protein-calorie malnutrition 12/30/2013  . Failure to thrive 12/30/2013  . Pleural effusion, left 12/30/2013    . Severe aortic stenosis   . Atrial fibrillation   . Chronic diastolic congestive heart failure   . Acute on chronic diastolic heart failure     Past Surgical History  Procedure Laterality Date  . Tonsillectomy  1938  . Cryosurgery prostate  09/2009    prostate cancer, Gleason 7    Family History  Problem Relation Age of Onset  . Heart disease Father   . Cancer Father     prostate  . Heart attack Father   . Cancer - Colon Mother   . Thyroid disease Mother   . Stroke Maternal Grandfather   . Hypertension Maternal Grandfather   . Stroke Paternal Grandfather   . Hypertension Paternal Grandfather   . Cancer - Lung Cousin     History   Social History  . Marital Status: Married    Spouse Name: N/A    Number of Children: 1  . Years of Education: MD   Occupational History  . Not on file.   Social History Main Topics  . Smoking status: Never Smoker   . Smokeless tobacco: Never Used  . Alcohol Use: Yes     Comment: occassionally  . Drug Use: No  . Sexual Activity: Not on file   Other Topics Concern  . Not on file   Social History Narrative  . No narrative on file  He lives at home with his wife. He has been fairly active and independent until just recently. He has had 2 recent falls at home due to severe weakness in his legs and unstable gait.  Current Facility-Administered Medications  Medication Dose Route Frequency Provider Last Rate Last Dose  . 0.9 %  sodium chloride infusion   Intravenous Continuous Velvet Bathe, MD 10 mL/hr at 01/08/14 0700    . acetaminophen (TYLENOL) tablet 650 mg  650 mg Oral Q4H PRN Sherren Mocha, MD      . aspirin chewable tablet 81 mg  81 mg Oral Daily Sherren Mocha, MD      . feeding supplement (RESOURCE BREEZE) (RESOURCE BREEZE) liquid 1 Container  1 Container Oral BID BM Hazle Coca, RD   1 Container at 01/07/14 2000  . heparin ADULT infusion 100 units/mL (25000 units/250 mL)  1,300 Units/hr Intravenous Continuous Jaquita Folds, RPH 13 mL/hr at 01/08/14 1400 1,300 Units/hr at 01/08/14 1400  . metoprolol succinate (TOPROL-XL) 24 hr tablet 25 mg  25 mg Oral Daily Lelon Perla, MD   25 mg at 01/08/14 0930  . multivitamin with minerals tablet 1 tablet  1 tablet Oral Daily Theodis Blaze, MD   1 tablet at 01/08/14 0930  . ondansetron (ZOFRAN) injection 4 mg  4 mg Intravenous Q6H PRN Sherren Mocha, MD      . oxyCODONE-acetaminophen (PERCOCET/ROXICET) 5-325 MG per tablet 1-2 tablet  1-2 tablet Oral Q3H PRN Theodis Blaze, MD      . pantoprazole (PROTONIX) EC tablet 40 mg  40 mg Oral Daily Theodis Blaze, MD   40 mg at 01/08/14 0933  . potassium chloride SA (K-DUR,KLOR-CON) CR tablet 20 mEq  20 mEq Oral BID Lelon Perla, MD   20 mEq at 01/08/14 0929  . protein supplement (RESOURCE BENEPROTEIN) powder packet 6 g  1 scoop Oral BID WC Hazle Coca, RD   6 g at 01/08/14 0830  . simvastatin (ZOCOR) tablet 20 mg  20 mg Oral q1800 Theodis Blaze, MD   20 mg at 01/07/14 1649    Allergies  Allergen Reactions  . Other     Beer and Wine- swelling/headache/nausea    Review of Systems:  General: decreased appetite, decreased energy, no weight gain, + weight loss, no fever  Cardiac: no chest pain with exertion, no chest pain at rest, + SOB with exertion, + resting SOB at the time of admission, no PND, no orthopnea, + palpitations, + arrhythmia, + atrial fibrillation, + LE edema, no dizzy spells, no syncope  Respiratory: + shortness of breath, no home oxygen, no productive cough, no dry cough, no bronchitis, no wheezing, no hemoptysis, no asthma, no pain with inspiration or cough, no sleep apnea, no CPAP at night  GI: no difficulty swallowing, + reflux, no frequent heartburn, no hiatal hernia, no abdominal pain, no constipation, no diarrhea, no hematochezia, no hematemesis, no melena  GU: no dysuria, no frequency, no urinary tract infection, no hematuria, no enlarged prostate, no kidney stones, no kidney disease    Vascular: no pain suggestive of claudication, no pain in feet, no leg cramps, no varicose veins, no DVT, no non-healing foot ulcer  Neuro: + stroke, no TIA's, no seizures, no headaches, no temporary blindness one eye, no slurred speech, no peripheral neuropathy, no chronic pain, + instability of gait, no memory/cognitive dysfunction  Musculoskeletal: no arthritis, no joint swelling, no myalgias, + some difficulty walking, decreased mobility  Skin: no  rash, no itching, no skin infections, no pressure sores or ulcerations  Psych: no anxiety, no depression, no nervousness, no unusual recent stress  Eyes: no blurry vision, no floaters, no recent vision changes, no wears glasses or contacts  ENT: no hearing loss, no loose or painful teeth, no dentures, last saw dentist 1 year ago  Hematologic: no easy bruising, no abnormal bleeding, no clotting disorder, no frequent epistaxis  Endocrine: no diabetes, does not check CBG's at home      Physical Exam:   BP 98/57  Pulse 81  Temp(Src) 98.1 F (36.7 C) (Oral)  Resp 20  Ht 6\' 2"  (1.88 m)  Wt 81.8 kg (180 lb 5.4 oz)  BMI 23.14 kg/m2  SpO2 98%  General:  Elderly, frail-appearing gentleman in no                                                 distress  HEENT:  Unremarkable . Teeth in fair condition,                                                 PERLA, EOMI  Neck:   no JVD, no bruits, no adenopathy ,                                                 transmitted murmur to both sides of the neck  Chest:   clear to auscultation, symmetrical breath                                                 sounds, no wheezes, no rhonchi   CV:   RRR, grade III/VI crescendo/decrescendo                                                 murmur ,  no diastolic murmur  Abdomen:  soft, non-tender, no masses or organomegaly  Extremities:  warm, well-perfused, pulses palpable in feet                                                 but diminished, no LE  edema  Rectal/GU  Deferred  Neuro:   Grossly non-focal and symmetrical throughout  Skin:   Clean and dry, no rashes, no breakdown   Diagnostic Tests:  TRANSTHORACIC ECHOCARDIOGRAM   *[place]* *[facility]* [address] [place], [place] [address] [phone]  ------------------------------------------------------------  Patient: Lucas Green, Lucas Green MR #: [id number] Study Date: 01/01/2014 Gender: M Age: 65 Height: 188cm Weight: 93kg BSA: 2.63m^2 Pt. Status: Room: [address], [place], [place] SONOGRAPHER Jimmy Reel PERFORMING [facility], Inpatient cc:  ------------------------------------------------------------ LV  EF: 30% - 35%  ------------------------------------------------------------ Indications: Aortic stenosis 424.1. Hypertension - benign without CHF 402.10.  ------------------------------------------------------------ Study Conclusions  - Left ventricle: The cavity size was moderately to severely dilated. Wall thickness was normal. Systolic function was moderately to severely reduced. The estimated ejection fraction was in the range of 30% to 35%. Regional wall motion abnormalities cannot be excluded. Features are consistent with a pseudonormal left ventricular filling pattern, with concomitant abnormal relaxation and increased filling pressure (grade 2 diastolic dysfunction). - Aortic valve: There was severe stenosis. Mild regurgitation. Valve area: 0.67cm^2(VTI). Valve area: 0.68cm^2 (Vmax). - Left atrium: The atrium was mildly dilated. - Pulmonary arteries: PA peak pressure: 75mm Hg (S). Height: Height: 188cm. Height: 74in. Weight: Weight: 93kg. Weight: 204.6lb. Body mass index: BMI: 26.3kg/m^2. Body surface area: BSA: 2.58m^2. Blood pressure: 133/74. Patient status: Inpatient. Location:  Bedside.  ------------------------------------------------------------  ------------------------------------------------------------ Left ventricle: Poorly visualized. The cavity size was moderately to severely dilated. Wall thickness was normal. Systolic function was moderately to severely reduced. The estimated ejection fraction was in the range of 30% to 35%. Regional wall motion abnormalities cannot be excluded. Features are consistent with a pseudonormal left ventricular filling pattern, with concomitant abnormal relaxation and increased filling pressure (grade 2 diastolic dysfunction).  ------------------------------------------------------------ Aortic valve: Moderately thickened, moderately calcified leaflets. Doppler: There was severe stenosis. Mild regurgitation. Valve area: 0.67cm^2(VTI). Indexed valve area: 0.3cm^2/m^2 (VTI). Peak velocity ratio of LVOT to aortic valve: 0.2. Valve area: 0.68cm^2 (Vmax). Indexed valve area: 0.31cm^2/m^2 (Vmax). Mean gradient: 47mm Hg (S). Peak gradient: 7mm Hg (S).  ------------------------------------------------------------ Aorta: The aorta was normal, not dilated, and non-diseased.  ------------------------------------------------------------ Mitral valve: Doppler: Peak gradient: 27mm Hg (D).  ------------------------------------------------------------ Left atrium: The atrium was mildly dilated.  ------------------------------------------------------------ Right ventricle: The cavity size was at the upper limits of normal. Systolic function was normal.  ------------------------------------------------------------ Right atrium: The atrium was at the upper limits of normal in size.  ------------------------------------------------------------ Pericardium: There was no pericardial effusion.  ------------------------------------------------------------ Systemic veins: Inferior vena cava: The vessel was dilated;  the respirophasic diameter changes were blunted (< 50%); findings are consistent with elevated central venous pressure.  ------------------------------------------------------------ Post procedure conclusions Ascending Aorta:  - The aorta was normal, not dilated, and non-diseased.  ------------------------------------------------------------  2D measurements Normal Doppler measurements Normal Left ventricle Main pulmonary LVID ED, 66.3 mm 43-52 artery chord, Pressure, 45 mm Hg =30 PLAX S LVID ES, 56.6 mm 23-38 Left ventricle chord, Ea, med 5.7 cm/s ------ PLAX ann, tiss FS, chord, 15 % >29 DP PLAX E/Ea, med 23.8 ------ LVPW, ED 8.81 mm ------ ann, tiss 6 IVS/LVPW 0.97 <1.3 DP ratio, ED LVOT Ventricular septum Peak vel, 80.1 cm/s ------ IVS, ED 8.55 mm ------ S LVOT Aortic valve Diam, S 21 mm ------ Peak vel, 407 cm/s ------ Area 3.46 cm^2 ------ S Aorta Mean vel, 311 cm/s ------ Root diam, 33 mm ------ S ED VTI, S 94.4 cm ------ Left atrium Mean 43 mm Hg ------ AP dim 44 mm ------ gradient, AP dim 2 cm/m^2 <2.2 S index Peak 66 mm Hg ------ gradient, S Area, VTI 0.67 cm^2 ------ Area index 0.3 cm^2/m ------ (VTI) ^2 Peak vel 0.2 ------ ratio, LVOT/AV Area, Vmax 0.68 cm^2 ------ Area index 0.31 cm^2/m ------ (Vmax) ^2 Mitral valve Peak E vel 136 cm/s ------ Peak A vel 65.6 cm/s ------ Decelerati 419 ms 150-23 on time 0 Peak 7 mm Hg ------ gradient, D Peak E/A 2.1 ------ ratio Tricuspid valve Regurg 296 cm/s ------ peak vel Peak RV-RA  35 mm Hg ------ gradient, S Max regurg 296 cm/s ------ vel Systemic veins Estimated 10 mm Hg ------ CVP Right ventricle Pressure, 45 mm Hg <30 S Sa vel, 10.4 cm/s ------ lat ann, tiss DP  ------------------------------------------------------------ Prepared and Electronically Authenticated by  Darlin Coco 2015-01-21T14:46:45.030        STS Risk Calculator   Procedure AVR  Risk of Mortality 2.6%   Morbidity or Mortality 21.6%  Prolonged LOS 9.8%  Short LOS 23.2%  Permanent Stroke 2.5%  Prolonged Vent Support 13.9%  DSW Infection 0.5%  Renal Failure 5.6%  Reoperation 9.4%     Cardiac Catheterization Procedure Note  Name: Lucas Green  MRN: [id number]  DOB: 1932/06/27  Procedure: Right Heart Cath, Left Heart Cath, Selective Coronary Angiography  Indication: Severe aortic stenosis  Procedural Details: The right groin was prepped, draped, and anesthetized with 1% lidocaine. Using the modified Seldinger technique a 5 French sheath was placed in the right femoral artery and a 6 French sheath was placed in the right femoral vein. A multipurpose catheter was used for the right heart catheterization. Standard protocol was followed for recording of right heart pressures and sampling of oxygen saturations. Fick cardiac output was calculated. Standard Judkins catheters were used for selective coronary angiography and left ventriculography. There were no immediate procedural complications. The patient was transferred to the post catheterization recovery area for further monitoring.  Procedural Findings:  Hemodynamics  RA 7  RV 39/8  PA 38/18 mean 25  PCWP 13 mean  LV 158/16  AO 122/58  Oxygen saturations:  PA 65  AO 92  SVC 61  Cardiac Output (Fick) 5.2  Cardiac Index (Fick) 2.5  Aortic Valve Hemodynamics: Peak-peak gradient 35, mean gradient 30 mm Hg, AVA 1.0 square cm  Coronary angiography:  Coronary dominance: right  Left mainstem: The left mainstem is patent. There is mild 20-30% ostial stenosis. The vessel was calcified. There is also 30% stenosis just before the trifurcation of the intermediate branch, circumflex, and LAD.  Left anterior descending (LAD): The LAD is widely patent throughout. The vessel wraps around the left ventricular apex. There are diffuse irregularities but no significant stenoses. The diagonal branches are small without significant disease.  Left  circumflex (LCx): The left circumflex is patent. The intermediate branch is fairly large in caliber and it divides into twin vessels in its midportion. There is no significant disease noted. The AV circumflex has mild 30% stenosis involving the first obtuse marginal branch. There is no significant stenosis throughout the left circumflex distribution.  Right coronary artery (RCA): The right coronary artery is dominant. The vessel is normal in caliber. There is minimal calcification present. There are minor irregularities but no evidence of high-grade stenosis. The PDA and PLA branches are patent.  Left ventriculography: Deferred  Aortic root angiography: The aortic valve is severely calcified. There is restricted opening. There is mild aortic insufficiency. The proximal Ascending aorta appears normal in caliber.  Final Conclusions:  1. Patent coronary arteries with nonobstructive CAD present  2. Severe calcific aortic stenosis  3. Known left ventricular systolic dysfunction  4. Compensated intracardiac hemodynamics  Recommendations: Will continue to evaluate with the multidisciplinary valve team for treatment of aortic stenosis and congestive heart failure.  Sherren Mocha  01/06/2014, 4:38 PM    Provider Default, MD Tue Jan 07, 2014 6:46:08 PM EST       ADDENDUM REPORT: 01/07/2014 18:43  Electronically Signed  By: Ena Dawley  On: 01/07/2014 18:43  Study Result    CLINICAL DATA: 80 year old male with history of aortic stenosis.  Preprocedural study prior to potential transcatheter aortic valve  replacement (TAVR) procedure.  EXAM:  CT ANGIOGRAPHY CHEST, ABDOMEN AND PELVIS  TECHNIQUE:  Multidetector CT imaging through the chest, abdomen and pelvis was  performed using the standard protocol during bolus administration of  intravenous contrast. Multiplanar reconstructed images and MIPs were  obtained and reviewed to evaluate the vascular anatomy.  CONTRAST: 178mL OMNIPAQUE  IOHEXOL 350 MG/ML SOLN  COMPARISON: CT of abdomen and pelvis 08/20/2012.  FINDINGS:  CT CHEST FINDINGS  Mediastinum: Heart size is normal. Mild calcifications of the mitral  annulus. Severe calcifications and thickening of the aortic valve.  Small amount of pericardial fluid and/or thickening, unlikely to be  of hemodynamic significance at this time. There is atherosclerosis  of the thoracic aorta, the great vessels of the mediastinum and the  coronary arteries, including calcified atherosclerotic plaque in the  left main, left anterior descending, left circumflex and right  coronary arteries. No pathologically enlarged mediastinal or hilar  lymph nodes. Esophagus is unremarkable in appearance.  Lungs/Pleura: Small right and moderate left pleural effusions with  associated passive atelectasis in the lower lobes of the lungs  bilaterally. No acute consolidative airspace disease. Mild diffuse  bronchial wall thickening. No definite suspicious appearing  pulmonary nodules or masses are identified at this time.  Musculoskeletal: Sclerotic lesion with healing pathologic fracture  in the lateral aspect of the left seventh rib. Multiple well-defined  lucent lesions associated with the endplates throughout the thoracic  spine, favored to represent Schmorl's nodes.  CT ABDOMEN AND PELVIS FINDINGS  Abdomen/Pelvis: The appearance of the liver, gallbladder, pancreas,  spleen, bilateral adrenal glands and bilateral kidneys is  unremarkable. No significant volume of ascites. No pneumoperitoneum.  No pathologic distention of small bowel. No definite lymphadenopathy  identified within the abdomen or pelvis. Prostate calcifications.  Foley balloon catheter in place with tip in the lumen of the urinary  bladder. Urinary bladder wall appears markedly thickened, however,  the urinary bladder is completely collapsed.  Musculoskeletal: Numerous aggressive appearing sclerotic lesions are  noted throughout  the lumbar spine, bony pelvis and right proximal  femur. Largest lesions include a sclerotic mass in the right ischium  which appears to have an associated pathologic fracture, measuring  at least 5.5 cm. There is also a pathologic fracture of the right  superior and inferior pubic rami. Sclerotic lesion in the superior  aspect of the right ilium with pathologic fracture.  VASCULAR MEASUREMENTS PERTINENT TO TAVR:  AORTA:  Minimal Aortic Diameter - 11 x 13 mm in the infrarenal abdominal  aorta.  Severity of Aortic Calcification - Severe (circumferential).  RIGHT PELVIS:  Right Common Iliac Artery -  Minimal Diameter - 7.6 x 5.6 mm  Tortuosity - Minimal  Calcification - Severe  Right External Iliac Artery -  Minimal Diameter - 7.0 x 8.3 mm  Tortuosity - Mild  Calcification - Minimal  Right Common Femoral Artery -  Minimal Diameter - 8.1 x 6.3 mm  Tortuosity - Mild  Calcification - Moderate  LEFT PELVIS:  Left Common Iliac Artery -  Minimal Diameter - 5.7 x 4.1 mm at site of short segment  (nonpropagating) dissection.  Tortuosity - Minimal  Calcification - Severe  Left External Iliac Artery -  Minimal Diameter - 6.6 x 7.7 mm  Tortuosity - Mild  Calcification - Minimal  Left Common Femoral Artery -  Minimal Diameter - 6.7  x 9.1 mm  Tortuosity - Mild  Calcification - Moderate  Review of the MIP images confirms the above findings.  IMPRESSION:  1. Vascular findings and measurements pertinent to potential TAVR  procedure, as above. This patient does have potentially suitable  pelvic arterial access, likely more preferable on the right given  the short segment dissection of the left common iliac artery  (minimal diameter of 5.7 x 4.1 mm at this level).  2. Multiple aggressive appearing bony lesions compatible with  metastatic prostate cancer to the bone, most notably in the right  hemipelvis where there are numerous pathologic fractures, as  detailed above.  3. Small right  and moderate left-sided pleural effusions.  4. Additional incidental findings, as above.  Electronically Signed:  By: Vinnie Langton M.D.  On: 01/07/2014 14:50        Provider Default, MD Tue Jan 07, 2014 7:50:43 PM EST       ADDENDUM REPORT: 01/07/2014 19:48  CLINICAL DATA: Severe aortic stenosis  EXAM:  Cardiac TAVR CT  TECHNIQUE:  The patient was scanned on a Philips 256 scanner. A 120 kV  retrospective scan was triggered in the descending thoracic aorta at  111 HU's. Gantry rotation speed was 270 msecs and collimation was .9  mm. No beta blockade or nitro were given. The 3D data set was  reconstructed in 5% intervals of the R-R cycle. Systolic and  diastolic phases were analyzed on a dedicated work station using  MPR, MIP and VRT modes. The patient received 80 cc of contrast.  FINDINGS:  Aortic Valve: Thickened and calcified aortic valve, predominantly of  the left and non-coronary leaflet. Moderately to severely limited  leaflet opening. No calcifications in the LVOT bellow the annulus.  Aorta: Normal size, Ascending aorta is free of calcifications, there  are mild calcifications around the origin of brachiocephalic  arteries non-obstructing flow.  Sinotubular Junction: 26 x 25 mm  Ascending Thoracic Aorta: 32 x 32 mm  Aortic Arch: 25 x 24 mm  Descending Thoracic Aorta: 24 x 24 mm  Sinus of Valsalva Measurements:  Non-coronary: 30 mm  Right -coronary: 30 mm  Left -coronary: 32 mm  Coronary Artery Height above Annulus:  Left Main: 13 mm  Right Coronary: 15 mm  Virtual Basal Annulus Measurements:  Maximum/Minimum Diameter: 26 x 23 mm  Perimeter: 86 mm  Area: 494 mm2  Optimum Fluoroscopic Angle for Delivery: LAO 6 CAU 6  IMPRESSION:  1. Severely thickened and calcified aortic stenosis with limited  leaflet excursions and measurements suitable for 26 mm  Edwards-SAPIEN valve.  2. Optimal deployment angle LAO 6 CAU 6.  3. Sufficient distance from annulus to both  coronary arteries for 26  mm valve.  Ena Dawley  Electronically Signed  By: Ena Dawley  On: 01/07/2014 19:48      Impression:  He has severe symptomatic aortic stenosis with moderate to severe LV function with an EF of 30-35% and recent acute exacerbation of chronic diastolic heart failure. He was admitted with class IV CHF symptoms with severe generalized weakness, several months of anorexia with protein-calorie malnutrition, and failure to thrive. He has metastatic prostate cancer with multiple mets to his pelvic bones and possibly to a left rib. He did improve with diuresis and despite having the above severe problems he had been very active until last April when he had his stroke, which he completely recovered from. He would like to get back to as active of a lifestyle as possible. His STS  estimated risk of mortality for conventional open surgical AVR is only 2.6% but I think he has multiple other significant problems that are not taken into account with that risk model that would markedly increase his risk of morbidity and mortality. I think his risk of mortality is likely greater than 15% and at best he would have a very slow, prolonged recovery and he may not be strong enough to make a complete recovery. I think TAVR is a reasonable option for treating his severe AS and would give him a much better chance of making a good recovery.  I discussed the options for treatment with him including conventional aortic valve replacement, transcatheter aortic valve replacement, and palliative medical therapy. The risks associated with conventional surgical aortic valve replacement and transcatheter aortic valve replacement were discussed in detail, as were expectations for post-operative convalescence. Long-term prognosis with medical therapy was discussed. This discussion was placed in the context of the patient's own specific clinical presentation and past medical history. All of his questions  been addressed. He would like to proceed with TAVR if possible.    Plan:  I will discuss his case further with Dr. Roxy Manns and Dr. Burt Knack and plan to proceed with TAVR next week.    Gaye Pollack, MD 01/07/2014

## 2014-01-09 ENCOUNTER — Other Ambulatory Visit: Payer: Medicare Other

## 2014-01-09 ENCOUNTER — Ambulatory Visit: Payer: Medicare Other | Admitting: Oncology

## 2014-01-09 LAB — BASIC METABOLIC PANEL
BUN: 20 mg/dL (ref 6–23)
CHLORIDE: 102 meq/L (ref 96–112)
CO2: 21 mEq/L (ref 19–32)
CREATININE: 1.23 mg/dL (ref 0.50–1.35)
Calcium: 9.3 mg/dL (ref 8.4–10.5)
GFR calc Af Amer: 63 mL/min — ABNORMAL LOW (ref >90)
GFR calc non Af Amer: 54 mL/min — ABNORMAL LOW (ref >90)
Glucose, Bld: 104 mg/dL — ABNORMAL HIGH (ref 70–99)
POTASSIUM: 4.7 meq/L (ref 3.7–5.3)
SODIUM: 138 meq/L (ref 137–147)

## 2014-01-09 LAB — CBC
HEMATOCRIT: 35.3 % — AB (ref 39.0–52.0)
Hemoglobin: 12 g/dL — ABNORMAL LOW (ref 13.0–17.0)
MCH: 31.9 pg (ref 26.0–34.0)
MCHC: 34 g/dL (ref 30.0–36.0)
MCV: 93.9 fL (ref 78.0–100.0)
Platelets: 240 10*3/uL (ref 150–400)
RBC: 3.76 MIL/uL — AB (ref 4.22–5.81)
RDW: 13.7 % (ref 11.5–15.5)
WBC: 9.2 10*3/uL (ref 4.0–10.5)

## 2014-01-09 LAB — HEPARIN LEVEL (UNFRACTIONATED): Heparin Unfractionated: 0.4 IU/mL (ref 0.30–0.70)

## 2014-01-09 NOTE — Progress Notes (Signed)
Pharmacy Note-Anticoagulation  Pharmacy Consult :  80 y.o. male is currently on Heparin infusion for atrial fibrillation .   Latest Labs : Hematology :  Recent Labs  01/07/14 0730 01/07/14 0920 01/07/14 1930 01/08/14 0340 01/09/14 0339  HGB 12.8*  --   --  12.5* 12.0*  HCT 37.4*  --   --  36.3* 35.3*  PLT 262  --   --  247 240  HEPARINUNFRC  --  0.25* 0.41 0.32 0.40  CREATININE 1.11  --   --  1.26 1.23    Current Medication[s] Include: Scheduled:  Scheduled:  . aspirin  81 mg Oral Daily  . feeding supplement (RESOURCE BREEZE)  1 Container Oral BID BM  . furosemide  40 mg Oral BID  . metoprolol succinate  25 mg Oral Daily  . multivitamin with minerals  1 tablet Oral Daily  . pantoprazole  40 mg Oral Daily  . potassium chloride  20 mEq Oral BID  . protein supplement  1 scoop Oral BID WC  . simvastatin  20 mg Oral q1800   Infusion[s]: Infusions:  . sodium chloride 10 mL/hr at 01/08/14 0700  . heparin 1,300 Units/hr (01/09/14 1044)    Assessment :  Patient awaiting TAVR, to be scheduled for Tuesday, 01/14/14.  Heparin level is within therapeutic range,  0.4 units/ml.  No bleeding complications observed.  Goal :  Heparin goal is Heparin level 0.3-0.7 units/ml.  Plan : 1. Heparin will be continued at the same rate, 1300 units/hr.   The next Heparin Level with AM labs. 2. Daily Heparin level, CBC while on Heparin.  Monitor for bleeding complications.   Olliver Boyadjian, Craig Guess, Pharm.D. 01/09/2014  1:38 PM

## 2014-01-09 NOTE — Progress Notes (Signed)
     SUBJECTIVE: Feels well this am. No chest pain or SOB.   BP 100/54  Pulse 75  Temp(Src) 97.5 F (36.4 C) (Oral)  Resp 18  Ht 6\' 2"  (1.88 m)  Wt 179 lb 3.7 oz (81.3 kg)  BMI 23.00 kg/m2  SpO2 96%  Intake/Output Summary (Last 24 hours) at 01/09/14 0813 Last data filed at 01/09/14 0600  Gross per 24 hour  Intake    660 ml  Output   1975 ml  Net  -1315 ml    PHYSICAL EXAM General: Well developed, well nourished, in no acute distress. Alert and oriented x 3.  Psych:  Good affect, responds appropriately Neck: No JVD. No masses noted.  Lungs: Clear bilaterally with no wheezes or rhonci noted.  Heart: RRR with harsh systolic murmur noted. Abdomen: Bowel sounds are present. Soft, non-tender.  Extremities: No lower extremity edema.   LABS: Basic Metabolic Panel:  Recent Labs  01/08/14 0340 01/09/14 0339  NA 140 138  K 4.3 4.7  CL 101 102  CO2 23 21  GLUCOSE 106* 104*  BUN 20 20  CREATININE 1.26 1.23  CALCIUM 9.1 9.3   CBC:  Recent Labs  01/08/14 0340 01/09/14 0339  WBC 8.8 9.2  HGB 12.5* 12.0*  HCT 36.3* 35.3*  MCV 93.8 93.9  PLT 247 240   Current Meds: . aspirin  81 mg Oral Daily  . feeding supplement (RESOURCE BREEZE)  1 Container Oral BID BM  . furosemide  40 mg Oral BID  . metoprolol succinate  25 mg Oral Daily  . multivitamin with minerals  1 tablet Oral Daily  . pantoprazole  40 mg Oral Daily  . potassium chloride  20 mEq Oral BID  . protein supplement  1 scoop Oral BID WC  . simvastatin  20 mg Oral q1800   ASSESSMENT AND PLAN:  1. Severe aortic stenosis: Likely contributing to his presentation with CHF. Much better symptomatically after diuresis. He has been evaluated by the TAVR team including Dr. Burt Knack, Dr. Roxy Manns and Dr. Cyndia Bent. Surgical risk is high. Plans are being made for TAVR on Tuesday February 3rd from right femoral approach.    2. Acute systolic heart failure with severe left ventricular dysfunction: Volume status is ok today. Net  negative 1.3 liters last 24 hours. Continue Lasix 40 mg po BID.   3. Paroxysmal atrial fibrillation: He is maintaining sinus rhythm. Continue low dose beta blocker.   4. Metastatic prostate CA: Per oncology notes, expected at least one year survival given current state of his metastatic disease to the bone   5. Moderate protein-calorie malnutrition    MCALHANY,CHRISTOPHER  1/29/20158:13 AM

## 2014-01-09 NOTE — Progress Notes (Signed)
CARDIAC REHAB PHASE I   PRE:  Rate/Rhythm: 83 SR  BP:  Supine:   Sitting: 100/44  Standing:    SaO2: 97 RA  MODE:  Ambulation: 390 ft   POST:  Rate/Rhythm: 90  BP:  Supine:   Sitting: 120/60  Standing:    SaO2: 98A 1135-1225 Assisted X 1 and used walker to ambulate. Pt's legs weak and he has difficulty getting to standing position. He leans forward with walking. VS stable. Pt to recliner after walk then to bathroom. Assisted pt back to recliner after bathroom. Wife present and call light in reach.  Rodney Langton RN 01/09/2014 12:21 PM

## 2014-01-09 NOTE — Progress Notes (Signed)
Physical Therapy Treatment Patient Details Name: Lucas Green MRN: [id number] DOB: 06-27-1932 Today's Date: 01/09/2014 Time: 5427-0623 PT Time Calculation (min): 32 min  PT Assessment / Plan / Recommendation  History of Present Illness 80 yo male admitted with weakness, pleural effusion, FTT. Hx of HTN, CVA, arthritis, prostate cancer with mets, GERD.  Work up for TAVR.     PT Comments   Pt admitted with above. Pt currently with functional limitations due to balance and endurance deficits.  Pt will benefit from skilled PT to increase their independence and safety with mobility to allow discharge to the venue listed below.   Follow Up Recommendations  CIR;Supervision/Assistance - 24 hour                 Equipment Recommendations  None recommended by PT    Recommendations for Other Services Rehab consult;OT consult  Frequency Min 3X/week   Progress towards PT Goals Progress towards PT goals: Progressing toward goals  Plan Current plan remains appropriate    Precautions / Restrictions Precautions Precautions: Fall Restrictions Weight Bearing Restrictions: No   Pertinent Vitals/Pain VSS,no pain    Mobility  Bed Mobility Overal bed mobility: Needs Assistance Sit to supine: Supervision General bed mobility comments: used rails Transfers Overall transfer level: Needs assistance Equipment used: Rolling walker (2 wheeled) Transfers: Sit to/from Stand Sit to Stand: Min guard General transfer comment: Assist to rise and stabilize.  Needeed assist to control descent onto bed after long walk.  Verbal cues for safety and technique as well as hand placement.   Ambulation/Gait Ambulation/Gait assistance: Min guard Ambulation Distance (Feet): 200 Feet Assistive device: Rolling walker (2 wheeled) Gait Pattern/deviations: Decreased stride length;Step-through pattern;Trunk flexed Gait velocity interpretation: <1.8 ft/sec, indicative of risk for recurrent falls General Gait  Details: Assist to stabilize intermittently. Fatigues fairly easily.    Exercises General Exercises - Lower Extremity Ankle Circles/Pumps: AROM;Both;10 reps;Seated Quad Sets: AROM;Both;10 reps;Supine Long Arc Quad: AROM;Both;10 reps;Seated Heel Slides: AROM;Both;10 reps;Supine Hip ABduction/ADduction: AROM;Both;10 reps;Supine Straight Leg Raises: AROM;Both;10 reps;Supine Toe Raises: AROM;10 reps;Both;Seated Other Exercises Other Exercises: Gave pt handout with exercises on it.     PT Goals (current goals can now be found in the care plan section)    Visit Information  Last PT Received On: 01/09/14 Assistance Needed: +1 History of Present Illness: 80 yo male admitted with weakness, pleural effusion, FTT. Hx of HTN, CVA, arthritis, prostate cancer with mets, GERD.  Work up for TAVR.      Subjective Data  Subjective: "I feel so fatigued.":   Cognition  Cognition Arousal/Alertness: Awake/alert Behavior During Therapy: WFL for tasks assessed/performed Overall Cognitive Status: Within Functional Limits for tasks assessed    Balance  Balance Overall balance assessment: Needs assistance Sitting-balance support: Bilateral upper extremity supported;Feet supported Sitting balance-Leahy Scale: Good Standing balance support: Bilateral upper extremity supported;During functional activity Standing balance-Leahy Scale: Fair Standing balance comment: Needs min guard assist for stability with RW.   High level balance activites: Turns;Direction changes;Sudden stops High Level Balance Comments: needs min guard assist with above.  End of Session PT - End of Session Equipment Utilized During Treatment: Gait belt Activity Tolerance: Patient limited by fatigue Patient left: in bed;with call bell/phone within reach Nurse Communication: Mobility status       INGOLD,Artrell Lawless 01/09/2014, 4:00 PM [facility] Acute Rehabilitation [phone] (pager)

## 2014-01-10 ENCOUNTER — Telehealth: Payer: Self-pay | Admitting: Oncology

## 2014-01-10 ENCOUNTER — Inpatient Hospital Stay (HOSPITAL_COMMUNITY): Payer: Medicare Other

## 2014-01-10 LAB — URINALYSIS, ROUTINE W REFLEX MICROSCOPIC
BILIRUBIN URINE: NEGATIVE
Glucose, UA: NEGATIVE mg/dL
Ketones, ur: NEGATIVE mg/dL
NITRITE: NEGATIVE
PH: 7 (ref 5.0–8.0)
Protein, ur: NEGATIVE mg/dL
SPECIFIC GRAVITY, URINE: 1.015 (ref 1.005–1.030)
UROBILINOGEN UA: 0.2 mg/dL (ref 0.0–1.0)

## 2014-01-10 LAB — CBC
HCT: 37.3 % — ABNORMAL LOW (ref 39.0–52.0)
HEMOGLOBIN: 12.8 g/dL — AB (ref 13.0–17.0)
MCH: 32 pg (ref 26.0–34.0)
MCHC: 34.3 g/dL (ref 30.0–36.0)
MCV: 93.3 fL (ref 78.0–100.0)
Platelets: 242 10*3/uL (ref 150–400)
RBC: 4 MIL/uL — AB (ref 4.22–5.81)
RDW: 13.6 % (ref 11.5–15.5)
WBC: 10.8 10*3/uL — ABNORMAL HIGH (ref 4.0–10.5)

## 2014-01-10 LAB — BASIC METABOLIC PANEL
BUN: 25 mg/dL — AB (ref 6–23)
CO2: 21 meq/L (ref 19–32)
Calcium: 9.3 mg/dL (ref 8.4–10.5)
Chloride: 99 mEq/L (ref 96–112)
Creatinine, Ser: 1.34 mg/dL (ref 0.50–1.35)
GFR calc Af Amer: 56 mL/min — ABNORMAL LOW (ref >90)
GFR calc non Af Amer: 49 mL/min — ABNORMAL LOW (ref >90)
GLUCOSE: 106 mg/dL — AB (ref 70–99)
POTASSIUM: 4.6 meq/L (ref 3.7–5.3)
SODIUM: 138 meq/L (ref 137–147)

## 2014-01-10 LAB — URINE MICROSCOPIC-ADD ON

## 2014-01-10 LAB — HEPARIN LEVEL (UNFRACTIONATED)
Heparin Unfractionated: 0.28 IU/mL — ABNORMAL LOW (ref 0.30–0.70)
Heparin Unfractionated: 0.39 IU/mL (ref 0.30–0.70)

## 2014-01-10 MED ORDER — FUROSEMIDE 40 MG PO TABS
40.0000 mg | ORAL_TABLET | Freq: Every day | ORAL | Status: DC
  Administered 2014-01-11 – 2014-01-13 (×3): 40 mg via ORAL
  Filled 2014-01-10 (×4): qty 1

## 2014-01-10 NOTE — Progress Notes (Signed)
Pharmacy Note-Anticoagulation  Pharmacy Consult :  80 y.o. male is currently on Heparin infusion for atrial fibrillation, ACS/STEMI.   Latest Labs : Hematology :  Recent Labs  01/08/14 0340 01/09/14 0339 01/10/14 0351 01/10/14 1214  HGB 12.5* 12.0* 12.8*  --   HCT 36.3* 35.3* 37.3*  --   PLT 247 240 242  --   HEPARINUNFRC 0.32 0.40 0.28* 0.39  CREATININE 1.26 1.23 1.34  --     Current Medication[s] Include:  Infusion[s]:  Marland Kitchen HEPARIN 1,400 Units/hr (01/10/14 0857)    Assessment :  Heparin level is within the therapeutic range,  0.39 units/ml  No bleeding complications observed.  Goal :  Heparin goal is Heparin level 0.3-0.7 units/ml.  Plan : 1. Heparin will be continued at 1400 units/hr. 2. Daily Heparin level, CBC while on Heparin.  Monitor for bleeding complications.   Mikia Delaluz, Craig Guess, Pharm.D. 01/10/2014  2:11 PM

## 2014-01-10 NOTE — Progress Notes (Addendum)
     SUBJECTIVE: No chest pain or SOB. He developed a cough yesterday, clear sputum production.   BP 95/56  Pulse 77  Temp(Src) 98.8 F (37.1 C) (Oral)  Resp 17  Ht 6\' 2"  (1.88 m)  Wt 178 lb 9.2 oz (81 kg)  BMI 22.92 kg/m2  SpO2 95%  Intake/Output Summary (Last 24 hours) at 01/10/14 0741 Last data filed at 01/09/14 2116  Gross per 24 hour  Intake    600 ml  Output   2801 ml  Net  -2201 ml    PHYSICAL EXAM General: Well developed, well nourished, in no acute distress. Alert and oriented x 3.  Psych: Good affect, responds appropriately  Neck: No JVD. No masses noted.  Lungs: Clear bilaterally with no wheezes or rhonci noted.  Heart: RRR with harsh systolic murmur noted.  Abdomen: Bowel sounds are present. Soft, non-tender.  Extremities: No lower extremity edema.   LABS: Basic Metabolic Panel:  Recent Labs  01/09/14 0339 01/10/14 0351  NA 138 138  K 4.7 4.6  CL 102 99  CO2 21 21  GLUCOSE 104* 106*  BUN 20 25*  CREATININE 1.23 1.34  CALCIUM 9.3 9.3   CBC:  Recent Labs  01/09/14 0339 01/10/14 0351  WBC 9.2 10.8*  HGB 12.0* 12.8*  HCT 35.3* 37.3*  MCV 93.9 93.3  PLT 240 242   Current Meds: . aspirin  81 mg Oral Daily  . feeding supplement (RESOURCE BREEZE)  1 Container Oral BID BM  . furosemide  40 mg Oral BID  . metoprolol succinate  25 mg Oral Daily  . multivitamin with minerals  1 tablet Oral Daily  . pantoprazole  40 mg Oral Daily  . potassium chloride  20 mEq Oral BID  . protein supplement  1 scoop Oral BID WC  . simvastatin  20 mg Oral q1800   ASSESSMENT AND PLAN: 80 y.o. male w/ PMHx significant for prostate CA with bone mets who has been hospitalized for congestive heart failure since 1/19. The patient is a retired [employer] operational officer. He describes several months of progressive dyspnea, leg swelling, nausea, generalized weakness, and anorexia. He was noted to have clinical signs of CHF as well as radiographic evidence with a left pleural effusion  and pulmonary edema on CXR. An echo showed severe global LV dysfunction and severe aortic stenosis.    1. Severe aortic stenosis: Admitted with CHF, failure to thrive. Severe AS is likely contributing to his presentation with CHF. Much better symptomatically after diuresis. He has been evaluated by the TAVR team. Surgical risk with open sternotomy is high. Plans are being made for TAVR on Tuesday February 3rd from right femoral approach.   2. Acute systolic heart failure with severe left ventricular dysfunction: Volume status is ok today. Net negative 2.2 liters last 24 hours. He is down 30 lbs since admission. Will lower Lasix to 40 mg po Qdaily.    3. Paroxysmal atrial fibrillation: He is maintaining sinus rhythm. Continue low dose beta blocker.   4. Metastatic prostate CA: Per oncology notes, expected at least one year survival given current state of his metastatic disease to the bone   5. Moderate protein-calorie malnutrition   6. Cough: WBC slightly elevated. PA/lateral chest x-ray today.    MCALHANY,CHRISTOPHER  1/30/20157:41 AM

## 2014-01-10 NOTE — Progress Notes (Signed)
ANTICOAGULATION CONSULT NOTE - Follow Up Consult  Pharmacy Consult for heparin Indication: atrial fibrillation  Labs:  Recent Labs  01/08/14 0340 01/09/14 0339 01/10/14 0351  HGB 12.5* 12.0* 12.8*  HCT 36.3* 35.3* 37.3*  PLT 247 240 242  HEPARINUNFRC 0.32 0.40 0.28*  CREATININE 1.26 1.23 1.34    Assessment: 80yo male now subtherapeutic on heparin, no issues w/ gtt.  Goal of Therapy:  Heparin level 0.3-0.7 units/ml   Plan:  Will increase heparin gtt slightly to 1400 units/hr and check level in [place], PharmD, BCPS  01/10/2014,5:05 AM

## 2014-01-10 NOTE — Telephone Encounter (Signed)
relative lmonvm to cx 1/29 appt due to pt in hosp. message to desk nurse

## 2014-01-10 NOTE — Care Management Note (Signed)
    Page 1 of 2   01/21/2014     11:38:56 AM   CARE MANAGEMENT NOTE 01/21/2014  Patient:  Lucas Green, Lucas Green   Account Number:  [id number]  Date Initiated:  01/01/2014  Documentation initiated by:  Gabriel Earing  Subjective/Objective Assessment:   PT ADMITTED WITH CCO SOB     Action/Plan:   FROM HOME   Anticipated DC Date:  01/20/2014   Anticipated DC Plan:  [place]  CM consult      Choice offered to / List presented to:             Status of service:  In process, will continue to follow Medicare Important Message given?  NA - LOS <3 / Initial given by admissions (If response is "NO", the following Medicare IM given date fields will be blank) Date Medicare IM given:   Date Additional Medicare IM given:    Discharge Disposition:    Per UR Regulation:  Reviewed for med. necessity/level of care/duration of stay  If discussed at [place] of Stay Meetings, dates discussed:    Comments:  Contact:  Lard,[name] Spouse [phone]  01/20/14 Corynne Scibilia,RN,BSN [phone] PT DISCHARGED TO SNF TODAY, PER CSW ARRANGEMENTS.  01-14-14 3pm Luz Lex, RNBSN [phone] Post op TAVR today.  01/10/14 [place] [phone] PT WITH SEVERE AORTIC STENOSIS, PROSTATE CA; SCHEDULED FOR TAVR ON 2/2.  PT/OT RECOMMENDING CIR PRIOR TO DC HOME. WILL FOLLOW UP POST OP.  PT HAS SUPPORTIVE SPOUSE.  01/05/14 MMCGIBBONEY, RN, BSN PLAN TO DC TO [place] FOR VATS.  01/01/14 MMCGIBBONEY, RN, BSN CHART REVIEWED.

## 2014-01-10 NOTE — Progress Notes (Signed)
[name]   Clinically stable Tentatively for TAVR on Tuesday February 3rd  [name],Lucas Green

## 2014-01-10 NOTE — Progress Notes (Signed)
Patient complained to SN regarding the foley causing some discomfort. Foley care was completed by NT this morning. Cleaned the foley of discharge sticking to the area closest to the penis and pulled the foreskin back and cleaned with warm water. Paged Jadene Pierini, [place] and he ordered a UA to be sent. Sent UA to lab. Will continue to monitor closely. [place] Nurse, RN

## 2014-01-10 NOTE — Progress Notes (Signed)
Physical Therapy Treatment Patient Details Name: Lucas Green MRN: [id number] DOB: December 22, 1931 Today's Date: 01/10/2014 Time: 7510-2585 PT Time Calculation (min): 40 min  PT Assessment / Plan / Recommendation  History of Present Illness 80 yo male admitted with weakness, pleural effusion, FTT. Hx of HTN, CVA, arthritis, prostate cancer with mets, GERD.  Work up for TAVR.     PT Comments   Pt able to improve to supervision level with dynamic gait this session, continues with decreased cadence and requires RW for balance and energy conservation.  Initiated Berg balance test, pt unable to complete full test due to fatigue.  Pt continues to require frequent rests due to decreased endurance and activity tolerance.  Follow Up Recommendations  CIR     Does the patient have the potential to tolerate intense rehabilitation     Barriers to Discharge        Equipment Recommendations  None recommended by PT    Recommendations for Other Services    Frequency Min 3X/week   Progress towards PT Goals Progress towards PT goals: Goals met and updated - see care plan  Plan Current plan remains appropriate    Precautions / Restrictions Precautions Precautions: Fall Restrictions Weight Bearing Restrictions: No   Pertinent Vitals/Pain No c/o pain    Mobility  Bed Mobility Overal bed mobility: Needs Assistance Bed Mobility: Supine to Sit;Sit to Supine Supine to sit: Supervision;HOB elevated General bed mobility comments: increased time Transfers Equipment used: Rolling walker (2 wheeled) Sit to Stand: Min guard General transfer comment: needs assist for final portion of sit to stand Ambulation/Gait Ambulation/Gait assistance: Supervision Ambulation Distance (Feet): 200 Feet Assistive device: Rolling walker (2 wheeled) Gait velocity interpretation: <1.8 ft/sec, indicative of risk for recurrent falls General Gait Details: pt performed gait at supervision without LOB during head turns,  start/stop with RW.  Pt requires 2 x standing rest breaks during 200' gait    Exercises       PT Goals (current goals can now be found in the care plan section)    Visit Information  Last PT Received On: 01/10/14 Assistance Needed: +1 History of Present Illness: 80 yo male admitted with weakness, pleural effusion, FTT. Hx of HTN, CVA, arthritis, prostate cancer with mets, GERD.  Work up for TAVR.      Subjective Data      Cognition  Cognition Arousal/Alertness: Awake/alert Behavior During Therapy: WFL for tasks assessed/performed Overall Cognitive Status: Within Functional Limits for tasks assessed    Balance  Standardized Balance Assessment Standardized Balance Assessment : Berg Balance Test Berg Balance Test Sit to Stand: Able to stand using hands after several tries Standing Unsupported: Able to stand 2 minutes with supervision Sitting with Back Unsupported but Feet Supported on Floor or Stool: Able to sit safely and securely 2 minutes Stand to Sit: Uses backs of legs against chair to control descent Transfers: Needs one person to assist Standing Unsupported with Eyes Closed: Able to stand 10 seconds with supervision Standing Ubsupported with Feet Together: Needs help to attain position but able to stand for 30 seconds with feet together From Standing, Reach Forward with Outstretched Arm: Reaches forward but needs supervision From Standing Position, Pick up Object from Floor: Unable to pick up and needs supervision From Standing Position, Turn to Look Behind Over each Shoulder: Turn sideways only but maintains balance  End of Session PT - End of Session Activity Tolerance: Patient limited by fatigue Patient left: in chair;with call bell/phone within reach Nurse Communication:  Mobility status   GP     DONAWERTH,KAREN 01/10/2014, 10:11 AM

## 2014-01-10 NOTE — Progress Notes (Addendum)
NUTRITION FOLLOW UP  Intervention:    Continue Resource Breeze po TID, each supplement provides 250 kcal and 9 grams of protein RD to follow for nutrition care plan  Nutrition Dx:   Inadequate oral intake, resolved  New Nutrition Dx: Increased nutrient needs related to catabolic illness as evidenced by estimated nutrition needs, ongoing  Goal:   Pt to meet >/= 90% of their estimated nutrition needs, met  Monitor:   PO & supplemental intake, weight, labs, I/O's  Assessment:   Patient with PMH of prostate cancer, HTN, dyslipidemia who presented to [facility] ED with complaints of progressive weakness, fatigue, failure to thrive over past couple of days prior to this admission; CXR revealed moderate sized left pleural effusion.   Patient transferred to [facility] for further cardiac workup, possible Transcatheter Aortic Valve Replacement.  Patient s/p procedure 1/26: CARDIAC CATHETERIZATION  Patient reports his appetite is good.  Drinking his ordered [employer] supplement upon RD visit.  PO intake 75-100% per flowsheet records.  N/V resolved.  Noted tentatively for TAVR Tuesday, 2/3.  Per Oncology notes, patient's expected survival is at least one year given current state of his metastatic disease to the bone.  Height: Ht Readings from Last 1 Encounters:  12/30/13 _0  (1.88 m)    Weight Status:   Wt Readings from Last 1 Encounters:  01/10/14 178 lb 9.2 oz (81 kg)    Body mass index is 22.92 kg/(m^2).  Re-estimated needs:  Kcal: 2100-2300 Protein: 110-120  gm Fluid: 2.1-2.3 L  Skin: Intact  Diet Order: Cardiac   Intake/Output Summary (Last 24 hours) at 01/10/14 1208 Last data filed at 01/10/14 0816  Gross per 24 hour  Intake    960 ml  Output   2351 ml  Net  -1391 ml    Labs:   Recent Labs Lab 01/08/14 0340 01/09/14 0339 01/10/14 0351  NA 140 138 138  K 4.3 4.7 4.6  CL 101 102 99  CO2 _1 BUN 20 20 25*  CREATININE 1.26 1.23 1.34  CALCIUM 9.1 9.3 9.3   GLUCOSE 106* 104* 106*    Scheduled Meds: . aspirin  81 mg Oral Daily  . feeding supplement (RESOURCE BREEZE)  1 Container Oral BID BM  . [START ON 01/11/2014] furosemide  40 mg Oral Daily  . metoprolol succinate  25 mg Oral Daily  . multivitamin with minerals  1 tablet Oral Daily  . pantoprazole  40 mg Oral Daily  . potassium chloride  20 mEq Oral BID  . protein supplement  1 scoop Oral BID WC  . simvastatin  20 mg Oral q1800    Continuous Infusions: . sodium chloride 10 mL/hr at 01/08/14 0700  . heparin 1,400 Units/hr (01/10/14 0857)    Arthur Holms, RD, LDN Pager #: [phone] After-Hours Pager #: [phone]

## 2014-01-11 LAB — CBC
HEMATOCRIT: 36.5 % — AB (ref 39.0–52.0)
HEMOGLOBIN: 12.5 g/dL — AB (ref 13.0–17.0)
MCH: 32 pg (ref 26.0–34.0)
MCHC: 34.2 g/dL (ref 30.0–36.0)
MCV: 93.4 fL (ref 78.0–100.0)
Platelets: 253 10*3/uL (ref 150–400)
RBC: 3.91 MIL/uL — ABNORMAL LOW (ref 4.22–5.81)
RDW: 13.7 % (ref 11.5–15.5)
WBC: 10.1 10*3/uL (ref 4.0–10.5)

## 2014-01-11 LAB — BASIC METABOLIC PANEL
BUN: 23 mg/dL (ref 6–23)
CHLORIDE: 100 meq/L (ref 96–112)
CO2: 20 mEq/L (ref 19–32)
Calcium: 9.3 mg/dL (ref 8.4–10.5)
Creatinine, Ser: 1.21 mg/dL (ref 0.50–1.35)
GFR calc Af Amer: 64 mL/min — ABNORMAL LOW (ref >90)
GFR calc non Af Amer: 55 mL/min — ABNORMAL LOW (ref >90)
Glucose, Bld: 104 mg/dL — ABNORMAL HIGH (ref 70–99)
Potassium: 4.6 mEq/L (ref 3.7–5.3)
SODIUM: 137 meq/L (ref 137–147)

## 2014-01-11 LAB — HEPARIN LEVEL (UNFRACTIONATED): Heparin Unfractionated: 0.42 IU/mL (ref 0.30–0.70)

## 2014-01-11 MED ORDER — GUAIFENESIN ER 600 MG PO TB12
600.0000 mg | ORAL_TABLET | Freq: Four times a day (QID) | ORAL | Status: DC | PRN
  Administered 2014-01-11 – 2014-01-12 (×2): 600 mg via ORAL
  Filled 2014-01-11 (×3): qty 1

## 2014-01-11 NOTE — Progress Notes (Addendum)
CARDIAC REHAB PHASE I   PRE:  Rate/Rhythm: 83 SR  BP:  Supine: 90/54  Sitting:   Standing:    SaO2: 97 RA  MODE:  Ambulation: 500 ft   POST:  Rate/Rhythm: 86  BP:  Supine:   Sitting: 120/60  Standing:    SaO2: 98 RA 1055-1130 Assisted X 1 and used walker to ambulate. Gait steady with walker. Pt has difficulty  getting to standing position, legs weak. Once he was up he was able to walk 500 feet without c/o. Pt to recliner after walk with call light in reach and wife present.  Rodney Langton RN 01/11/2014 11:27 AM

## 2014-01-11 NOTE — Progress Notes (Signed)
Patient Name: Lucas Green Date of Encounter: 01/11/2014  Principal Problem:   Severe aortic stenosis Active Problems:   Hypertension   Prostate cancer   Metastasis from malignant tumor of prostate   Bone metastases   Pleural effusion, left   Hypokalemia   CAP (community acquired pneumonia)   Nausea and vomiting   Dyslipidemia   Weakness   Failure to thrive   Moderate protein-calorie malnutrition   Aortic valve disorders   Atrial fibrillation   Chronic diastolic congestive heart failure   Acute on chronic diastolic heart failure   Acute on chronic systolic and diastolic heart failure, NYHA class 4   Length of Stay: 12  SUBJECTIVE  He has developed a cough productive of scanty thick mucus. No fever or pleurisy, no dyspnea. Cough is no worse when recumbent.  CURRENT MEDS . aspirin  81 mg Oral Daily  . feeding supplement (RESOURCE BREEZE)  1 Container Oral BID BM  . furosemide  40 mg Oral Daily  . metoprolol succinate  25 mg Oral Daily  . multivitamin with minerals  1 tablet Oral Daily  . pantoprazole  40 mg Oral Daily  . potassium chloride  20 mEq Oral BID  . protein supplement  1 scoop Oral BID WC  . simvastatin  20 mg Oral q1800    OBJECTIVE  Filed Vitals:   01/10/14 1057 01/10/14 1326 01/10/14 2026 01/11/14 0500  BP: 97/60 97/61 101/64 94/59  Pulse: 89 89 85 74  Temp:  98.6 F (37 C) 98.9 F (37.2 C) 98.3 F (36.8 C)  TempSrc:  Oral Oral Oral  Resp:  18 17 18   Height:      Weight:    81 kg (178 lb 9.2 oz)  SpO2:  99% 96% 94%    Intake/Output Summary (Last 24 hours) at 01/11/14 1147 Last data filed at 01/11/14 0800  Gross per 24 hour  Intake 2014.8 ml  Output   1650 ml  Net  364.8 ml   Filed Weights   01/09/14 0438 01/10/14 0500 01/11/14 0500  Weight: 81.3 kg (179 lb 3.7 oz) 81 kg (178 lb 9.2 oz) 81 kg (178 lb 9.2 oz)    PHYSICAL EXAM  General: Alert, oriented x3, no distress Head: no evidence of trauma, PERRL, EOMI, no exophtalmos or  lid lag, no myxedema, no xanthelasma; normal ears, nose and oropharynx Neck: normal jugular venous pulsations and no hepatojugular reflux; brisk carotid pulses without delay and no carotid bruits Chest: clear to auscultation, no signs of consolidation by percussion or palpation, normal fremitus, symmetrical and full respiratory excursions. A few rhonchi and wheezes in the bases cleared immediately after cough Cardiovascular: normal position and quality of the apical impulse, regular rhythm, normal first and second heart sounds, norubs or gallops, mid to late peaking rather quiet systolic murmur Abdomen: no tenderness or distention, no masses by palpation, no abnormal pulsatility or arterial bruits, normal bowel sounds, no hepatosplenomegaly Extremities: no clubbing, cyanosis or edema; 2+ radial, ulnar and brachial pulses bilaterally; 2+ right femoral, posterior tibial and dorsalis pedis pulses; 2+ left femoral, posterior tibial and dorsalis pedis pulses; no subclavian or femoral bruits Neurological: grossly nonfocal   Accessory Clinical Findings  CBC  Recent Labs  01/10/14 0351 01/11/14 0430  WBC 10.8* 10.1  HGB 12.8* 12.5*  HCT 37.3* 36.5*  MCV 93.3 93.4  PLT 242 440   Basic Metabolic Panel  Recent Labs  01/10/14 0351 01/11/14 0430  NA 138 137  K 4.6 4.6  CL 99 100  CO2 21 20  GLUCOSE 106* 104*  BUN 25* 23  CREATININE 1.34 1.21  CALCIUM 9.3 9.3   TELE No arrhythmia  ECG NSR, PRWP  ASSESSMENT AND PLAN He may have a new viral URI. Symptoms do not suggest flu or pneumonia. WBC is actually lower today. Monitor. Hopefully it will not interfere with the scheduled procedure. TAVR planned for Tuesday. Appears euvolemic. Renal function is good.   Sanda Klein, MD, [facility] [phone] office [phone] pager 01/11/2014 11:47 AM

## 2014-01-11 NOTE — Progress Notes (Signed)
ANTICOAGULATION CONSULT NOTE - Follow Up Consult  Pharmacy Consult for Heparin Indication: afib  Allergies  Allergen Reactions  . Other     Beer and Wine- swelling/headache/nausea    Patient Measurements: Height: 6\' 2"  (188 cm) Weight: 178 lb 9.2 oz (81 kg) IBW/kg (Calculated) : 82.2  Vital Signs: Temp: 98.3 F (36.8 C) (01/31 0500) Temp src: Oral (01/31 0500) BP: 102/62 mmHg (01/31 1149) Pulse Rate: 74 (01/31 0500)  Labs:  Recent Labs  01/09/14 0339 01/10/14 0351 01/10/14 1214 01/11/14 0430  HGB 12.0* 12.8*  --  12.5*  HCT 35.3* 37.3*  --  36.5*  PLT 240 242  --  253  HEPARINUNFRC 0.40 0.28* 0.39 0.42  CREATININE 1.23 1.34  --  1.21    Estimated Creatinine Clearance: 56.7 ml/min (by C-G formula based on Cr of 1.21).   Assessment: 80 y.o. male continues on heparin for afib. Heparin level therapeutic. CBC remains stable. No bleeding noted. Plans for TAVR on Tuesday.  Goal of Therapy:  Heparin level 0.3-0.7 units/ml Monitor platelets by anticoagulation protocol: Yes   Plan:  1. Continue heparin at 1400 units/hr 2. F/u daily heparin level and CBC  Sherlon Handing, PharmD, BCPS Clinical pharmacist, pager [phone] 01/11/2014,1:32 PM

## 2014-01-12 LAB — CBC
HCT: 36.3 % — ABNORMAL LOW (ref 39.0–52.0)
Hemoglobin: 12.6 g/dL — ABNORMAL LOW (ref 13.0–17.0)
MCH: 32.6 pg (ref 26.0–34.0)
MCHC: 34.7 g/dL (ref 30.0–36.0)
MCV: 93.8 fL (ref 78.0–100.0)
PLATELETS: 255 10*3/uL (ref 150–400)
RBC: 3.87 MIL/uL — AB (ref 4.22–5.81)
RDW: 13.6 % (ref 11.5–15.5)
WBC: 9.2 10*3/uL (ref 4.0–10.5)

## 2014-01-12 LAB — BASIC METABOLIC PANEL
BUN: 25 mg/dL — ABNORMAL HIGH (ref 6–23)
CALCIUM: 9.4 mg/dL (ref 8.4–10.5)
CO2: 21 mEq/L (ref 19–32)
CREATININE: 1.26 mg/dL (ref 0.50–1.35)
Chloride: 100 mEq/L (ref 96–112)
GFR, EST AFRICAN AMERICAN: 61 mL/min — AB (ref >90)
GFR, EST NON AFRICAN AMERICAN: 52 mL/min — AB (ref >90)
Glucose, Bld: 104 mg/dL — ABNORMAL HIGH (ref 70–99)
Potassium: 4.6 mEq/L (ref 3.7–5.3)
SODIUM: 136 meq/L — AB (ref 137–147)

## 2014-01-12 LAB — HEPARIN LEVEL (UNFRACTIONATED): Heparin Unfractionated: 0.39 IU/mL (ref 0.30–0.70)

## 2014-01-12 NOTE — Progress Notes (Signed)
ANTICOAGULATION CONSULT NOTE - Follow Up Consult  Pharmacy Consult for Heparin Indication: afib  Allergies  Allergen Reactions  . Other     Beer and Wine- swelling/headache/nausea    Patient Measurements: Height: 6\' 2"  (188 cm) Weight: 172 lb 2.9 oz (78.1 kg) (b) IBW/kg (Calculated) : 82.2  Vital Signs: Temp: 97.2 F (36.2 C) (02/01 1419) Temp src: Oral (02/01 1419) BP: 99/55 mmHg (02/01 1419) Pulse Rate: 81 (02/01 1419)  Labs:  Recent Labs  01/10/14 0351 01/10/14 1214 01/11/14 0430 01/12/14 0458  HGB 12.8*  --  12.5* 12.6*  HCT 37.3*  --  36.5* 36.3*  PLT 242  --  253 255  HEPARINUNFRC 0.28* 0.39 0.42 0.39  CREATININE 1.34  --  1.21 1.26    Estimated Creatinine Clearance: 52.5 ml/min (by C-G formula based on Cr of 1.26).   Assessment: 80 y.o. male continues on heparin for afib. Heparin level therapeutic. CBC remains stable. No bleeding noted. Plans for TAVR on Tuesday.  Goal of Therapy:  Heparin level 0.3-0.7 units/ml Monitor platelets by anticoagulation protocol: Yes   Plan:  1. Continue heparin at 1400 units/hr 2. F/u daily heparin level and CBC  Sherlon Handing, PharmD, BCPS Clinical pharmacist, pager [phone] 01/12/2014,2:24 PM

## 2014-01-12 NOTE — Progress Notes (Signed)
Patient Name: Lucas Green Date of Encounter: 01/12/2014  Principal Problem:   Severe aortic stenosis Active Problems:   Hypertension   Prostate cancer   Metastasis from malignant tumor of prostate   Bone metastases   Pleural effusion, left   Hypokalemia   CAP (community acquired pneumonia)   Nausea and vomiting   Dyslipidemia   Weakness   Failure to thrive   Moderate protein-calorie malnutrition   Aortic valve disorders   Atrial fibrillation   Chronic diastolic congestive heart failure   Acute on chronic diastolic heart failure   Acute on chronic systolic and diastolic heart failure, NYHA class 4   Length of Stay: 13  SUBJECTIVE  No dyspnea at rest Cough productive of scanty clear mucus - no worse.  Afebrile. WBC lower, frankly normal now In/out mostly even (approx 1L +/v3 in 48h) Renal function good   CURRENT MEDS . aspirin  81 mg Oral Daily  . feeding supplement (RESOURCE BREEZE)  1 Container Oral BID BM  . furosemide  40 mg Oral Daily  . metoprolol succinate  25 mg Oral Daily  . multivitamin with minerals  1 tablet Oral Daily  . pantoprazole  40 mg Oral Daily  . potassium chloride  20 mEq Oral BID  . protein supplement  1 scoop Oral BID WC  . simvastatin  20 mg Oral q1800    OBJECTIVE  Filed Vitals:   01/11/14 1149 01/11/14 1505 01/11/14 2034 01/12/14 0507  BP: 102/62 104/50 90/50 95/53   Pulse:  86 81 75  Temp:  97.1 F (36.2 C) 97.6 F (36.4 C) 97.6 F (36.4 C)  TempSrc:  Oral Oral Oral  Resp:   18 18  Height:      Weight:    78.1 kg (172 lb 2.9 oz)  SpO2:  95% 95% 95%    Intake/Output Summary (Last 24 hours) at 01/12/14 1135 Last data filed at 01/12/14 1100  Gross per 24 hour  Intake 1490.67 ml  Output   2400 ml  Net -909.33 ml   Filed Weights   01/10/14 0500 01/11/14 0500 01/12/14 0507  Weight: 81 kg (178 lb 9.2 oz) 81 kg (178 lb 9.2 oz) 78.1 kg (172 lb 2.9 oz)    PHYSICAL EXAM  General: Pleasant, NAD. Neuro: Alert and  oriented X 3. Moves all extremities spontaneously. Psych: Normal affect. HEENT:  Normal  Neck: Supple without bruits or JVD. Lungs:  Resp regular and unlabored, CTA. Heart: RRR no s3, s4, or murmurs. Abdomen: Soft, non-tender, non-distended, BS + x 4.  Extremities: No clubbing, cyanosis or edema. DP/PT/Radials 2+ and equal bilaterally.  Accessory Clinical Findings  CBC  Recent Labs  01/11/14 0430 01/12/14 0458  WBC 10.1 9.2  HGB 12.5* 12.6*  HCT 36.5* 36.3*  MCV 93.4 93.8  PLT 253 119   Basic Metabolic Panel  Recent Labs  01/11/14 0430 01/12/14 0458  NA 137 136*  K 4.6 4.6  CL 100 100  CO2 20 21  GLUCOSE 104* 104*  BUN 23 25*  CREATININE 1.21 1.26  CALCIUM 9.3 9.4    TELE NSR  ASSESSMENT AND PLAN  He asked questions about long term reliability of TAVR and statistical results of the procedure. I quoted literature as well as I could, but deferred the details about our own institutional records to Dr. Burt Knack. He appears determined to go ahead with the procedure, but appropriately apprehensive. No change in meds.   Sanda Klein, MD, [facility] [phone] office [phone]  pager 01/12/2014 11:35 AM

## 2014-01-13 ENCOUNTER — Inpatient Hospital Stay (HOSPITAL_COMMUNITY): Payer: Medicare Other

## 2014-01-13 DIAGNOSIS — I359 Nonrheumatic aortic valve disorder, unspecified: Secondary | ICD-10-CM

## 2014-01-13 LAB — CBC
HCT: 36.6 % — ABNORMAL LOW (ref 39.0–52.0)
Hemoglobin: 12.6 g/dL — ABNORMAL LOW (ref 13.0–17.0)
MCH: 32.2 pg (ref 26.0–34.0)
MCHC: 34.4 g/dL (ref 30.0–36.0)
MCV: 93.6 fL (ref 78.0–100.0)
Platelets: 288 10*3/uL (ref 150–400)
RBC: 3.91 MIL/uL — ABNORMAL LOW (ref 4.22–5.81)
RDW: 13.5 % (ref 11.5–15.5)
WBC: 9.1 10*3/uL (ref 4.0–10.5)

## 2014-01-13 LAB — BASIC METABOLIC PANEL
BUN: 23 mg/dL (ref 6–23)
CHLORIDE: 96 meq/L (ref 96–112)
CO2: 19 mEq/L (ref 19–32)
Calcium: 9.4 mg/dL (ref 8.4–10.5)
Creatinine, Ser: 1.14 mg/dL (ref 0.50–1.35)
GFR, EST AFRICAN AMERICAN: 69 mL/min — AB (ref >90)
GFR, EST NON AFRICAN AMERICAN: 59 mL/min — AB (ref >90)
Glucose, Bld: 107 mg/dL — ABNORMAL HIGH (ref 70–99)
POTASSIUM: 4.5 meq/L (ref 3.7–5.3)
Sodium: 133 mEq/L — ABNORMAL LOW (ref 137–147)

## 2014-01-13 LAB — HEPARIN LEVEL (UNFRACTIONATED): HEPARIN UNFRACTIONATED: 0.38 [IU]/mL (ref 0.30–0.70)

## 2014-01-13 LAB — SURGICAL PCR SCREEN
MRSA, PCR: NEGATIVE
Staphylococcus aureus: NEGATIVE

## 2014-01-13 MED ORDER — VANCOMYCIN HCL 10 G IV SOLR
1250.0000 mg | INTRAVENOUS | Status: AC
  Administered 2014-01-14: 1250 mg via INTRAVENOUS
  Filled 2014-01-13: qty 1250

## 2014-01-13 MED ORDER — ETOMIDATE 2 MG/ML IV SOLN
INTRAVENOUS | Status: AC
  Filled 2014-01-13: qty 10

## 2014-01-13 MED ORDER — POTASSIUM CHLORIDE CRYS ER 20 MEQ PO TBCR
20.0000 meq | EXTENDED_RELEASE_TABLET | Freq: Every day | ORAL | Status: DC
  Administered 2014-01-13: 20 meq via ORAL
  Filled 2014-01-13 (×2): qty 1

## 2014-01-13 MED ORDER — MAGNESIUM SULFATE 50 % IJ SOLN
40.0000 meq | INTRAMUSCULAR | Status: DC
  Filled 2014-01-13: qty 10

## 2014-01-13 MED ORDER — PHENYLEPHRINE HCL 10 MG/ML IJ SOLN
30.0000 ug/min | INTRAVENOUS | Status: DC
  Filled 2014-01-13: qty 2

## 2014-01-13 MED ORDER — VANCOMYCIN HCL 1000 MG IV SOLR
INTRAVENOUS | Status: DC
  Filled 2014-01-13 (×2): qty 1000

## 2014-01-13 MED ORDER — HEPARIN SODIUM (PORCINE) 1000 UNIT/ML IJ SOLN
INTRAMUSCULAR | Status: AC
  Filled 2014-01-13: qty 1

## 2014-01-13 MED ORDER — CHLORHEXIDINE GLUCONATE 4 % EX LIQD
60.0000 mL | Freq: Once | CUTANEOUS | Status: AC
  Administered 2014-01-14: 4 via TOPICAL
  Filled 2014-01-13 (×2): qty 60

## 2014-01-13 MED ORDER — NITROGLYCERIN IN D5W 200-5 MCG/ML-% IV SOLN
2.0000 ug/min | INTRAVENOUS | Status: DC
  Filled 2014-01-13: qty 250

## 2014-01-13 MED ORDER — SODIUM CHLORIDE 0.9 % IV SOLN
INTRAVENOUS | Status: DC
  Filled 2014-01-13: qty 30

## 2014-01-13 MED ORDER — DOPAMINE-DEXTROSE 3.2-5 MG/ML-% IV SOLN
2.0000 ug/kg/min | INTRAVENOUS | Status: DC
  Filled 2014-01-13: qty 250

## 2014-01-13 MED ORDER — CHLORHEXIDINE GLUCONATE 4 % EX LIQD
60.0000 mL | Freq: Once | CUTANEOUS | Status: AC
  Administered 2014-01-13: 4 via TOPICAL
  Filled 2014-01-13: qty 60

## 2014-01-13 MED ORDER — DEXMEDETOMIDINE HCL IN NACL 400 MCG/100ML IV SOLN
0.1000 ug/kg/h | INTRAVENOUS | Status: AC
  Administered 2014-01-14: 0.2 ug/kg/h via INTRAVENOUS
  Filled 2014-01-13: qty 100

## 2014-01-13 MED ORDER — SODIUM CHLORIDE 0.9 % IV SOLN
INTRAVENOUS | Status: DC
  Filled 2014-01-13: qty 1

## 2014-01-13 MED ORDER — DEXTROSE 5 % IV SOLN
1.5000 g | INTRAVENOUS | Status: AC
  Administered 2014-01-14: 1.5 g via INTRAVENOUS
  Filled 2014-01-13: qty 1.5

## 2014-01-13 MED ORDER — EPINEPHRINE HCL 1 MG/ML IJ SOLN
0.5000 ug/min | INTRAMUSCULAR | Status: DC
  Filled 2014-01-13: qty 4

## 2014-01-13 MED ORDER — POTASSIUM CHLORIDE 2 MEQ/ML IV SOLN
80.0000 meq | INTRAVENOUS | Status: DC
  Filled 2014-01-13: qty 40

## 2014-01-13 MED ORDER — NOREPINEPHRINE BITARTRATE 1 MG/ML IJ SOLN
0.0000 ug/min | INTRAVENOUS | Status: AC
  Administered 2014-01-14: 4 ug/min via INTRAVENOUS
  Filled 2014-01-13: qty 8

## 2014-01-13 NOTE — Progress Notes (Signed)
Subjective:  No complaints this am.   Objective:  Vital Signs in the last 24 hours: Temp:  [97.2 F (36.2 C)-98.2 F (36.8 C)] 98 F (36.7 C) (02/02 0619) Pulse Rate:  [80-86] 80 (02/02 0619) Resp:  [18] 18 (02/02 0619) BP: (99-103)/(53-57) 103/53 mmHg (02/02 0619) SpO2:  [95 %-98 %] 98 % (02/02 0619) Weight:  [172 lb 2.9 oz (78.1 kg)] 172 lb 2.9 oz (78.1 kg) (02/02 4034)  Intake/Output from previous day:  Intake/Output Summary (Last 24 hours) at 01/13/14 7425 Last data filed at 01/13/14 9563  Gross per 24 hour  Intake   1888 ml  Output   2425 ml  Net   -537 ml    Physical Exam: General appearance: alert, cooperative and no distress Lungs: clear, decreased Lt base Heart: regular rate and rhythm and 2/6 systolic murmur AOV   Rate: 80  Rhythm: normal sinus rhythm  Lab Results:  Recent Labs  01/12/14 0458 01/13/14 0548  WBC 9.2 9.1  HGB 12.6* 12.6*  PLT 255 288    Recent Labs  01/12/14 0458 01/13/14 0548  NA 136* 133*  K 4.6 4.5  CL 100 96  CO2 21 19  GLUCOSE 104* 107*  BUN 25* 23  CREATININE 1.26 1.14   No results found for this basename: TROPONINI, CK, MB,  in the last 72 hours No results found for this basename: INR,  in the last 72 hours  Imaging: Imaging results have been reviewed  Cardiac Studies:  Assessment/Plan:   Principal Problem:   Severe aortic stenosis Active Problems:   PAF (paroxysmal atrial fibrillation)   Hypertension   H/O: CVA (cerebrovascular accident) April 2014   Prostate cancer with bone mets   Metastasis from malignant tumor of prostate   Dyslipidemia   Weakness   Moderate protein-calorie malnutrition    PLAN: TAVR in am. Coumadin after surgery for PAF. Decrease K+ to once a day. CT measurements Suggest 26 mm valve suitable  Jenkins Rouge

## 2014-01-13 NOTE — Progress Notes (Signed)
ANTICOAGULATION CONSULT NOTE - Follow Up Consult  Pharmacy Consult for heparin Indication: atrial fibrillation  Allergies  Allergen Reactions  . Other     Beer and Wine- swelling/headache/nausea    Patient Measurements: Height: 6\' 2"  (188 cm) Weight: 172 lb 2.9 oz (78.1 kg) IBW/kg (Calculated) : 82.2 Heparin Dosing Weight: 78 kg  Vital Signs: Temp: 98 F (36.7 C) (02/02 0619) Temp src: Oral (02/02 0619) BP: 103/53 mmHg (02/02 0619) Pulse Rate: 80 (02/02 0619)  Labs:  Recent Labs  01/11/14 0430 01/12/14 0458 01/13/14 0548  HGB 12.5* 12.6* 12.6*  HCT 36.5* 36.3* 36.6*  PLT 253 255 288  HEPARINUNFRC 0.42 0.39 0.38  CREATININE 1.21 1.26 1.14    Estimated Creatinine Clearance: 58 ml/min (by C-G formula based on Cr of 1.14).  Assessment: Patient is a 80 y.o M on heparin for Afib with plan for TAVR on 2/3 and heparin drip to be stopped at midnight tonight. CBC is stable and Heparin level is at goal with 0.38 this morning.  No bleeding documented.  Goal of Therapy:  Heparin level 0.3-0.7 units/ml Monitor platelets by anticoagulation protocol: Yes   Plan:  1)Cont Heparin 1400 units/hr 2) f/u after OR on 2/3      Shaivi Rothschild P 01/13/2014,8:58 AM

## 2014-01-13 NOTE — Progress Notes (Signed)
[name]    The patient and his family were counseled at length regarding treatment alternatives for management of severe symptomatic aortic stenosis. Alternative approaches such as conventional aortic valve replacement, transcatheter aortic valve replacement, and palliative medical therapy were compared and contrasted at length.  The risks associated with conventional surgical aortic valve replacement were been discussed in detail, as were expectations for post-operative convalescence. Long-term prognosis with medical therapy was discussed. This discussion was placed in the context of the patient's own specific clinical presentation and past medical history.   A discussion has been held regarding what types of management strategies would be attempted intraoperatively in the event of life-threatening complications, including whether or not the patient would be considered a candidate for the use of cardiopulmonary bypass and/or conversion to open sternotomy for attempted surgical intervention.  The patient has been advised of a variety of complications that might develop including but not limited to risks of death, stroke, paravalvular leak, aortic dissection or other major vascular complications, aortic annulus rupture, device embolization, cardiac rupture or perforation, mitral regurgitation, acute myocardial infarction, arrhythmia, heart block or bradycardia requiring permanent pacemaker placement, congestive heart failure, respiratory failure, renal failure, pneumonia, infection, other late complications related to structural valve deterioration or migration, or other complications that might ultimately cause a temporary or permanent loss of functional independence or other long term morbidity.  The patient provides full informed consent for the procedure as described and all questions were answered.  I spent in excess of 30 minutes of direct face-to-face encounter this evening with the patient  and his family.   Lucas [name] 01/13/2014 6:19 PM

## 2014-01-13 NOTE — Progress Notes (Signed)
[address]       [place] [address]             [phone]     CARDIOTHORACIC SURGERY PROGRESS NOTE  7 Days Post-Op  S/P Procedure(s) (LRB): LEFT AND RIGHT HEART CATHETERIZATION WITH CORONARY ANGIOGRAM (N/A)  Subjective: Feels well.  Slight cough.  No complaints.  Objective: Vital signs in last 24 hours: Temp:  [97.2 F (36.2 C)-98.2 F (36.8 C)] 98 F (36.7 C) (02/02 0619) Pulse Rate:  [80-86] 80 (02/02 0619) Cardiac Rhythm:  [-] Normal sinus rhythm (02/02 0829) Resp:  [18] 18 (02/02 0619) BP: (99-103)/(53-57) 103/53 mmHg (02/02 0619) SpO2:  [95 %-98 %] 98 % (02/02 0619) Weight:  [78.1 kg (172 lb 2.9 oz)] 78.1 kg (172 lb 2.9 oz) (02/02 4097)  Physical Exam:  Rhythm:   sinus  Breath sounds: clear  Heart sounds:  RRR w/ systolic murmur  Incisions:  n/a  Abdomen:  Soft, non-distended, non-tender  Extremities:  Warm, well-perfused    Intake/Output from previous day: 02/01 0701 - 02/02 0700 In: 2176 [P.O.:1600; I.V.:576] Out: 2425 [Urine:2425] Intake/Output this shift:    Lab Results:  Recent Labs  01/12/14 0458 01/13/14 0548  WBC 9.2 9.1  HGB 12.6* 12.6*  HCT 36.3* 36.6*  PLT 255 288   BMET:  Recent Labs  01/12/14 0458 01/13/14 0548  NA 136* 133*  K 4.6 4.5  CL 100 96  CO2 21 19  GLUCOSE 104* 107*  BUN 25* 23  CREATININE 1.26 1.14  CALCIUM 9.4 9.4    CBG (last 3)  No results found for this basename: GLUCAP,  in the last 72 hours PT/INR:  No results found for this basename: LABPROT, INR,  in the last 72 hours  CXR:  N/A  Assessment/Plan: S/P Procedure(s) (LRB): LEFT AND RIGHT HEART CATHETERIZATION WITH CORONARY ANGIOGRAM (N/A)  Overall clinically stable.  Looks ready for TAVR tomorrow.  Will check repeat CXR and d/c foley.  During the course of the patient's preoperative work up they have been evaluated comprehensively by a multidisciplinary team of specialists coordinated through the [facility] in the [facility].  They have been demonstrated to suffer from symptomatic severe aortic stenosis as noted above. The patient has been counseled extensively as to the relative risks and benefits of all options for the treatment of severe aortic stenosis including long term medical therapy, conventional surgery for aortic valve replacement, and transcatheter aortic valve replacement.  The patient has been independently evaluated by two cardiac surgeons including myself and Dr. Cyndia Bent, and both of [place] feel that the patient would be a poor candidate for conventional surgery (predicted risk of mortality >15% and/or predicted risk of permanent morbidity >50%) because of comorbidities including metastatic prostate cancer with severe malnutrition and frailty.    Based upon review of all of the patient's preoperative diagnostic tests they are felt to be candidate for transcatheter aortic valve replacement using the transapical approach as an alternative to high risk conventional surgery.  The patient's CTA of the abdomen/pelvis reveals disease in both iliac systems which could prevent safe passage of 18 French delivery sheath via transfemoral approach.  Following the decision to proceed with transcatheter aortic valve replacement, a discussion has been held regarding what types of management strategies would be attempted intraoperatively in the event of life-threatening complications, including whether or not the patient would be considered a candidate for the use of cardiopulmonary  bypass and/or conversion to open sternotomy for attempted surgical intervention.  The patient has been advised of a variety of complications that might develop including but not limited to risks of death, stroke, paravalvular leak, aortic dissection or other major vascular complications, aortic annulus rupture, device embolization, cardiac rupture or perforation, mitral regurgitation, acute myocardial  infarction, arrhythmia, heart block or bradycardia requiring permanent pacemaker placement, congestive heart failure, respiratory failure, renal failure, pneumonia, infection, other late complications related to structural valve deterioration or migration, or other complications that might ultimately cause a temporary or permanent loss of functional independence or other long term morbidity.  The patient provides full informed consent for the procedure as described and all questions were answered.  I also discussed CODE STATUS at length with Dr. Rolanda Jay.  He specifically requests to be considered as a FULL CODE    [name],Lucas [name] 01/13/2014 10:11 AM

## 2014-01-13 NOTE — Progress Notes (Signed)
Physical Therapy Treatment Patient Details Name: Lucas [name] MRN: [id number] DOB: Aug 23, 1932 Today's Date: 01/13/2014 Time: 6314-9702 PT Time Calculation (min): 24 min  PT Assessment / Plan / Recommendation  History of Present Illness 80 yo male admitted with weakness, pleural effusion, FTT. Hx of HTN, CVA, arthritis, prostate cancer with mets, GERD.  Work up for TAVR.     PT Comments   Pt making good progress with PT.  Scheduled for valve replacement tomorrow.  Follow Up Recommendations  CIR     Does the patient have the potential to tolerate intense rehabilitation     Barriers to Discharge        Equipment Recommendations  None recommended by PT    Recommendations for Other Services    Frequency Min 3X/week   Progress towards PT Goals Progress towards PT goals: Progressing toward goals  Plan Current plan remains appropriate    Precautions / Restrictions Precautions Precautions: Fall Restrictions Weight Bearing Restrictions: No   Pertinent Vitals/Pain No c/o Pain, HR 113 with gait.    Mobility  Bed Mobility Supine to sit: Supervision;HOB elevated General bed mobility comments: increased time Transfers Overall transfer level: Needs assistance Equipment used: Rolling walker (2 wheeled) Sit to Stand: Min guard (verbal cueing for stand to sit.  Pt tends to "plop".) Ambulation/Gait Ambulation/Gait assistance: Min guard Ambulation Distance (Feet): 350 Feet Assistive device: Rolling walker (2 wheeled) Gait velocity: decreased General Gait Details:  (no LOB with head turning, start/stops. no rest breaks today)    Exercises     PT Diagnosis:    PT Problem List:   PT Treatment Interventions:     PT Goals (current goals can now be found in the care plan section)    Visit Information  Last PT Received On: 01/13/14 Assistance Needed: +1 History of Present Illness: 80 yo male admitted with weakness, pleural effusion, FTT. Hx of HTN, CVA, arthritis, prostate  cancer with mets, GERD.  Work up for TAVR.      Subjective Data  Subjective: "I am getting stronger."   Cognition  Cognition Arousal/Alertness: Awake/alert Behavior During Therapy: WFL for tasks assessed/performed Overall Cognitive Status: Within Functional Limits for tasks assessed    Balance  General Comments General comments (skin integrity, edema, etc.): Pt tends to move slowly during transitions.    End of Session PT - End of Session Equipment Utilized During Treatment: Gait belt Activity Tolerance: Patient tolerated treatment well Patient left: in chair;with call bell/phone within reach Nurse Communication: Mobility status   GP     Koula Venier LUBECK 01/13/2014, 11:18 AM

## 2014-01-13 NOTE — Progress Notes (Signed)
1440 Offered to walk with pt but he stated too tired from XRAY and walk earlier. Will see after surgery. Graylon Good RN BSN 01/13/2014 2:41 PM

## 2014-01-14 ENCOUNTER — Encounter (HOSPITAL_COMMUNITY)
Admission: EM | Disposition: A | Discharge status: Skilled Nursing Facility | Payer: Medicare Other | Source: Home / Self Care | Attending: Family Medicine

## 2014-01-14 ENCOUNTER — Inpatient Hospital Stay (HOSPITAL_COMMUNITY): Payer: Medicare Other

## 2014-01-14 ENCOUNTER — Encounter (HOSPITAL_COMMUNITY): Payer: Medicare Other | Admitting: Certified Registered"

## 2014-01-14 ENCOUNTER — Encounter (HOSPITAL_COMMUNITY): Payer: Self-pay | Admitting: Anesthesiology

## 2014-01-14 ENCOUNTER — Inpatient Hospital Stay (HOSPITAL_COMMUNITY): Payer: Medicare Other | Admitting: Certified Registered"

## 2014-01-14 DIAGNOSIS — I359 Nonrheumatic aortic valve disorder, unspecified: Secondary | ICD-10-CM

## 2014-01-14 DIAGNOSIS — Z006 Encounter for examination for normal comparison and control in clinical research program: Secondary | ICD-10-CM

## 2014-01-14 DIAGNOSIS — Z952 Presence of prosthetic heart valve: Secondary | ICD-10-CM

## 2014-01-14 HISTORY — PX: INTRAOPERATIVE TRANSESOPHAGEAL ECHOCARDIOGRAM: SHX5062

## 2014-01-14 HISTORY — DX: Presence of prosthetic heart valve: Z95.2

## 2014-01-14 HISTORY — PX: TRANSCATHETER AORTIC VALVE REPLACEMENT, TRANSAORTIC: SHX6402

## 2014-01-14 HISTORY — PX: CHEST TUBE INSERTION: SHX231

## 2014-01-14 LAB — POCT I-STAT 3, ART BLOOD GAS (G3+)
Acid-base deficit: 3 mmol/L — ABNORMAL HIGH (ref 0.0–2.0)
Acid-base deficit: 3 mmol/L — ABNORMAL HIGH (ref 0.0–2.0)
Acid-base deficit: 3 mmol/L — ABNORMAL HIGH (ref 0.0–2.0)
Acid-base deficit: 4 mmol/L — ABNORMAL HIGH (ref 0.0–2.0)
BICARBONATE: 20.4 meq/L (ref 20.0–24.0)
Bicarbonate: 21.1 mEq/L (ref 20.0–24.0)
Bicarbonate: 20.5 mEq/L (ref 20.0–24.0)
Bicarbonate: 21.1 mEq/L (ref 20.0–24.0)
O2 SAT: 100 %
O2 Saturation: 100 %
O2 Saturation: 99 %
O2 Saturation: 96 %
PCO2 ART: 33.5 mmHg — AB (ref 35.0–45.0)
PCO2 ART: 38.3 mmHg (ref 35.0–45.0)
PH ART: 7.485 — AB (ref 7.350–7.450)
PH ART: 7.448 (ref 7.350–7.450)
PH ART: 7.347 — AB (ref 7.350–7.450)
PO2 ART: 151 mmHg — AB (ref 80.0–100.0)
PO2 ART: 346 mmHg — AB (ref 80.0–100.0)
Patient temperature: 36.3
Patient temperature: 36.4
TCO2: 21 mmol/L (ref 0–100)
TCO2: 22 mmol/L (ref 0–100)
TCO2: 21 mmol/L (ref 0–100)
TCO2: 22 mmol/L (ref 0–100)
pCO2 arterial: 26.8 mmHg — ABNORMAL LOW (ref 35.0–45.0)
pCO2 arterial: 29.4 mmHg — ABNORMAL LOW (ref 35.0–45.0)
pH, Arterial: 7.407 (ref 7.350–7.450)
pO2, Arterial: 150 mmHg — ABNORMAL HIGH (ref 80.0–100.0)
pO2, Arterial: 84 mmHg (ref 80.0–100.0)

## 2014-01-14 LAB — BASIC METABOLIC PANEL
BUN: 24 mg/dL — AB (ref 6–23)
CHLORIDE: 99 meq/L (ref 96–112)
CO2: 19 meq/L (ref 19–32)
CREATININE: 1.15 mg/dL (ref 0.50–1.35)
Calcium: 9.5 mg/dL (ref 8.4–10.5)
GFR calc Af Amer: 68 mL/min — ABNORMAL LOW (ref >90)
GFR calc non Af Amer: 59 mL/min — ABNORMAL LOW (ref >90)
Glucose, Bld: 95 mg/dL (ref 70–99)
Potassium: 4.4 mEq/L (ref 3.7–5.3)
Sodium: 137 mEq/L (ref 137–147)

## 2014-01-14 LAB — POCT I-STAT 4, (NA,K, GLUC, HGB,HCT)
GLUCOSE: 159 mg/dL — AB (ref 70–99)
Glucose, Bld: 109 mg/dL — ABNORMAL HIGH (ref 70–99)
Glucose, Bld: 161 mg/dL — ABNORMAL HIGH (ref 70–99)
Glucose, Bld: 167 mg/dL — ABNORMAL HIGH (ref 70–99)
HCT: 29 % — ABNORMAL LOW (ref 39.0–52.0)
HEMATOCRIT: 34 % — AB (ref 39.0–52.0)
HEMATOCRIT: 31 % — AB (ref 39.0–52.0)
HEMATOCRIT: 36 % — AB (ref 39.0–52.0)
HEMOGLOBIN: 10.5 g/dL — AB (ref 13.0–17.0)
HEMOGLOBIN: 12.2 g/dL — AB (ref 13.0–17.0)
Hemoglobin: 11.6 g/dL — ABNORMAL LOW (ref 13.0–17.0)
Hemoglobin: 9.9 g/dL — ABNORMAL LOW (ref 13.0–17.0)
POTASSIUM: 3.9 meq/L (ref 3.7–5.3)
Potassium: 4.4 mEq/L (ref 3.7–5.3)
Potassium: 3.7 mEq/L (ref 3.7–5.3)
Potassium: 4 mEq/L (ref 3.7–5.3)
SODIUM: 135 meq/L — AB (ref 137–147)
Sodium: 136 mEq/L — ABNORMAL LOW (ref 137–147)
Sodium: 135 mEq/L — ABNORMAL LOW (ref 137–147)
Sodium: 135 mEq/L — ABNORMAL LOW (ref 137–147)

## 2014-01-14 LAB — CBC
HCT: 27.8 % — ABNORMAL LOW (ref 39.0–52.0)
HEMATOCRIT: 37.4 % — AB (ref 39.0–52.0)
HEMATOCRIT: 29.6 % — AB (ref 39.0–52.0)
Hemoglobin: 12.8 g/dL — ABNORMAL LOW (ref 13.0–17.0)
Hemoglobin: 10.4 g/dL — ABNORMAL LOW (ref 13.0–17.0)
Hemoglobin: 9.4 g/dL — ABNORMAL LOW (ref 13.0–17.0)
MCH: 32.2 pg (ref 26.0–34.0)
MCH: 32.4 pg (ref 26.0–34.0)
MCH: 31.3 pg (ref 26.0–34.0)
MCHC: 34.2 g/dL (ref 30.0–36.0)
MCHC: 35.1 g/dL (ref 30.0–36.0)
MCHC: 33.8 g/dL (ref 30.0–36.0)
MCV: 94 fL (ref 78.0–100.0)
MCV: 92.2 fL (ref 78.0–100.0)
MCV: 92.7 fL (ref 78.0–100.0)
PLATELETS: 228 10*3/uL (ref 150–400)
Platelets: 303 10*3/uL (ref 150–400)
Platelets: 235 10*3/uL (ref 150–400)
RBC: 3.98 MIL/uL — ABNORMAL LOW (ref 4.22–5.81)
RBC: 3.21 MIL/uL — ABNORMAL LOW (ref 4.22–5.81)
RBC: 3 MIL/uL — AB (ref 4.22–5.81)
RDW: 13.4 % (ref 11.5–15.5)
RDW: 13.2 % (ref 11.5–15.5)
RDW: 13.2 % (ref 11.5–15.5)
WBC: 8.3 10*3/uL (ref 4.0–10.5)
WBC: 9.1 10*3/uL (ref 4.0–10.5)
WBC: 9.7 10*3/uL (ref 4.0–10.5)

## 2014-01-14 LAB — GLUCOSE, CAPILLARY
GLUCOSE-CAPILLARY: 124 mg/dL — AB (ref 70–99)
GLUCOSE-CAPILLARY: 123 mg/dL — AB (ref 70–99)
GLUCOSE-CAPILLARY: 123 mg/dL — AB (ref 70–99)
GLUCOSE-CAPILLARY: 139 mg/dL — AB (ref 70–99)
GLUCOSE-CAPILLARY: 144 mg/dL — AB (ref 70–99)
Glucose-Capillary: 141 mg/dL — ABNORMAL HIGH (ref 70–99)
Glucose-Capillary: 130 mg/dL — ABNORMAL HIGH (ref 70–99)
Glucose-Capillary: 163 mg/dL — ABNORMAL HIGH (ref 70–99)

## 2014-01-14 LAB — POCT I-STAT, CHEM 8
BUN: 19 mg/dL (ref 6–23)
Calcium, Ion: 1.3 mmol/L (ref 1.13–1.30)
Chloride: 103 mEq/L (ref 96–112)
Creatinine, Ser: 1.1 mg/dL (ref 0.50–1.35)
Glucose, Bld: 149 mg/dL — ABNORMAL HIGH (ref 70–99)
HCT: 26 % — ABNORMAL LOW (ref 39.0–52.0)
Hemoglobin: 8.8 g/dL — ABNORMAL LOW (ref 13.0–17.0)
Potassium: 4 mEq/L (ref 3.7–5.3)
SODIUM: 135 meq/L — AB (ref 137–147)
TCO2: 20 mmol/L (ref 0–100)

## 2014-01-14 LAB — PROTIME-INR
INR: 1.32 (ref 0.00–1.49)
Prothrombin Time: 16.1 seconds — ABNORMAL HIGH (ref 11.6–15.2)

## 2014-01-14 LAB — PREPARE RBC (CROSSMATCH)

## 2014-01-14 LAB — ABO/RH: ABO/RH(D): A NEG

## 2014-01-14 LAB — CREATININE, SERUM
Creatinine, Ser: 1 mg/dL (ref 0.50–1.35)
GFR calc Af Amer: 80 mL/min — ABNORMAL LOW (ref >90)
GFR calc non Af Amer: 69 mL/min — ABNORMAL LOW (ref >90)

## 2014-01-14 LAB — MAGNESIUM: MAGNESIUM: 2.6 mg/dL — AB (ref 1.5–2.5)

## 2014-01-14 LAB — APTT: aPTT: 38 seconds — ABNORMAL HIGH (ref 24–37)

## 2014-01-14 SURGERY — ECHOCARDIOGRAM, TRANSESOPHAGEAL, INTRAOPERATIVE
Anesthesia: General | Site: Chest

## 2014-01-14 MED ORDER — FENTANYL CITRATE 0.05 MG/ML IJ SOLN
INTRAMUSCULAR | Status: AC
  Filled 2014-01-14: qty 5

## 2014-01-14 MED ORDER — ACETAMINOPHEN 650 MG RE SUPP: 650.0000 mg | Freq: Once | RECTAL | Status: AC

## 2014-01-14 MED ORDER — ROCURONIUM BROMIDE 100 MG/10ML IV SOLN
INTRAVENOUS | Status: DC | PRN
  Administered 2014-01-14: 20 mg via INTRAVENOUS
  Administered 2014-01-14: 50 mg via INTRAVENOUS
  Administered 2014-01-14: 30 mg via INTRAVENOUS

## 2014-01-14 MED ORDER — OXYCODONE HCL 5 MG PO TABS: 5.0000 mg | ORAL_TABLET | ORAL | Status: DC | PRN

## 2014-01-14 MED ORDER — ACETAMINOPHEN 160 MG/5ML PO SOLN
650.0000 mg | Freq: Once | ORAL | Status: AC
  Administered 2014-01-14: 650 mg
  Filled 2014-01-14: qty 20.3

## 2014-01-14 MED ORDER — PROPOFOL 10 MG/ML IV BOLUS
INTRAVENOUS | Status: AC
  Filled 2014-01-14: qty 20

## 2014-01-14 MED ORDER — SODIUM CHLORIDE 0.9 % IV SOLN
1.0000 mL/kg/h | INTRAVENOUS | Status: AC
  Administered 2014-01-14: 1 mL/kg/h via INTRAVENOUS

## 2014-01-14 MED ORDER — SODIUM CHLORIDE 0.9 % IJ SOLN
INTRAMUSCULAR | Status: AC
  Filled 2014-01-14: qty 10

## 2014-01-14 MED ORDER — FAMOTIDINE IN NACL 20-0.9 MG/50ML-% IV SOLN
20.0000 mg | Freq: Two times a day (BID) | INTRAVENOUS | Status: DC
  Administered 2014-01-14: 20 mg via INTRAVENOUS

## 2014-01-14 MED ORDER — PROTAMINE SULFATE 10 MG/ML IV SOLN
INTRAVENOUS | Status: AC
  Filled 2014-01-14: qty 25

## 2014-01-14 MED ORDER — METOPROLOL TARTRATE 25 MG/10 ML ORAL SUSPENSION
12.5000 mg | Freq: Two times a day (BID) | ORAL | Status: DC
Start: 2014-01-14 — End: 2014-01-15
  Filled 2014-01-14 (×3): qty 5

## 2014-01-14 MED ORDER — EPINEPHRINE HCL 0.1 MG/ML IJ SOSY
PREFILLED_SYRINGE | INTRAMUSCULAR | Status: AC
  Filled 2014-01-14: qty 10

## 2014-01-14 MED ORDER — MILRINONE IN DEXTROSE 20 MG/100ML IV SOLN
0.2500 ug/kg/min | INTRAVENOUS | Status: DC
  Administered 2014-01-14: 0.25 ug/kg/min via INTRAVENOUS
  Filled 2014-01-14: qty 100

## 2014-01-14 MED ORDER — NOREPINEPHRINE BITARTRATE 1 MG/ML IJ SOLN
0.0000 ug/min | INTRAVENOUS | Status: DC
  Filled 2014-01-14: qty 8

## 2014-01-14 MED ORDER — SODIUM CHLORIDE 0.9 % IV SOLN
INTRAVENOUS | Status: DC
  Administered 2014-01-14: 06:00:00 via INTRAVENOUS

## 2014-01-14 MED ORDER — FENTANYL CITRATE 0.05 MG/ML IJ SOLN
INTRAMUSCULAR | Status: DC | PRN
  Administered 2014-01-14: 50 ug via INTRAVENOUS
  Administered 2014-01-14 (×3): 150 ug via INTRAVENOUS
  Administered 2014-01-14 (×2): 250 ug via INTRAVENOUS

## 2014-01-14 MED ORDER — LACTATED RINGERS IV SOLN
INTRAVENOUS | Status: DC | PRN
  Administered 2014-01-14: 07:00:00 via INTRAVENOUS

## 2014-01-14 MED ORDER — MIDAZOLAM HCL 10 MG/2ML IJ SOLN
INTRAMUSCULAR | Status: AC
  Filled 2014-01-14: qty 2

## 2014-01-14 MED ORDER — PHENYLEPHRINE HCL 10 MG/ML IJ SOLN
INTRAMUSCULAR | Status: DC | PRN
  Administered 2014-01-14: 40 ug via INTRAVENOUS

## 2014-01-14 MED ORDER — MILRINONE IN DEXTROSE 20 MG/100ML IV SOLN
0.1250 ug/kg/min | INTRAVENOUS | Status: DC
  Filled 2014-01-14: qty 100

## 2014-01-14 MED ORDER — ONDANSETRON HCL 4 MG/2ML IJ SOLN
4.0000 mg | Freq: Four times a day (QID) | INTRAMUSCULAR | Status: DC | PRN
  Administered 2014-01-14 – 2014-01-18 (×3): 4 mg via INTRAVENOUS
  Filled 2014-01-14 (×3): qty 2

## 2014-01-14 MED ORDER — ALBUMIN HUMAN 5 % IV SOLN
250.0000 mL | INTRAVENOUS | Status: AC | PRN
  Administered 2014-01-14 (×2): 250 mL via INTRAVENOUS

## 2014-01-14 MED ORDER — 0.9 % SODIUM CHLORIDE (POUR BTL) OPTIME
TOPICAL | Status: DC | PRN
  Administered 2014-01-14: 6000 mL

## 2014-01-14 MED ORDER — DEXTROSE 5 % IV SOLN
1.5000 g | Freq: Two times a day (BID) | INTRAVENOUS | Status: AC
  Administered 2014-01-14 – 2014-01-16 (×4): 1.5 g via INTRAVENOUS
  Filled 2014-01-14 (×5): qty 1.5

## 2014-01-14 MED ORDER — ASPIRIN EC 325 MG PO TBEC
325.0000 mg | DELAYED_RELEASE_TABLET | Freq: Every day | ORAL | Status: DC
  Filled 2014-01-14: qty 1

## 2014-01-14 MED ORDER — LACTATED RINGERS IV SOLN
INTRAVENOUS | Status: DC
  Administered 2014-01-14: 17:00:00 via INTRAVENOUS

## 2014-01-14 MED ORDER — MORPHINE SULFATE 2 MG/ML IJ SOLN: 1.0000 mg | INTRAMUSCULAR | Status: AC | PRN

## 2014-01-14 MED ORDER — SODIUM CHLORIDE 0.9 % IV SOLN: 250.0000 mL | INTRAVENOUS | Status: DC | PRN

## 2014-01-14 MED ORDER — ACETAMINOPHEN 500 MG PO TABS
1000.0000 mg | ORAL_TABLET | Freq: Four times a day (QID) | ORAL | Status: AC
  Administered 2014-01-15 – 2014-01-19 (×18): 1000 mg via ORAL
  Filled 2014-01-14 (×20): qty 2

## 2014-01-14 MED ORDER — DEXMEDETOMIDINE HCL IN NACL 200 MCG/50ML IV SOLN
0.1000 ug/kg/h | INTRAVENOUS | Status: DC
  Filled 2014-01-14: qty 50

## 2014-01-14 MED ORDER — SUCCINYLCHOLINE CHLORIDE 20 MG/ML IJ SOLN
INTRAMUSCULAR | Status: DC | PRN
  Administered 2014-01-14: 40 mg via INTRAVENOUS

## 2014-01-14 MED ORDER — ASPIRIN 81 MG PO CHEW: 324.0000 mg | CHEWABLE_TABLET | Freq: Every day | ORAL | Status: DC

## 2014-01-14 MED ORDER — MIDAZOLAM HCL 2 MG/2ML IJ SOLN: 2.0000 mg | INTRAMUSCULAR | Status: DC | PRN

## 2014-01-14 MED ORDER — ETOMIDATE 2 MG/ML IV SOLN
INTRAVENOUS | Status: AC
  Filled 2014-01-14: qty 10

## 2014-01-14 MED ORDER — MIDAZOLAM HCL 5 MG/5ML IJ SOLN
INTRAMUSCULAR | Status: DC | PRN
  Administered 2014-01-14 (×2): 1 mg via INTRAVENOUS

## 2014-01-14 MED ORDER — SUCCINYLCHOLINE CHLORIDE 20 MG/ML IJ SOLN
INTRAMUSCULAR | Status: AC
  Filled 2014-01-14: qty 1

## 2014-01-14 MED ORDER — EPHEDRINE SULFATE 50 MG/ML IJ SOLN
INTRAMUSCULAR | Status: DC | PRN
  Administered 2014-01-14: 5 mg via INTRAVENOUS

## 2014-01-14 MED ORDER — ARTIFICIAL TEARS OP OINT
TOPICAL_OINTMENT | OPHTHALMIC | Status: DC | PRN
  Administered 2014-01-14: 1 via OPHTHALMIC

## 2014-01-14 MED ORDER — SODIUM CHLORIDE 0.9 % IJ SOLN: 3.0000 mL | INTRAMUSCULAR | Status: DC | PRN

## 2014-01-14 MED ORDER — ALBUMIN HUMAN 5 % IV SOLN
INTRAVENOUS | Status: DC | PRN
  Administered 2014-01-14 (×2): via INTRAVENOUS

## 2014-01-14 MED ORDER — PHENYLEPHRINE HCL 10 MG/ML IJ SOLN
0.0000 ug/min | INTRAVENOUS | Status: DC
  Filled 2014-01-14: qty 2

## 2014-01-14 MED ORDER — POTASSIUM CHLORIDE 10 MEQ/50ML IV SOLN: 10.0000 meq | INTRAVENOUS | Status: AC

## 2014-01-14 MED ORDER — METOPROLOL TARTRATE 12.5 MG HALF TABLET
12.5000 mg | ORAL_TABLET | Freq: Two times a day (BID) | ORAL | Status: DC
  Administered 2014-01-15 – 2014-01-20 (×10): 12.5 mg via ORAL
  Filled 2014-01-14 (×13): qty 1

## 2014-01-14 MED ORDER — HEPARIN SODIUM (PORCINE) 1000 UNIT/ML IJ SOLN
INTRAMUSCULAR | Status: DC | PRN
  Administered 2014-01-14: 25000 [IU] via INTRAVENOUS

## 2014-01-14 MED ORDER — PANTOPRAZOLE SODIUM 40 MG PO TBEC: 40.0000 mg | DELAYED_RELEASE_TABLET | Freq: Every day | ORAL | Status: DC

## 2014-01-14 MED ORDER — NITROGLYCERIN IN D5W 200-5 MCG/ML-% IV SOLN: 0.0000 ug/min | INTRAVENOUS | Status: DC

## 2014-01-14 MED ORDER — PROTAMINE SULFATE 10 MG/ML IV SOLN
INTRAVENOUS | Status: DC | PRN
  Administered 2014-01-14: 200 mg via INTRAVENOUS

## 2014-01-14 MED ORDER — HEPARIN SODIUM (PORCINE) 1000 UNIT/ML IJ SOLN
INTRAMUSCULAR | Status: AC
  Filled 2014-01-14: qty 1

## 2014-01-14 MED ORDER — SODIUM CHLORIDE 0.9 % IR SOLN
Status: DC | PRN
  Administered 2014-01-14 (×2)

## 2014-01-14 MED ORDER — CLOPIDOGREL BISULFATE 75 MG PO TABS
75.0000 mg | ORAL_TABLET | Freq: Every day | ORAL | Status: DC
  Administered 2014-01-15: 75 mg via ORAL
  Filled 2014-01-14 (×2): qty 1

## 2014-01-14 MED ORDER — MAGNESIUM SULFATE 40 MG/ML IJ SOLN
4.0000 g | Freq: Once | INTRAMUSCULAR | Status: AC
  Administered 2014-01-14: 4 g via INTRAVENOUS
  Filled 2014-01-14: qty 100

## 2014-01-14 MED ORDER — LACTATED RINGERS IV SOLN
500.0000 mL | Freq: Once | INTRAVENOUS | Status: AC | PRN
  Administered 2014-01-14: 20 mL via INTRAVENOUS

## 2014-01-14 MED ORDER — TRAMADOL HCL 50 MG PO TABS
50.0000 mg | ORAL_TABLET | Freq: Four times a day (QID) | ORAL | Status: DC | PRN
  Administered 2014-01-14: 50 mg via ORAL
  Filled 2014-01-14: qty 1

## 2014-01-14 MED ORDER — MORPHINE SULFATE 2 MG/ML IJ SOLN
2.0000 mg | INTRAMUSCULAR | Status: DC | PRN
  Filled 2014-01-14: qty 2

## 2014-01-14 MED ORDER — METOPROLOL TARTRATE 1 MG/ML IV SOLN: 2.5000 mg | INTRAVENOUS | Status: DC | PRN

## 2014-01-14 MED ORDER — MILRINONE IN DEXTROSE 20 MG/100ML IV SOLN
INTRAVENOUS | Status: DC | PRN
  Administered 2014-01-14: 0.5 ug/kg/min via INTRAVENOUS

## 2014-01-14 MED ORDER — ACETAMINOPHEN 160 MG/5ML PO SOLN
1000.0000 mg | Freq: Four times a day (QID) | ORAL | Status: DC
  Filled 2014-01-14: qty 40.6

## 2014-01-14 MED ORDER — SODIUM CHLORIDE 0.9 % IJ SOLN
INTRAMUSCULAR | Status: DC | PRN
  Administered 2014-01-14: 10 mL via INTRAVENOUS

## 2014-01-14 MED ORDER — ROCURONIUM BROMIDE 50 MG/5ML IV SOLN
INTRAVENOUS | Status: AC
Start: 2014-01-14 — End: 2014-01-14
  Filled 2014-01-14: qty 2

## 2014-01-14 MED ORDER — PROPOFOL 10 MG/ML IV BOLUS
INTRAVENOUS | Status: DC | PRN
  Administered 2014-01-14: 30 mg via INTRAVENOUS
  Administered 2014-01-14: 20 mg via INTRAVENOUS

## 2014-01-14 MED ORDER — EPINEPHRINE HCL 1 MG/ML IJ SOLN
INTRAMUSCULAR | Status: DC | PRN
  Administered 2014-01-14: 1 mg via INTRAVENOUS

## 2014-01-14 MED ORDER — SODIUM CHLORIDE 0.9 % IV SOLN
INTRAVENOUS | Status: DC
  Filled 2014-01-14: qty 1

## 2014-01-14 MED ORDER — INSULIN REGULAR BOLUS VIA INFUSION
0.0000 [IU] | Freq: Three times a day (TID) | INTRAVENOUS | Status: DC
  Filled 2014-01-14: qty 10

## 2014-01-14 MED ORDER — VANCOMYCIN HCL IN DEXTROSE 1-5 GM/200ML-% IV SOLN
1000.0000 mg | Freq: Once | INTRAVENOUS | Status: AC
  Administered 2014-01-14: 1000 mg via INTRAVENOUS
  Filled 2014-01-14: qty 200

## 2014-01-14 MED ORDER — SODIUM CHLORIDE 0.9 % IJ SOLN
3.0000 mL | Freq: Two times a day (BID) | INTRAMUSCULAR | Status: DC
  Administered 2014-01-14 – 2014-01-16 (×2): 3 mL via INTRAVENOUS

## 2014-01-14 MED ORDER — IODIXANOL 320 MG/ML IV SOLN
INTRAVENOUS | Status: DC | PRN
  Administered 2014-01-14: 150 mL via INTRAVENOUS

## 2014-01-14 SURGICAL SUPPLY — 130 items
ADAPTER CARDIOPLEGIA (MISCELLANEOUS) IMPLANT
ADH SKN CLS APL DERMABOND .7 (GAUZE/BANDAGES/DRESSINGS) ×2
ANTEGRADE CPLG (MISCELLANEOUS) IMPLANT
APL SKNCLS STERI-STRIP NONHPOA (GAUZE/BANDAGES/DRESSINGS) ×2
BAG BANDED W/RUBBER/TAPE 36X54 (MISCELLANEOUS) ×3 IMPLANT
BAG DECANTER FOR FLEXI CONT (MISCELLANEOUS) IMPLANT
BAG EQP BAND 135X91 W/RBR TAPE (MISCELLANEOUS) ×2
BAG SNAP BAND KOVER 36X36 (MISCELLANEOUS) ×6 IMPLANT
BATTERY PACK STR FOR DRIVER (MISCELLANEOUS) ×1 IMPLANT
BENZOIN TINCTURE PRP APPL 2/3 (GAUZE/BANDAGES/DRESSINGS) ×3 IMPLANT
BLADE OSCILLATING /SAGITTAL (BLADE) ×3 IMPLANT
BLADE STERNUM SYSTEM 6 (BLADE) ×3 IMPLANT
BLADE SURG ROTATE 9660 (MISCELLANEOUS) ×3 IMPLANT
CABLE PACING FASLOC BIEGE (MISCELLANEOUS) ×3 IMPLANT
CABLE PACING FASLOC BLUE (MISCELLANEOUS) ×3 IMPLANT
CANISTER SUCTION 2500CC (MISCELLANEOUS) ×6 IMPLANT
CANNULA FEM VENOUS REMOTE 22FR (CANNULA) IMPLANT
CANNULA FEMORAL ART 14 SM (MISCELLANEOUS) IMPLANT
CANNULA GUNDRY RCSP 15FR (MISCELLANEOUS) IMPLANT
CANNULA OPTISITE PERFUSION 16F (CANNULA) IMPLANT
CANNULA OPTISITE PERFUSION 18F (CANNULA) IMPLANT
CANNULA SOFTFLOW AORTIC 7M21FR (CANNULA) IMPLANT
CANNULA VENOUS LOW PROF 34X46 (CANNULA) IMPLANT
CATH DIAG EXPO 6F VENT PIG 145 (CATHETERS) ×2 IMPLANT
CATH EXPO 5FR FR4 (CATHETERS) ×1 IMPLANT
CATH HEART VENT LEFT (CATHETERS) IMPLANT
CATH JR4 65CM 5FR (CATHETERS) ×1 IMPLANT
CATH S G BIP PACING (SET/KITS/TRAYS/PACK) ×4 IMPLANT
CATH THORACIC 28FR (CATHETERS) ×1 IMPLANT
CLIP TI MEDIUM 24 (CLIP) ×1 IMPLANT
CLIP TI MEDIUM 6 (CLIP) ×3 IMPLANT
CLIP TI WIDE RED SMALL 24 (CLIP) ×1 IMPLANT
CLIP TI WIDE RED SMALL 6 (CLIP) ×3 IMPLANT
CONN ST 1/4X3/8  BEN (MISCELLANEOUS) ×1
CONN ST 1/4X3/8 BEN (MISCELLANEOUS) IMPLANT
CONNECTOR 1/2X3/8X1/2 3 WAY (MISCELLANEOUS)
CONNECTOR 1/2X3/8X1/2 3WAY (MISCELLANEOUS) IMPLANT
CONT SPECI 4OZ STER CLIK (MISCELLANEOUS) ×3 IMPLANT
COVER BACK TABLE 24X17X13 BIG (DRAPES) ×3 IMPLANT
COVER DOME SNAP 22 D (MISCELLANEOUS) ×3 IMPLANT
COVER PROBE W GEL 5X96 (DRAPES) IMPLANT
COVER SURGICAL LIGHT HANDLE (MISCELLANEOUS) ×3 IMPLANT
COVER TABLE BACK 60X90 (DRAPES) ×9 IMPLANT
CRADLE DONUT ADULT HEAD (MISCELLANEOUS) ×3 IMPLANT
DERMABOND ADVANCED (GAUZE/BANDAGES/DRESSINGS) ×1
DERMABOND ADVANCED .7 DNX12 (GAUZE/BANDAGES/DRESSINGS) ×2 IMPLANT
DRAIN CHANNEL 28F RND 3/8 FF (WOUND CARE) IMPLANT
DRAIN CHANNEL 32F RND 10.7 FF (WOUND CARE) IMPLANT
DRAPE INCISE IOBAN 66X45 STRL (DRAPES) IMPLANT
DRAPE SLUSH/WARMER DISC (DRAPES) IMPLANT
DRAPE SURG IRRIG POUCH 19X23 (DRAPES) ×3 IMPLANT
DRILL BIT 2.2MM W/14M STOP (BIT) ×1 IMPLANT
ELECT BLADE 4.0 EZ CLEAN MEGAD (MISCELLANEOUS) ×3
ELECT BLADE 6.5 EXT (BLADE) IMPLANT
ELECT REM PT RETURN 9FT ADLT (ELECTROSURGICAL) ×6
ELECTRODE BLDE 4.0 EZ CLN MEGD (MISCELLANEOUS) IMPLANT
ELECTRODE REM PT RTRN 9FT ADLT (ELECTROSURGICAL) ×4 IMPLANT
FELT TEFLON 6X6 (MISCELLANEOUS) ×3 IMPLANT
FEMORAL VENOUS CANN RAP (CANNULA) IMPLANT
GLOVE BIOGEL M 6.5 STRL (GLOVE) IMPLANT
GLOVE BIOGEL M STER SZ 6 (GLOVE) IMPLANT
GLOVE ECLIPSE 7.5 STRL STRAW (GLOVE) ×3 IMPLANT
GLOVE ECLIPSE 8.0 STRL XLNG CF (GLOVE) ×6 IMPLANT
GLOVE EUDERMIC 7 POWDERFREE (GLOVE) ×6 IMPLANT
GLOVE ORTHO TXT STRL SZ7.5 (GLOVE) ×9 IMPLANT
GOWN PREVENTION PLUS XLARGE (GOWN DISPOSABLE) ×21 IMPLANT
GOWN STRL NON-REIN LRG LVL3 (GOWN DISPOSABLE) ×9 IMPLANT
GUIDEWIRE SAF TJ AMPL .035X180 (WIRE) ×4 IMPLANT
GUIDEWIRE SAFE TJ AMPLATZ EXST (WIRE) ×1 IMPLANT
GUIDEWIRE STRAIGHT .035 260CM (WIRE) ×1 IMPLANT
HEMOSTAT POWDER SURGIFOAM 1G (HEMOSTASIS) IMPLANT
KIT DILATOR VASC 18G NDL (KITS) IMPLANT
KIT HEART LEFT (KITS) ×1 IMPLANT
KIT ROOM TURNOVER OR (KITS) ×3 IMPLANT
KIT SUCTION CATH 14FR (SUCTIONS) ×9 IMPLANT
LEAD PACING MYOCARDI (MISCELLANEOUS) IMPLANT
NDL PERC 18GX7CM (NEEDLE) ×2 IMPLANT
NEEDLE PERC 18GX7CM (NEEDLE) ×9 IMPLANT
NS IRRIG 1000ML POUR BTL (IV SOLUTION) ×21 IMPLANT
PACK AORTA (CUSTOM PROCEDURE TRAY) ×3 IMPLANT
PAD ARMBOARD 7.5X6 YLW CONV (MISCELLANEOUS) ×6 IMPLANT
PAD ELECT DEFIB RADIOL ZOLL (MISCELLANEOUS) ×3 IMPLANT
PATCH TACHOSII LRG 9.5X4.8 (VASCULAR PRODUCTS) IMPLANT
PLATE UNIVERSAL 8 HOLE (Plate) ×1 IMPLANT
RETRACTOR TRL SOFT TISSUE LG (INSTRUMENTS) IMPLANT
RETRACTOR TRM SOFT TISSUE 7.5 (INSTRUMENTS) IMPLANT
SCREW SELF TAP MAT 2.9X14MM (Screw) ×5 IMPLANT
SHEATH PINNACLE 6F 10CM (SHEATH) ×5 IMPLANT
SPONGE GAUZE 4X4 12PLY (GAUZE/BANDAGES/DRESSINGS) ×3 IMPLANT
SPONGE GAUZE 4X4 12PLY STER LF (GAUZE/BANDAGES/DRESSINGS) ×3 IMPLANT
SPONGE LAP 4X18 X RAY DECT (DISPOSABLE) ×3 IMPLANT
STOPCOCK MORSE 400PSI 3WAY (MISCELLANEOUS) ×4 IMPLANT
SUCKER INTRACARDIAC WEIGHTED (SUCKER) ×1 IMPLANT
SUT BONE WAX W31G (SUTURE) IMPLANT
SUT ETHIBOND 2 0 SH (SUTURE) ×3
SUT ETHIBOND 2 0 SH 36X2 (SUTURE) ×2 IMPLANT
SUT ETHIBOND X763 2 0 SH 1 (SUTURE) ×7 IMPLANT
SUT MNCRL AB 3-0 PS2 18 (SUTURE) ×6 IMPLANT
SUT PROLENE 2 0 MH 48 (SUTURE) ×12 IMPLANT
SUT PROLENE 4 0 RB 1 (SUTURE) ×3
SUT PROLENE 4-0 RB1 .5 CRCL 36 (SUTURE) ×2 IMPLANT
SUT SILK  1 MH (SUTURE) ×1
SUT SILK 1 MH (SUTURE) ×2 IMPLANT
SUT SILK 1 TIES 10X30 (SUTURE) ×3 IMPLANT
SUT SILK 2 0 SH CR/8 (SUTURE) ×3 IMPLANT
SUT SILK 2 0SH CR/8 30 (SUTURE) IMPLANT
SUT TEM PAC WIRE 2 0 SH (SUTURE) IMPLANT
SUT VIC AB 1 CTX 36 (SUTURE) ×3
SUT VIC AB 1 CTX36XBRD ANBCTR (SUTURE) ×2 IMPLANT
SUT VIC AB 2-0 CTX 36 (SUTURE) ×6 IMPLANT
SUT VIC AB 3-0 SH 8-18 (SUTURE) ×6 IMPLANT
SUT VICRYL 2 TP 1 (SUTURE) IMPLANT
SYR 30ML LL (SYRINGE) ×6 IMPLANT
SYR 3ML LL SCALE MARK (SYRINGE) ×3 IMPLANT
SYR 50ML LL SCALE MARK (SYRINGE) ×3 IMPLANT
SYR MEDRAD MARK V 150ML (SYRINGE) ×3 IMPLANT
SYSTEM SAHARA CHEST DRAIN ATS (WOUND CARE) ×3 IMPLANT
TAPE CLOTH SURG 4X10 WHT LF (GAUZE/BANDAGES/DRESSINGS) ×3 IMPLANT
TOWEL OR 17X24 6PK STRL BLUE (TOWEL DISPOSABLE) ×9 IMPLANT
TOWEL OR 17X26 10 PK STRL BLUE (TOWEL DISPOSABLE) ×6 IMPLANT
TRANSDUCER W/STOPCOCK (MISCELLANEOUS) ×2 IMPLANT
TRAY FOLEY IC TEMP SENS 14FR (CATHETERS) ×3 IMPLANT
TUBING ART PRESS 72  MALE/FEM (TUBING) ×1
TUBING ART PRESS 72 MALE/FEM (TUBING) IMPLANT
TUBING HIGH PRESSURE 120CM (CONNECTOR) ×3 IMPLANT
UNDERPAD 30X30 INCONTINENT (UNDERPADS AND DIAPERS) ×3 IMPLANT
VALVE HRT TRANSCATH ASCEND 26M (Valve) ×1 IMPLANT
VENT LEFT HEART 12002 (CATHETERS)
WATER STERILE IRR 1000ML POUR (IV SOLUTION) ×6 IMPLANT
WIRE .035 3MM-J 145CM (WIRE) ×1 IMPLANT

## 2014-01-14 NOTE — Preoperative (Signed)
Beta Blockers   Reason not to administer Beta Blockers:Hold beta blocker due to hypotension, Hold beta blocker due to hypovolemia 

## 2014-01-14 NOTE — Op Note (Signed)
CARDIOTHORACIC SURGERY OPERATIVE NOTE  Date of Procedure:  01/14/2014  Preoperative Diagnosis:   Severe Aortic Stenosis   Moderate-severe Left Ventricular Dysfunction  Moderate-severe Mitral Regurgitation  Acute Exacerbation of Chronic Combined Systolic and Diastolic Congestive Heart Failure  Bilateral Pleural Effusions  Postoperative Diagnosis:    Severe Aortic Stenosis   Moderate-severe Left Ventricular Dysfunction  Moderate Mitral Regurgitation  Acute Exacerbation of Chronic Combined Systolic and Diastolic Congestive Heart Failure  Bilateral Pleural Effusions   Procedure:    Transcatheter Aortic Valve Replacement - Transaortic Approach via Right Anterior Mini Thoracotomy  Edwards Sapien XT THV (size 26 mm, model # 9300TFX, serial # [id number])   Left Chest Tube Placement    Co-Surgeons:  Clarence H. 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 The patient reports that he was first told that he had a heart murmur and likely bicuspid aortic valve when he was in medical school. The patient has never formally been evaluated by a cardiologist until this current hospitalization. He reports that all of his life he was  quite active physically and actually played tournament-level tennis most of his life. 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 Last April he suffered an acute stroke associated with left-sided weakness that was attributed to treatment he had been receiving for metastatic prostate cancer. An echocardiogram performed at that time demonstrated severe aortic stenosis with normal left ventricular systolic function. The patient was not referred to a cardiologist. He recovered from his stroke and reports that he had return to essentially normal activity with no residual weakness. However, approximately 3 months ago he began to develop symptoms of shortness of breath, anorexia, and progressive generalized weakness. Ultimately he developed lower extremity edema, resting shortness of breath and orthopnea. He denies any history of chest pain, PND, dizzy spells, or syncope. He reports occasional palpitations over the past 2 months associated with shortness of breath. In addition, for the past 2 months he reports significant anorexia and he thinks he may have lost 10-15 pounds in weight. He reportedly has become extremely weak. He has not had any difficulty swallowing and bowel function has been regular.   Patient was admitted to [facility] 12/30/2013 with shortness of breath and failure to thrive. He has had 2 falls at home because of severe weakness in his legs with unstable gait. He denies any dizzy spells or syncope. Followup echocardiogram was performed demonstrating severe aortic stenosis with peak velocity across the aortic valve greater than 4 m/s corresponding to peak and mean transvalvular gradients of 66 and 43 mm mercury respectively. Left ventricular systolic function is now severely reduced with ejection fraction estimated 30-35%. During this hospitalization the patient has been  noted to be in atrial fibrillation intermittently with controlled ventricular rate.  The patient was seen in consultation by Dr. Stanford Breed, and cardiothoracic surgical consultation was requested to discuss treatment options and consider whether or not to proceed with further diagnostic workup.  During the course of the patient's preoperative work up they have been evaluated comprehensively by a multidisciplinary team of specialists coordinated through the [facility] in the [place] [facility].  They have been demonstrated to suffer from symptomatic severe aortic stenosis as noted above. The patient has been counseled extensively as to the relative risks and benefits of all options for the treatment of severe aortic stenosis including long term medical therapy, conventional surgery for aortic valve replacement, and transcatheter aortic valve replacement.  The patient has been independently evaluated by two cardiac surgeons including myself and Dr. Cyndia Bent, and both of [place] feel that the patient would be a poor candidate for conventional surgery (predicted risk of mortality >15% and/or predicted risk of permanent morbidity >50%) because of comorbidities including metastatic prostate cancer with decompensated combined systolic and diastolic congestive heart failure with severe generalized weakness and physically debilitated state.   Based upon review of all of the patient's preoperative diagnostic tests they are felt to be candidate for transcatheter aortic valve replacement using the transaortic approach as an alternative to high risk conventional surgery.    Following the decision to proceed with transcatheter aortic valve replacement, a discussion has been held regarding what types of management strategies would be attempted intraoperatively in the event of life-threatening complications, including whether or not the patient would be considered a candidate for the use of  cardiopulmonary bypass and/or conversion to open sternotomy for attempted surgical intervention.  The patient has been advised of a variety of complications that might develop peculiar to this approach including but not limited to risks of death, stroke, paravalvular leak, aortic dissection or other major vascular complications, aortic annulus rupture, device embolization, cardiac rupture or perforation, acute myocardial infarction, arrhythmia, heart block or bradycardia requiring permanent pacemaker placement, congestive heart failure, respiratory failure, renal failure, pneumonia, infection, other late complications related to structural valve deterioration or migration, or other complications that might ultimately cause a temporary or permanent loss of functional independence or other long term morbidity.  The patient provides full informed consent for the procedure as described and all questions were answered preoperatively.    DETAILS OF THE OPERATIVE PROCEDURE  PREPARATION:    The patient is brought to the operating room on the above mentioned date and central monitoring was established by the anesthesia team including placement of Swan-Ganz catheter and radial arterial line. The patient is placed in the supine position on the operating table.  Intravenous antibiotics are administered. General endotracheal anesthesia is induced uneventfully. A Foley catheter is placed.  Baseline transesophageal echocardiogram was performed.  Findings were notable for severe calcific aortic stenosis with moderate-severe global LV systolic dysfunction.  There was moderate-severe (3+) mitral regurgitation.  There was type I mitral valve dysfunction with normal leaflet mobility and dilated mitral annulus.  There was mild aortic insufficiency.  After the echocardiogram findings were reviewed the patient was begun on milrinone continuous infusion.  The patient's right neck, chest, abdomen, both groins, and both lower  extremities are prepared and draped in a sterile manner. A time out procedure is performed.   PERIPHERAL ACCESS:    Bilateral femoral arterial and venous access is obtained with placement of 6 Fr arterial sheaths on both sides.  A pigtail diagnostic catheter is passed through the left femoral  arterial sheath under fluoroscopic guidance into the aortic root.  A temporary transvenous pacemaker catheter is passed through the left femoral venous sheath under fluoroscopic guidance into the right ventricle.  The pacemaker is tested to ensure stable lead placement and pacemaker capture.   TRANSAORTIC ACCESS:   A right anterior mini thoracotomy incision is performed overlying the 2nd intercostal space.  The 2nd costal cartilage is divided medially to facilitate exposure.  The anterior surface of the ascending aorta and aortic arch are exposed.  An appropriate site for cannulation is chosen and a double purse string suture is placed using pledgeted 2-0 Ethibond suture.  The patient is heparinized systemically and ACT verified > 250 seconds.  The aorta is punctured using an 18 gauge needle and a soft J-tipped guidewire is passed into the aortic root and across the aortic valve into the left ventricle.  A 6 Fr sheath is placed over the guidewire.  A pigtail catheter is passed into the left ventricle.  Baseline simultaneous LV and aortic pressures are recorded.  An Amplatz extra stiff guidewire is passed through the pigtail catheter into the left ventricle and both the MPA-1 catheter and the introducing sheath are removed.  An Edwards Ascendra 3 introducer sheath is passed over the guidewire into the aorta.  The sheath position is continuously monitored and secured by the surgical assistant.     BALLOON AORTIC VALVULOPLASTY:  An Edwards Ascendra BAV catheter (model #9100 BAVC, 8mm x 3 cm balloon) is advanced over the guidewire and through the introducer sheath into the left ventricle and across the aortic  valve.  The position of the BAV catheter is verified flouroscopically with approximately 50% of the balloon length above and below the plane of the aortic annulus.  Balloon aortic valvuloplasty is performed while temporarily holding ventilation and during rapid ventricular pacing to maintain systolic blood pressure < 50 mmHg and pulse pressure < 10 mmHg.  The balloon is removed and TEE reassessed for valve and left ventricular function.   The patient's hemodynamic recovery following BAV is rapid and uneventful.   TRANSCATHETER HEART VALVE DEPLOYMENT:  An Berniece Pap XT transcatheter (size 26 mm, model # 9300TFX, serial [id number]) is prepared and crimped per manufacturer's guidelines, and the proper orientation of the valve is confirmed on the [employer] 3 delivery system.  The delivery system loader is advanced into the introducing sheath.  The valve and delivery system are advanced through the loader into the sheath and all air is evacuated.  The valve and balloon are advanced part way across the aortic valve.  The valve pusher is retracted.  The valve is then finely positioned in the aortic valve and its position verified using an aortogram while temporarily holding ventilation and rapid pacing.  Valve position is also confirmed using TEE.  Once final position of the valve has been confirmed, the valve is deployed while temporarily holding ventilation and during rapid ventricular pacing to maintain systolic blood pressure < 50 mmHg and pulse pressure < 10 mmHg.  The balloon inflation is held for >3 seconds after reaching full deployment volume.  Once the balloon has fully deflated the balloon is retracted into the left ventricle and valve function is assessed using TEE.   There is felt to be mild paravalvular leak and no central aortic insufficiency.  Left ventricular function is unchanged from preoperatively.  There is mild to moderate mitral regurgitation.  The patient's hemodynamic recovery  following valve deployment is uneventful.  Post-deployment dilatation is performed  using the deployment balloon with 1.0 mL extra volume added to the balloon during rapid pacing.  Following post-deployment dilatation the degree of paravalvular leak noted by TEE was completely gone with no residual paravalvular leak.  The deployment balloon is removed and a J-tipped catheter advanced across the valve into the left ventricle.  The guidewire is removed and simultaneous left ventricular and aortic pressures are recorded.   PROCEDURE COMPLETION:  The aortic sheath is removed during rapid ventricular pacing to maintain systolic blood pressure < 70 mmHg while the pursestring sutures are tied.  The aortic closure is inspected and notably intact.  Protamine is administered.    Once hemostasis has been ascertained, the pericardial space and right pleural space are drained using a 28 Fr Bard chest tube.  The 2nd costal cartilage is reattached to the sternum using a Synthes plate.   The mini thoracotomy incision is closed in layers and the skin incision closed using a subcuticular skin closure.   The temporary pacemaker, pigtail catheters and all femoral sheaths are removed.  A 28 Fr chest tube is placed into the left pleural space to drain a moderate sized pleural effusion.  The patient tolerated the procedure well and is transported to the surgical intensive care in stable condition. There are no intraoperative complications. All sponge instrument and needle counts are verified correct at completion of the operation.  No blood products were administered during the operation.  The patient received a total of 60.5 mL of intravenous contrast during the procedure.    Valentina Gu. Roxy Manns MD 01/14/2014 11:04 AM

## 2014-01-14 NOTE — Procedures (Signed)
Extubation Procedure Note  Patient Details:   Name: Lucas Green DOB: 07-24-1932 MRN: [id number]   Airway Documentation:     Evaluation  O2 sats: stable throughout Complications: No apparent complications Patient did tolerate procedure well. Bilateral Breath Sounds: Clear   Yes, Pt. able to lift head off bed on command, + cuff leak, NIF(-20), FVC(2.2L), placed on 4 lpm n/c, no Stridor noted s/p extubation, I/S to be started asap, RT to monitor.  Winferd Humphrey 01/14/2014, 5:50 PM

## 2014-01-14 NOTE — Progress Notes (Signed)
Pt. placed to cpap/ps per Rapid wean protocol, RN aware.

## 2014-01-14 NOTE — Op Note (Signed)
[facility]  TAVR OPERATIVE NOTE   Date of Procedure:  01/14/2014  Preoperative Diagnosis: Severe Aortic Stenosis   Postoperative Diagnosis: Same   Procedure:    Transcatheter Aortic Valve Replacement - Transaortic Approach  Edwards Sapien XT THV (size 26 mm, model # 9300TFX, serial # [id number])   Co-Surgeons:  Valentina Gu. Roxy Manns, MD and Sherren Mocha, MD  Assistants:   Gaye Pollack, MD and Lauree Chandler, MD  Anesthesiologist:  Laurie Panda, MD  Echocardiographer:  Ena Dawley, MD  Pre-operative Echo Findings:  severe aortic stenosis  Moderate-severe left ventricular systolic dysfunction  3+ MR  Post-operative Echo Findings:  no paravalvular leak  Improved but moderate left ventricular systolic dysfunction  2+ MR  BRIEF CLINICAL NOTE AND INDICATIONS FOR SURGERY  During the course of the patient's preoperative work up they have been evaluated comprehensively by a multidisciplinary team of specialists coordinated through the [facility] in the [place] [facility].  They have been demonstrated to suffer from symptomatic severe aortic stenosis as noted above. The patient has been counseled extensively as to the relative risks and benefits of all options for the treatment of severe aortic stenosis including long term medical therapy, conventional surgery for aortic valve replacement, and transcatheter aortic valve replacement.  The patient has been independently evaluated by two cardiac surgeons including Dr Roxy Manns and Dr. Cyndia Bent, and they are felt to be at high risk for conventional surgical aortic valve replacement. Both surgeons indicated the patient would be a poor candidate for conventional surgery (predicted risk of mortality >15% and/or predicted risk of permanent morbidity >50%) because of comorbidities including metastatic prostate cancer with decompensated combined systolic and diastolic congestive heart  failure with severe generalized weakness and physically debilitated state.  Based upon review of all of the patient's preoperative diagnostic tests they are felt to be candidate for transcatheter aortic valve replacement using the transaortic approach as an alternative to high risk conventional surgery.    Following the decision to proceed with transcatheter aortic valve replacement, a discussion has been held regarding what types of management strategies would be attempted intraoperatively in the event of life-threatening complications, including whether or not the patient would be considered a candidate for the use of cardiopulmonary bypass and/or conversion to open sternotomy for attempted surgical intervention.  The patient has been advised of a variety of complications that might develop peculiar to this approach including but not limited to risks of death, stroke, paravalvular leak, aortic dissection or other major vascular complications, aortic annulus rupture, device embolization, cardiac rupture or perforation, acute myocardial infarction, arrhythmia, heart block or bradycardia requiring permanent pacemaker placement, congestive heart failure, respiratory failure, renal failure, pneumonia, infection, other late complications related to structural valve deterioration or migration, or other complications that might ultimately cause a temporary or permanent loss of functional independence or other long term morbidity.  The patient provides full informed consent for the procedure as described and all questions were answered preoperatively.    DETAILS OF THE OPERATIVE PROCEDURE  PREPARATION:    The patient is brought to the operating room on the above mentioned date and central monitoring was established by the anesthesia team including placement of Swan-Ganz catheter and radial arterial line. The patient is placed in the supine position on the operating table.  Intravenous antibiotics are  administered. General endotracheal anesthesia is induced uneventfully. A Foley catheter is placed.  Baseline transesophageal echocardiogram was performed.    The patient's chest, abdomen,  both groins, and both lower extremities are prepared and draped in a sterile manner. A time out procedure is performed.   PERIPHERAL ACCESS:    Using the modified Seldinger technique, femoral arterial and venous access was obtained with placement of 6 Fr sheaths on both the right and left sides.  A pigtail diagnostic catheter was passed through the left femoral arterial sheath under fluoroscopic guidance into the aortic root.  A temporary transvenous pacemaker catheter was passed through the left femoral venous sheath under fluoroscopic guidance into the right ventricle.  The pacemaker was tested to ensure stable lead placement and pacemaker capture. Aortic root angiography was performed in order to determine the optimal angiographic angle for valve deployment.   TRANSAORTIC ACCESS: See the note of Dr Roxy Manns for complete details of access, valve deployment, and aortic closure.   At the completion of the procedure, the arterial and venous sheaths were removed and manual pressure used for hemostasis bilaterally. There was no evidence of hematoma on either side.  No blood products were administered during the operation.  The patient received a total of 60.5 mL of intravenous contrast during the procedure.  Sherren Mocha 01/14/2014 11:57 AM

## 2014-01-14 NOTE — Progress Notes (Signed)
Echocardiogram Echocardiogram Transesophageal has been performed.  Lucas Green 01/14/2014, 8:08 AM

## 2014-01-14 NOTE — Anesthesia Postprocedure Evaluation (Signed)
  Anesthesia Post-op Note  Patient: Lucas Green  Procedure(s) Performed: Procedure(s): INTRAOPERATIVE TRANSESOPHAGEAL ECHOCARDIOGRAM (N/A) TRANSCATHETER AORTIC VALVE REPLACEMENT, TRANSAORTIC (N/A) CHEST TUBE INSERTION (Left)  Patient Location: ICU  Anesthesia Type:General  Level of Consciousness: Patient remains intubated per anesthesia plan  Airway and Oxygen Therapy: Patient remains intubated per anesthesia plan  Post-op Pain: none  Post-op Assessment: Post-op Vital signs reviewed, Patient's Cardiovascular Status Stable, Respiratory Function Stable, No signs of Nausea or vomiting and Pain level controlled  Post-op Vital Signs: Reviewed and stable  Complications: No apparent anesthesia complications

## 2014-01-14 NOTE — Progress Notes (Signed)
CT surgery p.m. Rounds  Status post T. aVR earlier today, now extubated Normal sinus rhythm, hemodynamic stable Breathing comfortably, neuro intact Complains of mild right back pain probably related to right chest tube Doing well

## 2014-01-14 NOTE — Transfer of Care (Signed)
Immediate Anesthesia Transfer of Care Note  Patient: Lucas Green  Procedure(s) Performed: Procedure(s): INTRAOPERATIVE TRANSESOPHAGEAL ECHOCARDIOGRAM (N/A) TRANSCATHETER AORTIC VALVE REPLACEMENT, TRANSAORTIC (N/A) CHEST TUBE INSERTION (Left)  Patient Location: SICU  Anesthesia Type:General  Level of Consciousness: sedated, unresponsive and Patient remains intubated per anesthesia plan  Airway & Oxygen Therapy: Patient placed on Ventilator (see vital sign flow sheet for setting)  Post-op Assessment: Report given to PACU RN and Post -op Vital signs reviewed and stable  Post vital signs: Reviewed and stable  Complications: No apparent anesthesia complications

## 2014-01-14 NOTE — Progress Notes (Signed)
UR Completed.  Lucas Green Jane [phone] 01/14/2014  

## 2014-01-14 NOTE — Progress Notes (Signed)
Pt. to 40%/rate-4, per rapid wean protocol.

## 2014-01-14 NOTE — Anesthesia Preprocedure Evaluation (Signed)
Anesthesia Evaluation  Patient identified by MRN, date of birth, ID band Patient awake    Reviewed: Allergy & Precautions, H&P , NPO status , Patient's Chart, lab work & pertinent test results, reviewed documented beta blocker date and time , Unable to perform ROS - Chart review only  History of Anesthesia Complications Negative for: history of anesthetic complications  Airway       Dental   Pulmonary pneumonia -, resolved,  bilat pleural effusions          Cardiovascular Exercise Tolerance: Poor hypertension, Pt. on medications and Pt. on home beta blockers +CHF + dysrhythmias Atrial Fibrillation IV+ Valvular Problems/Murmurs AS and MR  Severe AS ef 30-35%    Neuro/Psych PSYCHIATRIC DISORDERS Depression  Neuromuscular disease CVA, No Residual Symptoms    GI/Hepatic hiatal hernia, GERD-  Medicated and Controlled,Esophageal stricture    Endo/Other    Renal/GU      Musculoskeletal   Abdominal   Peds  Hematology   Anesthesia Other Findings   Reproductive/Obstetrics                           Anesthesia Physical Anesthesia Plan  ASA: V  Anesthesia Plan: General   Post-op Pain Management:    Induction: Intravenous and Cricoid pressure planned  Airway Management Planned: Oral ETT  Additional Equipment: Arterial line, CVP, PA Cath, 3D TEE and Ultrasound Guidance Line Placement  Intra-op Plan:   Post-operative Plan: Post-operative intubation/ventilation  Informed Consent: I have reviewed the patients History and Physical, chart, labs and discussed the procedure including the risks, benefits and alternatives for the proposed anesthesia with the patient or authorized representative who has indicated his/her understanding and acceptance.   Dental advisory given  Plan Discussed with: CRNA, Anesthesiologist and Surgeon  Anesthesia Plan Comments:         Anesthesia Quick Evaluation

## 2014-01-14 NOTE — OR Nursing (Signed)
1st call to SICU 

## 2014-01-15 ENCOUNTER — Inpatient Hospital Stay (HOSPITAL_COMMUNITY): Payer: Medicare Other

## 2014-01-15 DIAGNOSIS — Z954 Presence of other heart-valve replacement: Secondary | ICD-10-CM

## 2014-01-15 DIAGNOSIS — I059 Rheumatic mitral valve disease, unspecified: Secondary | ICD-10-CM

## 2014-01-15 LAB — BASIC METABOLIC PANEL
BUN: 16 mg/dL (ref 6–23)
BUN: 15 mg/dL (ref 6–23)
CALCIUM: 8.6 mg/dL (ref 8.4–10.5)
CALCIUM: 8.9 mg/dL (ref 8.4–10.5)
CHLORIDE: 100 meq/L (ref 96–112)
CHLORIDE: 101 meq/L (ref 96–112)
CO2: 19 meq/L (ref 19–32)
CO2: 21 mEq/L (ref 19–32)
Creatinine, Ser: 0.99 mg/dL (ref 0.50–1.35)
Creatinine, Ser: 1.03 mg/dL (ref 0.50–1.35)
GFR calc non Af Amer: 76 mL/min — ABNORMAL LOW (ref >90)
GFR, EST AFRICAN AMERICAN: 88 mL/min — AB (ref >90)
GFR, EST AFRICAN AMERICAN: 78 mL/min — AB (ref >90)
GFR, EST NON AFRICAN AMERICAN: 67 mL/min — AB (ref >90)
Glucose, Bld: 114 mg/dL — ABNORMAL HIGH (ref 70–99)
Glucose, Bld: 110 mg/dL — ABNORMAL HIGH (ref 70–99)
Potassium: 3.9 mEq/L (ref 3.7–5.3)
Potassium: 4.6 mEq/L (ref 3.7–5.3)
SODIUM: 134 meq/L — AB (ref 137–147)
Sodium: 136 mEq/L — ABNORMAL LOW (ref 137–147)

## 2014-01-15 LAB — CBC
HCT: 27 % — ABNORMAL LOW (ref 39.0–52.0)
HEMATOCRIT: 31.5 % — AB (ref 39.0–52.0)
Hemoglobin: 9.2 g/dL — ABNORMAL LOW (ref 13.0–17.0)
Hemoglobin: 10.9 g/dL — ABNORMAL LOW (ref 13.0–17.0)
MCH: 31.9 pg (ref 26.0–34.0)
MCH: 32.7 pg (ref 26.0–34.0)
MCHC: 34.1 g/dL (ref 30.0–36.0)
MCHC: 34.6 g/dL (ref 30.0–36.0)
MCV: 93.8 fL (ref 78.0–100.0)
MCV: 94.6 fL (ref 78.0–100.0)
PLATELETS: 231 10*3/uL (ref 150–400)
PLATELETS: 237 10*3/uL (ref 150–400)
RBC: 2.88 MIL/uL — ABNORMAL LOW (ref 4.22–5.81)
RBC: 3.33 MIL/uL — ABNORMAL LOW (ref 4.22–5.81)
RDW: 13.4 % (ref 11.5–15.5)
RDW: 13.5 % (ref 11.5–15.5)
WBC: 9.6 10*3/uL (ref 4.0–10.5)
WBC: 10.9 10*3/uL — AB (ref 4.0–10.5)

## 2014-01-15 LAB — GLUCOSE, CAPILLARY
GLUCOSE-CAPILLARY: 103 mg/dL — AB (ref 70–99)
GLUCOSE-CAPILLARY: 104 mg/dL — AB (ref 70–99)
GLUCOSE-CAPILLARY: 107 mg/dL — AB (ref 70–99)
GLUCOSE-CAPILLARY: 110 mg/dL — AB (ref 70–99)
GLUCOSE-CAPILLARY: 118 mg/dL — AB (ref 70–99)
GLUCOSE-CAPILLARY: 126 mg/dL — AB (ref 70–99)
Glucose-Capillary: 102 mg/dL — ABNORMAL HIGH (ref 70–99)
Glucose-Capillary: 109 mg/dL — ABNORMAL HIGH (ref 70–99)
Glucose-Capillary: 112 mg/dL — ABNORMAL HIGH (ref 70–99)
Glucose-Capillary: 116 mg/dL — ABNORMAL HIGH (ref 70–99)
Glucose-Capillary: 117 mg/dL — ABNORMAL HIGH (ref 70–99)
Glucose-Capillary: 147 mg/dL — ABNORMAL HIGH (ref 70–99)
Glucose-Capillary: 148 mg/dL — ABNORMAL HIGH (ref 70–99)
Glucose-Capillary: 95 mg/dL (ref 70–99)
Glucose-Capillary: 96 mg/dL (ref 70–99)
Glucose-Capillary: 97 mg/dL (ref 70–99)
Glucose-Capillary: 97 mg/dL (ref 70–99)

## 2014-01-15 LAB — MAGNESIUM
MAGNESIUM: 2.4 mg/dL (ref 1.5–2.5)
Magnesium: 2.4 mg/dL (ref 1.5–2.5)

## 2014-01-15 MED ORDER — WARFARIN SODIUM 2 MG PO TABS
2.0000 mg | ORAL_TABLET | Freq: Every day | ORAL | Status: DC
  Administered 2014-01-15 – 2014-01-16 (×2): 2 mg via ORAL
  Filled 2014-01-15 (×3): qty 1

## 2014-01-15 MED ORDER — SODIUM CHLORIDE 0.9 % IJ SOLN: 3.0000 mL | INTRAMUSCULAR | Status: DC | PRN

## 2014-01-15 MED ORDER — GUAIFENESIN ER 600 MG PO TB12
600.0000 mg | ORAL_TABLET | Freq: Two times a day (BID) | ORAL | Status: DC | PRN
  Administered 2014-01-15: 600 mg via ORAL
  Filled 2014-01-15: qty 1

## 2014-01-15 MED ORDER — SIMVASTATIN 20 MG PO TABS
20.0000 mg | ORAL_TABLET | Freq: Every day | ORAL | Status: DC
  Administered 2014-01-15 – 2014-01-17 (×3): 20 mg via ORAL
  Filled 2014-01-15 (×4): qty 1

## 2014-01-15 MED ORDER — ACETAMINOPHEN 10 MG/ML IV SOLN
INTRAVENOUS | Status: AC
  Filled 2014-01-15: qty 100

## 2014-01-15 MED ORDER — SODIUM CHLORIDE 0.9 % IJ SOLN
INTRAMUSCULAR | Status: AC
  Filled 2014-01-15: qty 20

## 2014-01-15 MED ORDER — POTASSIUM CHLORIDE CRYS ER 20 MEQ PO TBCR
20.0000 meq | EXTENDED_RELEASE_TABLET | Freq: Every day | ORAL | Status: DC
  Administered 2014-01-16 – 2014-01-18 (×3): 20 meq via ORAL
  Filled 2014-01-15 (×3): qty 1

## 2014-01-15 MED ORDER — HYDROCOD POLST-CHLORPHEN POLST 10-8 MG/5ML PO LQCR: 5.0000 mL | Freq: Two times a day (BID) | ORAL | Status: DC | PRN

## 2014-01-15 MED ORDER — FUROSEMIDE 10 MG/ML IJ SOLN
20.0000 mg | Freq: Four times a day (QID) | INTRAMUSCULAR | Status: AC
  Administered 2014-01-15 (×3): 20 mg via INTRAVENOUS
  Filled 2014-01-15 (×3): qty 2

## 2014-01-15 MED ORDER — GUAIFENESIN ER 600 MG PO TB12
600.0000 mg | ORAL_TABLET | Freq: Two times a day (BID) | ORAL | Status: DC
  Administered 2014-01-15 – 2014-01-18 (×7): 600 mg via ORAL
  Filled 2014-01-15 (×9): qty 1

## 2014-01-15 MED ORDER — FUROSEMIDE 40 MG PO TABS
40.0000 mg | ORAL_TABLET | Freq: Two times a day (BID) | ORAL | Status: DC
  Administered 2014-01-16: 40 mg via ORAL
  Filled 2014-01-15 (×3): qty 1

## 2014-01-15 MED ORDER — WARFARIN - PHYSICIAN DOSING INPATIENT: Freq: Every day | Status: DC

## 2014-01-15 MED ORDER — ENOXAPARIN SODIUM 30 MG/0.3ML ~~LOC~~ SOLN
30.0000 mg | SUBCUTANEOUS | Status: DC
  Administered 2014-01-16 – 2014-01-20 (×5): 30 mg via SUBCUTANEOUS
  Filled 2014-01-15 (×6): qty 0.3

## 2014-01-15 MED ORDER — SODIUM CHLORIDE 0.9 % IV SOLN: 250.0000 mL | INTRAVENOUS | Status: DC | PRN

## 2014-01-15 MED ORDER — POTASSIUM CHLORIDE 10 MEQ/50ML IV SOLN
10.0000 meq | INTRAVENOUS | Status: AC
  Administered 2014-01-15 (×3): 10 meq via INTRAVENOUS
  Filled 2014-01-15: qty 50

## 2014-01-15 MED ORDER — SODIUM CHLORIDE 0.9 % IJ SOLN
3.0000 mL | Freq: Two times a day (BID) | INTRAMUSCULAR | Status: DC
  Administered 2014-01-15 – 2014-01-19 (×9): 3 mL via INTRAVENOUS

## 2014-01-15 MED ORDER — POLYVINYL ALCOHOL 1.4 % OP SOLN
1.0000 [drp] | OPHTHALMIC | Status: DC | PRN
  Administered 2014-01-15: 1 [drp] via OPHTHALMIC
  Filled 2014-01-15 (×2): qty 15

## 2014-01-15 MED ORDER — MOVING RIGHT ALONG BOOK
Freq: Once | Status: AC
  Administered 2014-01-15: 09:00:00
  Filled 2014-01-15: qty 1

## 2014-01-15 MED ORDER — INSULIN ASPART 100 UNIT/ML ~~LOC~~ SOLN
0.0000 [IU] | SUBCUTANEOUS | Status: DC
  Administered 2014-01-15 (×2): 2 [IU] via SUBCUTANEOUS

## 2014-01-15 MED FILL — Sodium Chloride IV Soln 0.9%: INTRAVENOUS | Qty: 1000 | Status: AC

## 2014-01-15 MED FILL — Heparin Sodium (Porcine) Inj 1000 Unit/ML: INTRAMUSCULAR | Qty: 30 | Status: AC

## 2014-01-15 MED FILL — Magnesium Sulfate Inj 50%: INTRAMUSCULAR | Qty: 10 | Status: AC

## 2014-01-15 MED FILL — Potassium Chloride Inj 2 mEq/ML: INTRAVENOUS | Qty: 40 | Status: AC

## 2014-01-15 MED FILL — Insulin Regular (Human) Inj 100 Unit/ML: INTRAMUSCULAR | Qty: 1 | Status: AC

## 2014-01-15 NOTE — Progress Notes (Signed)
[name]       [place] [address]             [phone]        CARDIOTHORACIC SURGERY PROGRESS NOTE   R1 Day Post-Op Procedure(s) (LRB): INTRAOPERATIVE TRANSESOPHAGEAL ECHOCARDIOGRAM (N/A) TRANSCATHETER AORTIC VALVE REPLACEMENT, TRANSAORTIC (N/A) CHEST TUBE INSERTION (Left)  Subjective: Still reporting no pain. Walking regularly. Ready for transfer.  Objective: Vital signs: BP Readings from Last 1 Encounters:  01/15/14 102/48   Pulse Readings from Last 1 Encounters:  01/15/14 77   Resp Readings from Last 1 Encounters:  01/15/14 22   Temp Readings from Last 1 Encounters:  01/15/14 98 F (36.7 C) Oral    Hemodynamics: PAP: (27-70)/(3-50) 33/5 mmHg CO:  [6.5 L/min-9.4 L/min] 9.4 L/min CI:  [3.2 L/min/m2-4.6 L/min/m2] 4.6 L/min/m2  Physical Exam:  Rhythm:   Sinus  Breath sounds: Clear to auscultation  Heart sounds:  RRR, no murmurs, rubs, or gallops  Incisions:  Clean and dry  Abdomen:  Soft and nontender  Extremities:  Warm and well perfused   Intake/Output from previous day: 02/03 0701 - 02/04 0700 In: 4645.1 [I.V.:3195.1; IV Piggyback:1450] Out: 2325 [Urine:1445; Blood:300; Chest Tube:580] Intake/Output this shift: Total I/O In: 310.6 [I.V.:160.6; IV Piggyback:150] Out: 1976 [Urine:1875; Stool:1; Chest Tube:100]  Lab Results:  CBC: Recent Labs  01/14/14 1730 01/14/14 1733 01/15/14 0410  WBC 9.7  --  9.6  HGB 9.4* 8.8* 9.2*  HCT 27.8* 26.0* 27.0*  PLT 235  --  231    BMET:  Recent Labs  01/14/14 0400  01/14/14 1733 01/15/14 0410  NA 137  < > 135* 134*  K 4.4  < > 4.0 3.9  CL 99  --  103 100  CO2 19  --   --  19  GLUCOSE 95  < > 149* 114*  BUN 24*  --  19 16  CREATININE 1.15  < > 1.10 0.99  CALCIUM 9.5  --   --  8.6  < > = values in this interval not displayed.   CBG (last 3)   Recent Labs  01/15/14 0903 01/15/14 1152 01/15/14 1520  GLUCAP 110* 107* 126*    ABG    Component Value Date/Time   PHART 7.347* 01/14/2014 1737   PCO2ART 38.3 01/14/2014 1737   PO2ART 84.0 01/14/2014 1737   HCO3 21.1 01/14/2014 1737   TCO2 22 01/14/2014 1737   ACIDBASEDEF 4.0* 01/14/2014 1737   O2SAT 96.0 01/14/2014 1737    CXR:   CLINICAL DATA: Postop aortic valve surgery.  EXAM:  PORTABLE CHEST - 1 VIEW  COMPARISON: DG CHEST 1V PORT dated 01/14/2014; CT HEART MORP W/CTA  COR W/SCORE W/CA W/CM &/OR WO/CM dated 01/07/2014  FINDINGS:  Interval extubation. Nasogastric tube has been removed as well.  Right IJ Swan-Ganz catheter remains in place with the tip projecting  over the distal main or proximal right pulmonary artery. Bilateral  chest tubes are in place.  Trachea is midline. Heart size is grossly stable. The inferior  aspect of the left hemi thorax was not included on the image. No  definite pneumothorax. Left basilar airspace disease is mild.  Subcutaneous emphysema along the right chest wall.  Extensive degenerative change in the right shoulder.  IMPRESSION:  1. Mild left lower lobe airspace disease.  2. No definite pneumothorax with bilateral chest tubes in place.  Note is made that the inferior left hemi thorax was not included on  the image.  Electronically Signed  By: Lorin Picket M.D.  On: 01/15/2014 07:21   2D Echocardiogram  Study Conclusions  - Left ventricle: The cavity size was mildly dilated. Wall thickness was increased in a pattern of mild LVH. Systolic function was mildly reduced. The estimated ejection fraction was 45%, in the range of 45% to 50%. Diffuse hypokinesis. Regional wall motion abnormalities cannot be excluded. - Aortic valve: A bioprosthesis was present. - Mitral valve: Mild regurgitation. - Left atrium: The atrium was mildly dilated.  Impressions:  - Technically difficult; Limited study to assess AVR post TAVR; full doppler study not performed. LV function appears to be mildly reduced; well seated prosthetic aortic valve with normal mean gradient and no  AI. Transthoracic echocardiography. M-mode, limited 2D, limited spectral Doppler, and color Doppler.   Prepared and Electronically Authenticated by  [name] 2015-02-04T14:37:[id number]   Assessment/Plan: S/P Procedure(s) (LRB): INTRAOPERATIVE TRANSESOPHAGEAL ECHOCARDIOGRAM (N/A) TRANSCATHETER AORTIC VALVE REPLACEMENT, TRANSAORTIC (N/A) CHEST TUBE INSERTION (Left)  No changes to report. Cough still persistent on Mucinex 600 mg BID. Tussinex not given because patient does not want it since it contains narcotics. Continue with plan to transfer.   Lucas Green 01/15/2014 3:53 PM

## 2014-01-15 NOTE — Progress Notes (Signed)
I have seen and examined the patient and agree with the assessment and plan as outlined.  [name],CLARENCE H 01/15/2014 4:41 PM

## 2014-01-15 NOTE — Progress Notes (Signed)
Pt c/o persistent cough. MD notified. Orders received for Mucinex 600 mg BID prn and Tussionex 5 cc now and BID prn for cough. Pt states that he will not take Tussionex since it contains narcotics. For that reason, Tussionex "now" dose not ordered. Mucinex administered. Will continue to monitor pt.

## 2014-01-15 NOTE — Progress Notes (Signed)
Pt t/x to 2W24 in wheelchair on monitor without event. RN Minette Brine present for pt arrival to unit for tele hookup and bedside report. Pt's wife phoned to make aware of room change  .Lucas Green

## 2014-01-15 NOTE — Progress Notes (Signed)
Order to remove foley- per patient- MD Roxy Manns told him he could keep it today and will not let me remove it. He just received IV lasix and prefers to keep foley in. Will defer to RN on 2W and to MD Roxy Manns for evening rounds  Lorre Munroe

## 2014-01-15 NOTE — Progress Notes (Signed)
SUBJECTIVE: Feels well. Only c/o cough.   Tele: NSR  BP 129/52  Pulse 87  Temp(Src) 98.4 F (36.9 C) (Core (Comment))  Resp 22  Ht 6\' 2"  (1.88 m)  Wt 184 lb 1.4 oz (83.5 kg)  BMI 23.62 kg/m2  SpO2 96%  Intake/Output Summary (Last 24 hours) at 01/15/14 0730 Last data filed at 01/15/14 0700  Gross per 24 hour  Intake 4595.11 ml  Output   2325 ml  Net 2270.11 ml    PHYSICAL EXAM General: Well developed, well nourished, in no acute distress. Alert and oriented x 3.  Psych:  Good affect, responds appropriately Neck: No JVD. No masses noted.  Lungs: Coarse BS bilaterally.   Heart: RRR with no murmurs noted. Abdomen: Bowel sounds are present. Soft, non-tender.  Extremities: No lower extremity edema.   LABS: Basic Metabolic Panel:  Recent Labs  01/14/14 0400  01/14/14 1730 01/14/14 1733 01/15/14 0410  NA 137  < >  --  135* 134*  K 4.4  < >  --  4.0 3.9  CL 99  --   --  103 100  CO2 19  --   --   --  19  GLUCOSE 95  < >  --  149* 114*  BUN 24*  --   --  19 16  CREATININE 1.15  --  1.00 1.10 0.99  CALCIUM 9.5  --   --   --  8.6  MG  --   --  2.6*  --  2.4  < > = values in this interval not displayed. CBC:  Recent Labs  01/14/14 1730 01/14/14 1733 01/15/14 0410  WBC 9.7  --  9.6  HGB 9.4* 8.8* 9.2*  HCT 27.8* 26.0* 27.0*  MCV 92.7  --  93.8  PLT 235  --  231   Current Meds: . acetaminophen  1,000 mg Oral Q6H   Or  . acetaminophen (TYLENOL) oral liquid 160 mg/5 mL  1,000 mg Per Tube Q6H  . aspirin EC  325 mg Oral Daily   Or  . aspirin  324 mg Per Tube Daily  . cefUROXime (ZINACEF)  IV  1.5 g Intravenous Q12H  . clopidogrel  75 mg Oral Q breakfast  . famotidine (PEPCID) IV  20 mg Intravenous Q12H  . guaiFENesin  600 mg Oral BID  . insulin regular  0-10 Units Intravenous TID WC  . metoprolol tartrate  12.5 mg Oral BID   Or  . metoprolol tartrate  12.5 mg Per Tube BID  . pantoprazole  40 mg Oral Daily  . [START ON 01/16/2014] pantoprazole  40 mg  Oral Daily  . sodium chloride  3 mL Intravenous Q12H   ASSESSMENT AND PLAN: 80 y.o. male w/ PMHx significant for prostate CA with bone mets who has been hospitalized for congestive heart failure since 1/19. The patient is a retired [employer] operational officer. He describes several months of progressive dyspnea, leg swelling, nausea, generalized weakness, and anorexia. He was noted to have clinical signs of CHF as well as radiographic evidence with a left pleural effusion and pulmonary edema on CXR. An echo showed severe global LV dysfunction and severe aortic stenosis. He diuresed well over the last two weeks and has been clinically stable. TAVR on 01/14/14.   1. Severe aortic stenosis: Admitted with CHF, failure to thrive. Severe AS felt to be contributing to decline. S/p TAVR on 01/14/14. The procedure went well. He is stable this am. Continue ASA  and Plavix. Echo today to evaluate valve, degree of MR. Hopefully can d/c milrinone today and ambulate.   2. Acute systolic heart failure with severe left ventricular dysfunction: Net positive  2.2 liters last 24 hours with IV fluids given in OR. Would resume Lasix 40 mg po Qdaily today.   3. Paroxysmal atrial fibrillation: He is maintaining sinus rhythm. Continue low dose beta blocker.   4. Metastatic prostate CA: Per oncology notes, expected at least one year survival given current state of his metastatic disease to the bone   5. Left pleural effusion: Chest tubes in place. Per CT surgery. Chest x-ray this am. No significant effusions present. Pt with cough. As above, would restart Lasix today.    Lucas Green,Lucas Green  2/4/20157:30 AM

## 2014-01-15 NOTE — Progress Notes (Signed)
[address]       [place] [address]             [phone]        CARDIOTHORACIC SURGERY PROGRESS NOTE   R1 Day Post-Op Procedure(s) (LRB): INTRAOPERATIVE TRANSESOPHAGEAL ECHOCARDIOGRAM (N/A) TRANSCATHETER AORTIC VALVE REPLACEMENT, TRANSAORTIC (N/A) CHEST TUBE INSERTION (Left)  Subjective: Patient reports no pain this morning. He only complains of a productive cough that hurts his chest and made him throw up last night. Doesn't feel nauseated.  Objective: Vital signs: BP Readings from Last 1 Encounters:  01/15/14 129/52   Pulse Readings from Last 1 Encounters:  01/15/14 87   Resp Readings from Last 1 Encounters:  01/15/14 22   Temp Readings from Last 1 Encounters:  01/15/14 98.4 F (36.9 C)     Hemodynamics: PAP: (25-70)/(9-50) 46/15 mmHg CO:  [4 L/min-8.8 L/min] 8.3 L/min CI:  [1.9 L/min/m2-4.3 L/min/m2] 4.1 L/min/m2  Physical Exam:  Rhythm:   Sinus  Breath sounds: Crackles in the lower lobes bilaterally  Heart sounds:  RRR, no murmurs, rubs or gallops  Incisions:  Clean and dry  Abdomen:  Soft and nontender  Extremities:  Warm and well perfused   Intake/Output from previous day: 02/03 0701 - 02/04 0700 In: 4595.1 [I.V.:3145.1; IV Piggyback:1450] Out: 2325 [Urine:1445; Blood:300; Chest Tube:580] Intake/Output this shift:    Lab Results:  CBC: Recent Labs  01/14/14 1730 01/14/14 1733 01/15/14 0410  WBC 9.7  --  9.6  HGB 9.4* 8.8* 9.2*  HCT 27.8* 26.0* 27.0*  PLT 235  --  231    BMET:  Recent Labs  01/14/14 0400  01/14/14 1733 01/15/14 0410  NA 137  < > 135* 134*  K 4.4  < > 4.0 3.9  CL 99  --  103 100  CO2 19  --   --  19  GLUCOSE 95  < > 149* 114*  BUN 24*  --  19 16  CREATININE 1.15  < > 1.10 0.99  CALCIUM 9.5  --   --  8.6  < > = values in this interval not displayed.   CBG (last 3)   Recent Labs  01/15/14 0300 01/15/14 0358 01/15/14 0500  GLUCAP 97 109* 116*    ABG    Component Value  Date/Time   PHART 7.347* 01/14/2014 1737   PCO2ART 38.3 01/14/2014 1737   PO2ART 84.0 01/14/2014 1737   HCO3 21.1 01/14/2014 1737   TCO2 22 01/14/2014 1737   ACIDBASEDEF 4.0* 01/14/2014 1737   O2SAT 96.0 01/14/2014 1737    CXR: Mild left lobe airspace disease. No pneumothorax.  EKG: normal sinus rhythm   Assessment/Plan: S/P Procedure(s) (LRB): INTRAOPERATIVE TRANSESOPHAGEAL ECHOCARDIOGRAM (N/A) TRANSCATHETER AORTIC VALVE REPLACEMENT, TRANSAORTIC (N/A) CHEST TUBE INSERTION (Left)  1. CV-post transcatheter aortic valve replacement-regular rate and rhythm. Milrinone 0.15 mcg/min. On aspirin 325mg , Plavix 75mg  once a day, lopressor 12.5mg  BID. Last BP was 129/52 2. Pulmonary-encourage IS. Chest tube-minimal drainage overnight 3. Acute blood loss anemia- H&H is 9.2 and 27.0 4. Last glucose was 114. On insulin drip 0.4 units/hr. Hasn't had an appetite 5. Discontinue lines and tubes today per Dr. Resa Miner, Tessa, PA-S 01/15/2014 8:13 AM  I have seen and examined the patient and agree with the assessment and plan as outlined.  Looks very good POD1.  Wean milrinone off.  D/C tubes and lines.  Mobilize.  Diuresis.  Start coumadin slowly for PAF.  Continue ASA.  Will not restart Plavix since we are anticoagulating w/ coumadin.  Start ACE-I tomorrow if BP will tolerate.  Check routine POD1 ECHO.  Transfer step down.  Nabil Bubolz H 01/15/2014 8:35 AM

## 2014-01-16 ENCOUNTER — Inpatient Hospital Stay (HOSPITAL_COMMUNITY): Payer: Medicare Other

## 2014-01-16 ENCOUNTER — Other Ambulatory Visit: Payer: Self-pay | Admitting: *Deleted

## 2014-01-16 DIAGNOSIS — I359 Nonrheumatic aortic valve disorder, unspecified: Secondary | ICD-10-CM

## 2014-01-16 LAB — CBC
HEMATOCRIT: 31.8 % — AB (ref 39.0–52.0)
Hemoglobin: 10.7 g/dL — ABNORMAL LOW (ref 13.0–17.0)
MCH: 31.8 pg (ref 26.0–34.0)
MCHC: 33.6 g/dL (ref 30.0–36.0)
MCV: 94.6 fL (ref 78.0–100.0)
Platelets: 269 10*3/uL (ref 150–400)
RBC: 3.36 MIL/uL — ABNORMAL LOW (ref 4.22–5.81)
RDW: 13.6 % (ref 11.5–15.5)
WBC: 11 10*3/uL — AB (ref 4.0–10.5)

## 2014-01-16 LAB — PROTIME-INR
INR: 1.32 (ref 0.00–1.49)
Prothrombin Time: 16.1 seconds — ABNORMAL HIGH (ref 11.6–15.2)

## 2014-01-16 LAB — BASIC METABOLIC PANEL
BUN: 16 mg/dL (ref 6–23)
CHLORIDE: 100 meq/L (ref 96–112)
CO2: 21 mEq/L (ref 19–32)
CREATININE: 1.06 mg/dL (ref 0.50–1.35)
Calcium: 9.3 mg/dL (ref 8.4–10.5)
GFR calc Af Amer: 75 mL/min — ABNORMAL LOW (ref >90)
GFR, EST NON AFRICAN AMERICAN: 65 mL/min — AB (ref >90)
Glucose, Bld: 113 mg/dL — ABNORMAL HIGH (ref 70–99)
Potassium: 4.3 mEq/L (ref 3.7–5.3)
Sodium: 137 mEq/L (ref 137–147)

## 2014-01-16 LAB — GLUCOSE, CAPILLARY
Glucose-Capillary: 101 mg/dL — ABNORMAL HIGH (ref 70–99)
Glucose-Capillary: 99 mg/dL (ref 70–99)

## 2014-01-16 MED ORDER — BOOST / RESOURCE BREEZE PO LIQD
1.0000 | Freq: Two times a day (BID) | ORAL | Status: DC
  Administered 2014-01-16 – 2014-01-20 (×8): 1 via ORAL

## 2014-01-16 MED ORDER — FUROSEMIDE 40 MG PO TABS
40.0000 mg | ORAL_TABLET | Freq: Every day | ORAL | Status: DC
  Administered 2014-01-17 – 2014-01-18 (×2): 40 mg via ORAL
  Filled 2014-01-16 (×3): qty 1

## 2014-01-16 MED ORDER — ASPIRIN 81 MG PO CHEW
81.0000 mg | CHEWABLE_TABLET | Freq: Every day | ORAL | Status: DC
  Administered 2014-01-17 – 2014-01-20 (×4): 81 mg via ORAL
  Filled 2014-01-16 (×4): qty 1

## 2014-01-16 NOTE — Progress Notes (Signed)
Physical medicine rehabilitation consult requested. Will await physical and occupational therapy evaluations to be completed and followup with recommendations of rehabilitation consult at that time.

## 2014-01-16 NOTE — Progress Notes (Addendum)
      [address]       [place] [address]             [phone]        2 Days Post-Op Procedure(s) (LRB): INTRAOPERATIVE TRANSESOPHAGEAL ECHOCARDIOGRAM (N/A) TRANSCATHETER AORTIC VALVE REPLACEMENT, TRANSAORTIC (N/A) CHEST TUBE INSERTION (Left)  Subjective: Patient just finished breakfast. Has a cough, but no other complaints.  Objective: Vital signs in last 24 hours: Temp:  [97.3 F (36.3 C)-98.6 F (37 C)] 97.3 F (36.3 C) (02/05 0439) Pulse Rate:  [75-90] 84 (02/05 0439) Cardiac Rhythm:  [-] Normal sinus rhythm (02/04 2212) Resp:  [16-25] 18 (02/05 0439) BP: (95-125)/(45-70) 122/67 mmHg (02/05 0439) SpO2:  [93 %-99 %] 95 % (02/05 0439) Arterial Line BP: (138-164)/(33-44) 138/33 mmHg (02/04 1000) Weight:  [79.5 kg (175 lb 4.3 oz)] 79.5 kg (175 lb 4.3 oz) (02/05 0439)   Current Weight  01/16/14 79.5 kg (175 lb 4.3 oz)    Hemodynamic parameters for last 24 hours: PAP: (31-42)/(3-12) 33/5 mmHg CO:  [9.4 L/min] 9.4 L/min CI:  [4.6 L/min/m2] 4.6 L/min/m2  Intake/Output from previous day: 02/04 0701 - 02/05 0700 In: 360.6 [I.V.:160.6; IV Piggyback:200] Out: 5576 [Urine:5475; Stool:1; Chest Tube:100]   Physical Exam:  Cardiovascular: RRR, no murmurs, gallops, or rubs. Pulmonary: Clear on the right and slightly diminished at bases; no rales, wheezes, or rhonchi. Abdomen: Soft, non tender, bowel sounds present. Extremities: SCDs in place Wounds: Dressing is clean and dry.    Lab Results: CBC: Recent Labs  01/15/14 1550 01/16/14 0509  WBC 10.9* 11.0*  HGB 10.9* 10.7*  HCT 31.5* 31.8*  PLT 237 269   BMET:  Recent Labs  01/15/14 1550 01/16/14 0509  NA 136* 137  K 4.6 4.3  CL 101 100  CO2 21 21  GLUCOSE 110* 113*  BUN 15 16  CREATININE 1.03 1.06  CALCIUM 8.9 9.3    PT/INR:  Lab Results  Component Value Date   INR 1.32 01/16/2014   INR 1.32 01/14/2014   INR 1.17 01/01/2014   ABG:  INR: Will add last result for INR, ABG once  components are confirmed Will add last 4 CBG results once components are confirmed  Assessment/Plan:  1. CV - SR. On Lopressor 12.5 bid and Coumadin. INR 1.32 this am. 2.  Pulmonary -  CXR shows stable subcutaneous emphysema on the right, no pneumothorax, atelectasis on left.Encourage incentive spirometer.Mucinex for cough. 3. Volume Overload - On Lasix 40 bid 4.  Acute blood loss anemia - H and H stable at 10.7 and 31.8 5. Foley remains-will likely remove today 6. CBGs 148/101/99. Pre op HGA1C 5.8. Tolerating diet. Will stop accu checks and SS PRN 7. Will discuss disposition with Dr. Eddie Candle MPA-C 01/16/2014,7:33 AM  I have seen and examined the patient and agree with the assessment and plan as outlined.  Doing very well.  Might benefit from low dose ACE-I - will discuss w/ Cardiology.  Consult PMR team - I think he would be a good candidate for inpatient rehab and potentially could go tomorrow.  Chidera Dearcos H 01/16/2014 8:44 AM

## 2014-01-16 NOTE — Progress Notes (Signed)
Physical Therapy Treatment Patient Details Name: BARTOW ZYLSTRA MRN: [id number] DOB: [birth year] Today's Date: 01/16/2014 Time: 0932-3557 PT Time Calculation (min): 24 min  PT Assessment / Plan / Recommendation  History of Present Illness 80 yo male admitted with weakness, pleural effusion, FTT. Hx of HTN, CVA, arthritis, prostate cancer with mets, GERD.  Work up for TAVR.     PT Comments   Pt progressing well post TAVR.  Will continue with similar goals.  Follow Up Recommendations  SNF;Other (comment) (versus home if able to get enough assist)     Does the patient have the potential to tolerate intense rehabilitation     Barriers to Discharge        Equipment Recommendations  None recommended by PT    Recommendations for Other Services    Frequency Min 3X/week   Progress towards PT Goals Progress towards PT goals: Progressing toward goals  Plan Current plan remains appropriate    Precautions / Restrictions Precautions Precautions: Fall   Pertinent Vitals/Pain Mild dyspnea after returning to bed post ambulation.    Mobility  Bed Mobility Overal bed mobility: Needs Assistance Bed Mobility: Sidelying to Sit Sidelying to sit: Min guard Supine to sit: Supervision;HOB elevated General bed mobility comments: increased time Transfers Overall transfer level: Needs assistance Transfers: Sit to/from Stand Sit to Stand: Min guard General transfer comment: cues for safety Ambulation/Gait Ambulation/Gait assistance: Min guard Ambulation Distance (Feet): 300 Feet Assistive device: Rolling walker (2 wheeled) Gait Pattern/deviations: Step-through pattern;Trunk flexed Gait velocity: decreased Gait velocity interpretation: Below normal speed for age/gender    Exercises General Exercises - Lower Extremity Hip ABduction/ADduction: AROM;Strengthening;10 reps;Standing;Both Mini-Sqauts: AROM;Strengthening;15 reps;Standing   PT Diagnosis:    PT Problem List:   PT Treatment  Interventions:     PT Goals (current goals can now be found in the care plan section) Acute Rehab PT Goals PT Goal Formulation: With patient/family Time For Goal Achievement: 01/23/14 Potential to Achieve Goals: Good  Visit Information  Last PT Received On: 01/16/14 Assistance Needed: +1 History of Present Illness: 80 yo male admitted with weakness, pleural effusion, FTT. Hx of HTN, CVA, arthritis, prostate cancer with mets, GERD.  Work up for TAVR.      Subjective Data      Cognition  Cognition Arousal/Alertness: Awake/alert Behavior During Therapy: WFL for tasks assessed/performed Overall Cognitive Status: Within Functional Limits for tasks assessed    Balance  Balance Overall balance assessment: No apparent balance deficits (not formally assessed) Sitting balance-Leahy Scale: Good Standing balance support: Bilateral upper extremity supported Standing balance-Leahy Scale: Good  End of Session PT - End of Session Activity Tolerance: Patient tolerated treatment well Patient left: in bed;with call bell/phone within reach;with family/visitor present Nurse Communication: Mobility status   GP     Timber Lucarelli, Tessie Fass 01/16/2014, 3:40 PM 01/16/2014  Donnella Sham, [place] [phone]  (pager)

## 2014-01-16 NOTE — Progress Notes (Signed)
CARDIAC REHAB PHASE I   PRE:  Rate/Rhythm: 85 SR    BP: sitting 106/54    SaO2: 98 RA  MODE:  Ambulation: 250 ft   POST:  Rate/Rhythm: 103 ST    BP: sitting 127/63     SaO2: 97 RA  Pt sts he has not walked since surgery. Used RW, assist x2 for safety. Steady, no major c/o. Increased congestion/coughing with mobility. To recliner. Encouraged sitting up, walking, and IS. Can be x1. [id number]   Josephina Shih Ferguson CES, ACSM 01/16/2014 11:54 AM

## 2014-01-16 NOTE — Progress Notes (Signed)
    Subjective:  A little frustrated with the degree of weakness he is experiencing. Otherwise doing well. No pain or dyspnea.  Objective:  Vital Signs in the last 24 hours: Temp:  [97.3 F (36.3 C)-98 F (36.7 C)] 97.4 F (36.3 C) (02/05 1408) Pulse Rate:  [78-90] 78 (02/05 1408) Resp:  [18] 18 (02/05 1408) BP: (95-122)/(55-67) 98/59 mmHg (02/05 1408) SpO2:  [95 %-98 %] 98 % (02/05 1408) Weight:  [175 lb 4.3 oz (79.5 kg)] 175 lb 4.3 oz (79.5 kg) (02/05 0439)  Intake/Output from previous day: 02/04 0701 - 02/05 0700 In: 720.6 [P.O.:360; I.V.:160.6; IV Piggyback:200] Out: 5576 [Urine:5475; Stool:1; Chest Tube:100]  Physical Exam: Pt is alert and oriented, pleasant elderly male in NAD HEENT: normal Neck: JVP - normal, carotids 2+=  Lungs: CTA bilaterally CV: RRR without murmur or gallop Abd: soft, NT Ext: no C/C/E, distal pulses intact and equal Skin: warm/dry no rash   Lab Results:  Recent Labs  01/15/14 1550 01/16/14 0509  WBC 10.9* 11.0*  HGB 10.9* 10.7*  PLT 237 269    Recent Labs  01/15/14 1550 01/16/14 0509  NA 136* 137  K 4.6 4.3  CL 101 100  CO2 21 21  GLUCOSE 110* 113*  BUN 15 16  CREATININE 1.03 1.06   No results found for this basename: TROPONINI, CK, MB,  in the last 72 hours  Cardiac Studies: 2D Echo (post-op): Study Conclusions  - Left ventricle: The cavity size was mildly dilated. Wall thickness was increased in a pattern of mild LVH. Systolic function was mildly reduced. The estimated ejection fraction was 45%, in the range of 45% to 50%. Diffuse hypokinesis. Regional wall motion abnormalities cannot be excluded. - Aortic valve: A bioprosthesis was present. - Mitral valve: Mild regurgitation. - Left atrium: The atrium was mildly dilated. Impressions:  - Technically difficult; Limited study to assess AVR post TAVR; full doppler study not performed. LV function appears to be mildly reduced; well seated prosthetic aortic valve  with normal mean gradient and no AI.  Tele: Sinus rhythm, personally reviewed  Assessment/Plan:  1. Severe AS s/p TAVR (POD # 2) 2. 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 PAF 4. Generalized weakness 5. Metastatic prostate CA  Overall making good progress. BP is borderline/low - will continue only low-dose metoprolol at this point. Don't think he will tolerate ACE. On warfarin for PAF with INR 1.3. Plans noted for inpatient rehab. Start ASA 81 mg.   Sherren Mocha, M.D. 01/16/2014, 6:07 PM

## 2014-01-16 NOTE — Discharge Summary (Signed)
Physician Discharge Summary       [address]       [place] [address]             [phone]    Patient ID: Lucas Green MRN: [id number] DOB/AGE: December 11, 1932 80 y.o.  Admit date: 12/30/2013 Discharge date: 01/20/2014  Admission Diagnoses: 1. Severe aortic stenosis 2. Moderate to severe left ventricular dysfunction 3. Moderate to severe mitral regurgitation 4. Acute exacerbation of chronic combined systolic and diastolic CHF 5. Bilateral  pleural effusions 6. History of hypertension 7. History of CVA (April 14') 8. History of prostate cancer (with bone mets-s/p radiation Oct 14')) 9. History of dyslipidemia 10. History of a fib 11. History of protein calorie malnutrition  Discharge Diagnoses:  1. Severe aortic stenosis 2. Moderate to severe left ventricular dysfunction 3. Moderate to severe mitral regurgitation 4. Acute exacerbation of chronic combined systolic and diastolic CHF 5. Bilateral  pleural effusions 6. History of hypertension 7. History of CVA (April 14') 8. History of prostate cancer (with bone mets-s/p radiation Oct 14')) 9. History of dyslipidemia 10. History of a fib 11. History of protein calorie malnutrition   Procedure (s):  Transcatheter Aortic Valve Replacement - Transaortic Approach Edwards Sapien XT THV (size 26 mm, model # 9300TFX, serial # [id number]) by Drs Shirley Friar, and McAlhany on 01/14/2014.   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 Alen Blew of oncology and also Dr. Hartley Barefoot, hypertension, dyslipidemia who presented to [facility] ED 12/30/2013 with complaints of progressive weakness, fatigue, failure to thrive over past couple of days prior to this admission.Pt reported ongoing nausea with associated vomiting. No significant abdominal  discomfort reported. No chest pain, no palpitations but he does endorse dyspnea on exertion and shortness of breath in general. No reports of blood in stool or urine. No diarrhea or constipation.  In the ED, his blood pressure was 134/65, HR 72, T 97.6 F and oxygen saturation 95%. Blood work revealed hypokalemia of 3.5, which was repleted in ED. CXR revealed moderate sized left pleural effusion. Pt was started on azithromycin and rocephin for possible pneumonia.   2 D echo showed severe AS, LVEF 30-35%, and peak gradient of AV 66 mm Hg.Right heart catheterization ws done on 1/26. Results showed patent coronary arteries with non obstructive CAD, severe calcific aortic stenosis, LV systolic dysfunction. CTA of chest, abdomen, and pelvis  showed potentially suitable pelvic arterial access (R>L) for TAVR, multiple aggressive appearing bony lesions (met prostate cancer to bone) more so on the right, bilateral pleural effusions (L>R). A cardiothoracic consultation was obtained with Dr. Cyndia Bent for consideration of TAVR. Potential risks, benefits, and complications were discussed with the patient and he agreed to proceed with surgery. He underwent TAVR on 01/14/2014.  Brief Hospital Course:  He was extubated without difficulty early the evening of surgery. He remained afebrile and hemodynamically stable. He was weaned off of Milrinone.He was started on Lopressor.He remained in sinus rhythm. He was initially restarted on Plavix (history of CVA). This was then stopped and he was put on Coumadin. His PT and INR were monitored daily.His last INR was up to 1.8.He did develop a productive cough and was given Mucinex for this. He refused Tussionex as it has hydrocodone in it. His foley remained until post operative day 2 as he was being given a fair  amount of lasix. He was felt surgically stable for transfer from the ICU to [facility] for further convalescence on 01/15/2014. He is tolerating a diet and has had multiple bowel  movements. He has had intermittent nausea post op. He did have emesis yesterday after breakfast. He tolerated lunch, dinner, and breakfast this am without difficulties. He has no abdominal pain. Zofran does relieve his nausea. He has been making progress with PT. He was not a CIR candidate and has a bed available today at [facility]. He is felt surgically stable for discharge today.  Latest Vital Signs: Blood pressure 116/54, pulse 87, temperature 97.7 F (36.5 C), temperature source Oral, resp. rate 18, height $RemoveBe'6\' 2"'pMiIbyEVA$  (1.88 m), weight 77.701 kg (171 lb 4.8 oz), SpO2 100.00%.  Physical Exam: Cardiovascular: RRR, no murmurs, gallops, or rubs.  Pulmonary: Clear on the right and slightly diminished at bases; no rales, wheezes, or rhonchi.  Abdomen: Soft, non tender, bowel sounds present.  Extremities: SCDs in place  Wounds: Dressing is clean and dry.   Discharge Condition:Stable  Recent laboratory studies:  Lab Results  Component Value Date   WBC 8.0 01/19/2014   HGB 12.0* 01/19/2014   HCT 34.9* 01/19/2014   MCV 94.6 01/19/2014   PLT 403* 01/19/2014   Lab Results  Component Value Date   NA 135* 01/19/2014   K 4.5 01/19/2014   CL 97 01/19/2014   CO2 23 01/19/2014   CREATININE 1.13 01/19/2014   GLUCOSE 101* 01/19/2014      Diagnostic Studies:  CLINICAL DATA: Status post cardiac surgery  EXAM:  CHEST 2 VIEW  COMPARISON: 01/16/2014  FINDINGS:  Cardiac shadow is stable. Postoperative changes are seen.  Subcutaneous emphysema is again noted on the right. No pneumothorax  is seen. No focal infiltrate is noted. Improved aeration in the left  base is noted when compared with the prior exam.  IMPRESSION:  No acute abnormality noted. Improved aeration is noted in the left  lung base. No pneumothorax is noted.  Electronically Signed  By: Inez Catalina M.D.  On: 01/19/2014 08:12   Ct Coronary Morp W/cta Cor W/score W/ca W/cm &/or Wo/cm  01/07/2014   ADDENDUM REPORT: 01/07/2014 19:48  CLINICAL DATA:  Severe  aortic stenosis  EXAM: Cardiac TAVR CT  TECHNIQUE: The patient was scanned on a Philips 256 scanner. A 120 kV retrospective scan was triggered in the descending thoracic aorta at 111 HU's. Gantry rotation speed was 270 msecs and collimation was .9 mm. No beta blockade or nitro were given. The 3D data set was reconstructed in 5% intervals of the R-R cycle. Systolic and diastolic phases were analyzed on a dedicated work station using MPR, MIP and VRT modes. The patient received 80 cc of contrast.  FINDINGS: Aortic Valve: Thickened and calcified aortic valve, predominantly of the left and non-coronary leaflet. Moderately to severely limited leaflet opening. No calcifications in the LVOT bellow the annulus.  Aorta: Normal size, Ascending aorta is free of calcifications, there are mild calcifications around the origin of brachiocephalic arteries non-obstructing flow.  Sinotubular Junction:  26 x 25 mm  Ascending Thoracic Aorta:  32 x 32 mm  Aortic Arch:  25 x 24 mm  Descending Thoracic Aorta:  24 x 24 mm  Sinus of Valsalva Measurements:  Non-coronary:  30 mm  Right -coronary:  30 mm  Left -coronary:  32 mm  Coronary Artery Height above Annulus:  Left Main:  13 mm  Right Coronary:  15 mm  Virtual Basal Annulus Measurements:  Maximum/Minimum Diameter:  26 x 23 mm  Perimeter:  86 mm  Area:  494 mm2  Optimum Fluoroscopic Angle for Delivery:  LAO 6 CAU 6  IMPRESSION: 1. Severely thickened and calcified aortic stenosis with limited leaflet excursions and measurements suitable for 26 mm Edwards-SAPIEN valve.  2.  Optimal deployment angle LAO 6 CAU 6.  3. Sufficient distance from annulus to both coronary arteries for 26 mm valve.  Ena Dawley   Electronically Signed   By: Ena Dawley   On: 01/07/2014 19:48    Ct Angio Chest Aorta W/cm &/or Wo/cm  01/07/2014   ADDENDUM REPORT: 01/07/2014 18:43   Electronically Signed   By: Ena Dawley   On: 01/07/2014 18:43   01/07/2014   CLINICAL DATA:  80 year old male with  history of aortic stenosis. Preprocedural study prior to potential transcatheter aortic valve replacement (TAVR) procedure.  EXAM: CT ANGIOGRAPHY CHEST, ABDOMEN AND PELVIS  TECHNIQUE: Multidetector CT imaging through the chest, abdomen and pelvis was performed using the standard protocol during bolus administration of intravenous contrast. Multiplanar reconstructed images and MIPs were obtained and reviewed to evaluate the vascular anatomy.  CONTRAST:  12mL OMNIPAQUE IOHEXOL 350 MG/ML SOLN  COMPARISON:  CT of abdomen and pelvis 08/20/2012.  FINDINGS: CT CHEST FINDINGS  Mediastinum: Heart size is normal. Mild calcifications of the mitral annulus. Severe calcifications and thickening of the aortic valve. Small amount of pericardial fluid and/or thickening, unlikely to be of hemodynamic significance at this time. There is atherosclerosis of the thoracic aorta, the great vessels of the mediastinum and the coronary arteries, including calcified atherosclerotic plaque in the left main, left anterior descending, left circumflex and right coronary arteries. No pathologically enlarged mediastinal or hilar lymph nodes. Esophagus is unremarkable in appearance.  Lungs/Pleura: Small right and moderate left pleural effusions with associated passive atelectasis in the lower lobes of the lungs bilaterally. No acute consolidative airspace disease. Mild diffuse bronchial wall thickening. No definite suspicious appearing pulmonary nodules or masses are identified at this time.  Musculoskeletal: Sclerotic lesion with healing pathologic fracture in the lateral aspect of the left seventh rib. Multiple well-defined lucent lesions associated with the endplates throughout the thoracic spine, favored to represent Schmorl's nodes.  CT ABDOMEN AND PELVIS FINDINGS  Abdomen/Pelvis: The appearance of the liver, gallbladder, pancreas, spleen, bilateral adrenal glands and bilateral kidneys is unremarkable. No significant volume of ascites. No  pneumoperitoneum. No pathologic distention of small bowel. No definite lymphadenopathy identified within the abdomen or pelvis. Prostate calcifications. Foley balloon catheter in place with tip in the lumen of the urinary bladder. Urinary bladder wall appears markedly thickened, however, the urinary bladder is completely collapsed.  Musculoskeletal: Numerous aggressive appearing sclerotic lesions are noted throughout the lumbar spine, bony pelvis and right proximal femur. Largest lesions include a sclerotic mass in the right ischium which appears to have an associated pathologic fracture, measuring at least 5.5 cm. There is also a pathologic fracture of the right superior and inferior pubic rami. Sclerotic lesion in the superior aspect of the right ilium with pathologic fracture.  VASCULAR MEASUREMENTS PERTINENT TO TAVR:  AORTA:  Minimal Aortic Diameter - 11 x 13 mm in the infrarenal abdominal aorta.  Severity of Aortic Calcification -  Severe (circumferential).  RIGHT PELVIS:  Right Common Iliac Artery -  Minimal Diameter - 7.6 x 5.6 mm  Tortuosity - Minimal  Calcification - Severe  Right External Iliac Artery -  Minimal Diameter - 7.0 x 8.3 mm  Tortuosity - Mild  Calcification - Minimal  Right Common Femoral Artery -  Minimal Diameter - 8.1 x 6.3 mm  Tortuosity - Mild  Calcification - Moderate  LEFT PELVIS:  Left Common Iliac Artery -  Minimal Diameter - 5.7 x 4.1 mm at site of short segment (nonpropagating) dissection.  Tortuosity - Minimal  Calcification - Severe  Left External Iliac Artery -  Minimal Diameter - 6.6 x 7.7 mm  Tortuosity - Mild  Calcification - Minimal  Left Common Femoral Artery -  Minimal Diameter - 6.7 x 9.1 mm  Tortuosity - Mild  Calcification - Moderate  Review of the MIP images confirms the above findings.  IMPRESSION: 1. Vascular findings and measurements pertinent to potential TAVR procedure, as above. This patient does have potentially suitable pelvic arterial access, likely more  preferable on the right given the short segment dissection of the left common iliac artery (minimal diameter of 5.7 x 4.1 mm at this level). 2. Multiple aggressive appearing bony lesions compatible with metastatic prostate cancer to the bone, most notably in the right hemipelvis where there are numerous pathologic fractures, as detailed above. 3. Small right and moderate left-sided pleural effusions. 4. Additional incidental findings, as above.  Electronically Signed: By: Vinnie Langton M.D. On: 01/07/2014 14:50   Ct Angio Abd/pel W/ And/or W/o  01/07/2014   ADDENDUM REPORT: 01/07/2014 18:43   Electronically Signed   By: Ena Dawley   On: 01/07/2014 18:43   01/07/2014   CLINICAL DATA:  80 year old male with history of aortic stenosis. Preprocedural study prior to potential transcatheter aortic valve replacement (TAVR) procedure.  EXAM: CT ANGIOGRAPHY CHEST, ABDOMEN AND PELVIS  TECHNIQUE: Multidetector CT imaging through the chest, abdomen and pelvis was performed using the standard protocol during bolus administration of intravenous contrast. Multiplanar reconstructed images and MIPs were obtained and reviewed to evaluate the vascular anatomy.  CONTRAST:  19mL OMNIPAQUE IOHEXOL 350 MG/ML SOLN  COMPARISON:  CT of abdomen and pelvis 08/20/2012.  FINDINGS: CT CHEST FINDINGS  Mediastinum: Heart size is normal. Mild calcifications of the mitral annulus. Severe calcifications and thickening of the aortic valve. Small amount of pericardial fluid and/or thickening, unlikely to be of hemodynamic significance at this time. There is atherosclerosis of the thoracic aorta, the great vessels of the mediastinum and the coronary arteries, including calcified atherosclerotic plaque in the left main, left anterior descending, left circumflex and right coronary arteries. No pathologically enlarged mediastinal or hilar lymph nodes. Esophagus is unremarkable in appearance.  Lungs/Pleura: Small right and moderate left pleural  effusions with associated passive atelectasis in the lower lobes of the lungs bilaterally. No acute consolidative airspace disease. Mild diffuse bronchial wall thickening. No definite suspicious appearing pulmonary nodules or masses are identified at this time.  Musculoskeletal: Sclerotic lesion with healing pathologic fracture in the lateral aspect of the left seventh rib. Multiple well-defined lucent lesions associated with the endplates throughout the thoracic spine, favored to represent Schmorl's nodes.  CT ABDOMEN AND PELVIS FINDINGS  Abdomen/Pelvis: The appearance of the liver, gallbladder, pancreas, spleen, bilateral adrenal glands and bilateral kidneys is unremarkable. No significant volume of ascites. No pneumoperitoneum. No pathologic distention of small bowel. No definite lymphadenopathy identified within the abdomen or pelvis. Prostate calcifications. Foley balloon catheter in place with tip in the lumen of the urinary bladder. Urinary bladder wall appears markedly thickened, however, the urinary bladder is completely collapsed.  Musculoskeletal: Numerous aggressive appearing sclerotic lesions are noted throughout the lumbar spine, bony pelvis and right proximal femur. Largest lesions include  a sclerotic mass in the right ischium which appears to have an associated pathologic fracture, measuring at least 5.5 cm. There is also a pathologic fracture of the right superior and inferior pubic rami. Sclerotic lesion in the superior aspect of the right ilium with pathologic fracture.  VASCULAR MEASUREMENTS PERTINENT TO TAVR:  AORTA:  Minimal Aortic Diameter - 11 x 13 mm in the infrarenal abdominal aorta.  Severity of Aortic Calcification -  Severe (circumferential).  RIGHT PELVIS:  Right Common Iliac Artery -  Minimal Diameter - 7.6 x 5.6 mm  Tortuosity - Minimal  Calcification - Severe  Right External Iliac Artery -  Minimal Diameter - 7.0 x 8.3 mm  Tortuosity - Mild  Calcification - Minimal  Right Common  Femoral Artery -  Minimal Diameter - 8.1 x 6.3 mm  Tortuosity - Mild  Calcification - Moderate  LEFT PELVIS:  Left Common Iliac Artery -  Minimal Diameter - 5.7 x 4.1 mm at site of short segment (nonpropagating) dissection.  Tortuosity - Minimal  Calcification - Severe  Left External Iliac Artery -  Minimal Diameter - 6.6 x 7.7 mm  Tortuosity - Mild  Calcification - Minimal  Left Common Femoral Artery -  Minimal Diameter - 6.7 x 9.1 mm  Tortuosity - Mild  Calcification - Moderate  Review of the MIP images confirms the above findings.  IMPRESSION: 1. Vascular findings and measurements pertinent to potential TAVR procedure, as above. This patient does have potentially suitable pelvic arterial access, likely more preferable on the right given the short segment dissection of the left common iliac artery (minimal diameter of 5.7 x 4.1 mm at this level). 2. Multiple aggressive appearing bony lesions compatible with metastatic prostate cancer to the bone, most notably in the right hemipelvis where there are numerous pathologic fractures, as detailed above. 3. Small right and moderate left-sided pleural effusions. 4. Additional incidental findings, as above.  Electronically Signed: By: Vinnie Langton M.D. On: 01/07/2014 14:50   US Thoracentesis Asp Pleural Space W/img Guide  12/31/2013   CLINICAL DATA:  Shortness of breath, left-sided pleural effusion. Request diagnostic and therapeutic thoracentesis.  EXAM: ULTRASOUND GUIDED left THORACENTESIS  COMPARISON:  None.  FINDINGS: A total of approximately 2 L of clear yellow fluid was removed. A fluid sample wassent for laboratory analysis.  IMPRESSION: Successful ultrasound guided left thoracentesis yielding 2 L of pleural fluid.  Read by: Ascencion Dike PA-C  PROCEDURE: An ultrasound guided thoracentesis was thoroughly discussed with the patient and questions answered. The benefits, risks, alternatives and complications were also discussed. The patient understands and  wishes to proceed with the procedure. Written consent was obtained.  Ultrasound was performed to localize and mark an adequate pocket of fluid in the left chest. The area was then prepped and draped in the normal sterile fashion. 1% Lidocaine was used for local anesthesia. Under ultrasound guidance a 19 gauge Yueh catheter was introduced. Thoracentesis was performed. The catheter was removed and a dressing applied.  Complications:  None immediate   Electronically Signed   By: Maryclare Bean M.D.   On: 12/31/2013 14:23        Future Appointments Provider Department Dept Phone   01/30/2014 11:00 AM Rogelia Mire, NP [place] [phone]   02/14/2014 1:00 PM [facility] [place] [facility] [phone]   02/14/2014 2:00 PM Rexene Alberts, MD [place] [phone]   02/14/2014 2:00 PM Sherren Mocha,  MD [place] [phone]     Discharge Medications:   Medication List    STOP taking these medications       bicalutamide 50 MG tablet  Commonly known as:  CASODEX     clopidogrel 75 MG tablet  Commonly known as:  PLAVIX     levofloxacin 750 MG tablet  Commonly known as:  LEVAQUIN     megestrol 40 MG tablet  Commonly known as:  MEGACE     metoprolol succinate 50 MG 24 hr tablet  Commonly known as:  TOPROL-XL     prochlorperazine 10 MG tablet  Commonly known as:  COMPAZINE     simvastatin 20 MG tablet  Commonly known as:  ZOCOR     V[facility] [place]      TAKE these medications       aspirin 81 MG chewable tablet  Chew 1 tablet (81 mg total) by mouth daily.     feeding supplement (RESOURCE BREEZE) Liqd  Take 1 Container by mouth 2 (two) times daily between meals.     ibuprofen 800 MG tablet  Commonly known as:  ADVIL,MOTRIN  Take 800 mg by mouth every 8 (eight) hours as needed for pain.     metoprolol tartrate 12.5 mg Tabs tablet  Commonly known  as:  LOPRESSOR  Take 0.5 tablets (12.5 mg total) by mouth 2 (two) times daily.     multivitamin with minerals Tabs tablet  Take 1 tablet by mouth daily.     ondansetron 4 MG/2ML Soln injection  Commonly known as:  ZOFRAN  Inject 2 mLs (4 mg total) into the vein every 6 (six) hours as needed for nausea or vomiting.     pantoprazole 40 MG tablet  Commonly known as:  PROTONIX  Take 40 mg by mouth daily.     polyvinyl alcohol 1.4 % ophthalmic solution  Commonly known as:  LIQUIFILM TEARS  Place 1 drop into both eyes as needed for dry eyes.     traMADol 50 MG tablet  Commonly known as:  ULTRAM  Take 1 tablet (50 mg total) by mouth every 6 (six) hours as needed for moderate pain.     warfarin 2.5 MG tablet  Commonly known as:  COUMADIN  Take 1 tablet (2.5 mg total) by mouth daily.        Follow Up Appointments: Follow-up Information   Follow up with [facility] TAVR On 02/14/2014. (Echo to be done at 1:00 pm)       Follow up with Murray Hodgkins, NP On 01/30/2014. (11 am)    Specialty:  Nurse Practitioner   Contact information:   [address] [place] [address] [phone]       Follow up with [facility] nurse. (Please remove chest tube suture on Thursday 01/23/2014)       Follow up with [facility]. (Please draw a PT and INR on Wednesday 01/22/2014 and call or fax results to Dr Antionette Char office.)       Signed: Lars Pinks MPA-C 01/20/2014, 11:33 AM

## 2014-01-16 NOTE — Progress Notes (Signed)
NUTRITION FOLLOW UP  Intervention:   Resume Resource Breeze po BID, each supplement provides 250 kcal and 9 grams of protein, between meals. Add HS snack of PB and Jelly sandwich with milk, per pt request. RD to follow for nutrition care plan.  Nutrition Dx: Increased nutrient needs related to catabolic illness as evidenced by estimated nutrition needs, ongoing  Goal:   Pt to meet >/= 90% of their estimated nutrition needs, met  Monitor:   PO & supplemental intake, weight, labs, I/O's  Assessment:   Patient with PMH of prostate cancer, HTN, dyslipidemia who presented to [facility] ED with complaints of progressive weakness, fatigue, failure to thrive over past couple of days prior to this admission; CXR revealed moderate sized left pleural effusion.   Patient transferred to [facility] for further cardiac workup, possible Transcatheter Aortic Valve Replacement.  Patient s/p procedure 1/26: CARDIAC CATHETERIZATION  Patient s/p procedure 2/3: TRANSCATHETER AORTIC VALVE REPLACEMENT L CHEST TUBE PLACEMENT  Ordered for Heart Healthy diet and is eating 75% of meals. Pt is eating well. He would like to resume [employer] (prefers to Ensure, received prior to surgery.) Wife at bedside asking if pt can have HS snack - will arrange for patient.  Per Oncology notes, patient's expected survival is at least one year given current state of his metastatic disease to the bone.  Height: Ht Readings from Last 1 Encounters:  01/14/14 $RemoveB'6\' 2"'GDPnGOLY$  (1.88 m)    Weight Status:   Wt Readings from Last 1 Encounters:  01/16/14 175 lb 4.3 oz (79.5 kg)  Admit wt 208 lb; pt is -22 liters I/O's since admit  Body mass index is 22.49 kg/(m^2). Normal weight  Re-estimated needs:  Kcal: 2100-2300 Protein: 110-120  gm Fluid: 2.1-2.3 L  Skin: chest incisions  Diet Order: Cardiac   Intake/Output Summary (Last 24 hours) at 01/16/14 1201 Last data filed at 01/16/14 0700  Gross per 24 hour  Intake    530 ml  Output    4176 ml  Net  -3646 ml    Last BM: 2/4  Labs:   Recent Labs Lab 01/14/14 1730  01/15/14 0410 01/15/14 1550 01/16/14 0509  NA  --   < > 134* 136* 137  K  --   < > 3.9 4.6 4.3  CL  --   < > 100 101 100  CO2  --   --  $R'19 21 21  'mz$ BUN  --   < > $R'16 15 16  'Qi$ CREATININE 1.00  < > 0.99 1.03 1.06  CALCIUM  --   --  8.6 8.9 9.3  MG 2.6*  --  2.4 2.4  --   GLUCOSE  --   < > 114* 110* 113*  < > = values in this interval not displayed.  Scheduled Meds: . acetaminophen  1,000 mg Oral Q6H  . enoxaparin (LOVENOX) injection  30 mg Subcutaneous Q24H  . furosemide  40 mg Oral Daily  . guaiFENesin  600 mg Oral BID  . metoprolol tartrate  12.5 mg Oral BID  . pantoprazole  40 mg Oral Daily  . potassium chloride  20 mEq Oral Daily  . simvastatin  20 mg Oral q1800  . sodium chloride  3 mL Intravenous Q12H  . sodium chloride  3 mL Intravenous Q12H  . warfarin  2 mg Oral q1800  . Warfarin - Physician Dosing Inpatient   Does not apply q1800    Continuous Infusions:    Inda Coke MS, RD, LDN Pager:  [phone] After-hours pager: [phone]

## 2014-01-17 ENCOUNTER — Encounter (HOSPITAL_COMMUNITY): Payer: Self-pay | Admitting: Thoracic Surgery (Cardiothoracic Vascular Surgery)

## 2014-01-17 LAB — PROTIME-INR
INR: 1.31 (ref 0.00–1.49)
Prothrombin Time: 16 seconds — ABNORMAL HIGH (ref 11.6–15.2)

## 2014-01-17 LAB — TYPE AND SCREEN
ABO/RH(D): A NEG
Antibody Screen: NEGATIVE
UNIT DIVISION: 0
Unit division: 0

## 2014-01-17 MED ORDER — WARFARIN SODIUM 5 MG PO TABS
5.0000 mg | ORAL_TABLET | Freq: Once | ORAL | Status: AC
  Administered 2014-01-17: 5 mg via ORAL
  Filled 2014-01-17 (×2): qty 1

## 2014-01-17 MED ORDER — WARFARIN VIDEO: Freq: Once | Status: DC

## 2014-01-17 MED ORDER — COUMADIN BOOK
Freq: Once | Status: AC
  Administered 2014-01-17: 20:00:00
  Filled 2014-01-17: qty 1

## 2014-01-17 NOTE — Progress Notes (Addendum)
      [address]       [place] [address]             [phone]        3 Days Post-Op Procedure(s) (LRB): INTRAOPERATIVE TRANSESOPHAGEAL ECHOCARDIOGRAM (N/A) TRANSCATHETER AORTIC VALVE REPLACEMENT, TRANSAORTIC (N/A) CHEST TUBE INSERTION (Left)  Subjective: Patient awakened this am. He wears depends because of incontinence (has had since prostate surgery) and was inquiring on duration of lasix.  Objective: Vital signs in last 24 hours: Temp:  [97.4 F (36.3 C)-98.3 F (36.8 C)] 98.3 F (36.8 C) (02/06 0536) Pulse Rate:  [63-85] 63 (02/06 0536) Cardiac Rhythm:  [-] Normal sinus rhythm (02/05 2058) Resp:  [18] 18 (02/06 0536) BP: (98-120)/(53-71) 116/71 mmHg (02/06 0536) SpO2:  [98 %] 98 % (02/06 0536) Weight:  [79 kg (174 lb 2.6 oz)] 79 kg (174 lb 2.6 oz) (02/06 0500)  Current Weight  01/17/14 79 kg (174 lb 2.6 oz)      Intake/Output from previous day: 02/05 0701 - 02/06 0700 In: 840 [P.O.:240] Out: 1150 [Urine:1150]   Physical Exam:  Cardiovascular: RRR, no murmurs, gallops, or rubs. Pulmonary: Clear on the right and slightly diminished at bases; no rales, wheezes, or rhonchi. Abdomen: Soft, non tender, bowel sounds present. Extremities: SCDs in place Wounds: Dressing is clean and dry.    Lab Results: CBC:  Recent Labs  01/15/14 1550 01/16/14 0509  WBC 10.9* 11.0*  HGB 10.9* 10.7*  HCT 31.5* 31.8*  PLT 237 269   BMET:   Recent Labs  01/15/14 1550 01/16/14 0509  NA 136* 137  K 4.6 4.3  CL 101 100  CO2 21 21  GLUCOSE 110* 113*  BUN 15 16  CREATININE 1.03 1.06  CALCIUM 8.9 9.3    PT/INR:  Lab Results  Component Value Date   INR 1.31 01/17/2014   INR 1.32 01/16/2014   INR 1.32 01/14/2014   ABG:  INR: Will add last result for INR, ABG once components are confirmed Will add last 4 CBG results once components are confirmed  Assessment/Plan:  1. CV - SR. On Lopressor 12.5 bid and Coumadin. INR remains 1.3 this am. Will  give higher dose of Coumadin tonight.Will not start ACE as bp, at times, labile. 2.  Pulmonary - Encourage incentive spirometer.Mucinex for cough. 3. Volume Overload - On Lasix 40 daily. Almost at pre op weight. 4.  Acute blood loss anemia - H and H stable at 10.7 and 31.8 5. PT eval completed and awaiting OT eval to help determine if inpatient rehab candidate.   ZIMMERMAN,DONIELLE MPA-C 01/17/2014,7:49 AM  I have seen and examined the patient and agree with the assessment and plan as outlined.  I am very disappointed that he was felt to be "too high level for CIR" as I think short term aggressive PT and nutritional support would have helped him significantly.  [name],CLARENCE H 01/17/2014 10:41 AM

## 2014-01-17 NOTE — Progress Notes (Signed)
Clinical Social Work Department BRIEF PSYCHOSOCIAL ASSESSMENT 01/17/2014  Patient:  Lucas Green, Lucas Green     Account Number:  [id number]     Admit date:  12/30/2013  Clinical Social Worker:  Megan Salon  Date/Time:  01/17/2014 09:13 AM  Referred by:  Care Management  Date Referred:  01/16/2014 Referred for  SNF Placement   Other Referral:   Interview type:  Patient Other interview type:    PSYCHOSOCIAL DATA Living Status:  WIFE Admitted from facility:   Level of care:   Primary support name:  Garen Grams Primary support relationship to patient:  SPOUSE Degree of support available:   Good    CURRENT CONCERNS Current Concerns  Post-Acute Placement   Other Concerns:    SOCIAL WORK ASSESSMENT / PLAN Clinical Social Worker received referral for SNF placement at d/c. CSW introduced self and explained reason for visit. CSW explained SNF process and provided SNF packet to patient. Patient reported he is agreeable for SNF placement and would prefer [facility], more specifically [facility]. CSW encouraged patient to think about additional SNF options pending availability of preferred facility. CSW will complete FL2 for MD's signature and will update patient when bed offers are received.   Assessment/plan status:  Psychosocial Support/Ongoing Assessment of Needs Other assessment/ plan:   Information/referral to community resources:   SNF packet, CSW contact information    PATIENT'S/FAMILY'S RESPONSE TO PLAN OF CARE: Patient appeared open to the SNF option and had questions about why he was not able to go to CIR. CSW explained that PT recommended SNF. Patient states he will talk to his wife about snf.       Jeanette Caprice, MSW, [place]

## 2014-01-17 NOTE — Progress Notes (Signed)
CARDIAC REHAB PHASE I   PRE:  Rate/Rhythm: 76 SR  BP:  Supine: 10/54  Sitting:   Standing:    SaO2: 97 RA  MODE:  Ambulation: 350 ft   POST:  Rate/Rhythm: 86  BP:  Supine:   Sitting: 130/60  Standing:    SaO2: 99 RA 1320-1405 Assisted X 1 and used walker to ambulate. Gait steady with walker. Pt c/o of weakness. He was able to walk 350 feet with one standing rest stop. VS stable. Pt to recliner after walk with call light in reach and wife present.  Rodney Langton RN 01/17/2014 2:04 PM

## 2014-01-17 NOTE — Progress Notes (Signed)
Thank you for consult on Mr. Lucas Green. He's doing well with therapy and is min/guard assist overall. He's too high level for CIR. SNF recommended if no family support available. Will defer CIR consult.

## 2014-01-17 NOTE — Progress Notes (Signed)
Physical Therapy Treatment Patient Details Name: PAU BANH MRN: [id number] DOB: 11-Aug-1932 Today's Date: 01/17/2014 Time: 1740-1759 PT Time Calculation (min): 19 min  PT Assessment / Plan / Recommendation  History of Present Illness 80 yo male admitted with weakness, pleural effusion, FTT. Hx of HTN, CVA, arthritis, prostate cancer with mets, GERD.  Work up for TAVR.     PT Comments   Progressing slowly.  Working on cadence changes which harder for him and postural checks.  Pt more tired today and showed noticeable fatigue.   Follow Up Recommendations  SNF     Does the patient have the potential to tolerate intense rehabilitation     Barriers to Discharge        Equipment Recommendations  None recommended by PT    Recommendations for Other Services    Frequency Min 3X/week   Progress towards PT Goals Progress towards PT goals: Progressing toward goals  Plan Current plan remains appropriate    Precautions / Restrictions Precautions Precautions: Fall Restrictions Weight Bearing Restrictions: No   Pertinent Vitals/Pain     Mobility  Bed Mobility Overal bed mobility: Needs Assistance Bed Mobility: Supine to Sit Sidelying to sit: Supervision General bed mobility comments: increased time Transfers Overall transfer level: Needs assistance Transfers: Sit to/from Stand Sit to Stand: Min guard General transfer comment: cues for safety Ambulation/Gait Ambulation/Gait assistance: Min guard Ambulation Distance (Feet): 160 Feet Assistive device: Rolling walker (2 wheeled) Gait Pattern/deviations: Step-through pattern;Decreased step length - right;Decreased step length - left;Decreased stride length Gait velocity: decreased    Exercises     PT Diagnosis:    PT Problem List:   PT Treatment Interventions:     PT Goals (current goals can now be found in the care plan section) Acute Rehab PT Goals PT Goal Formulation: With patient/family Time For Goal Achievement:  01/23/14 Potential to Achieve Goals: Good  Visit Information  Last PT Received On: 01/17/14 Assistance Needed: +1 History of Present Illness: 80 yo male admitted with weakness, pleural effusion, FTT. Hx of HTN, CVA, arthritis, prostate cancer with mets, GERD.  Work up for TAVR.      Subjective Data  Subjective: I'm not going to be able to get up if you don't help   Cognition  Cognition Arousal/Alertness: Awake/alert Behavior During Therapy: WFL for tasks assessed/performed Overall Cognitive Status: Within Functional Limits for tasks assessed    Balance  Balance Overall balance assessment: Needs assistance Standing balance-Leahy Scale: Good  End of Session PT - End of Session Activity Tolerance: Patient tolerated treatment well Patient left: in chair;with call bell/phone within reach Nurse Communication: Mobility status   GP     Vincent Streater, Tessie Fass 01/17/2014, 6:04 PM 01/17/2014  Donnella Sham, [place] [phone]  (pager)

## 2014-01-17 NOTE — Discharge Instructions (Signed)
Activity: 1.May walk up steps                2.No lifting more than ten pounds for two weeks.                 3.No driving for two weeks.                4.Stop any activity that causes chest pain, shortness of breath, dizziness,  sweating or excessive weakness.                5.Avoid straining.                6.Continue with your breathing exercises daily.  Diet: Low fat, Low saltdiet  Wound Care: May shower.  Clean wounds with mild soap and water daily. Contact the office at [phone] if any problems arise.   Information on my medicine - Coumadin   (Warfarin)  This medication education was reviewed with me or my healthcare representative as part of my discharge preparation.  The pharmacist that spoke with me during my hospital stay was:  Earleen Newport, [facility]  Why was Coumadin prescribed for you? Coumadin was prescribed for you because you have a blood clot or a medical condition that can cause an increased risk of forming blood clots. Blood clots can cause serious health problems by blocking the flow of blood to the heart, lung, or brain. Coumadin can prevent harmful blood clots from forming. As a reminder your indication for Coumadin is:   Blood Clot Prevention After Heart Valve Surgery  What test will check on my response to Coumadin? While on Coumadin (warfarin) you will need to have an INR test regularly to ensure that your dose is keeping you in the desired range. The INR (international normalized ratio) number is calculated from the result of the laboratory test called prothrombin time (PT).  If an INR APPOINTMENT HAS NOT ALREADY BEEN MADE FOR YOU please schedule an appointment to have this lab work done by your health care provider within 7 days. Your INR goal is usually a number between:  2 to 3 or your provider may give you a more narrow range like 2-2.5.  Ask your health care provider during an office visit what your goal INR is.  What  do you need to  know  About   COUMADIN? Take Coumadin (warfarin) exactly as prescribed by your healthcare provider about the same time each day.  DO NOT stop taking without talking to the doctor who prescribed the medication.  Stopping without other blood clot prevention medication to take the place of Coumadin may increase your risk of developing a new clot or stroke.  Get refills before you run out.  What do you do if you miss a dose? If you miss a dose, take it as soon as you remember on the same day then continue your regularly scheduled regimen the next day.  Do not take two doses of Coumadin at the same time.  Important Safety Information A possible side effect of Coumadin (Warfarin) is an increased risk of bleeding. You should call your healthcare provider right away if you experience any of the following:   Bleeding from an injury or your nose that does not stop.   Unusual colored urine (red or dark brown) or unusual colored stools (red or black).   Unusual bruising for unknown reasons.   A serious fall or if you hit your head (even if there is no  bleeding).  Some foods or medicines interact with Coumadin (warfarin) and might alter your response to warfarin. To help avoid this:   Eat a balanced diet, maintaining a consistent amount of Vitamin K.   Notify your provider about major diet changes you plan to make.   Avoid alcohol or limit your intake to 1 drink for women and 2 drinks for men per day. (1 drink is 5 oz. wine, 12 oz. beer, or 1.5 oz. liquor.)  Make sure that ANY health care provider who prescribes medication for you knows that you are taking Coumadin (warfarin).  Also make sure the healthcare provider who is monitoring your Coumadin knows when you have started a new medication including herbals and non-prescription products.  Coumadin (Warfarin)  Major Drug Interactions  Increased Warfarin Effect Decreased Warfarin Effect  Alcohol (large quantities) Antibiotics (esp. Septra/Bactrim, Flagyl,  Cipro) Amiodarone (Cordarone) Aspirin (ASA) Cimetidine (Tagamet) Megestrol (Megace) NSAIDs (ibuprofen, naproxen, etc.) Piroxicam (Feldene) Propafenone (Rythmol SR) Propranolol (Inderal) Isoniazid (INH) Posaconazole (Noxafil) Barbiturates (Phenobarbital) Carbamazepine (Tegretol) Chlordiazepoxide (Librium) Cholestyramine (Questran) Griseofulvin Oral Contraceptives Rifampin Sucralfate (Carafate) Vitamin K   Coumadin (Warfarin) Major Herbal Interactions  Increased Warfarin Effect Decreased Warfarin Effect  Garlic Ginseng Ginkgo biloba Coenzyme Q10 Green tea St. Johns wort    Coumadin (Warfarin) FOOD Interactions  Eat a consistent number of servings per week of foods HIGH in Vitamin K (1 serving =  cup)  Collards (cooked, or boiled & drained) Kale (cooked, or boiled & drained) Mustard greens (cooked, or boiled & drained) Parsley *serving size only =  cup Spinach (cooked, or boiled & drained) Swiss chard (cooked, or boiled & drained) Turnip greens (cooked, or boiled & drained)  Eat a consistent number of servings per week of foods MEDIUM-HIGH in Vitamin K (1 serving = 1 cup)  Asparagus (cooked, or boiled & drained) Broccoli (cooked, boiled & drained, or raw & chopped) Brussel sprouts (cooked, or boiled & drained) *serving size only =  cup Lettuce, raw (green leaf, endive, romaine) Spinach, raw Turnip greens, raw & chopped   These websites have more information on Coumadin (warfarin):  FailFactory.se; VeganReport.com.au;

## 2014-01-17 NOTE — Progress Notes (Addendum)
Clinical Social Work Department CLINICAL SOCIAL WORK PLACEMENT NOTE 01/17/2014  Patient:  SAMEER, TEEPLE  Account Number:  [id number] Admit date:  12/30/2013  Clinical Social Worker:  Megan Salon  Date/time:  01/17/2014 09:17 AM  Clinical Social Work is seeking post-discharge placement for this patient at the following level of care:   [place]   (*CSW will update this form in Epic as items are completed)   01/17/2014  Patient/family provided with [place] Department of Clinical Social Work's list of facilities offering this level of care within the geographic area requested by the patient (or if unable, by the patient's family).  01/17/2014  Patient/family informed of their freedom to choose among providers that offer the needed level of care, that participate in Medicare, Medicaid or managed care program needed by the patient, have an available bed and are willing to accept the patient.  01/17/2014  Patient/family informed of [facility]' ownership interest in [facility], as well as of the fact that they are under no obligation to receive care at this facility.  PASARR submitted to EDS on 01/17/2014 PASARR number received from EDS on 01/17/2014  FL2 transmitted to all facilities in geographic area requested by pt/family on  01/17/2014 FL2 transmitted to all facilities within larger geographic area on   Patient informed that his/her managed care company has contracts with or will negotiate with  certain facilities, including the following:     Patient/family informed of bed offers received:  01/18/2014 Patient chooses bed at  Physician recommends and patient chooses bed at    Patient to be transferred to  on   Patient to be transferred to facility by   The following physician request were entered in Epic:   Additional Comments:  Jeanette Caprice, MSW, [place]

## 2014-01-17 NOTE — Progress Notes (Signed)
Patient ID: Lucas Green, male   DOB: 04/21/1932, 80 y.o.   MRN: [id number]    Subjective:  Doing well some incontinence form prev. Prostate issue   Objective:  Vital Signs in the last 24 hours: Temp:  [97.4 F (36.3 C)-98.3 F (36.8 C)] 98.3 F (36.8 C) (02/06 0536) Pulse Rate:  [63-85] 63 (02/06 0536) Resp:  [18] 18 (02/06 0536) BP: (98-120)/(53-71) 116/71 mmHg (02/06 0536) SpO2:  [98 %] 98 % (02/06 0536) Weight:  [174 lb 2.6 oz (79 kg)] 174 lb 2.6 oz (79 kg) (02/06 0500)  Intake/Output from previous day: 02/05 0701 - 02/06 0700 In: 1080 [P.O.:480] Out: 1150 [Urine:1150]  Physical Exam: Pt is alert and oriented, pleasant elderly male in NAD HEENT: normal Neck: JVP - normal, carotids 2+=  Lungs: CTA bilaterally CV: RRR without murmur or gallop  No AR  Abd: soft, NT Ext: no C/C/E, distal pulses intact and equal Skin: warm/dry no rash   Lab Results:  Recent Labs  01/15/14 1550 01/16/14 0509  WBC 10.9* 11.0*  HGB 10.9* 10.7*  PLT 237 269    Recent Labs  01/15/14 1550 01/16/14 0509  NA 136* 137  K 4.6 4.3  CL 101 100  CO2 21 21  GLUCOSE 110* 113*  BUN 15 16  CREATININE 1.03 1.06   No results found for this basename: TROPONINI, CK, MB,  in the last 72 hours  Cardiac Studies: 2D Echo (post-op): Study Conclusions  - Left ventricle: The cavity size was mildly dilated. Wall thickness was increased in a pattern of mild LVH. Systolic function was mildly reduced. The estimated ejection fraction was 45%, in the range of 45% to 50%. Diffuse hypokinesis. Regional wall motion abnormalities cannot be excluded. - Aortic valve: A bioprosthesis was present. - Mitral valve: Mild regurgitation. - Left atrium: The atrium was mildly dilated. Impressions:  - Technically difficult; Limited study to assess AVR post TAVR; full doppler study not performed. LV function appears to be mildly reduced; well seated prosthetic aortic valve with normal mean gradient and  no AI.  Tele: Sinus rhythm, personally reviewed  Assessment/Plan:  1. Severe AS s/p TAVR (POD # 3) 2. 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 PAF 4. Generalized weakness 5. Metastatic prostate CA  Almost at preop weight  Post op echo with good valve placement and no perivalvular leak Will likely need SNF on d/c Hct stable   Jenkins Rouge, M.D. 01/17/2014, 10:07 AM

## 2014-01-18 LAB — PROTIME-INR
INR: 1.25 (ref 0.00–1.49)
PROTHROMBIN TIME: 15.4 s — AB (ref 11.6–15.2)

## 2014-01-18 MED ORDER — WARFARIN SODIUM 5 MG PO TABS
5.0000 mg | ORAL_TABLET | Freq: Once | ORAL | Status: AC
  Administered 2014-01-18: 5 mg via ORAL
  Filled 2014-01-18: qty 1

## 2014-01-18 NOTE — Progress Notes (Signed)
CARDIAC REHAB PHASE I   PRE:  Rate/Rhythm: Sinus 71  BP:    Sitting: 108/60     SaO2: 96% room air  MODE:  Ambulation: 250 ft   POST:  Rate/Rhythem: Sinus 75  BP:    Sitting: 108/60   SaO2: 99% room air  1250-1340  Patient ambulated in hallway with rolling walker. Stopped time one to rest. Patient assisted back to recliner with call bell within reach. Reviewed sternal precautions, exercise instructions as pt can tolerate and low sodium diet information. Lucas Green says he is interested in outpatient cardiac rehab in [place]. Discharge OHS video set up for Lucas Green and his wife.   Lucas Green, [name] RN BSN

## 2014-01-18 NOTE — Progress Notes (Addendum)
       [address]       [place] [address]             [phone]          4 Days Post-Op Procedure(s) (LRB): INTRAOPERATIVE TRANSESOPHAGEAL ECHOCARDIOGRAM (N/A) TRANSCATHETER AORTIC VALVE REPLACEMENT, TRANSAORTIC (N/A) CHEST TUBE INSERTION (Left)  Subjective: Nauseated and vomiting this am since eating breakfast.  States he has a "full" feeling in his stomach which has been present since prior to surgery.  +BMs. Feels weaker today.   Objective: Vital signs in last 24 hours: Patient Vitals for the past 24 hrs:  BP Temp Temp src Pulse Resp SpO2 Weight  01/18/14 0517 118/71 mmHg 97.8 F (36.6 C) Oral 74 18 96 % 175 lb 0.7 oz (79.4 kg)  01/17/14 2223 114/54 mmHg - - 84 - - -  01/17/14 1940 114/71 mmHg 97.4 F (36.3 C) Oral 83 18 98 % -  01/17/14 1402 110/54 mmHg 98.5 F (36.9 C) Oral 76 18 98 % -   Current Weight  01/18/14 175 lb 0.7 oz (79.4 kg)     Intake/Output from previous day: 02/06 0701 - 02/07 0700 In: 240 [P.O.:240] Out: 1875 [Urine:1875]    PHYSICAL EXAM:  Heart: RRR Lungs: Coarse rhonchi bilaterally Wound: Clean and dry Extremities: Trace LE edema Abdomen: Soft, NT/ND, +BS    Lab Results: CBC: Recent Labs  01/15/14 1550 01/16/14 0509  WBC 10.9* 11.0*  HGB 10.9* 10.7*  HCT 31.5* 31.8*  PLT 237 269   BMET:  Recent Labs  01/15/14 1550 01/16/14 0509  NA 136* 137  K 4.6 4.3  CL 101 100  CO2 21 21  GLUCOSE 110* 113*  BUN 15 16  CREATININE 1.03 1.06  CALCIUM 8.9 9.3    PT/INR:  Recent Labs  01/18/14 0436  LABPROT 15.4*  INR 1.25      Assessment/Plan: S/P Procedure(s) (LRB): INTRAOPERATIVE TRANSESOPHAGEAL ECHOCARDIOGRAM (N/A) TRANSCATHETER AORTIC VALVE REPLACEMENT, TRANSAORTIC (N/A) CHEST TUBE INSERTION (Left)  CV- Maintaining SR, BPs stable.  Continue Lopressor.  INR decreased today- will repeat 5 mg tonight and watch closely.  Vol overload- continue diuresis.  GI- nausea this am- will tx  conservatively.  Pt states he has had these issues along with early satiety since prior to surgery, not on any GI meds.  Monitor.  Continue PT/CRPI.  Disp- to SNF first of the week if he progresses.    LOS: 19 days    COLLINS,GINA H 01/18/2014  I have seen and examined the patient and agree with the assessment and plan as outlined.  Some nausea today.  Will hold lasix and potassium - dry weight unclear.  He no longer appears volume overloaded.  Check f/u labs in am.    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  I remain very disappointed.  He is motivated, intelligent, able to participate in aggressive therapy, and he still needs a great deal of help.  Stefano Trulson H 01/18/2014 11:48 AM

## 2014-01-18 NOTE — Progress Notes (Signed)
Weekend CSW provided bed offers to patient. He reviewed offers, and stated his preference for [facility] if CIR is not possible. Patient agreed to review offers with his wife; patient has no questions at this time. CSW encouraged patient to request CSW assistance if further questions arose.   Tilden Fossa, MSW, [place] Clinical Social Worker [facility] Emergency Dept. [phone]

## 2014-01-18 NOTE — Progress Notes (Signed)
Pt refused to walk at this time due to nausea. Will continue to monitor.

## 2014-01-19 ENCOUNTER — Inpatient Hospital Stay (HOSPITAL_COMMUNITY): Payer: Medicare Other

## 2014-01-19 LAB — COMPREHENSIVE METABOLIC PANEL
ALBUMIN: 2.7 g/dL — AB (ref 3.5–5.2)
ALT: 51 U/L (ref 0–53)
AST: 64 U/L — ABNORMAL HIGH (ref 0–37)
Alkaline Phosphatase: 284 U/L — ABNORMAL HIGH (ref 39–117)
BILIRUBIN TOTAL: 0.3 mg/dL (ref 0.3–1.2)
BUN: 30 mg/dL — AB (ref 6–23)
CO2: 23 meq/L (ref 19–32)
CREATININE: 1.13 mg/dL (ref 0.50–1.35)
Calcium: 8.9 mg/dL (ref 8.4–10.5)
Chloride: 97 mEq/L (ref 96–112)
GFR calc non Af Amer: 60 mL/min — ABNORMAL LOW (ref >90)
GFR, EST AFRICAN AMERICAN: 69 mL/min — AB (ref >90)
GLUCOSE: 101 mg/dL — AB (ref 70–99)
Potassium: 4.5 mEq/L (ref 3.7–5.3)
Sodium: 135 mEq/L — ABNORMAL LOW (ref 137–147)
Total Protein: 6.3 g/dL (ref 6.0–8.3)

## 2014-01-19 LAB — CBC
HEMATOCRIT: 34.9 % — AB (ref 39.0–52.0)
Hemoglobin: 12 g/dL — ABNORMAL LOW (ref 13.0–17.0)
MCH: 32.5 pg (ref 26.0–34.0)
MCHC: 34.4 g/dL (ref 30.0–36.0)
MCV: 94.6 fL (ref 78.0–100.0)
Platelets: 403 10*3/uL — ABNORMAL HIGH (ref 150–400)
RBC: 3.69 MIL/uL — ABNORMAL LOW (ref 4.22–5.81)
RDW: 13.3 % (ref 11.5–15.5)
WBC: 8 10*3/uL (ref 4.0–10.5)

## 2014-01-19 LAB — PROTIME-INR
INR: 1.41 (ref 0.00–1.49)
Prothrombin Time: 16.9 seconds — ABNORMAL HIGH (ref 11.6–15.2)

## 2014-01-19 LAB — PRO B NATRIURETIC PEPTIDE: PRO B NATRI PEPTIDE: 2105 pg/mL — AB (ref 0–450)

## 2014-01-19 MED ORDER — WARFARIN SODIUM 5 MG PO TABS
5.0000 mg | ORAL_TABLET | Freq: Once | ORAL | Status: AC
  Administered 2014-01-19: 5 mg via ORAL
  Filled 2014-01-19: qty 1

## 2014-01-19 NOTE — Progress Notes (Addendum)
       [address]       [place] [address]             [phone]          5 Days Post-Op Procedure(s) (LRB): INTRAOPERATIVE TRANSESOPHAGEAL ECHOCARDIOGRAM (N/A) TRANSCATHETER AORTIC VALVE REPLACEMENT, TRANSAORTIC (N/A) CHEST TUBE INSERTION (Left)  Subjective: Feels better this am.  Still with mild nausea after eating, but no more vomiting.   Objective: Vital signs in last 24 hours: Patient Vitals for the past 24 hrs:  BP Temp Temp src Pulse Resp SpO2 Weight  01/19/14 0459 114/63 mmHg 96.9 F (36.1 C) Oral 67 18 96 % 174 lb 2.6 oz (79 kg)  01/18/14 1949 110/66 mmHg 97.7 F (36.5 C) Oral 79 18 96 % -  01/18/14 1423 99/61 mmHg 97.7 F (36.5 C) Axillary 79 18 97 % -  01/18/14 1022 116/58 mmHg - - 78 - - -   Current Weight  01/19/14 174 lb 2.6 oz (79 kg)     Intake/Output from previous day: 02/07 0701 - 02/08 0700 In: 600 [P.O.:600] Out: 995 [Urine:995]    PHYSICAL EXAM:  Heart: RRR Lungs: Few coarse BS that clear with cough Wound: Clean and dry Extremities: No significant LE edema    Lab Results: CBC: Recent Labs  01/19/14 0400  WBC 8.0  HGB 12.0*  HCT 34.9*  PLT 403*   BMET:  Recent Labs  01/19/14 0400  NA 135*  K 4.5  CL 97  CO2 23  GLUCOSE 101*  BUN 30*  CREATININE 1.13  CALCIUM 8.9    PT/INR:  Recent Labs  01/19/14 0400  LABPROT 16.9*  INR 1.41    CXR: stable atelectasis, no effusions   Assessment/Plan: S/P Procedure(s) (LRB): INTRAOPERATIVE TRANSESOPHAGEAL ECHOCARDIOGRAM (N/A) TRANSCATHETER AORTIC VALVE REPLACEMENT, TRANSAORTIC (N/A) CHEST TUBE INSERTION (Left) CV- Maintaining SR, BPs stable. Continue Lopressor.Will give another 5 mg Coumadin tonight. GI- nausea improving. Continue to watch. Deconditioning- Continue PT/CRPI.  Disp- CIR reconsulted to evaluate for admission.  Hopefully CIR vs SNF in next day or 2 if he remains stable.   LOS: 20 days    COLLINS,GINA H 01/19/2014  I have seen and  examined the patient and agree with the assessment and plan as outlined.  D/C to [facility] SNF tomorrow unless PMR team accepts him in transfer to CIR.  Discrepancy in assessments by physical therapy between the pre and post op evaluations are difficult to reconcile.  Atlas Kuc H 01/19/2014 11:45 AM

## 2014-01-19 NOTE — Progress Notes (Signed)
Occupational Therapy Treatment Patient Details Name: Lucas Green MRN: [id number] DOB: October 28, 1932 Today's Date: 01/19/2014 Time: 5188-4166 OT Time Calculation (min): 20 min  OT Assessment / Plan / Recommendation  History of present illness 80 yo male admitted with weakness, pleural effusion, FTT. Hx of HTN, CVA, arthritis, prostate cancer with mets, GERD.  Work up for TAVR.     OT comments  Pt. Tolerated standing grooming tasks with no LOB or reports of SOB or dizziness.  rn states conflicting d/c recommendations pre and post op evals.  md looking for clarification and still wanting pt. To have CIR level therapies vs. Snf.   PT notified of the issue and will see the pt. Today to determine appropriate dispo. For pt.  Follow Up Recommendations  CIR           Equipment Recommendations  3 in 1 bedside comode        Frequency Min 2X/week   Progress towards OT Goals Progress towards OT goals: Progressing toward goals  Plan Discharge plan needs to be updated;Other (comment) (conflicting d/c plans pre/post op md requesting clarification)    Precautions / Restrictions Precautions Precautions: Fall   Pertinent Vitals/Pain 0/10 per pt.    ADL  Grooming: Performed;Wash/dry hands;Teeth care;Min guard Where Assessed - Grooming: Unsupported standing Transfers/Ambulation Related to ADLs: cues for scooting eob for easier sit/stand.  no reports of dizziness during any portion of functional mobility today. ADL Comments: completed grooming task in standing with no issues noted.  wife present and both report pt. completed sponge bath earlier this a.m. and stood for portions of it at the sink with no issues.    OT Goals(current goals can now be found in the care plan section)    Visit Information  Last OT Received On: 01/19/14 History of Present Illness: 80 yo male admitted with weakness, pleural effusion, FTT. Hx of HTN, CVA, arthritis, prostate cancer with mets, GERD.  Work up for TAVR.                   Cognition  Cognition Arousal/Alertness: Awake/alert Behavior During Therapy: WFL for tasks assessed/performed Overall Cognitive Status: Within Functional Limits for tasks assessed    Mobility  Bed Mobility Overal bed mobility: Needs Assistance Bed Mobility: Rolling;Sidelying to Sit;Sit to Supine;Sit to Sidelying Rolling: Supervision;Min guard Sidelying to sit: Supervision;Min guard Supine to sit: Supervision;Min guard Sit to supine: Supervision;Min guard Sit to sidelying: Supervision;Min guard Transfers Overall transfer level: Needs assistance Equipment used: Rolling walker (2 wheeled) Transfers: Sit to/from Stand Sit to Stand: Min guard Stand pivot transfers: Min guard              End of Session OT - End of Session Equipment Utilized During Treatment: Rolling walker Activity Tolerance: Patient tolerated treatment well Patient left: in bed;with call bell/phone within reach Nurse Communication: Other (comment) (rn communicated conflicting d/c issues per md)       Climmie Cronce, Rico Junker, COTA/L 01/19/2014, 1:31 PM

## 2014-01-19 NOTE — Progress Notes (Signed)
Physical Therapy Treatment Patient Details Name: Lucas Green MRN: [id number] DOB: 1932/02/20 Today's Date: 01/19/2014 Time: 1421-1510 PT Time Calculation (min): 49 min  PT Assessment / Plan / Recommendation  History of Present Illness 80 yo male admitted with weakness, pleural effusion, FTT. Hx of HTN, CVA, arthritis, prostate cancer with mets, GERD.  Work up for TAVR.     PT Comments   Asked by COTA/RN to reassess pt for discharge recommendations.    Chart reviewed for performance/mobility data for pre/post comparison with the findings (listed below).  Pt has demonstrated improvement in cardiopulmonary function represented by increased distance walked in 6 minutes, but remains far below age expected norms. Pt demonstrates no significant difference in normal gait speed and remains far below normal gait speed, reflecting poor mobility and predictive of  morbidity/mortality and high fall risk.  Pt demonstrates significantly impaired lower extremity strength represented by 30 second chair rise, and is far below age expected norms.  Pt demonstrates significantly limited static and dynamic balance measures, and is predicted to be at high fall risk.   Pt is highly motivated, demonstrated ability to sustain performance over long session with minimal rest breaks, and is significantly functionally impaired.  Based on these findings, pt clearly needs continued therapy in a postacute environment.  I would recommend inpatient rehab reconsult to re-examine case and consider data gathered this session.  I believe pt appears clinical appropriate for the intensity of inpt rehab setting.  PT will continue to follow acutely until disposition can be resolved.   6 Minute Walk Test  Pre (01/10/14) = 122 meters    Post (01/18/14) = 216 meters     Norms for men age 38 = 530 meters  Normal Gait Speed Pre (01/10/14) = 0.46 m/sec     Post (01/18/14) = 0.33 m/sec    Norm = 1.0 m/sec  30 second chair rise Pre (not  assessed)                   Post (01/18/14) = 3 reptitions          Norm for men aged 56 = >11 repetitons  4 Stage Balance Test Pre (not assessed)    Post: single leg stance 5 sec max; tandem stance 15 second max; Rhomberg eyes open 30 seconds.   Follow Up Recommendations  CIR (see pre/post test data and age-related norm comparisons)     Does the patient have the potential to tolerate intense rehabilitation     Barriers to Discharge        Equipment Recommendations       Recommendations for Other Services Rehab consult  Frequency Min 3X/week   Progress towards PT Goals Progress towards PT goals: Progressing toward goals  Plan Discharge plan needs to be updated    Precautions / Restrictions Precautions Precautions: Fall Precaution Comments: see balance scores; depend on RW   Pertinent Vitals/Pain HR 96 bpm on 6MWT, returned to 78 bpm after 3-5 min rest    Mobility  Bed Mobility Overal bed mobility: Needs Assistance Bed Mobility: Supine to Sit;Sit to Supine Rolling: Supervision;Min guard Sidelying to sit: Supervision;Min guard Supine to sit: Supervision Sit to supine: Supervision Sit to sidelying: Supervision;Min guard General bed mobility comments: needs increased time and physical assist to reposition once returned to supine Transfers Overall transfer level: Needs assistance Equipment used: Rolling walker (2 wheeled) Transfers: Sit to/from Stand Sit to Stand: Min guard Stand pivot transfers: Min guard General transfer comment: standby or  hands on supervision for postural instability upon standing, improves to close supervision over time/repeated trials Ambulation/Gait Ambulation/Gait assistance: Supervision Ambulation Distance (Feet): 710 Feet (Six minute walk test distance (see results/comments)) Assistive device: Rolling walker (2 wheeled) Gait Pattern/deviations: Step-through pattern;Trunk flexed;Narrow base of support;Decreased stride length Gait velocity: 17  feet/15.4 sec = 1.06 ft/sec (0.33 meter/sec) Gait velocity interpretation: Below normal speed for age/gender General Gait Details: assessed for cardiopulmonary performance along with 6MWT    Exercises     PT Diagnosis:    PT Problem List:   PT Treatment Interventions:     PT Goals (current goals can now be found in the care plan section)    Visit Information  Last PT Received On: 01/19/14 Assistance Needed: +1 History of Present Illness: 80 yo male admitted with weakness, pleural effusion, FTT. Hx of HTN, CVA, arthritis, prostate cancer with mets, GERD.  Work up for TAVR.      Subjective Data  Subjective: Still feeling so weak!   Cognition  Cognition Arousal/Alertness: Awake/alert Behavior During Therapy: WFL for tasks assessed/performed Overall Cognitive Status: Within Functional Limits for tasks assessed    Balance  Balance Overall balance assessment: Needs assistance Sitting balance-Leahy Scale: Good Standing balance support: Bilateral upper extremity supported;No upper extremity supported Standing balance-Leahy Scale: Fair Standing balance comment: flexed trunk + leg weakness; static > dynamic in normal stance; dependent on external support for stability to prevent loss of balance to perform functional mobility Single Leg Stance - Right Leg: 0 Single Leg Stance - Left Leg: 5 Tandem Stance - Right Leg: 15 Tandem Stance - Left Leg: 0 Rhomberg - Eyes Opened: 30 High Level Balance Comments: single leg stance time <10 seconds is predictive of fall  End of Session PT - End of Session Activity Tolerance: Patient tolerated treatment well Patient left: in bed;with call bell/phone within reach Nurse Communication: Mobility status   GP     [name] 01/19/2014, 3:41 PM

## 2014-01-20 LAB — PROTIME-INR
INR: 1.8 — ABNORMAL HIGH (ref 0.00–1.49)
PROTHROMBIN TIME: 20.4 s — AB (ref 11.6–15.2)

## 2014-01-20 MED ORDER — TRAMADOL HCL 50 MG PO TABS: 50.0000 mg | ORAL_TABLET | Freq: Four times a day (QID) | ORAL | Status: DC | PRN

## 2014-01-20 MED ORDER — WARFARIN SODIUM 2.5 MG PO TABS: 2.5000 mg | ORAL_TABLET | Freq: Every day | ORAL | Status: DC

## 2014-01-20 MED ORDER — BOOST / RESOURCE BREEZE PO LIQD: 1.0000 | Freq: Two times a day (BID) | ORAL | Status: DC

## 2014-01-20 MED ORDER — POLYVINYL ALCOHOL 1.4 % OP SOLN: 1.0000 [drp] | OPHTHALMIC | Status: DC | PRN

## 2014-01-20 MED ORDER — METOPROLOL TARTRATE 12.5 MG HALF TABLET: 12.5000 mg | ORAL_TABLET | Freq: Two times a day (BID) | ORAL | Status: DC

## 2014-01-20 MED ORDER — ASPIRIN 81 MG PO CHEW: 81.0000 mg | CHEWABLE_TABLET | Freq: Every day | ORAL | Status: AC

## 2014-01-20 MED ORDER — ONDANSETRON HCL 4 MG/2ML IJ SOLN: 4.0000 mg | Freq: Four times a day (QID) | INTRAMUSCULAR | Status: DC | PRN

## 2014-01-20 NOTE — Progress Notes (Signed)
Report called to [facility]; pt to be transported by wife; will cont. To monitor.

## 2014-01-20 NOTE — Progress Notes (Signed)
Clinical Social Work Department CLINICAL SOCIAL WORK PLACEMENT NOTE 01/20/2014  Patient:  BOAZ, BERISHA  Account Number:  [id number] Admit date:  12/30/2013  Clinical Social Worker:  Megan Salon  Date/time:  01/17/2014 09:17 AM  Clinical Social Work is seeking post-discharge placement for this patient at the following level of care:   Post   (*CSW will update this form in Epic as items are completed)   01/17/2014  Patient/family provided with [place] Department of Clinical Social Work's list of facilities offering this level of care within the geographic area requested by the patient (or if unable, by the patient's family).  01/17/2014  Patient/family informed of their freedom to choose among providers that offer the needed level of care, that participate in Medicare, Medicaid or managed care program needed by the patient, have an available bed and are willing to accept the patient.  01/17/2014  Patient/family informed of [facility]' ownership interest in [facility], as well as of the fact that they are under no obligation to receive care at this facility.  PASARR submitted to EDS on 01/17/2014 PASARR number received from EDS on 01/17/2014  FL2 transmitted to all facilities in geographic area requested by pt/family on  01/17/2014 FL2 transmitted to all facilities within larger geographic area on   Patient informed that his/her managed care company has contracts with or will negotiate with  certain facilities, including the following:     Patient/family informed of bed offers received:  01/18/2014 Patient chooses bed at [facility] Physician recommends and patient chooses bed at    Patient to be transferred to OTHER on  01/20/2014 Patient to be transferred to facility by [facility]  The following physician request were entered in Epic:   Additional Comments:

## 2014-01-20 NOTE — Progress Notes (Signed)
Physical Therapy Treatment Patient Details Name: Lucas Green MRN: [id number] DOB: [birth year] Today's Date: 01/20/2014 Time: 1017-5102 PT Time Calculation (min): 40 min  PT Assessment / Plan / Recommendation  History of Present Illness 80 yo male admitted with weakness, pleural effusion, FTT. Hx of HTN, CVA, arthritis, prostate cancer with mets, GERD.  Work up for TAVR.     PT Comments   Pt disappointed that CIR is not an option due to being too high level. Agree he is not ready to be home alone while wife works and he insists she will not be able to stay at home with him. He agreed to short-term rehab at a SNF, however wanted to speak to someone from CIR and their admissions coordinator notified. Continue PT to address safety with mobility.   Follow Up Recommendations  SNF;Supervision for mobility/OOB     Does the patient have the potential to tolerate intense rehabilitation     Barriers to Discharge        Equipment Recommendations  None recommended by PT    Recommendations for Other Services    Frequency Min 3X/week   Progress towards PT Goals Progress towards PT goals: Progressing toward goals  Plan Discharge plan needs to be updated    Precautions / Restrictions Precautions Precautions: Fall Restrictions Weight Bearing Restrictions: No   Pertinent Vitals/Pain SaO2 97 % on RA HR 104 with activity    Mobility  Bed Mobility Overal bed mobility: Needs Assistance Bed Mobility: Rolling;Sidelying to Sit Rolling: Modified independent (Device/Increase time) Sidelying to sit: Supervision General bed mobility comments: HOB flat and no rail; pt able to perform with incr time and effort Transfers Overall transfer level: Needs assistance Equipment used: Rolling walker (2 wheeled) Transfers: Sit to/from Stand Sit to Stand: Min guard;Mod assist General transfer comment: minguard from bed elevated to simulate height of bed at home and from chair with armrests and use of bil  UEs; practiced sit to stand for strengthening from standard height chair with hands on his knees and required up to mod assist Ambulation/Gait Ambulation/Gait assistance: Supervision Ambulation Distance (Feet): 350 Feet Assistive device: Rolling walker (2 wheeled) Gait Pattern/deviations: Step-through pattern;Trunk flexed General Gait Details: Pt tends to push the RW too far ahead with incr weight-bearing through UEs; educated on proper use of RW (upright posture, RW close to body and hands under shoulders for support if legs "give out")    Exercises Other Exercises Other Exercises: sit to stand from standard height chair x 5 reps. Pt required increased rest time between attempts and incr assist without use of armrests.   PT Diagnosis:    PT Problem List:   PT Treatment Interventions:     PT Goals (current goals can now be found in the care plan section)    Visit Information  Last PT Received On: 01/20/14 Assistance Needed: +1 History of Present Illness: 80 yo male admitted with weakness, pleural effusion, FTT. Hx of HTN, CVA, arthritis, prostate cancer with mets, GERD.  Work up for TAVR.      Subjective Data      Cognition  Cognition Arousal/Alertness: Awake/alert Behavior During Therapy: WFL for tasks assessed/performed Overall Cognitive Status: Within Functional Limits for tasks assessed    Balance  General Comments General comments (skin integrity, edema, etc.): Noted prior CIR consult deferred due to patient too high-level. Spoke to patient (and then again when wife arrived) re: discharge plan. Patient initially expressed understanding of recommendation for SNF for continued therapies. When wife arrived,  she (and then pt) again requested to speak with someone from CIR re: possible admission. Placed pre-screening order and spoke with Karene Fry, RN Case Manager.   End of Session PT - End of Session Equipment Utilized During Treatment: Gait belt Activity Tolerance: Patient  limited by fatigue Patient left: with call bell/phone within reach;in chair;with family/visitor present   GP     Binnie Vonderhaar 01/20/2014, 11:29 AM Pager [phone]

## 2014-01-20 NOTE — Progress Notes (Signed)
Attempted to call report to [facility]; RN unable to take report at this time; will attempt to call at a later time; wife to transport pt to SNF at d/c; pt currently bathing with assistance of NT; IV and tele monitor d/c.

## 2014-01-20 NOTE — Progress Notes (Signed)
Rehab Admissions Coordinator Note:  Patient was screened by Ichiro Chesnut L for appropriateness for an Inpatient Acute Rehab Consult.  At this time, we are recommending [place]. Per latest PT note, pt is ambulating 78' with supervision assist and is too high level for CIR.   Brittian Renaldo L 01/20/2014, 9:55 AM  I can be reached at [phone].

## 2014-01-20 NOTE — Progress Notes (Addendum)
      [address]       [place] [address]             [phone]        6 Days Post-Op Procedure(s) (LRB): INTRAOPERATIVE TRANSESOPHAGEAL ECHOCARDIOGRAM (N/A) TRANSCATHETER AORTIC VALVE REPLACEMENT, TRANSAORTIC (N/A) CHEST TUBE INSERTION (Left)  Subjective: Patient with intermittent nausea since surgery. Last emesis was yesterday morning after breakfast.  Objective: Vital signs in last 24 hours: Temp:  [97 F (36.1 C)-97.7 F (36.5 C)] 97.7 F (36.5 C) (02/09 5277) Pulse Rate:  [71-80] 73 (02/09 8242) Cardiac Rhythm:  [-] Normal sinus rhythm (02/08 2100) Resp:  [18] 18 (02/09 0632) BP: (98-135)/(57-78) 115/57 mmHg (02/09 0632) SpO2:  [96 %-100 %] 100 % (02/09 3536) Weight:  [77.701 kg (171 lb 4.8 oz)] 77.701 kg (171 lb 4.8 oz) (02/09 1443)  Current Weight  01/20/14 77.701 kg (171 lb 4.8 oz)      Intake/Output from previous day: 02/08 0701 - 02/09 0700 In: 480 [P.O.:480] Out: 1125 [Urine:1125]   Physical Exam:  Cardiovascular: RRR, no murmurs, gallops, or rubs. Pulmonary: Clear on the right and slightly diminished at left bases no rales, wheezes, or rhonchi. Abdomen: Soft, non tender, bowel sounds present. Extremities: No LE edema Wounds: Dressing is clean and dry.    Lab Results: CBC:  Recent Labs  01/19/14 0400  WBC 8.0  HGB 12.0*  HCT 34.9*  PLT 403*   BMET:   Recent Labs  01/19/14 0400  NA 135*  K 4.5  CL 97  CO2 23  GLUCOSE 101*  BUN 30*  CREATININE 1.13  CALCIUM 8.9    PT/INR:  Lab Results  Component Value Date   INR 1.80* 01/20/2014   INR 1.41 01/19/2014   INR 1.25 01/18/2014   ABG:  INR: Will add last result for INR, ABG once components are confirmed Will add last 4 CBG results once components are confirmed  Assessment/Plan:  1. CV - SR. On Lopressor 12.5 bid and Coumadin. INR increased from 1.41 to 1.80. 2.  Pulmonary - Encourage incentive spirometer.Mucinex for cough. 3. Volume Overload - On Lasix 40  daily. Almost at pre op weight. 4.  Acute blood loss anemia - Last H and H stable at 12 and 34.9 5.GI-Nausea and emesis over the weekend. Zofran PRN. No abdominal pain. Bowel movement yesterday. 6.CIR vs SNF  ZIMMERMAN,DONIELLE MPA-C 01/20/2014,7:27 AM  I have seen and examined the patient and agree with the assessment and plan as outlined.  Ready for d/c.  [name],CLARENCE H 01/20/2014 8:14 AM

## 2014-01-20 NOTE — Progress Notes (Signed)
Occupational Therapy Treatment Patient Details Name: Lucas Green MRN: [id number] DOB: Aug 10, 1932 Today's Date: 01/20/2014 Time: 1017-5102 OT Time Calculation (min): 28 min  OT Assessment / Plan / Recommendation  History of present illness 80 yo male admitted with weakness, pleural effusion, FTT. Hx of HTN, CVA, arthritis, prostate cancer with mets, GERD.  S/P TAVR.     OT comments  Pt demonstrating decreased activity tolerance.  Able to perform toileting and grooming in standing and them required return to bed to rest.  Plan now is for SNF.   Follow Up Recommendations  SNF    Barriers to Discharge       Equipment Recommendations       Recommendations for Other Services    Frequency Min 2X/week   Progress towards OT Goals Progress towards OT goals: Progressing toward goals  Plan Discharge plan needs to be updated    Precautions / Restrictions Precautions Precautions: Fall Restrictions Weight Bearing Restrictions: No   Pertinent Vitals/Pain No pain, VSS    ADL  Grooming: Wash/dry hands;Teeth care;Supervision/safety Where Assessed - Grooming: Unsupported standing Toilet Transfer: Min Psychiatric nurse Method: Sit to Loss adjuster, chartered: Raised toilet seat with arms (or 3-in-1 over toilet) Toileting - Clothing Manipulation and Hygiene: Supervision/safety Where Assessed - Camera operator Manipulation and Hygiene: Standing Equipment Used: Rolling walker Transfers/Ambulation Related to ADLs: min guard assist with RW ADL Comments: Pt very fatigued after toileting and grooming activities.    OT Diagnosis:    OT Problem List:   OT Treatment Interventions:     OT Goals(current goals can now be found in the care plan section) Acute Rehab OT Goals Patient Stated Goal: to be able to get up and get stronger  Visit Information  Last OT Received On: 01/20/14 Assistance Needed: +1 History of Present Illness: 80 yo male admitted with weakness, pleural  effusion, FTT. Hx of HTN, CVA, arthritis, prostate cancer with mets, GERD.  Work up for TAVR.      Subjective Data      Prior Functioning       Cognition  Cognition Arousal/Alertness: Awake/alert Behavior During Therapy: WFL for tasks assessed/performed Overall Cognitive Status: Within Functional Limits for tasks assessed    Mobility  Bed Mobility Overal bed mobility: Needs Assistance Bed Mobility: Rolling;Sidelying to Sit;Sit to Supine Rolling: Modified independent (Device/Increase time) Sidelying to sit: Supervision Sit to supine: Min assist (for LEs) Transfers Equipment used: Rolling walker (2 wheeled) Transfers: Sit to/from Stand Sit to Stand: Min guard;Supervision General transfer comment: min guard from elevated bed and from 3 in 1 over toilet    Exercises      Balance    End of Session OT - End of Session Activity Tolerance: Patient tolerated treatment well Patient left: in bed;with call bell/phone within reach;with family/visitor present  GO     Lucas Green 01/20/2014, 1:41 PM

## 2014-01-20 NOTE — Progress Notes (Addendum)
R and L sided chest tube sutures d/c at this time per request of Dr. Roxy Manns; steri strips applied to both sites; no drainage noted; will cont. To monitor.

## 2014-01-20 NOTE — Progress Notes (Signed)
Rehab admissions - We received a n order for a prescreen, which we completed today.  I then received a call from PT that patient/wife upset about no inpatient rehab stay.  I met with patient and his wife.  I explained that he is doing too well to justify an acute inpatient rehab stay.  Patient related that he feels weak and is fearful of falling, as he has fallen at home.  Social worker in and patient does have a bed at [facility] in [place].  SNF will be a good option to regain strength.  Call me for questions.  #[phone]

## 2014-01-20 NOTE — Progress Notes (Addendum)
Patient has confirmed bed at [facility] in [place]. CSW went to speak to patient and patient's wife to inform them of bed offer. Patient and patient's wife in agreement to [facility] and wife states she will transport husband to facility. CSW provided packet to patient and patient's wife and is signing off at this time.   Jeanette Caprice, MSW, [place]

## 2014-01-20 NOTE — Progress Notes (Signed)
Pt for d/c. Reviewed ex and [facility] with pt and wife. He did not remember education from Saturday. Agree to referral for [facility] in [place]. Pt declined ambulating. [id number] [place], ACSM 2:17 PM 01/20/2014

## 2014-01-30 ENCOUNTER — Encounter: Payer: Medicare Other | Admitting: Nurse Practitioner

## 2014-02-10 ENCOUNTER — Telehealth: Payer: Self-pay | Admitting: Cardiovascular Disease

## 2014-02-10 NOTE — Telephone Encounter (Signed)
Orders faxed to [phone] and confirmation received

## 2014-02-10 NOTE — Telephone Encounter (Signed)
New message  Patient needs order faxed over to Dr Lucas Green for INR labs. He has an appointment schedule for Thursday. Fax # [phone]. Please call and advise.

## 2014-02-12 ENCOUNTER — Other Ambulatory Visit: Payer: Self-pay | Admitting: *Deleted

## 2014-02-12 DIAGNOSIS — J90 Pleural effusion, not elsewhere classified: Secondary | ICD-10-CM

## 2014-02-14 ENCOUNTER — Ambulatory Visit (HOSPITAL_COMMUNITY): Payer: Medicare Other

## 2014-02-14 ENCOUNTER — Encounter (HOSPITAL_COMMUNITY): Payer: Medicare Other | Admitting: Thoracic Surgery (Cardiothoracic Vascular Surgery)

## 2014-02-14 ENCOUNTER — Encounter (HOSPITAL_COMMUNITY): Payer: Medicare Other | Admitting: Cardiovascular Disease

## 2014-02-17 ENCOUNTER — Ambulatory Visit (INDEPENDENT_AMBULATORY_CARE_PROVIDER_SITE_OTHER): Payer: Medicare Other | Admitting: Thoracic Surgery (Cardiothoracic Vascular Surgery)

## 2014-02-17 ENCOUNTER — Ambulatory Visit
Admission: RE | Admit: 2014-02-17 | Discharge: 2014-02-17 | Disposition: A | Discharge status: Home / Self Care | Payer: Medicare Other | Source: Ambulatory Visit | Attending: Thoracic Surgery (Cardiothoracic Vascular Surgery) | Admitting: Thoracic Surgery (Cardiothoracic Vascular Surgery)

## 2014-02-17 ENCOUNTER — Encounter: Payer: Self-pay | Admitting: Thoracic Surgery (Cardiothoracic Vascular Surgery)

## 2014-02-17 ENCOUNTER — Ambulatory Visit (HOSPITAL_COMMUNITY)
Admission: RE | Admit: 2014-02-17 | Discharge: 2014-02-17 | Disposition: A | Discharge status: Home / Self Care | Payer: Medicare Other | Source: Ambulatory Visit | Attending: Cardiovascular Disease | Admitting: Cardiovascular Disease

## 2014-02-17 ENCOUNTER — Telehealth: Payer: Self-pay | Admitting: Cardiology

## 2014-02-17 VITALS — BP 98/57 | HR 70 | Resp 16 | Ht 74.0 in | Wt 180.0 lb

## 2014-02-17 DIAGNOSIS — I359 Nonrheumatic aortic valve disorder, unspecified: Secondary | ICD-10-CM

## 2014-02-17 DIAGNOSIS — I059 Rheumatic mitral valve disease, unspecified: Secondary | ICD-10-CM

## 2014-02-17 DIAGNOSIS — I350 Nonrheumatic aortic (valve) stenosis: Secondary | ICD-10-CM

## 2014-02-17 DIAGNOSIS — Z954 Presence of other heart-valve replacement: Secondary | ICD-10-CM

## 2014-02-17 DIAGNOSIS — Z952 Presence of prosthetic heart valve: Secondary | ICD-10-CM

## 2014-02-17 DIAGNOSIS — J90 Pleural effusion, not elsewhere classified: Secondary | ICD-10-CM

## 2014-02-17 NOTE — Telephone Encounter (Signed)
New problem   [facility] called and needs a sooner appointment for this pt please if possible per Dr Roxy Manns, Braulio Conte.

## 2014-02-17 NOTE — Progress Notes (Signed)
*  PRELIMINARY RESULTS* Echocardiogram 2D Echocardiogram has been performed.  Lucas Green 02/17/2014, 2:26 PM

## 2014-02-17 NOTE — Telephone Encounter (Signed)
Left message for pt to call  Left message for cindy at [facility]

## 2014-02-17 NOTE — Patient Instructions (Addendum)
Schedule an appointment with Dr Stanford Breed for follow up as soon as practical  The patient may continue to gradually increase their physical activity as tolerated without any particular limitations.  Endocarditis is a potentially serious infection of heart valves or inside lining of the heart.  It occurs more commonly in patients with diseased heart valves (such as patient's with aortic or mitral valve disease) and in patients who have undergone heart valve repair or replacement.  Certain surgical and dental procedures may put you at risk, such as dental cleaning, other dental procedures, or any surgery involving the respiratory, urinary, gastrointestinal tract, gallbladder or prostate gland.   To minimize your chances for develooping endocarditis, maintain good oral health and seek prompt medical attention for any infections involving the mouth, teeth, gums, skin or urinary tract.  Always notify your doctor or dentist about your underlying heart valve condition before having any invasive procedures. You will need to take antibiotics before certain procedures.

## 2014-02-17 NOTE — Progress Notes (Addendum)
[address] SpringsSuite [address]       [place] [address]             [phone]     CARDIOTHORACIC SURGERY OFFICE NOTE  Referring Provider is Lelon Perla, MD PCP is Teressa Lower, MD   HPI:  Patient returns for followup status post transcatheter aortic valve replacement using a 26 mm Edwards Sapien XT transcatheter heart valve placed via direct aortic approach through a right anterior minithoracotomy on 01/14/2014.  He originally presented in mid January with decompensated class IV congestive heart failure, persistent atrial fibrillation and failure to thrive.  He was found to have severe aortic stenosis and severe left ventricular dysfunction. He improved with medical therapy for congestive heart failure, although based upon his severely decompensated physical condition and history of metastatic prostate cancer he was felt to be high-risk for conventional surgical aortic valve replacement.  Transcatheter aortic valve replacement was offered as a alternative to high-risk surgery. Postoperatively he has done remarkably well. He was ultimately discharged to a skilled nursing facility for physical rehabilitation. He stayed there for several weeks but has now been home for nearly 2 weeks. He has continued to make excellent progress. He is still fairly weak, but he has made gradual strides with improved strength. He still uses a walker to steady himself with ambulation, but he is walking at a fairly brisk pace. He states that he gets short of breath and tired he pushes himself, but his breathing does not bother him with normal gait daily activities. He denies any PND, orthopnea, or lower extremity edema.  He has never had any significant pain and he has not taken any sort of pain relievers. He had a mild dizzy spell this morning, but this is new and he has not previously had any sort of dizzy spells. He denies any palpitations. His prothrombin time has been monitored and Coumadin dose adjusted  through the nursing home where he was staying, although he states that his primary care physician will be taking over this roll. He is scheduled to see Dr. Garlon Hatchet in the office tomorrow. He denies any fevers chills or productive cough. His appetite is fairly good and he has gained a few pounds over the past few weeks. Overall he is delighted with his progress but he remains frustrated that his strength has not improved more rapidly.  He has not yet been seen in followup by Dr. Stanford Breed, and he does not have an appointment scheduled to see him.  Current Outpatient Prescriptions  Medication Sig Dispense Refill  . aspirin 81 MG chewable tablet Chew 1 tablet (81 mg total) by mouth daily.      . feeding supplement, RESOURCE BREEZE, (RESOURCE BREEZE) LIQD Take 1 Container by mouth 2 (two) times daily between meals.    0  . Histrelin Acetate (VANTAS) 50 MG KIT Inject into the skin. SKIN IMPLANT      . ibuprofen (ADVIL,MOTRIN) 800 MG tablet Take 800 mg by mouth every 8 (eight) hours as needed for pain.      . metoprolol tartrate (LOPRESSOR) 12.5 mg TABS tablet Take 0.5 tablets (12.5 mg total) by mouth 2 (two) times daily.      . Multiple Vitamin (MULTIVITAMIN WITH MINERALS) TABS Take 1 tablet by mouth daily.      . pantoprazole (PROTONIX) 40 MG tablet Take 40 mg by mouth daily.      . polyvinyl alcohol (LIQUIFILM TEARS) 1.4 % ophthalmic solution Place 1 drop  into both eyes as needed for dry eyes.  15 mL  0  . simvastatin (ZOCOR) 40 MG tablet Take 40 mg by mouth daily.      Marland Kitchen warfarin (COUMADIN) 2.5 MG tablet Take 1 tablet (2.5 mg total) by mouth daily.       No current facility-administered medications for this visit.      Physical Exam:   BP 98/57  Pulse 70  Resp 16  Ht $R'6\' 2"'nj$  (1.88 m)  Wt 180 lb (81.647 kg)  BMI 23.10 kg/m2  SpO2 98%  General:  Frail but well-appearing  Chest:   Clear to auscultation  CV:   Regular rate and rhythm with soft systolic murmur along the sternal  border  Incisions:  Healed nicely  Abdomen:  Soft and nontender  Extremities:  Warm and well-perfused, no lower extremity edema  Diagnostic Tests:  CHEST 2 VIEW  COMPARISON: DG CHEST 2 VIEW dated 01/19/2014; CT HEART MORP W/CTA COR  W/SCORE W/CA W/CM &/OR WO/CM dated 01/07/2014  FINDINGS:  Trachea is midline. Heart size normal. Small amount left pleural  fluid appears loculated anteriorly. Lungs are clear. No pleural  fluid. No pneumothorax. Plate and screw fixation along the right  second anterior rib.  IMPRESSION:  Small amount of pleural fluid in the lower left hemi thorax appears  loculated anteriorly.  Electronically Signed  By: Lorin Picket M.D.  On: 02/17/2014 15:04   Followup echocardiogram performed today is reviewed. This exam has not yet been officially read. The aortic valve prosthesis appears to be functioning normally. I do not appreciate any aortic insufficiency. There remains severe global left ventricular dysfunction.    Impression:  Patient is progressing well proximally one month following transcatheter aortic valve replacement. His strength is slowly improving. He has mild exertional shortness of breath with symptoms consistent with New York Heart Association functional class II chronic combined systolic and diastolic congestive heart failure.      Plan:  The patient should remain on both low dose aspirin and Coumadin for persistent atrial fibrillation with recently placed transcatheter heart valve and history of stroke in the past. He is also on low dose beta blocker with good rate control for his atrial fibrillation.  He might benefit from additional of an ACE inhibitor or ARB, but his blood pressure remains relatively low.  I have not made any changes to his current medications today. I have reminded the patient and his wife that he needs to be seen by Dr. Stanford Breed for followup in the near future. I have encouraged him to continue to increase his physical  activity without any particular limitations at this time. He has also been reminded regarding the need for antibiotic prophylaxis for all dental procedures and cleanings and other high-risk surgical procedures in an effort to diminish his risk of prosthetic valve endocarditis.  All of his questions been addressed. We will plan to see the patient for routine followup in the multidisciplinary heart valve clinic in one year. We will obtain a routine echocardiogram at that time.   I spent in excess of 30 minutes during the conduct of this office consultation and >50% of this time involved direct face-to-face encounter with the patient for counseling and/or coordination of their care.   Valentina Gu. Roxy Manns, MD 02/17/2014 3:42 PM

## 2014-02-19 NOTE — Telephone Encounter (Signed)
Pt will see the pa 03-06-14.

## 2014-02-27 ENCOUNTER — Encounter: Payer: Self-pay | Admitting: *Deleted

## 2014-03-06 ENCOUNTER — Encounter: Payer: Self-pay | Admitting: Physician Assistant

## 2014-03-06 ENCOUNTER — Ambulatory Visit (INDEPENDENT_AMBULATORY_CARE_PROVIDER_SITE_OTHER): Payer: Medicare Other | Admitting: Physician Assistant

## 2014-03-06 VITALS — BP 120/66 | HR 75 | Ht 74.0 in | Wt 192.0 lb

## 2014-03-06 DIAGNOSIS — G43109 Migraine with aura, not intractable, without status migrainosus: Secondary | ICD-10-CM

## 2014-03-06 DIAGNOSIS — I10 Essential (primary) hypertension: Secondary | ICD-10-CM

## 2014-03-06 DIAGNOSIS — E785 Hyperlipidemia, unspecified: Secondary | ICD-10-CM

## 2014-03-06 DIAGNOSIS — I5042 Chronic combined systolic (congestive) and diastolic (congestive) heart failure: Secondary | ICD-10-CM

## 2014-03-06 DIAGNOSIS — I428 Other cardiomyopathies: Secondary | ICD-10-CM

## 2014-03-06 DIAGNOSIS — C799 Secondary malignant neoplasm of unspecified site: Secondary | ICD-10-CM

## 2014-03-06 DIAGNOSIS — I359 Nonrheumatic aortic valve disorder, unspecified: Secondary | ICD-10-CM

## 2014-03-06 DIAGNOSIS — Z954 Presence of other heart-valve replacement: Secondary | ICD-10-CM

## 2014-03-06 DIAGNOSIS — I6350 Cerebral infarction due to unspecified occlusion or stenosis of unspecified cerebral artery: Secondary | ICD-10-CM

## 2014-03-06 DIAGNOSIS — Z952 Presence of prosthetic heart valve: Secondary | ICD-10-CM

## 2014-03-06 DIAGNOSIS — C61 Malignant neoplasm of prostate: Secondary | ICD-10-CM

## 2014-03-06 NOTE — Progress Notes (Signed)
[facility], [place], [place]  [address] Phone: [phone] Fax:  [phone]  Date:  03/06/2014   ID:  [name], [name] 1932/10/12, MRN [id number]  PCP:  Teressa Lower, MD  Cardiologist:  Dr. 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  He was admitted 1/19-2/9. He initially presented with progressive weakness, fatigue and failure to thrive. He was followed by cardiology for acute on chronic combined systolic and diastolic CHF in the setting of paroxysmal atrial fibrillation and severe aortic stenosis.  Echocardiogram demonstrated severe aortic stenosis with a mean gradient of 43. He was ultimately evaluated by Dr. Burt Knack and felt to be a good candidate for TAVR. He underwent TAVR with Drs. Noemi Chapel, Bartle and [place] on 01/14/14 via a transaortic approach. Patient maintained NSR. He was placed on Coumadin given his history of prior CVA and atrial fibrillation.  The remainder of his postoperative course was fairly uneventful. He was recently seen in follow up by Dr. Roxy Manns 02/17/14.  ACEI/ARB has not been started in the past due to low BPs.  Patient continues to make steady progress. He did spend 3 weeks at [facility]. He is back at home now. He is attending cardiac rehabilitation at [facility]. He denies any chest soreness. He continues to note some dyspnea with exertion. He probably describes NYHA Class IIb symptoms. He has had a gradual weight gain. He has increased his caloric intake 2/2 weight loss from his cancer treatments. He denies orthopnea, PND. He denies significant pedal edema. He denies syncope.  Studies:  - LHC (01/06/14):  Ostial left main 20-30 and 30, OM1 30.  - Echo (01/01/14):  EF 98-33%, grade 2 diastolic dysfunction, severe aortic stenosis (Mean gradient 43 mm of mercury), mild LAE, PASP 45.  - Echo  (02/18/12):  Mild LVH, EF 35-40%, diffuse HK, grade 1 diastolic dysfunction, TAVR okay (mean gradient 10 mm of mercury), MAC, mild MR, mod LAE, trivial effusion  - Carotid US (03/30/13):  No significant ICA stenosis   Recent Labs: 03/30/2013: HDL Cholesterol 55; LDL (calc) 171*  01/04/2014: TSH 7.102*  01/19/2014: ALT 51; Creatinine 1.13; Hemoglobin 12.0*; Potassium 4.5; Pro B Natriuretic peptide (BNP) 2105.0*   Wt Readings from Last 3 Encounters:  03/06/14 192 lb (87.091 kg)  02/17/14 180 lb (81.647 kg)  01/20/14 171 lb 4.8 oz (77.701 kg)     Past Medical History  Diagnosis Date  . Hypertension   . GERD (gastroesophageal reflux disease)   . Dyslipidemia   . Aortic stenosis   . Prostate cancer 09/2009  . 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 Hearing loss   . Incontinence of urine   . 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 Degenerative arthritis     osteoarthritis  . Stroke 03/29/13  . Hx of radiation therapy 09/30/13- 10/11/13    right hip/ischium, 3000 cGy 10 sessions  . Esophageal stricture   . Hiatal hernia   . Diverticulosis   . Moderate protein-calorie malnutrition 12/30/2013  . Failure to thrive 12/30/2013  . Pleural effusion, left 12/30/2013  . Severe aortic stenosis   . Atrial fibrillation   . Chronic diastolic congestive heart failure   . Acute on chronic diastolic heart failure   . S/P TAVR (transcatheter aortic valve replacement) 01/14/2014    26  mm Edwards Sapien XT transcatheter heart valve placed via direct aortic approach using right anterior mini-thoracotomy     Current Outpatient Prescriptions  Medication Sig Dispense Refill  . aspirin 81 MG chewable tablet Chew 1 tablet (81 mg total) by mouth daily.      . feeding supplement, RESOURCE BREEZE, (RESOURCE BREEZE) LIQD Take 1 Container by mouth 2 (two) times daily between meals.    0  . Histrelin Acetate (VANTAS [place]) Inject into the skin.      Marland Kitchen Histrelin Acetate (VANTAS) 50 MG KIT Inject into the skin. SKIN  IMPLANT      . ibuprofen (ADVIL,MOTRIN) 800 MG tablet Take 800 mg by mouth every 8 (eight) hours as needed for pain.      . Iron-Vitamins (GERITOL) LIQD Take 1 Container by mouth daily.      . metoprolol tartrate (LOPRESSOR) 25 MG tablet Take 25 mg by mouth 2 (two) times daily.      . Multiple Vitamin (MULTIVITAMIN WITH MINERALS) TABS Take 1 tablet by mouth daily.      . pantoprazole (PROTONIX) 40 MG tablet Take 40 mg by mouth daily.      . polyvinyl alcohol (LIQUIFILM TEARS) 1.4 % ophthalmic solution Place 1 drop into both eyes as needed for dry eyes.  15 mL  0  . simvastatin (ZOCOR) 40 MG tablet Take 40 mg by mouth daily.      Marland Kitchen warfarin (COUMADIN) 6 MG tablet Take 6 mg by mouth daily.       No current facility-administered medications for this visit.    Allergies:   Other   Social History:  The patient  reports that he has never smoked. He has never used smokeless tobacco. He reports that he drinks alcohol. He reports that he does not use illicit drugs.   Family History:  The patient's family history includes Cancer in his father; Cancer - Colon in his mother; Cancer - Lung in his cousin; Heart attack in his father; Heart disease in his father; Hypertension in his maternal grandfather and paternal grandfather; Stroke in his maternal grandfather and paternal grandfather; Thyroid disease in his mother.   ROS:  Please see the history of present illness.   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 He's had a recurrence of these symptoms recently. He denies any symptoms consistent with amaurosis fugax or TIA.   All other systems reviewed and negative.   PHYSICAL EXAM: VS:  BP 120/66  Pulse 75  Ht 6\' 2"  (1.88 m)  Wt 192 lb (87.091 kg)  BMI 24.64 kg/m2 Well nourished, well developed, in no acute distress HEENT: normal Neck: no JVD Cardiac:  normal S1, S2; RRR; short 1/6 systolic murmur at RUSB Lungs:  Decreased breath sounds bilaterally, no wheezing, rhonchi or  rales Abd: soft, nontender, no hepatomegaly Ext: trace bilateral edema Skin: warm and dry Neuro:  CNs 2-12 intact, no focal abnormalities noted  EKG:  NSR, HR 75, normal axis, no ST changes     ASSESSMENT AND PLAN:  1. Aortic Stenosis s/p TAVR:  He is progressing nicely since recent TAVR.  Recent echo with well seated valve and no significant gradient.  Continue SBE prophylaxis.  Continue cardiac rehab at [facility].   2. Non-Ischemic CM:  Continue beta blocker.  I still am hesitant to add ACEI as his BP tends to run low (90-100 at cardiac rehab).  Continue current Rx.  3. Chronic Combined Systolic and Diastolic CHF:  Volume stable off  diuretics.  Continue current rx.  4. CAD:   No angina.  Non-obstructive by cath prior to TAVR.  Continue ASA, statin. 5. Hyperlipidemia:  Continue statin.  6. Hypertension:  Controlled.  7. Metastatic Prostate CA:  F/u with oncology as planned. 8. Ocular Migraines:  I do not believe this is related to his recent surgery.  No symptoms of AFugax or TIA.  He has had these symptoms in the past.  His INR is therapeutic and he is on ASA.  I have asked him to monitor these and to have follow up with primary care if they continue (could consider referral to neurology at that point). 9. Disposition:  F/u with Dr. [name] in 6-8 weeks.  Signed, Lucas Dopp, PA-C  03/06/2014 11:36 AM

## 2014-03-06 NOTE — Patient Instructions (Signed)
Your physician recommends that you continue on your current medications as directed. Please refer to the Current Medication list given to you today.  Your physician recommends that you schedule a follow-up appointment ON 05/02/14 @ 11:15 WITH DR. CRENSHAW

## 2014-03-21 ENCOUNTER — Inpatient Hospital Stay (HOSPITAL_COMMUNITY)
Admission: EM | Admit: 2014-03-21 | Discharge: 2014-03-27 | DRG: 543 | Disposition: A | Discharge status: Rehabilitation Facility | Payer: Medicare Other | Attending: Internal Medicine | Admitting: Internal Medicine

## 2014-03-21 ENCOUNTER — Encounter (HOSPITAL_COMMUNITY): Payer: Self-pay | Admitting: Emergency Medicine

## 2014-03-21 DIAGNOSIS — Z7901 Long term (current) use of anticoagulants: Secondary | ICD-10-CM

## 2014-03-21 DIAGNOSIS — C7951 Secondary malignant neoplasm of bone: Secondary | ICD-10-CM | POA: Diagnosis present

## 2014-03-21 DIAGNOSIS — R29898 Other symptoms and signs involving the musculoskeletal system: Secondary | ICD-10-CM | POA: Diagnosis present

## 2014-03-21 DIAGNOSIS — R531 Weakness: Secondary | ICD-10-CM

## 2014-03-21 DIAGNOSIS — I480 Paroxysmal atrial fibrillation: Secondary | ICD-10-CM

## 2014-03-21 DIAGNOSIS — I69998 Other sequelae following unspecified cerebrovascular disease: Secondary | ICD-10-CM

## 2014-03-21 DIAGNOSIS — IMO0002 Reserved for concepts with insufficient information to code with codable children: Secondary | ICD-10-CM

## 2014-03-21 DIAGNOSIS — R112 Nausea with vomiting, unspecified: Secondary | ICD-10-CM

## 2014-03-21 DIAGNOSIS — Z801 Family history of malignant neoplasm of trachea, bronchus and lung: Secondary | ICD-10-CM

## 2014-03-21 DIAGNOSIS — Z823 Family history of stroke: Secondary | ICD-10-CM

## 2014-03-21 DIAGNOSIS — D649 Anemia, unspecified: Secondary | ICD-10-CM

## 2014-03-21 DIAGNOSIS — R52 Pain, unspecified: Secondary | ICD-10-CM | POA: Diagnosis present

## 2014-03-21 DIAGNOSIS — E876 Hypokalemia: Secondary | ICD-10-CM

## 2014-03-21 DIAGNOSIS — M8440XA Pathological fracture, unspecified site, initial encounter for fracture: Secondary | ICD-10-CM

## 2014-03-21 DIAGNOSIS — E785 Hyperlipidemia, unspecified: Secondary | ICD-10-CM | POA: Diagnosis present

## 2014-03-21 DIAGNOSIS — Z8673 Personal history of transient ischemic attack (TIA), and cerebral infarction without residual deficits: Secondary | ICD-10-CM

## 2014-03-21 DIAGNOSIS — Z8249 Family history of ischemic heart disease and other diseases of the circulatory system: Secondary | ICD-10-CM

## 2014-03-21 DIAGNOSIS — C7952 Secondary malignant neoplasm of bone marrow: Secondary | ICD-10-CM

## 2014-03-21 DIAGNOSIS — I350 Nonrheumatic aortic (valve) stenosis: Secondary | ICD-10-CM

## 2014-03-21 DIAGNOSIS — Z952 Presence of prosthetic heart valve: Secondary | ICD-10-CM

## 2014-03-21 DIAGNOSIS — K5730 Diverticulosis of large intestine without perforation or abscess without bleeding: Secondary | ICD-10-CM | POA: Diagnosis present

## 2014-03-21 DIAGNOSIS — I5043 Acute on chronic combined systolic (congestive) and diastolic (congestive) heart failure: Secondary | ICD-10-CM

## 2014-03-21 DIAGNOSIS — M84454A Pathological fracture, pelvis, initial encounter for fracture: Secondary | ICD-10-CM | POA: Diagnosis present

## 2014-03-21 DIAGNOSIS — Z954 Presence of other heart-valve replacement: Secondary | ICD-10-CM

## 2014-03-21 DIAGNOSIS — K5900 Constipation, unspecified: Secondary | ICD-10-CM | POA: Diagnosis present

## 2014-03-21 DIAGNOSIS — I10 Essential (primary) hypertension: Secondary | ICD-10-CM | POA: Diagnosis present

## 2014-03-21 DIAGNOSIS — J189 Pneumonia, unspecified organism: Secondary | ICD-10-CM

## 2014-03-21 DIAGNOSIS — M8448XA Pathological fracture, other site, initial encounter for fracture: Principal | ICD-10-CM | POA: Diagnosis present

## 2014-03-21 DIAGNOSIS — N289 Disorder of kidney and ureter, unspecified: Secondary | ICD-10-CM | POA: Diagnosis present

## 2014-03-21 DIAGNOSIS — C61 Malignant neoplasm of prostate: Secondary | ICD-10-CM | POA: Diagnosis present

## 2014-03-21 DIAGNOSIS — I509 Heart failure, unspecified: Secondary | ICD-10-CM | POA: Diagnosis present

## 2014-03-21 DIAGNOSIS — E440 Moderate protein-calorie malnutrition: Secondary | ICD-10-CM

## 2014-03-21 DIAGNOSIS — I5032 Chronic diastolic (congestive) heart failure: Secondary | ICD-10-CM | POA: Diagnosis present

## 2014-03-21 DIAGNOSIS — H9190 Unspecified hearing loss, unspecified ear: Secondary | ICD-10-CM | POA: Diagnosis present

## 2014-03-21 DIAGNOSIS — J90 Pleural effusion, not elsewhere classified: Secondary | ICD-10-CM

## 2014-03-21 DIAGNOSIS — S329XXA Fracture of unspecified parts of lumbosacral spine and pelvis, initial encounter for closed fracture: Secondary | ICD-10-CM

## 2014-03-21 DIAGNOSIS — I359 Nonrheumatic aortic valve disorder, unspecified: Secondary | ICD-10-CM | POA: Diagnosis present

## 2014-03-21 DIAGNOSIS — I5033 Acute on chronic diastolic (congestive) heart failure: Secondary | ICD-10-CM

## 2014-03-21 DIAGNOSIS — I4891 Unspecified atrial fibrillation: Secondary | ICD-10-CM | POA: Diagnosis present

## 2014-03-21 DIAGNOSIS — C799 Secondary malignant neoplasm of unspecified site: Secondary | ICD-10-CM

## 2014-03-21 DIAGNOSIS — K219 Gastro-esophageal reflux disease without esophagitis: Secondary | ICD-10-CM | POA: Diagnosis present

## 2014-03-21 MED ORDER — TRAMADOL HCL 50 MG PO TABS
50.0000 mg | ORAL_TABLET | Freq: Once | ORAL | Status: AC
  Administered 2014-03-21: 50 mg via ORAL
  Filled 2014-03-21: qty 1

## 2014-03-21 NOTE — ED Notes (Addendum)
Per [facility], pt is coming from home for hip pain primarily in the right hip radiating down the right leg. This pain has been worsening as of lately and is now unbearable. Pt reports hx of non traumatic right pelvis fracture from metastasis of prostate cancer.  NAD at this time. Pt took ibuprofen with no relief tonight.

## 2014-03-21 NOTE — ED Provider Notes (Signed)
CSN: [id number]     Arrival date & time 03/21/14  2248 History   None    Chief Complaint  Patient presents with  . 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  Patient had been pain free until the past week or so. No recent trauma.   He has had progressively severe right hip pain x past few days. He is now unable to stand or bear weight. The pain is also worse with movement of the right hip. Pain radiates from the mid right pelvic region to the right hip and the lateral right thigh. Patient has pain at rest. Rest pain is 7/10. Pain with weight bearing is 10/10. Has tried ibuprofen without adequate relief.    Past Medical History  Diagnosis Date  . Hypertension   . GERD (gastroesophageal reflux disease)   . Dyslipidemia   . Aortic stenosis   . Prostate cancer 09/2009  . 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 Hearing loss   . Incontinence of urine   . 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 Degenerative arthritis     osteoarthritis  . Stroke 03/29/13  . Hx of radiation therapy 09/30/13- 10/11/13    right hip/ischium, 3000 cGy 10 sessions  . Esophageal stricture   . Hiatal hernia   . Diverticulosis   . Moderate protein-calorie malnutrition 12/30/2013  . Failure to thrive 12/30/2013  . Pleural effusion, left 12/30/2013  . Severe aortic stenosis   . Atrial fibrillation   . Chronic diastolic congestive heart failure   . Acute on chronic diastolic heart failure   . S/P TAVR (transcatheter aortic valve replacement) 01/14/2014    26 mm Edwards Sapien XT transcatheter heart valve placed via direct aortic approach using right anterior mini-thoracotomy    Past Surgical History  Procedure Laterality Date  .  Tonsillectomy  1938  . Cryosurgery prostate  09/2009    prostate cancer, Gleason 7  . Intraoperative transesophageal echocardiogram N/A 01/14/2014    Procedure: INTRAOPERATIVE TRANSESOPHAGEAL ECHOCARDIOGRAM;  Surgeon: Rexene Alberts, MD;  Location: [facility];  Service: Open Heart Surgery;  Laterality: N/A;  . Transcatheter aortic valve replacement, transaortic N/A 01/14/2014    Procedure: TRANSCATHETER AORTIC VALVE REPLACEMENT, TRANSAORTIC;  Surgeon: Rexene Alberts, MD;  Location: [facility];  Service: Open Heart Surgery;  Laterality: N/A;  . Chest tube insertion Left 01/14/2014    Procedure: CHEST TUBE INSERTION;  Surgeon: Rexene Alberts, MD;  Location: [facility];  Service: Open Heart Surgery;  Laterality: Left;   Family History  Problem Relation Age of Onset  . Heart disease Father   . Cancer Father     prostate  . Heart attack Father   . Cancer - Colon Mother   . Thyroid disease Mother   . Stroke Maternal Grandfather   . Hypertension Maternal Grandfather   . Stroke Paternal Grandfather   . Hypertension Paternal Grandfather   . Cancer - Lung Cousin    History  Substance Use Topics  . Smoking status: Never Smoker   . Smokeless tobacco: Never Used  . Alcohol Use: Yes     Comment: occassionally    Review of Systems  Ten point review of symptoms performed and is negative with the exception of symptoms  noted above.    Allergies  Other  Home Medications   Current Outpatient Rx  Name  Route  Sig  Dispense  Refill  . aspirin 81 MG chewable tablet   Oral   Chew 1 tablet (81 mg total) by mouth daily.         Marland Kitchen ibuprofen (ADVIL,MOTRIN) 800 MG tablet   Oral   Take 800 mg by mouth every 8 (eight) hours as needed for pain.         . Iron-Vitamins (GERITOL) LIQD   Oral   Take 1 Container by mouth daily.         . metoprolol succinate (TOPROL-XL) 50 MG 24 hr tablet   Oral   Take 50 mg by mouth daily. Take with or immediately following a meal.         . Multiple Vitamin  (MULTIVITAMIN WITH MINERALS) TABS   Oral   Take 1 tablet by mouth daily.         . pantoprazole (PROTONIX) 40 MG tablet   Oral   Take 40 mg by mouth daily.         . simvastatin (ZOCOR) 20 MG tablet   Oral   Take 20 mg by mouth daily.         Marland Kitchen warfarin (COUMADIN) 6 MG tablet   Oral   Take 6 mg by mouth daily.          BP 173/78  Pulse 90  Temp(Src) 97.8 F (36.6 C) (Oral)  Resp 18  SpO2 99% Physical Exam Gen: well developed and well nourished appearing Head: NCAT Eyes: PERL, EOMI Nose: no epistaixis or rhinorrhea Mouth/throat: mucosa is moist and pink Neck: supple, no stridor Lungs: CTA B, no wheezing, rhonchi or rales CV: regular rate and rythm, good distal pulses.  Abd: soft, notender, nondistended Back: no ttp, no cva ttp Skin: warm and dry Ext: ttp over right anterior pelvis, painful but full ROM of right hip, some mild erythema noted over the right lateral thigh - nontender over this region. NVI. no edema, normal to inspection Neuro: CN ii-xii grossly intact, no focal deficits Psyche; normal affect,  calm and cooperative.  ED Course  Procedures (including critical care time) Labs Review  Results for orders placed during the hospital encounter of 03/21/14 (from the past 24 hour(s))  CBC     Status: Abnormal   Collection Time    03/22/14  1:44 AM      Result Value Ref Range   WBC 9.7  4.0 - 10.5 K/uL   RBC 3.32 (*) 4.22 - 5.81 MIL/uL   Hemoglobin 10.4 (*) 13.0 - 17.0 g/dL   HCT 30.4 (*) 39.0 - 52.0 %   MCV 91.6  78.0 - 100.0 fL   MCH 31.3  26.0 - 34.0 pg   MCHC 34.2  30.0 - 36.0 g/dL   RDW 13.5  11.5 - 15.5 %   Platelets 202  150 - 400 K/uL  BASIC METABOLIC PANEL     Status: Abnormal   Collection Time    03/22/14  1:44 AM      Result Value Ref Range   Sodium 135 (*) 137 - 147 mEq/L   Potassium 4.2  3.7 - 5.3 mEq/L   Chloride 99  96 - 112 mEq/L   CO2 21  19 - 32 mEq/L   Glucose, Bld 129 (*) 70 - 99 mg/dL   BUN 29 (*) 6 - 23 mg/dL    Creatinine, Ser  0.91  0.50 - 1.35 mg/dL   Calcium 9.3  8.4 - 10.5 mg/dL   GFR calc non Af Amer 78 (*) >90 mL/min   GFR calc Af Amer >90  >90 mL/min   DG Hip Complete Right (Final result)  Result time: 03/22/14 01:17:20    Final result by Rad Results In Interface (03/22/14 01:17:20)    Narrative:   CLINICAL DATA: Worsening right hip pain  EXAM: RIGHT HIP - COMPLETE 2+ VIEW  COMPARISON: 01/06/2014  FINDINGS: Multi focal bone metastasis are noted involving the bony pelvis bilaterally. Lesions are mixed lytic and sclerotic. Since the previous exam there has been progression of destructive bone changes involving the right anterior iliac spine. There are also pathologic fractures involving the right superior and inferior pubic rami. The right hip remains located. There are advanced changes of osteoarthritis.  IMPRESSION: Multi focal bone metastasis involving the pelvis.  Progression of metastasis involving the right anterior iliac spine  Pathologic fractures involving the right superior and inferior pubic rami.   Electronically Signed By: Kerby Moors M.D. On: 03/22/2014 01:17           MDM   DDX: fracture v. bony metastasis   Xray shows advancement of mets to the pelvis with pathological fx of the inferior and superior pubic rami on the right.  We are managing pain in the ED and, as the patient is unable to bear weight secondary to pain. Case discussed with Dr. Arnoldo Morale who will admit the patient for pain management and consultation with Rad Onc.     Elyn Peers, MD 03/22/14 [phone]

## 2014-03-22 ENCOUNTER — Emergency Department (HOSPITAL_COMMUNITY): Payer: Medicare Other

## 2014-03-22 DIAGNOSIS — I4891 Unspecified atrial fibrillation: Secondary | ICD-10-CM

## 2014-03-22 DIAGNOSIS — R52 Pain, unspecified: Secondary | ICD-10-CM | POA: Diagnosis present

## 2014-03-22 DIAGNOSIS — S329XXA Fracture of unspecified parts of lumbosacral spine and pelvis, initial encounter for closed fracture: Secondary | ICD-10-CM

## 2014-03-22 DIAGNOSIS — M84454A Pathological fracture, pelvis, initial encounter for fracture: Secondary | ICD-10-CM | POA: Diagnosis present

## 2014-03-22 DIAGNOSIS — N289 Disorder of kidney and ureter, unspecified: Secondary | ICD-10-CM

## 2014-03-22 DIAGNOSIS — M25559 Pain in unspecified hip: Secondary | ICD-10-CM

## 2014-03-22 DIAGNOSIS — I5032 Chronic diastolic (congestive) heart failure: Secondary | ICD-10-CM

## 2014-03-22 DIAGNOSIS — E785 Hyperlipidemia, unspecified: Secondary | ICD-10-CM

## 2014-03-22 DIAGNOSIS — I509 Heart failure, unspecified: Secondary | ICD-10-CM

## 2014-03-22 DIAGNOSIS — I10 Essential (primary) hypertension: Secondary | ICD-10-CM

## 2014-03-22 DIAGNOSIS — D649 Anemia, unspecified: Secondary | ICD-10-CM

## 2014-03-22 LAB — PROTIME-INR
INR: 1.82 — ABNORMAL HIGH (ref 0.00–1.49)
Prothrombin Time: 20.5 seconds — ABNORMAL HIGH (ref 11.6–15.2)

## 2014-03-22 LAB — CBC
HEMATOCRIT: 30.4 % — AB (ref 39.0–52.0)
HEMOGLOBIN: 10.4 g/dL — AB (ref 13.0–17.0)
MCH: 31.3 pg (ref 26.0–34.0)
MCHC: 34.2 g/dL (ref 30.0–36.0)
MCV: 91.6 fL (ref 78.0–100.0)
Platelets: 202 10*3/uL (ref 150–400)
RBC: 3.32 MIL/uL — ABNORMAL LOW (ref 4.22–5.81)
RDW: 13.5 % (ref 11.5–15.5)
WBC: 9.7 10*3/uL (ref 4.0–10.5)

## 2014-03-22 LAB — BASIC METABOLIC PANEL
BUN: 29 mg/dL — AB (ref 6–23)
CO2: 21 mEq/L (ref 19–32)
Calcium: 9.3 mg/dL (ref 8.4–10.5)
Chloride: 99 mEq/L (ref 96–112)
Creatinine, Ser: 0.91 mg/dL (ref 0.50–1.35)
GFR calc Af Amer: >90 mL/min (ref >90)
GFR, EST NON AFRICAN AMERICAN: 78 mL/min — AB (ref >90)
Glucose, Bld: 129 mg/dL — ABNORMAL HIGH (ref 70–99)
POTASSIUM: 4.2 meq/L (ref 3.7–5.3)
SODIUM: 135 meq/L — AB (ref 137–147)

## 2014-03-22 MED ORDER — ALUM & MAG HYDROXIDE-SIMETH 200-200-20 MG/5ML PO SUSP: 30.0000 mL | Freq: Four times a day (QID) | ORAL | Status: DC | PRN

## 2014-03-22 MED ORDER — ASPIRIN 81 MG PO CHEW
81.0000 mg | CHEWABLE_TABLET | Freq: Every day | ORAL | Status: DC
  Administered 2014-03-22 – 2014-03-27 (×6): 81 mg via ORAL
  Filled 2014-03-22 (×6): qty 1

## 2014-03-22 MED ORDER — ACETAMINOPHEN 650 MG RE SUPP: 650.0000 mg | Freq: Four times a day (QID) | RECTAL | Status: DC | PRN

## 2014-03-22 MED ORDER — WARFARIN SODIUM 6 MG PO TABS
6.0000 mg | ORAL_TABLET | Freq: Every day | ORAL | Status: DC
  Administered 2014-03-22 – 2014-03-27 (×6): 6 mg via ORAL
  Filled 2014-03-22 (×7): qty 1

## 2014-03-22 MED ORDER — SODIUM CHLORIDE 0.9 % IV SOLN: INTRAVENOUS | Status: DC

## 2014-03-22 MED ORDER — ADULT MULTIVITAMIN W/MINERALS CH
1.0000 | ORAL_TABLET | Freq: Every day | ORAL | Status: DC
  Administered 2014-03-22 – 2014-03-27 (×6): 1 via ORAL
  Filled 2014-03-22 (×6): qty 1

## 2014-03-22 MED ORDER — WARFARIN - PHYSICIAN DOSING INPATIENT
Freq: Every day | Status: DC
  Administered 2014-03-22: 18:00:00

## 2014-03-22 MED ORDER — METOPROLOL SUCCINATE ER 50 MG PO TB24
50.0000 mg | ORAL_TABLET | Freq: Every day | ORAL | Status: DC
  Administered 2014-03-22 – 2014-03-27 (×6): 50 mg via ORAL
  Filled 2014-03-22 (×8): qty 1

## 2014-03-22 MED ORDER — ACETAMINOPHEN 325 MG PO TABS
650.0000 mg | ORAL_TABLET | Freq: Four times a day (QID) | ORAL | Status: DC | PRN
  Administered 2014-03-22 – 2014-03-23 (×2): 650 mg via ORAL
  Filled 2014-03-22 (×2): qty 2

## 2014-03-22 MED ORDER — ONDANSETRON HCL 4 MG/2ML IJ SOLN
4.0000 mg | Freq: Once | INTRAMUSCULAR | Status: AC
  Administered 2014-03-22: 4 mg via INTRAVENOUS
  Filled 2014-03-22: qty 2

## 2014-03-22 MED ORDER — OXYCODONE HCL 5 MG PO TABS: 5.0000 mg | ORAL_TABLET | ORAL | Status: DC | PRN

## 2014-03-22 MED ORDER — DOCUSATE SODIUM 100 MG PO CAPS
100.0000 mg | ORAL_CAPSULE | Freq: Two times a day (BID) | ORAL | Status: DC
  Administered 2014-03-22 – 2014-03-27 (×10): 100 mg via ORAL
  Filled 2014-03-22 (×12): qty 1

## 2014-03-22 MED ORDER — METOCLOPRAMIDE HCL 5 MG/ML IJ SOLN
5.0000 mg | Freq: Three times a day (TID) | INTRAMUSCULAR | Status: DC | PRN
  Administered 2014-03-23 – 2014-03-27 (×8): 5 mg via INTRAVENOUS
  Filled 2014-03-22 (×8): qty 2

## 2014-03-22 MED ORDER — MORPHINE SULFATE 2 MG/ML IJ SOLN
2.0000 mg | INTRAMUSCULAR | Status: DC | PRN
  Administered 2014-03-22 – 2014-03-23 (×6): 4 mg via INTRAVENOUS
  Administered 2014-03-24 – 2014-03-26 (×3): 2 mg via INTRAVENOUS
  Filled 2014-03-22 (×4): qty 2
  Filled 2014-03-22 (×2): qty 1
  Filled 2014-03-22 (×2): qty 2
  Filled 2014-03-22: qty 1

## 2014-03-22 MED ORDER — MORPHINE SULFATE 4 MG/ML IJ SOLN
4.0000 mg | Freq: Once | INTRAMUSCULAR | Status: AC
  Administered 2014-03-22: 4 mg via INTRAVENOUS
  Filled 2014-03-22: qty 1

## 2014-03-22 MED ORDER — ELDERTONIC PO ELIX
15.0000 mL | ORAL_SOLUTION | Freq: Every day | ORAL | Status: DC
  Administered 2014-03-22 – 2014-03-27 (×6): 15 mL via ORAL
  Filled 2014-03-22 (×6): qty 15

## 2014-03-22 MED ORDER — SIMVASTATIN 20 MG PO TABS
20.0000 mg | ORAL_TABLET | Freq: Every day | ORAL | Status: DC
  Administered 2014-03-22 – 2014-03-27 (×6): 20 mg via ORAL
  Filled 2014-03-22 (×6): qty 1

## 2014-03-22 MED ORDER — PANTOPRAZOLE SODIUM 40 MG PO TBEC
40.0000 mg | DELAYED_RELEASE_TABLET | Freq: Every day | ORAL | Status: DC
  Administered 2014-03-22 – 2014-03-27 (×6): 40 mg via ORAL
  Filled 2014-03-22 (×6): qty 1

## 2014-03-22 MED ORDER — ONDANSETRON HCL 4 MG/2ML IJ SOLN
4.0000 mg | Freq: Four times a day (QID) | INTRAMUSCULAR | Status: DC | PRN
  Administered 2014-03-22 – 2014-03-23 (×2): 4 mg via INTRAVENOUS
  Filled 2014-03-22 (×3): qty 2

## 2014-03-22 MED ORDER — OXYCODONE HCL 5 MG PO TABS
10.0000 mg | ORAL_TABLET | ORAL | Status: DC | PRN
  Administered 2014-03-23 – 2014-03-27 (×13): 10 mg via ORAL
  Filled 2014-03-22 (×12): qty 2

## 2014-03-22 MED ORDER — LACTULOSE 10 GM/15ML PO SOLN
20.0000 g | Freq: Two times a day (BID) | ORAL | Status: DC | PRN
  Filled 2014-03-22: qty 30

## 2014-03-22 MED ORDER — ONDANSETRON HCL 4 MG PO TABS
4.0000 mg | ORAL_TABLET | Freq: Four times a day (QID) | ORAL | Status: DC | PRN
Start: 2014-03-22 — End: 2014-03-27
  Administered 2014-03-23 – 2014-03-24 (×4): 4 mg via ORAL
  Filled 2014-03-22 (×5): qty 1

## 2014-03-22 NOTE — Progress Notes (Signed)
[facility] PROGRESS NOTE  Lucas Green AST:[id number] DOB: 1932-02-16 DOA: 03/21/2014 PCP: Teressa Lower, MD  Assessment/Plan: 1. Metastatic prostate cancer. Case discussed with his medical oncologist Dr. Alen Blew who evaluated patient today. Patient will need followup at the [facility] to determine if he needs another hormonal agent as there is question whether he developed castration resistant disease. I discussed case with Dr. Lisbeth Renshaw of radiation oncology requested his opinion on the possibility of radiation therapy at this time. 2. Right hip pain. Hip x-ray performed on admission showing multifocal bone metastasis that involves the pelvis as well as progression of metastasis involving the right anterior iliac spine. There were pathologic fractures noted to the right superior and inferior pubic rami. Continue narcotic analgesics as needed as well as physical therapy consultation.  3. History of CVA. Currently stable, has residual left lower extremity weakness. Patient denies worsening focal neurological deficits. Continue aspirin 81 mg by mouth daily 4. Status post transcatheter aortic valve replacement. Residual performed on 01/14/2014. Currently stable, does not appear to have active cardiac issues. He last saw Dr. Roxy Manns on 02/17/2014 and an office visit.  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 He was diagnosed with adenocarcinoma, high volume Gleason on 07/21/2009 at which time had a PSA of 9.33. In 2013 had a bone scan which revealed several new foci of activity along the right pelvis suspicious for metastatic disease. In October 2010 he underwent radiation therapy  at the [facility]. Patient presented overnight with complaints of severe right hip pain progressively worsened over the past 3 weeks. After receiving narcotic analgesics, he reported significant improvement.  Objective: Filed Vitals:   03/22/14 1353  BP: 131/67  Pulse: 77  Temp:   Resp:     Intake/Output Summary (Last 24 hours) at 03/22/14 1512 Last data filed at 03/22/14 1126  Gross per 24 hour  Intake      0 ml  Output    580 ml  Net   -580 ml   There were no vitals filed for this visit.  Exam:   General:  Patient does not appear to be in acute distress, awake alert and oriented  Cardiovascular: Regular rate rhythm normal S1-S2  Respiratory: Clear to auscultation bilaterally  Abdomen: Soft nontender nondistended  Musculoskeletal: Patient having some pain with flexion of right lower extremity  Data Reviewed: Basic Metabolic Panel:  Recent Labs Lab 03/22/14 0144  NA 135*  K 4.2  CL 99  CO2 21  GLUCOSE 129*  BUN 29*  CREATININE 0.91  CALCIUM 9.3   Liver Function Tests: No results found for this basename: AST, ALT, ALKPHOS, BILITOT, PROT, ALBUMIN,  in the last 168 hours No results found for this basename: LIPASE, AMYLASE,  in the last 168 hours No results found for this basename: AMMONIA,  in the last 168 hours CBC:  Recent Labs Lab 03/22/14 0144  WBC 9.7  HGB 10.4*  HCT 30.4*  MCV 91.6  PLT 202   Cardiac Enzymes: No results found for this basename: CKTOTAL, CKMB, CKMBINDEX, TROPONINI,  in the last 168 hours BNP (last 3 results)  Recent Labs  12/30/13 2210 01/01/14 0449 01/19/14 0400  PROBNP 15574.0* 13190.0* 2105.0*   CBG: No results found for this basename: GLUCAP,  in the last 168 hours  No results found for this or any previous visit (from the past 240 hour(s)).   Studies: Dg Hip Complete Right  03/22/2014   CLINICAL DATA:  Worsening right hip pain  EXAM: RIGHT HIP - COMPLETE 2+ VIEW  COMPARISON:  01/06/2014  FINDINGS: Multi focal  bone metastasis are noted involving the bony pelvis bilaterally. Lesions are mixed lytic and sclerotic. Since the previous exam there has been progression of destructive bone changes involving the right anterior iliac spine. There are also pathologic fractures involving the right superior and inferior pubic rami. The right hip remains located. There are advanced changes of osteoarthritis.  IMPRESSION: Multi focal bone metastasis involving the pelvis.  Progression of metastasis involving the right anterior iliac spine  Pathologic fractures involving the right superior and inferior pubic rami.   Electronically Signed   By: Kerby Moors M.D.   On: 03/22/2014 01:17    Scheduled Meds: . aspirin  81 mg Oral Daily  . geriatric multivitamins-minerals  15 mL Oral Daily  . metoprolol succinate  50 mg Oral Q breakfast  . multivitamin with minerals  1 tablet Oral Daily  . pantoprazole  40 mg Oral Daily  . simvastatin  20 mg Oral Daily  . warfarin  6 mg Oral q1800  . Warfarin - Physician Dosing Inpatient   Does not apply q1800   Continuous Infusions: . sodium chloride      Principal Problem:   Intractable pain Active Problems:   Hypertension   Renal insufficiency   Prostate cancer with bone mets   Dyslipidemia   PAF (paroxysmal atrial fibrillation)   Chronic diastolic congestive heart failure   Pathologic pelvic fracture    Time spent: 35 minutes    [facility] Pager [phone]. If 7PM-7AM, please contact night-coverage at [email or web address], password [facility] 03/22/2014, 3:12 PM  LOS: 1 day

## 2014-03-22 NOTE — Progress Notes (Signed)
IP PROGRESS NOTE  Subjective:   Patient known to me with a history of advanced prostate cancer with metastatic disease to the bone. He is currently receiving androgen depravation without any clear cut evidence of castration resistant disease. He was hospitalized last 24 hours for progressive right hip pain. Plain film x-rays reviewed and showed multifocal bone metastasis and slight progression of the right anterior iliac spine metastasis. There is a pathological fracture of the right superior and inferior pubic rami. Clinically, he appears comfortable at this time and asymptomatic lay in bed. He is not reporting any chest pain or shortness of breath. He continues to receive cardiac rehabilitation for this hospitalization.  Objective:  Vital signs in last 24 hours: Temp:  [97.5 F (36.4 C)-97.8 F (36.6 C)] 97.5 F (36.4 C) (04/11 0607) Pulse Rate:  [78-92] 78 (04/11 1040) Resp:  [18] 18 (04/11 0607) BP: (124-176)/(55-92) 128/55 mmHg (04/11 1040) SpO2:  [95 %-100 %] 100 % (04/11 0607) Weight change:  Last BM Date: 03/21/14  Intake/Output from previous day:    Mouth: mucous membranes moist, pharynx normal without lesions Resp: clear to auscultation bilaterally Cardio: regular rate and rhythm, S1, S2 normal, no murmur, click, rub or gallop GI: soft, non-tender; bowel sounds normal; no masses,  no organomegaly Extremities: extremities normal, atraumatic, no cyanosis or edema  Portacath/PICC-without erythema  Lab Results:  Recent Labs  03/22/14 0144  WBC 9.7  HGB 10.4*  HCT 30.4*  PLT 202    BMET  Recent Labs  03/22/14 0144  NA 135*  K 4.2  CL 99  CO2 21  GLUCOSE 129*  BUN 29*  CREATININE 0.91  CALCIUM 9.3    Studies/Results: Dg Hip Complete Right  03/22/2014   CLINICAL DATA:  Worsening right hip pain  EXAM: RIGHT HIP - COMPLETE 2+ VIEW  COMPARISON:  01/06/2014  FINDINGS: Multi focal bone metastasis are noted involving the bony pelvis bilaterally. Lesions are  mixed lytic and sclerotic. Since the previous exam there has been progression of destructive bone changes involving the right anterior iliac spine. There are also pathologic fractures involving the right superior and inferior pubic rami. The right hip remains located. There are advanced changes of osteoarthritis.  IMPRESSION: Multi focal bone metastasis involving the pelvis.  Progression of metastasis involving the right anterior iliac spine  Pathologic fractures involving the right superior and inferior pubic rami.   Electronically Signed   By: Kerby Moors M.D.   On: 03/22/2014 01:17    Medications: I have reviewed the patient's current medications.  Assessment/Plan:  80 year old gentleman with the following issues:  1. Advanced prostate cancer with metastatic disease to the bone. He is currently receiving hormonal deprivation under the care of Lucas Green. It is unclear if you've developed castration resistant disease but certainly he is at risk of doing so. Upon his discharge from this hospitalization, we will arrange for him a followup at the [facility] as he missed his previous appointment to his cardiac surgery. Based on that evaluation, we'll determine whether he needs another hormonal agent such as Xtandi on [place].  2. Right hip pain: The abdominal films were reviewed and showed pathological fracture in the inferior and superior pubic rami. He received hip radiation in the past, quite sure of any further radiation can be done. From a management standpoint, I agree with pain management even if it requires narcotic analgesics as well as an evaluation by physical therapy. An opinion from radiation oncology will be helpful as  well regarding future radiation possibilities. I doubt an orthopedic intervention will be necessary at this time but it might be reasonable as well to get an opinion regarding those fractures.  3. Aortic valve replacement: He has no complications  from that at this time no evidence of heart.     LOS: 1 day   Lucas Green 03/22/2014, 1:03 PM

## 2014-03-22 NOTE — H&P (Signed)
[facility] History and Physical  Lucas Green ZOX:[id number] DOB: 01-18-1932 DOA: 03/21/2014  Referring physician: EDP PCP: Teressa Lower, MD  Specialists:   Chief Complaint:  Severe Right Hip Pain   HPI: Lucas Green is a 80 y.o. male with a history of Metastatic Prostate Cancer, Paroxsymal Atrial fibrillation on coumadin Rx, Chronic Diastolic CHF, HTN, and prior CVA who presents to the ED with complaints of intractable Right hip pain over the past 24 hours. He reports having increased pain for the past 2-3 weeks but the pain was unbearable for the past 24 hours.  He has not been able to stand on his right leg because of the pain .  He rates the pain at a 7/10 and describes the pain as dull and achy, The pain radiates down the right leg.  He took 800 mg of Ibuprofen at home without relief.   He denies any history of trauma or fall.  He was evaluated in the ED, and had X-rays of the Right hip performed which revealed Pathologic Fracture of the Superior and Inferior Rami and he was referred for medical admission.     Review of Systems:  Constitutional:  +Weight Loss, No Weight Gain, Night Sweats, Fevers, Chills, Fatigue, or +Generalized Weakness HEENT: No Headaches, Difficulty Swallowing,Tooth/Dental Problems,Sore Throat,  No Sneezing, Rhinitis, Ear Ache, Nasal Congestion, or Post Nasal Drip,  Cardio-vascular:  No Chest pain, Orthopnea, PND, Edema in lower extremities, Anasarca, Dizziness, Palpitations  Resp: No Dyspnea, No DOE, No Cough, No Hemoptysis, No Wheezing.    GI: No Heartburn, Indigestion, Abdominal Pain, Nausea, Vomiting, Diarrhea, Change in Bowel Habits,  Loss of Appetite  GU: No Dysuria, Change in Color of Urine, No Urgency or Frequency.  No flank pain.  Musculoskeletal: + Right Hip Pain,   +Decreased Range of Motion of Right Hip,  No Back Pain.  Neurologic: No Syncope, No Seizures, Muscle Weakness, Paresthesia, Vision Disturbance or Loss, No Diplopia, No Vertigo,  +Difficulty Walking,  Skin: No Rash or Lesions. Psych: No Change in Mood or Affect. No Depression or Anxiety. No Memory loss. No Confusion or Hallucinations   Past Medical History  Diagnosis Date  . Hypertension   . GERD (gastroesophageal reflux disease)   . Dyslipidemia   . Aortic stenosis   . Prostate cancer 09/2009  . 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 Hearing loss   . Incontinence of urine   . 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 Degenerative arthritis     osteoarthritis  . Stroke 03/29/13  . Hx of radiation therapy 09/30/13- 10/11/13    right hip/ischium, 3000 cGy 10 sessions  . Esophageal stricture   . Hiatal hernia   . Diverticulosis   . Moderate protein-calorie malnutrition 12/30/2013  . Failure to thrive 12/30/2013  . Pleural effusion, left 12/30/2013  . Severe aortic stenosis   . Atrial fibrillation   . Chronic diastolic congestive heart failure   . Acute on chronic diastolic heart failure   . S/P TAVR (transcatheter aortic valve replacement) 01/14/2014    26 mm Edwards Sapien XT transcatheter heart valve placed via direct aortic approach using right anterior mini-thoracotomy       Past Surgical History  Procedure Laterality Date  . Tonsillectomy  1938  . Cryosurgery prostate  09/2009    prostate cancer, Gleason 7  . Intraoperative transesophageal echocardiogram N/A 01/14/2014    Procedure: INTRAOPERATIVE TRANSESOPHAGEAL ECHOCARDIOGRAM;  Surgeon: Rexene Alberts, MD;  Location: [place];  Service: Open Heart Surgery;  Laterality: N/A;  . Transcatheter aortic valve replacement, transaortic N/A 01/14/2014    Procedure: TRANSCATHETER AORTIC VALVE REPLACEMENT, TRANSAORTIC;  Surgeon: Rexene Alberts, MD;  Location: [place];  Service: Open Heart Surgery;  Laterality: N/A;  . Chest tube insertion Left 01/14/2014    Procedure: CHEST TUBE INSERTION;  Surgeon: Rexene Alberts, MD;  Location: [place];  Service: Open Heart Surgery;  Laterality: Left;       Prior to Admission  medications   Medication Sig Start Date End Date Taking? Authorizing Provider  aspirin 81 MG chewable tablet Chew 1 tablet (81 mg total) by mouth daily. 01/20/14  Yes Donielle Liston Alba, PA-C  ibuprofen (ADVIL,MOTRIN) 800 MG tablet Take 800 mg by mouth every 8 (eight) hours as needed for pain.   Yes Historical Provider, MD  Iron-Vitamins (GERITOL) LIQD Take 1 Container by mouth daily.   Yes Historical Provider, MD  metoprolol succinate (TOPROL-XL) 50 MG 24 hr tablet Take 50 mg by mouth daily. Take with or immediately following a meal.   Yes Historical Provider, MD  Multiple Vitamin (MULTIVITAMIN WITH MINERALS) TABS Take 1 tablet by mouth daily.   Yes Historical Provider, MD  pantoprazole (PROTONIX) 40 MG tablet Take 40 mg by mouth daily. 05/10/13  Yes Historical Provider, MD  simvastatin (ZOCOR) 20 MG tablet Take 20 mg by mouth daily.   Yes Historical Provider, MD  warfarin (COUMADIN) 6 MG tablet Take 6 mg by mouth daily.   Yes Historical Provider, MD      Allergies  Allergen Reactions  . Other     Beer and Wine- swelling/headache/nausea     Social History:  Retired Engineer, drilling, Married  reports that he has never smoked. He has never used smokeless tobacco. He reports that he drinks alcohol. He reports that he does not use illicit drugs.     Family History  Problem Relation Age of Onset  . Heart disease Father   . Cancer Father     prostate  . Heart attack Father   . Cancer - Colon Mother   . Thyroid disease Mother   . Stroke Maternal Grandfather   . Hypertension Maternal Grandfather   . Stroke Paternal Grandfather   . Hypertension Paternal Grandfather   . Cancer - Lung Cousin        Physical Exam:  GEN:  Pleasant Elderly 80 y.o. Caucasian male examined and in no acute distress; cooperative with exam Filed Vitals:   03/21/14 2330 03/22/14 0045 03/22/14 0200 03/22/14 0229  BP: 176/78 144/65 154/62 151/67  Pulse: 92 86 85 88  Temp:      TempSrc:      Resp:    18  SpO2:  98% 95% 98% 98%   Blood pressure 151/67, pulse 88, temperature 97.8 F (36.6 C), temperature source Oral, resp. rate 18, SpO2 98.00%. PSYCH: He is alert and oriented x4; does not appear anxious does not appear depressed; affect is normal HEENT: Normocephalic and Atraumatic, Mucous membranes pink; PERRLA; EOM intact; Fundi:  Benign;  No scleral icterus, Nares: Patent, Oropharynx: Clear, Fair Dentition, Neck:  FROM, no cervical lymphadenopathy nor thyromegaly or carotid bruit; no JVD; Breasts:: Not examined CHEST WALL: No tenderness CHEST: Normal respiration, clear to auscultation bilaterally HEART: Regular rate and rhythm; no murmurs rubs or gallops BACK: No kyphosis or scoliosis; no CVA tenderness ABDOMEN: Positive Bowel Sounds, soft non-tender tympanic X 4; no masses, no organomegaly. Rectal Exam: Not done EXTREMITIES: No cyanosis, clubbing or edema;  no ulcerations. Genitalia: not examined PULSES: 2+ and symmetric SKIN: Normal hydration no rash or ulceration CNS:  Alert and Oriented x 4, No focal Deficits.    Vascular: pulses palpable throughout    Labs on Admission:  Basic Metabolic Panel:  Recent Labs Lab 03/22/14 0144  NA 135*  K 4.2  CL 99  CO2 21  GLUCOSE 129*  BUN 29*  CREATININE 0.91  CALCIUM 9.3   Liver Function Tests: No results found for this basename: AST, ALT, ALKPHOS, BILITOT, PROT, ALBUMIN,  in the last 168 hours No results found for this basename: LIPASE, AMYLASE,  in the last 168 hours No results found for this basename: AMMONIA,  in the last 168 hours CBC:  Recent Labs Lab 03/22/14 0144  WBC 9.7  HGB 10.4*  HCT 30.4*  MCV 91.6  PLT 202   Cardiac Enzymes: No results found for this basename: CKTOTAL, CKMB, CKMBINDEX, TROPONINI,  in the last 168 hours  BNP (last 3 results)  Recent Labs  12/30/13 2210 01/01/14 0449 01/19/14 0400  PROBNP 15574.0* 13190.0* 2105.0*   CBG: No results found for this basename: GLUCAP,  in the last 168  hours  Radiological Exams on Admission: Dg Hip Complete Right  03/22/2014   CLINICAL DATA:  Worsening right hip pain  EXAM: RIGHT HIP - COMPLETE 2+ VIEW  COMPARISON:  01/06/2014  FINDINGS: Multi focal bone metastasis are noted involving the bony pelvis bilaterally. Lesions are mixed lytic and sclerotic. Since the previous exam there has been progression of destructive bone changes involving the right anterior iliac spine. There are also pathologic fractures involving the right superior and inferior pubic rami. The right hip remains located. There are advanced changes of osteoarthritis.  IMPRESSION: Multi focal bone metastasis involving the pelvis.  Progression of metastasis involving the right anterior iliac spine  Pathologic fractures involving the right superior and inferior pubic rami.   Electronically Signed   By: Kerby Moors M.D.   On: 03/22/2014 01:17      Assessment/Plan:   80 y.o. male with  Principal Problem:   Intractable pain Active Problems:   Pathologic pelvic fracture   Hypertension   Renal insufficiency   Prostate cancer with bone mets   Dyslipidemia   PAF (paroxysmal atrial fibrillation)   Chronic diastolic congestive heart failure   Anemia   1.  Intractable Pain - Due to Pathologic Fxs of Right Superior and Inferior Rami.  Morphine 2-4 mg IV q 4 hrs PRN for Pain Control.   Bedrest for now.     2.  Pathologic Fracture of Pelvis-  Consult Radiation Oncology for evaluation to propose treatment options/plan.     3.  Prostate Cancer with Bone Metastasis-  Consult Oncology and Radiation Oncology.    3.  HTN- continue Metoprolol Rx.  Monitor BPs.     4.  Renal Insufficiency- Monitor BUN/Cr.    5.  Paroxysmal A.Fib- on Warfarin Rx, Monitor PT/INRs  6.  Chronic Diastolic CHF-  Monitor for S/Sxs of Volume Overload.     7.  Anemia- Normocytic, Monitor, and send Anemia Panel.        Code Status:    FULL CODE   Family Communication:    No Family Present Disposition  Plan:     Inpatient   Time spent:  [facility] [place] Pager [phone]  If 7PM-7AM, please contact night-coverage [email or web address] Password [facility] 03/22/2014, 4:17 AM

## 2014-03-23 DIAGNOSIS — Z954 Presence of other heart-valve replacement: Secondary | ICD-10-CM

## 2014-03-23 DIAGNOSIS — C7952 Secondary malignant neoplasm of bone marrow: Secondary | ICD-10-CM

## 2014-03-23 DIAGNOSIS — C61 Malignant neoplasm of prostate: Secondary | ICD-10-CM

## 2014-03-23 DIAGNOSIS — C7951 Secondary malignant neoplasm of bone: Secondary | ICD-10-CM

## 2014-03-23 DIAGNOSIS — M8440XA Pathological fracture, unspecified site, initial encounter for fracture: Secondary | ICD-10-CM

## 2014-03-23 DIAGNOSIS — Z8673 Personal history of transient ischemic attack (TIA), and cerebral infarction without residual deficits: Secondary | ICD-10-CM

## 2014-03-23 DIAGNOSIS — Z7901 Long term (current) use of anticoagulants: Secondary | ICD-10-CM

## 2014-03-23 DIAGNOSIS — E440 Moderate protein-calorie malnutrition: Secondary | ICD-10-CM

## 2014-03-23 LAB — PROTIME-INR
INR: 1.94 — AB (ref 0.00–1.49)
PROTHROMBIN TIME: 21.6 s — AB (ref 11.6–15.2)

## 2014-03-23 MED ORDER — WARFARIN - PHARMACIST DOSING INPATIENT
Freq: Every day | Status: DC
  Administered 2014-03-26: 18:00:00

## 2014-03-23 NOTE — Progress Notes (Addendum)
[facility] PROGRESS NOTE  Lucas Green CBJ:[id number] DOB: [birth year] DOA: 03/21/2014 PCP: Teressa Lower, MD  Assessment/Plan: 1. Metastatic prostate cancer.  -Case discussed with his medical oncologist Dr. Alen Blew   -Patient will need followup at the [facility] to determine if he needs another hormonal agent as there is question whether he developed castration resistant disease. -Radiation oncology consulted as he was evaluated by Dr. Lisbeth Renshaw -Rad/Onc recommending radiation therapy. Patient to be transferred to [facility] tomorrow morning to plan for radiation treatments. 2. Right hip pain.  -Hip x-ray performed on admission showing multifocal bone metastasis that involves the pelvis as well as progression of metastasis involving the right anterior iliac spine. There were pathologic fractures noted to the right superior and inferior pubic rami.  -Continue narcotic analgesics as needed as well as physical therapy  3. History of CVA.  -Currently stable, has residual left lower extremity weakness.  -Patient denies worsening focal neurological deficits.  -Continue aspirin 81 mg by mouth daily 4. Status post transcatheter aortic valve replacement.   -Performed on 01/14/2014.  -Currently stable, does not appear to have active cardiac issues.  -He last saw Dr. Roxy Manns on 02/17/2014 and an office visit. 5. Anticoagulation -Patient with h/o of PAF/CVA -Pharm consulted for coumadin dosing 5.  Constipation -Continue Colace and PRN lactulose 6.  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 He was diagnosed with adenocarcinoma, high volume Gleason on 07/21/2009 at which time had a PSA of 9.33. In 2013 had a bone scan which revealed several new foci of activity along the right pelvis suspicious for metastatic disease. In October 2010 he underwent radiation therapy at the [facility]. Patient presented overnight with complaints of severe right hip pain progressively worsened over the past 3 weeks. After receiving narcotic analgesics, he reported significant improvement.  Objective: Filed Vitals:   03/23/14 0920  BP: 124/58  Pulse: 83  Temp:   Resp:     Intake/Output Summary (Last 24 hours) at 03/23/14 1654 Last data filed at 03/23/14 1117  Gross per 24 hour  Intake      0 ml  Output    220 ml  Net   -220 ml   There were no vitals filed for this visit.  Exam:   General:  Patient does not appear to be in acute distress, awake alert and oriented  Cardiovascular: Regular rate rhythm normal S1-S2  Respiratory: Clear to auscultation bilaterally  Abdomen: Soft nontender nondistended  Musculoskeletal: Patient having some pain with flexion of right lower extremity  Data Reviewed: Basic Metabolic Panel:  Recent Labs Lab 03/22/14 0144  NA 135*  K 4.2  CL 99  CO2 21  GLUCOSE 129*  BUN 29*  CREATININE 0.91  CALCIUM 9.3   Liver Function Tests: No results found for this basename: AST, ALT, ALKPHOS, BILITOT, PROT, ALBUMIN,  in the last 168 hours No results found for this basename: LIPASE, AMYLASE,  in the last 168 hours No results found for this basename: AMMONIA,  in the last 168 hours CBC:  Recent Labs Lab 03/22/14 0144  WBC 9.7  HGB 10.4*  HCT 30.4*  MCV 91.6  PLT 202   Cardiac Enzymes: No results found for this basename: CKTOTAL, CKMB, CKMBINDEX, TROPONINI,  in the last 168 hours BNP (last 3 results)  Recent Labs  12/30/13 2210 01/01/14 0449 01/19/14 0400   PROBNP 15574.0* 13190.0* 2105.0*   CBG: No results found for this basename: GLUCAP,  in the last 168 hours  No results found for this or any previous visit (from the past 240 hour(s)).   Studies: Dg Hip Complete Right  03/22/2014   CLINICAL DATA:  Worsening right hip pain  EXAM: RIGHT HIP - COMPLETE 2+ VIEW  COMPARISON:  01/06/2014  FINDINGS: Multi focal bone metastasis are noted involving the bony pelvis bilaterally. Lesions are mixed lytic and sclerotic. Since the previous exam there has been progression of destructive bone changes involving the right anterior iliac spine. There are also pathologic fractures involving the right superior and inferior pubic rami. The right hip remains located. There are advanced changes of osteoarthritis.  IMPRESSION: Multi focal bone metastasis involving the pelvis.  Progression of metastasis involving the right anterior iliac spine  Pathologic fractures involving the right superior and inferior pubic rami.   Electronically Signed   By: Kerby Moors M.D.   On: 03/22/2014 01:17    Scheduled Meds: . aspirin  81 mg Oral Daily  . docusate sodium  100 mg Oral BID  . geriatric multivitamins-minerals  15 mL Oral Daily  . metoprolol succinate  50 mg Oral Q breakfast  . multivitamin with minerals  1 tablet Oral Daily  . pantoprazole  40 mg Oral Daily  . simvastatin  20 mg Oral Daily  . warfarin  6 mg Oral q1800  . Warfarin - Pharmacist Dosing Inpatient   Does not apply q1800   Continuous Infusions: . sodium chloride      Principal Problem:   Intractable pain Active Problems:   Hypertension   Renal insufficiency   Prostate cancer with bone mets   Dyslipidemia   PAF (paroxysmal atrial fibrillation)   Chronic diastolic congestive heart failure   Pathologic pelvic fracture    Time spent: 35 minutes    [place] [facility] Pager [phone]. If 7PM-7AM, please contact night-coverage at [email or web address], password [facility] 03/23/2014, 4:54 PM   LOS: 2 days

## 2014-03-23 NOTE — Evaluation (Signed)
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 Hip x-ray performed on admission showing multifocal bone metastasis that involves the pelvis as well as progression of metastasis involving the right anterior iliac spine. There were pathologic fractures noted to the right superior and inferior pubic rami. Continue narcotic analgesics  Clinical Impression  Pt admitted with above. Pt currently with functional limitations due to the deficits listed below (see PT Problem List).  Pt will benefit from skilled PT to increase their independence and safety with mobility to allow discharge to the venue listed below.       Follow Up Recommendations CIR Not sure how feasible CIR will be while undergoing radiation; but definitely worth considering    Equipment Recommendations  Wheelchair (measurements PT);Wheelchair cushion (measurements PT)    Recommendations for Other Services OT consult;Rehab consult     Precautions / Restrictions Precautions Precautions: Fall Restrictions Weight Bearing Restrictions: No (Activity OOB as tolerated)      Mobility  Bed Mobility Overal bed mobility: Needs Assistance Bed Mobility: Supine to Sit     Supine to sit: Mod assist     General bed mobility comments: Cues for technique, physical assist to ease bil LEs to EOB, and to elevate trunk from bed  Transfers Overall transfer level: Needs assistance Equipment used: Rolling walker (2 wheeled) Transfers: Sit to/from Stand Sit to Stand: Mod assist;+2 safety/equipment         General transfer comment: Cues fro hand placement and technique; Noted pt tending to bear down into RW to unweigh painful RLE; needing physical assist to rise, and to steady  RW  Ambulation/Gait Ambulation/Gait assistance: Min assist Ambulation Distance (Feet): 2 Feet (pivot steps bed to chair) Assistive device: Rolling walker (2 wheeled)       General Gait Details: short, shuffling steps; required physical assist to maneuver RW  Stairs            Wheelchair Mobility    Modified Rankin (Stroke Patients Only)       Balance Overall balance assessment: Needs assistance Sitting-balance support: Bilateral upper extremity supported Sitting balance-Leahy Scale: Poor   Postural control: Posterior lean Standing balance support: Bilateral upper extremity supported Standing balance-Leahy Scale: Fair Standing balance comment: Dependent on UE suport                             Pertinent Vitals/Pain 7/10 pain mostly R hip post transfer to chair patient repositioned for comfort elevated for edema and pain control     Home Living Family/patient expects to be discharged to:: Private residence Living Arrangements: Spouse/significant other Available Help at Discharge: Family (wife works M-F 8 hour days) Type of Home: House Home Access: Stairs to enter   [employer] of Steps: 1 Home Layout: Multi-level;Able to live on main level with bedroom/bathroom Home Equipment: Gilford Rile - 2 wheels;Cane - single point;Shower seat      Prior Function Level of Independence: Independent with assistive device(s)         Comments: using cane and RW for ambulation     Hand Dominance   Dominant Hand: Right    Extremity/Trunk Assessment   Upper Extremity Assessment: Generalized weakness           Lower Extremity Assessment:  RLE deficits/detail;LLE deficits/detail RLE Deficits / Details: Decr AROM and strength, limited by pain LLE Deficits / Details: Pt reports LLE is weak, effected by strokel did not formally MMT     Communication   Communication: No difficulties (though seems a bit HOH)  Cognition Arousal/Alertness:  Awake/alert Behavior During Therapy: WFL for tasks assessed/performed Overall Cognitive Status: Within Functional Limits for tasks assessed                      General Comments      Exercises        Assessment/Plan    PT Assessment Patient needs continued PT services  PT Diagnosis Difficulty walking;Abnormality of gait;Generalized weakness;Acute pain   PT Problem List Decreased strength;Decreased range of motion;Decreased activity tolerance;Decreased balance;Decreased mobility;Decreased coordination;Decreased knowledge of use of DME;Pain  PT Treatment Interventions DME instruction;Gait training;Stair training;Functional mobility training;Therapeutic activities;Therapeutic exercise;Balance training;Patient/family education   PT Goals (Current goals can be found in the Care Plan section) Acute Rehab PT Goals Patient Stated Goal: Is pretty focused on getting to [facility] for radiation PT Goal Formulation: With patient Time For Goal Achievement: 04/06/14 Potential to Achieve Goals: Good    Frequency Min 4X/week   Barriers to discharge Decreased caregiver support Pt will also likely be transferring to [facility] with plans to undergo Radiation    Co-evaluation               End of Session Equipment Utilized During Treatment: Gait belt Activity Tolerance: Patient tolerated treatment well Patient left: in chair;with call bell/phone within reach;with family/visitor present Nurse Communication: Mobility status         Time: 1610-9604 PT Time Calculation (min): 31 min   Charges:   PT Evaluation $Initial PT Evaluation Tier I: 1 Procedure PT Treatments $Gait Training: 8-22 mins $Therapeutic Activity: 8-22 mins   PT G Codes:          [facility] 03/23/2014, 4:20 PM Lucas Green, Lucas Green Pager [phone] Office [phone]

## 2014-03-23 NOTE — Progress Notes (Signed)
[place] for warfarin Indication: atrial fibrillation  Allergies  Allergen Reactions  . Other     Beer and Wine- swelling/headache/nausea    Patient Measurements:    Vital Signs: Temp: 99.3 F (37.4 C) (04/12 0420) Temp src: Oral (04/12 0420) BP: 124/58 mmHg (04/12 0920) Pulse Rate: 83 (04/12 0920)  Labs:  Recent Labs  03/22/14 0144 03/22/14 1855 03/23/14 0650  HGB 10.4*  --   --   HCT 30.4*  --   --   PLT 202  --   --   LABPROT  --  20.5* 21.6*  INR  --  1.82* 1.94*  CREATININE 0.91  --   --     The CrCl is unknown because both a height and weight (above a minimum accepted value) are required for this calculation.   Medical History: Past Medical History  Diagnosis Date  . Hypertension   . GERD (gastroesophageal reflux disease)   . Dyslipidemia   . Aortic stenosis   . Prostate cancer 09/2009  . 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 Hearing loss   . Incontinence of urine   . 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 Degenerative arthritis     osteoarthritis  . Stroke 03/29/13  . Hx of radiation therapy 09/30/13- 10/11/13    right hip/ischium, 3000 cGy 10 sessions  . Esophageal stricture   . Hiatal hernia   . Diverticulosis   . Moderate protein-calorie malnutrition 12/30/2013  . Failure to thrive 12/30/2013  . Pleural effusion, left 12/30/2013  . Severe aortic stenosis   . Atrial fibrillation   . Chronic diastolic congestive heart failure   . Acute on chronic diastolic heart failure   . S/P TAVR (transcatheter aortic valve replacement) 01/14/2014    26 mm Edwards Sapien XT transcatheter heart valve placed via direct aortic approach using right anterior mini-thoracotomy     Medications:  Prescriptions prior to admission  Medication Sig Dispense Refill  . aspirin 81 MG chewable tablet Chew 1 tablet (81 mg total) by mouth daily.      Marland Kitchen ibuprofen (ADVIL,MOTRIN) 800 MG tablet Take 800 mg by mouth every 8 (eight)  hours as needed for pain.      . Iron-Vitamins (GERITOL) LIQD Take 1 Container by mouth daily.      . metoprolol succinate (TOPROL-XL) 50 MG 24 hr tablet Take 50 mg by mouth daily. Take with or immediately following a meal.      . Multiple Vitamin (MULTIVITAMIN WITH MINERALS) TABS Take 1 tablet by mouth daily.      . pantoprazole (PROTONIX) 40 MG tablet Take 40 mg by mouth daily.      . simvastatin (ZOCOR) 20 MG tablet Take 20 mg by mouth daily.      Marland Kitchen warfarin (COUMADIN) 6 MG tablet Take 6 mg by mouth daily.        Assessment: 80 year old pt with metastatic prostate cancer, on warfarin PTA at a dose of 6 mg po daily for h/o of AFib.  INR is almost at goal- 1.94 today.  No bleeding problems noted.  Goal of Therapy:  INR 2-3 Monitor platelets by anticoagulation protocol: Yes   Plan:  Continue home dose of 6 mg po daily Daily INR   Hughes Better, PharmD, BCPS Clinical Pharmacist Pager: [phone] 03/23/2014 1:02 PM

## 2014-03-24 ENCOUNTER — Ambulatory Visit
Admit: 2014-03-24 | Discharge: 2014-03-24 | Disposition: A | Discharge status: Home / Self Care | Payer: Medicare Other | Attending: Radiation Oncology | Admitting: Radiation Oncology

## 2014-03-24 ENCOUNTER — Encounter (HOSPITAL_COMMUNITY): Payer: Self-pay | Admitting: *Deleted

## 2014-03-24 DIAGNOSIS — C61 Malignant neoplasm of prostate: Secondary | ICD-10-CM

## 2014-03-24 DIAGNOSIS — M8448XA Pathological fracture, other site, initial encounter for fracture: Principal | ICD-10-CM

## 2014-03-24 LAB — BASIC METABOLIC PANEL
BUN: 25 mg/dL — ABNORMAL HIGH (ref 6–23)
CO2: 22 mEq/L (ref 19–32)
Calcium: 8.9 mg/dL (ref 8.4–10.5)
Chloride: 93 mEq/L — ABNORMAL LOW (ref 96–112)
Creatinine, Ser: 0.95 mg/dL (ref 0.50–1.35)
GFR calc Af Amer: 89 mL/min — ABNORMAL LOW (ref >90)
GFR, EST NON AFRICAN AMERICAN: 77 mL/min — AB (ref >90)
GLUCOSE: 105 mg/dL — AB (ref 70–99)
Potassium: 4.6 mEq/L (ref 3.7–5.3)
SODIUM: 131 meq/L — AB (ref 137–147)

## 2014-03-24 LAB — CBC
HCT: 29.5 % — ABNORMAL LOW (ref 39.0–52.0)
Hemoglobin: 10 g/dL — ABNORMAL LOW (ref 13.0–17.0)
MCH: 31.2 pg (ref 26.0–34.0)
MCHC: 33.9 g/dL (ref 30.0–36.0)
MCV: 91.9 fL (ref 78.0–100.0)
PLATELETS: 244 10*3/uL (ref 150–400)
RBC: 3.21 MIL/uL — AB (ref 4.22–5.81)
RDW: 13.4 % (ref 11.5–15.5)
WBC: 10.4 10*3/uL (ref 4.0–10.5)

## 2014-03-24 LAB — PROTIME-INR
INR: 2.02 — ABNORMAL HIGH (ref 0.00–1.49)
Prothrombin Time: 22.2 s — ABNORMAL HIGH (ref 11.6–15.2)

## 2014-03-24 NOTE — Progress Notes (Signed)
Complex simulation/treatment planning note: The patient was taken to the CT simulator. A Vac lock immobilization device was constructed. His pelvis was scanned. An isocenter was chosen along the right pelvis. He was set up to AP and PA. 2 separate multileaf collimators were designed to conform the field. I prescribing 1500 cGy in 3 sessions utilizing 15 MV photons. An isodose plan is requested.

## 2014-03-24 NOTE — Progress Notes (Signed)
CC: Dr. Zola Button,  Dr. 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 He was seen in consultation by Dr. Lisbeth Renshaw as an inpatient yesterday for worsening right pelvic/hip pain, worse with weightbearing. I saw him this past October when he presented with pathologic fractures of the right superior and inferior pubic rami for which she underwent a two-week course of palliative radiotherapy (3000 cGy in 10 sessions) completing this on 10/11/2013. He had good palliation of his discomfort at that time. He continued with his androgen deprivation therapy. His last PSA back in September 2014 was 25.9, it and it is unclear as to whether not he has developed castrate resistant disease. He is beginning his PSAs through Dr. Gaynelle Arabian at [facility]. His last bone scan was 09/19/2013 showed uptake along the right pelvis including the previously described fractures along the superior and inferior right pubic rami and also anterior iliac spine. X-rays on admission to the ER showed multiple bony metastases, mixed lytic and sclerotic with progression of changes seen along the right anterior iliac spine along with pathologic fractures of the right superior and inferior pubic rami. He describes pain with movement of his right lower extremity, particularly with weightbearing. He denies low back pain. His pain occasionally radiates to the right lateral thigh. His pain is improved after starting narcotics. He was seen in consultation by Dr. Alen Blew as well.  Physical examination: Alert and oriented. Wt Readings from Last 3 Encounters:  03/24/14 192 lb 14.4 oz (87.5 kg)  03/06/14 192 lb (87.091 kg)  02/17/14 180 lb (81.647 kg)   Temp Readings from Last 3 Encounters:  03/24/14 98.3 F (36.8 C) Oral   01/20/14 97.8 F (36.6 C) Oral  01/20/14 97.8 F (36.6 C) Oral   BP Readings from Last 3 Encounters:  03/24/14 132/60  03/06/14 120/66  02/17/14 98/57   Pulse Readings from Last 3 Encounters:  03/24/14 86  03/06/14 75  02/17/14 70   There is no palpable lower spine discomfort. There is palpable discomfort along the right anterior iliac spine and also right pubic rami. Right hip range of motion is limited secondary to pain. Neurologic examination is unchanged.  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 Previously, he was treated to his right proximal femur/hip including his superior and inferior pubic rami. We can give him further radiotherapy to his right hip/pubic rami and also his anterior iliac spine. Even though he had a good palliative response last time, even with pathologic fractures, he is less likely respond as well with repeat treatment. Again, his major problem is an unstable pelvis secondary to his pathologic fractures. I suspect that his pathologic fractures of the right pubic rami are responsible for his significant degree of discomfort. I discussed the potential acute and late toxicities of radiation therapy. I want to give him 3 fractions of radiation therapy with him undergoing CT simulation today and beginning his treatment tomorrow and finishing all of his treatment by this Thursday, April 16. Consent is signed today. It is hoped  that he can remain hospitalized for his 3 day course of radiation therapy.  It would be quite uncomfortable for him to travel from home or a skilled nursing facility for daily radiation therapy.  Plan: As discussed above. He'll see Dr. Alen Blew for followup of his metastatic carcinoma in that he may be castrate resistant.  30 minutes was spent  face-to-face with the patient, counseling the patient and coordinating his care.

## 2014-03-24 NOTE — Progress Notes (Signed)
Rehab Admissions Coordinator Note:  Patient was screened by Retta Diones for appropriateness for an Inpatient Acute Rehab Consult.  At this time, we are recommending Inpatient Rehab consult.  Lucas Green 03/24/2014, 9:27 AM  I can be reached at [phone].

## 2014-03-24 NOTE — Progress Notes (Signed)
Notified Adrienne, RN for pt who is in room 5N17C that pt has bed assignment at [facility], room #1416 per bed placement. She verbalized understanding.

## 2014-03-24 NOTE — Progress Notes (Signed)
[place] for warfarin Indication: atrial fibrillation  Allergies  Allergen Reactions  . Other     Beer and Wine- swelling/headache/nausea    Patient Measurements:    Vital Signs: Temp: 98.2 F (36.8 C) (04/13 0615) BP: 150/68 mmHg (04/13 0615) Pulse Rate: 99 (04/13 0615)  Labs:  Recent Labs  03/22/14 0144 03/22/14 1855 03/23/14 0650 03/24/14 0431  HGB 10.4*  --   --  10.0*  HCT 30.4*  --   --  29.5*  PLT 202  --   --  244  LABPROT  --  20.5* 21.6* 22.2*  INR  --  1.82* 1.94* 2.02*  CREATININE 0.91  --   --  0.95    The CrCl is unknown because both a height and weight (above a minimum accepted value) are required for this calculation.   Medications:  Prescriptions prior to admission  Medication Sig Dispense Refill  . aspirin 81 MG chewable tablet Chew 1 tablet (81 mg total) by mouth daily.      Marland Kitchen ibuprofen (ADVIL,MOTRIN) 800 MG tablet Take 800 mg by mouth every 8 (eight) hours as needed for pain.      . Iron-Vitamins (GERITOL) LIQD Take 1 Container by mouth daily.      . metoprolol succinate (TOPROL-XL) 50 MG 24 hr tablet Take 50 mg by mouth daily. Take with or immediately following a meal.      . Multiple Vitamin (MULTIVITAMIN WITH MINERALS) TABS Take 1 tablet by mouth daily.      . pantoprazole (PROTONIX) 40 MG tablet Take 40 mg by mouth daily.      . simvastatin (ZOCOR) 20 MG tablet Take 20 mg by mouth daily.      Marland Kitchen warfarin (COUMADIN) 6 MG tablet Take 6 mg by mouth daily.        Assessment: 80 year old pt with metastatic prostate cancer, on warfarin PTA at a dose of 6 mg po daily for h/o of AFib.  INR at goal today with reading of 2.02..  No bleeding problems noted. Hgb and platelets have been stable.  Goal of Therapy:  INR 2-3 Monitor platelets by anticoagulation protocol: Yes   Plan:  1. Continue home dose of warfarin 6mg  po daily 2. Daily INR 3. Patient transferring to [facility] for radiation  treatments.  Jayzon Taras D. Kemisha Bonnette, PharmD, BCPS Clinical Pharmacist Pager: [phone] 03/24/2014 10:39 AM

## 2014-03-24 NOTE — Consult Note (Signed)
Radiation Oncology         ([phone]) [phone] ________________________________  Name: Lucas Green MRN: [id number]  Date: 03/21/2014  DOB: Jun 01, 1932    DIAGNOSIS:  Metastatic prostate cancer with intractable pain to the right hip  HISTORY OF PRESENT ILLNESS::Lucas Green is a 80 y.o. male who is seen for an initial consultation visit. The patient has a history of metastatic prostate cancer and currently is on androgen deprivation. He received palliative radiation treatment in our clinic through Dr. Valere Dross which consisted of 30 gray to the right hip region. The patient states that his pain significantly improved after this treatment which was completed on 10/11/2013. More recently however, the patient complains of worsening pain in the right hip over the last several weeks. This has included radiation to the right thigh. This significantly worse in on Friday where the patient was unable to bear weight. He therefore presented to the emergency room with the help of medical transport and a x-ray of the right hip was taken. This revealed multifocal bone metastasis within the pelvis including progression of metastasis involving the right anterior iliac spine in addition to pathologic fractures involving the right superior and inferior pubic rami.  The patient feels better with improved pain medicine management since being hospitalized. The patient took ibuprofen prior to coming to the hospital with incomplete relief although this didn't work much better several weeks ago. The patient has not been able to bear weight as of yet. I therefore have been asked to see the patient today for consideration of palliative radiation at this time. The patient also has been seen by Dr. Alen Blew we will see him as an outpatient to possibly consider additional systemic treatment. PSA levels have been obtained by urology and are not available in the medical record to me today.   PREVIOUS RADIATION THERAPY: Yes as above  to the right hip   PAST MEDICAL HISTORY:  has a past medical history of Hypertension; GERD (gastroesophageal reflux disease); Dyslipidemia; Aortic stenosis; Prostate cancer (09/2009); Metastasis from malignant tumor of prostate; Hearing loss; Incontinence of urine; Male impotence; Degenerative arthritis; Stroke (03/29/13); radiation therapy (09/30/13- 10/11/13); Esophageal stricture; Hiatal hernia; Diverticulosis; Moderate protein-calorie malnutrition (12/30/2013); Failure to thrive (12/30/2013); Pleural effusion, left (12/30/2013); Severe aortic stenosis; Atrial fibrillation; Chronic diastolic congestive heart failure; Acute on chronic diastolic heart failure; and 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     PAST SURGICAL HISTORY: Past Surgical History  Procedure Laterality Date  . Tonsillectomy  1938  . Cryosurgery prostate  09/2009    prostate cancer, Gleason 7  . Intraoperative transesophageal echocardiogram N/A 01/14/2014    Procedure: INTRAOPERATIVE TRANSESOPHAGEAL ECHOCARDIOGRAM;  Surgeon: Rexene Alberts, MD;  Location: [facility];  Service: Open Heart Surgery;  Laterality: N/A;  . Transcatheter aortic valve replacement, transaortic N/A 01/14/2014    Procedure: TRANSCATHETER AORTIC VALVE REPLACEMENT, TRANSAORTIC;  Surgeon: Rexene Alberts, MD;  Location: [facility];  Service: Open Heart Surgery;  Laterality: N/A;  . Chest tube insertion Left 01/14/2014    Procedure: CHEST TUBE INSERTION;  Surgeon: Rexene Alberts, MD;  Location: [facility];  Service: Open Heart Surgery;  Laterality: Left;     FAMILY HISTORY: family history includes Cancer in his father; Cancer - Colon in his mother; Cancer - Lung in his cousin; Heart attack in his father; Heart disease in his father; Hypertension in his maternal grandfather and paternal grandfather; Stroke in his maternal grandfather and paternal grandfather; Thyroid disease in his mother.   SOCIAL  HISTORY:  reports that he has never smoked. He has never  used smokeless tobacco. He reports that he drinks alcohol. He reports that he does not use illicit drugs.   ALLERGIES: Other   MEDICATIONS:  Current Facility-Administered Medications  Medication Dose Route Frequency Provider Last Rate Last Dose  . 0.9 %  sodium chloride infusion   Intravenous Continuous Theressa Millard, MD      . acetaminophen (TYLENOL) tablet 650 mg  650 mg Oral Q6H PRN Theressa Millard, MD   650 mg at 03/23/14 2831   Or  . acetaminophen (TYLENOL) suppository 650 mg  650 mg Rectal Q6H PRN Theressa Millard, MD      . alum & mag hydroxide-simeth (MAALOX/MYLANTA) 200-200-20 MG/5ML suspension 30 mL  30 mL Oral Q6H PRN Theressa Millard, MD      . aspirin chewable tablet 81 mg  81 mg Oral Daily Theressa Millard, MD   81 mg at 03/23/14 0920  . docusate sodium (COLACE) capsule 100 mg  100 mg Oral BID Kelvin Cellar, MD   100 mg at 03/23/14 2336  . geriatric multivitamins-minerals (ELDERTONIC/GEVRABON) elixir ELIX 15 mL  15 mL Oral Daily Theressa Millard, MD   15 mL at 03/23/14 0920  . lactulose (CHRONULAC) 10 GM/15ML solution 20 g  20 g Oral BID PRN Kelvin Cellar, MD      . metoCLOPramide (REGLAN) injection 5 mg  5 mg Intravenous Q8H PRN Kelvin Cellar, MD   5 mg at 03/23/14 1510  . metoprolol succinate (TOPROL-XL) 24 hr tablet 50 mg  50 mg Oral Q breakfast Theressa Millard, MD   50 mg at 03/23/14 0920  . morphine 2 MG/ML injection 2-4 mg  2-4 mg Intravenous Q4H PRN Theressa Millard, MD   2 mg at 03/24/14 0636  . multivitamin with minerals tablet 1 tablet  1 tablet Oral Daily Theressa Millard, MD   1 tablet at 03/23/14 0920  . ondansetron (ZOFRAN) tablet 4 mg  4 mg Oral Q6H PRN Theressa Millard, MD   4 mg at 03/23/14 2313   Or  . ondansetron (ZOFRAN) injection 4 mg  4 mg Intravenous Q6H PRN Theressa Millard, MD   4 mg at 03/23/14 0019  . oxyCODONE (Oxy IR/ROXICODONE) immediate release tablet 10 mg  10 mg Oral Q4H PRN Kelvin Cellar, MD   10 mg at  03/24/14 0529  . pantoprazole (PROTONIX) EC tablet 40 mg  40 mg Oral Daily Theressa Millard, MD   40 mg at 03/23/14 0920  . simvastatin (ZOCOR) tablet 20 mg  20 mg Oral Daily Theressa Millard, MD   20 mg at 03/23/14 0920  . warfarin (COUMADIN) tablet 6 mg  6 mg Oral q1800 Theressa Millard, MD   6 mg at 03/23/14 1829  . Warfarin - Pharmacist Dosing Inpatient   Does not apply [place], MD         REVIEW OF SYSTEMS:  A 15 point review of systems is documented in the electronic medical record. This was obtained by the nursing staff. However, I reviewed this with the patient to discuss relevant findings and make appropriate changes.  Pertinent items are noted in HPI.    PHYSICAL EXAM:  oral temperature is 98.2 F (36.8 C). His blood pressure is 150/68 and his pulse is 99. His respiration is 16 and oxygen saturation is 96%.   ECOG = 3 currently due to significant pain in the right  hip. He thought 0-1 prior to this acute episode.  0 - Asymptomatic (Fully active, able to carry on all predisease activities without restriction)  1 - Symptomatic but completely ambulatory (Restricted in physically strenuous activity but ambulatory and able to carry out work of a light or sedentary nature. For example, light housework, office work)  2 - Symptomatic, <50% in bed during the day (Ambulatory and capable of all self care but unable to carry out any work activities. Up and about more than 50% of waking hours)  3 - Symptomatic, >50% in bed, but not bedbound (Capable of only limited self-care, confined to bed or chair 50% or more of waking hours)  4 - Bedbound (Completely disabled. Cannot carry on any self-care. Totally confined to bed or chair)  5 - Death   Eustace Pen MM, Creech RH, Tormey DC, et al. [phone]). "Toxicity and response criteria of the [facility]". [place] Oncol. 5 (6): 649-55  General: Well-developed, in no acute distress HEENT: Normocephalic,  atraumatic Cardiovascular: Regular rate and rhythm Respiratory: Clear to auscultation bilaterally GI: Soft, nontender, normal bowel sounds Extremities: No edema present    LABORATORY DATA:  Lab Results  Component Value Date   WBC 10.4 03/24/2014   HGB 10.0* 03/24/2014   HCT 29.5* 03/24/2014   MCV 91.9 03/24/2014   PLT 244 03/24/2014   Lab Results  Component Value Date   NA 131* 03/24/2014   K 4.6 03/24/2014   CL 93* 03/24/2014   CO2 22 03/24/2014   Lab Results  Component Value Date   ALT 51 01/19/2014   AST 64* 01/19/2014   ALKPHOS 284* 01/19/2014   BILITOT 0.3 01/19/2014      RADIOGRAPHY: Dg Hip Complete Right  03/22/2014   CLINICAL DATA:  Worsening right hip pain  EXAM: RIGHT HIP - COMPLETE 2+ VIEW  COMPARISON:  01/06/2014  FINDINGS: Multi focal bone metastasis are noted involving the bony pelvis bilaterally. Lesions are mixed lytic and sclerotic. Since the previous exam there has been progression of destructive bone changes involving the right anterior iliac spine. There are also pathologic fractures involving the right superior and inferior pubic rami. The right hip remains located. There are advanced changes of osteoarthritis.  IMPRESSION: Multi focal bone metastasis involving the pelvis.  Progression of metastasis involving the right anterior iliac spine  Pathologic fractures involving the right superior and inferior pubic rami.   Electronically Signed   By: Kerby Moors M.D.   On: 03/22/2014 01:17       IMPRESSION: The patient has a history of metastatic prostate cancer with worsening pain in the right hip region. The patient to me today describes pain in both in the region of the pubic rami on the right in addition to more superiorly where progression was seen in the right anterior iliac spine. In reviewing his prior treatment, he'll receive palliative radiation to these lower areas in the pubic rami region extending more laterally as well. However it did not appear that treatment  extended to the more superior area of progression seen on recent x-ray. I therefore believe that the patient wasn't be a candidate for additional palliative radiation treatment at least to the more superior metastasis. Given the prior dose, additional radiation treatment could be considered more inferiorly as well, although I am worried that this represents potentially more structural change rather than true disease progression at this location. Obtaining his PSA levels may be helpful to see where he stands in terms of  an overall perspective. The patient feels that his PSA level has been going up and he does not note any specific details.     PLAN: The patient will be transferred to [facility] Monday morning such that we can proceed with simulation to the right pelvis. I will contact Dr. Valere Dross who treated him previously. The patient is complaining of pain more superiorly to an area that was not treated before so I believe we can at least treat that area. I will defer to Dr. Valere Dross on treatment more comprehensively to the right hip region as well. A comment  fractionation scheme would be 2 weeks of treatment. It will be important to see how the patient improves in terms of ambulation to help determine how best his treatment can be delivered in terms of logistics.   ________________________________   Jodelle Gross, MD, PhD

## 2014-03-24 NOTE — Progress Notes (Signed)
[facility] PROGRESS NOTE  EDDISON SEARLS YPP:[id number] DOB: 23-Oct-1932 DOA: 03/21/2014 PCP: Teressa Lower, MD  Interim Summary Patient is a pleasant 80 year old gentleman with a past medical history of metastatic prostate cancer diagnosed in 2010, currently on hormonal therapy, undergoing palliative radiation therapy at the [facility] administered by Dr. Valere Dross of medical oncology that consisted of 30 grays to the right hip region. Patient completing treatment on 10/11/2013 with improvement to right hip pain. Patient was admitted to the medicine service on 03/22/2014 presenting with intractable pain to his right hip that have progressively worsened over the past 3 weeks. Initial hip x-ray revealed the presence of multifocal bone metastasis involving the pelvis as well as progression of metastasis involving right anterior iliac spine. There were pathologic fractures involving the right superior and inferior pubic rami. He was started on as needed narcotic analgesics for pain management receiving oxycodone 10 mg every 4 hours as needed along with morphine 2 mg IV for severe breakthrough pain. Patient was seen and evaluated by both Dr. Alen Blew of medical oncology and Dr. Lisbeth Renshaw of radiation oncology. Dr. Lisbeth Renshaw recommended transferring the patient to [facility] hospital on Monday morning to proceed with planning of radiation to right hip. I discussed case with Dr. Rockne Menghini who agreed to accept patient in transfer. On this morning's evaluation he reports feeling better, sitting up having his breakfast. He had episode of N/V overnight which he attributed to narcotic analgesics. Transfer pending at the time of this dictation.                                                                                                                                                                                                                                                                                                                                                     Assessment/Plan: 1. Metastatic prostate cancer.  -  Case discussed with his medical oncologist Dr. Alen Blew   -Patient will need followup at the [facility] to determine if he needs another hormonal agent as there is question whether he developed castration resistant disease. -Radiation oncology consulted as he was evaluated by Dr. Lisbeth Renshaw -Rad/Onc recommending radiation therapy. Patient to be transferred to [facility] tomorrow morning to plan for radiation treatments. 2. Right hip pain.  -Hip x-ray performed on admission showing multifocal bone metastasis that involves the pelvis as well as progression of metastasis involving the right anterior iliac spine. There were pathologic fractures noted to the right superior and inferior pubic rami.  -Continue narcotic analgesics as needed as well as physical therapy  3. History of CVA.  -Currently stable, has residual left lower extremity weakness.  -Patient denies worsening focal neurological deficits.  -Continue aspirin 81 mg by mouth daily 4. Status post transcatheter aortic valve replacement.   -Performed on 01/14/2014.  -Currently stable, does not appear to have active cardiac issues.  -He last saw Dr. Roxy Manns on 02/17/2014 and an office visit. 5. Anticoagulation -Patient with h/o of PAF/CVA -Pharm consulted for coumadin dosing -Continue following daily PT/INR 5.  Constipation -Continue Colace and PRN lactulose 6.  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 He was diagnosed with  adenocarcinoma, high volume Gleason on 07/21/2009 at which time had a PSA of 9.33. In 2013 had a bone scan which revealed several new foci of activity along the right pelvis suspicious for metastatic disease. In October 2010 he underwent radiation therapy at the [facility]. Patient presented overnight with complaints of severe right hip pain progressively worsened over the past 3 weeks. After receiving narcotic analgesics, he reported significant improvement.  Objective: Filed Vitals:   03/24/14 0615  BP: 150/68  Pulse: 99  Temp: 98.2 F (36.8 C)  Resp: 16    Intake/Output Summary (Last 24 hours) at 03/24/14 0758 Last data filed at 03/24/14 5366  Gross per 24 hour  Intake    240 ml  Output    620 ml  Net   -380 ml   There were no vitals filed for this visit.  Exam:   General:  Patient does not appear to be in acute distress, awake alert and oriented  Cardiovascular: Regular rate rhythm normal S1-S2  Respiratory: Clear to auscultation bilaterally  Abdomen: Soft nontender nondistended  Musculoskeletal: Patient having some pain with flexion of right lower extremity  Data Reviewed: Basic Metabolic Panel:  Recent Labs Lab 03/22/14 0144 03/24/14 0431  NA 135* 131*  K 4.2 4.6  CL 99 93*  CO2 21 22  GLUCOSE 129* 105*  BUN 29* 25*  CREATININE 0.91 0.95  CALCIUM 9.3 8.9   Liver Function Tests: No results found for this basename: AST, ALT, ALKPHOS, BILITOT, PROT, ALBUMIN,  in the last 168 hours No results found for this basename: LIPASE, AMYLASE,  in the last 168 hours No results found for this basename: AMMONIA,  in the last 168 hours CBC:  Recent Labs Lab 03/22/14 0144 03/24/14 0431  WBC 9.7 10.4  HGB 10.4* 10.0*  HCT 30.4* 29.5*  MCV 91.6 91.9  PLT 202 244   Cardiac Enzymes: No results found for this basename: CKTOTAL, CKMB, CKMBINDEX, TROPONINI,  in the last 168 hours BNP (last 3 results)  Recent Labs  12/30/13 2210 01/01/14 0449 01/19/14 0400   PROBNP 15574.0* 13190.0* 2105.0*   CBG: No results found for this basename: GLUCAP,  in the last 168 hours  No results found for this or any previous visit (from the past 240 hour(s)).   Studies: No results found.  Scheduled Meds: . aspirin  81 mg Oral Daily  . docusate sodium  100 mg Oral BID  . geriatric multivitamins-minerals  15 mL Oral Daily  . metoprolol succinate  50 mg Oral Q breakfast  . multivitamin with minerals  1 tablet Oral Daily  . pantoprazole  40 mg Oral Daily  . simvastatin  20 mg Oral Daily  . warfarin  6 mg Oral q1800  . Warfarin - Pharmacist Dosing Inpatient   Does not apply q1800   Continuous Infusions: . sodium chloride      Principal Problem:   Intractable pain Active Problems:   Hypertension   Renal insufficiency   Prostate cancer with bone mets   Dyslipidemia   PAF (paroxysmal atrial fibrillation)   Chronic diastolic congestive heart failure   Pathologic pelvic fracture    Time spent: 35 minutes    [facility] Pager [phone]. If 7PM-7AM, please contact night-coverage at [email or web address], password [email or web address] 03/24/2014, 7:58 AM  LOS: 3 days

## 2014-03-25 ENCOUNTER — Ambulatory Visit
Admit: 2014-03-25 | Discharge: 2014-03-25 | Disposition: A | Discharge status: Home / Self Care | Payer: Medicare Other | Attending: Radiation Oncology | Admitting: Radiation Oncology

## 2014-03-25 DIAGNOSIS — C61 Malignant neoplasm of prostate: Secondary | ICD-10-CM

## 2014-03-25 DIAGNOSIS — I5033 Acute on chronic diastolic (congestive) heart failure: Secondary | ICD-10-CM

## 2014-03-25 LAB — PROTIME-INR
INR: 2.02 — ABNORMAL HIGH (ref 0.00–1.49)
Prothrombin Time: 22.2 seconds — ABNORMAL HIGH (ref 11.6–15.2)

## 2014-03-25 MED ORDER — POLYETHYLENE GLYCOL 3350 17 G PO PACK
17.0000 g | PACK | Freq: Two times a day (BID) | ORAL | Status: DC
  Administered 2014-03-25 – 2014-03-27 (×5): 17 g via ORAL
  Filled 2014-03-25 (×6): qty 1

## 2014-03-25 NOTE — Progress Notes (Signed)
[facility] PROGRESS NOTE  Lucas Green VOH:[id number] DOB: 1932-02-26 DOA: 03/21/2014 PCP: Teressa Lower, MD  Brief narrative: 80 year old gentleman with a past medical history of metastatic prostate cancer diagnosed in 2010, currently on hormonal therapy, undergoing palliative radiation therapy at the cancer center (under the care of Dr. Valere Dross of radiation oncology). Patient completed treatment on 10/11/2013 with improvement in right hip pain. Patient was admitted to the medicine service on 03/22/2014 with intractable pain in right hip progressively worsening for past 3 weeks. Initial hip x-ray revealed the presence of multifocal bone metastasis involving the pelvis as well as progression of metastasis involving right anterior iliac spine. There were pathologic fractures involving the right superior and inferior pubic rami. He was started on as needed narcotic analgesics for pain management. Patient was seen and evaluated by both Dr. Alen Blew of medical oncology and Dr. Lisbeth Renshaw of radiation oncology. Dr. Lisbeth Renshaw recommended transferring the patient to [facility] long hospital on 03/24/2014 to proceed with planning RT.  Assessment/Plan:   Principal Problem: Right hip pain / pathologic fractures involving right superior and inferior pubic rami / metastatic prostate cancer.  - Pt to start RT on 03/26/2014. Radiation oncology and oncology following - current pain regimen includes morphine 2 mg IV every 4 hours IV PRN and oxycodone 10 mg PO every 4 hours PRN - on discharge, likely inpatient rehab  Active problems: History of CVA.  - aspirin for secondary stroke prevention  Status post transcatheter aortic valve replacement. Paroxysmal atrial fibrillation. - Performed on 01/14/2014.  - continue coumadin per pharmacy dosing - rate controlled with metoprolol Constipation  -Continue Colace and PRN lactulose; added miralax BID  Code Status: full code Family Communication: family not at the bedside   Disposition Plan: to rehab once stable  Robbie Lis, MD  [facility] Pager [phone]  If 7PM-7AM, please contact night-coverage [email or web address] Password [email or web address] 03/25/2014, 5:21 PM   LOS: 4 days   Consultants:  Radiation oncology  Inpatient rehab  Procedures:  RT  Antibiotics:  None   HPI/Subjective: Pain is 3/10 this am.  Objective: Filed Vitals:   03/24/14 2144 03/25/14 0027 03/25/14 0500 03/25/14 1422  BP: 154/69 117/53 126/59 140/62  Pulse: 85 84 88 88  Temp: 98.3 F (36.8 C) 99.7 F (37.6 C) 97.7 F (36.5 C) 97.7 F (36.5 C)  TempSrc: Axillary Oral Oral Oral  Resp:  16 16 18   Height:      Weight:      SpO2: 97% 97% 97% 98%    Intake/Output Summary (Last 24 hours) at 03/25/14 1721 Last data filed at 03/25/14 1535  Gross per 24 hour  Intake    600 ml  Output   2225 ml  Net  -1625 ml    Exam:   General:  Pt is alert, follows commands appropriately, not in acute distress  Cardiovascular: Regular rate and rhythm, S1/S2 appreciated, slight SEM appreciated with radiation to carotids  Respiratory: Clear to auscultation bilaterally, no wheezing, no crackles, no rhonchi  Abdomen: Soft, non tender, non distended, bowel sounds present, no guarding  Extremities: No edema, pulses DP and PT palpable bilaterally  Neuro: Grossly nonfocal  Data Reviewed: Basic Metabolic Panel:  Recent Labs Lab 03/22/14 0144 03/24/14 0431  NA 135* 131*  K 4.2 4.6  CL 99 93*  CO2 21 22  GLUCOSE 129* 105*  BUN 29* 25*  CREATININE 0.91 0.95  CALCIUM 9.3 8.9   Liver Function Tests: No results found for this basename: AST, ALT, ALKPHOS, BILITOT,  PROT, ALBUMIN,  in the last 168 hours No results found for this basename: LIPASE, AMYLASE,  in the last 168 hours No results found for this basename: AMMONIA,  in the last 168 hours CBC:  Recent Labs Lab 03/22/14 0144 03/24/14 0431  WBC 9.7 10.4  HGB 10.4* 10.0*  HCT 30.4* 29.5*  MCV 91.6 91.9  PLT 202 244    Cardiac Enzymes: No results found for this basename: CKTOTAL, CKMB, CKMBINDEX, TROPONINI,  in the last 168 hours BNP: No components found with this basename: POCBNP,  CBG: No results found for this basename: GLUCAP,  in the last 168 hours  No results found for this or any previous visit (from the past 240 hour(s)).   Studies: No results found.  Scheduled Meds: . aspirin  81 mg Oral Daily  . docusate sodium  100 mg Oral BID  . geriatric multivitamins-minerals  15 mL Oral Daily  . metoprolol succinate  50 mg Oral Q breakfast  . multivitamin   1 tablet Oral Daily  . pantoprazole  40 mg Oral Daily  . polyethylene glycol  17 g Oral BID  . simvastatin  20 mg Oral Daily  . warfarin  6 mg Oral q1800   Continuous Infusions: . sodium chloride

## 2014-03-25 NOTE — Progress Notes (Signed)
I have reviewed this note and agree with all findings. Kati Uilani [place], PT, DPT Pager: [phone]   

## 2014-03-25 NOTE — Progress Notes (Signed)
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 There were pathologic fractures noted to the right superior and inferior pubic rami.     PT Comments    Pt performed sit to stand transfer, encouraged to try walking a few steps with rolling walker to reduce weight bearing through R LE however, pt reported limited by pain. Pivoted to chair.  Performed seated exercises to pain tolerance, adjusted according to pain reported.    Follow Up Recommendations  CIR     Equipment Recommendations  Wheelchair (measurements PT);Wheelchair cushion (measurements PT)    Recommendations for Other Services       Precautions / Restrictions Precautions Precautions: Fall Restrictions Weight Bearing Restrictions: No (Activity OOB as tolerated per evaluating PT)    Mobility  Bed Mobility Overal bed mobility: Needs Assistance Bed Mobility: Supine to Sit     Supine to sit: Min assist;HOB elevated     General bed mobility comments: verbal cues for safety; used therapist hand to bring trunk upright, repositioned hips with bed pad  Transfers Overall transfer level: Needs assistance Equipment used: Rolling walker (2 wheeled) Transfers: Sit to/from [facility] Sit to Stand: Min assist Stand pivot transfers: Min guard       General transfer comment: verbal cues for safety and hand placement, technique to use RW and weight shifting to keep weight to tolerance on RLE  Ambulation/Gait             General Gait  Details: pt declined to ambulate, explained that therapist could bring chair behind him to allow him to sit when he needed a break but pt reported he needed to sit immediately   Stairs            Wheelchair Mobility    Modified Rankin (Stroke Patients Only)       Balance                                    Cognition Arousal/Alertness: Awake/alert Behavior During Therapy: WFL for tasks assessed/performed Overall Cognitive Status: Within Functional Limits for tasks assessed                      Exercises General Exercises - Lower Extremity Ankle Circles/Pumps: AROM;Both;20 reps;Seated Quad Sets: AROM;Both;10 reps Gluteal Sets: AROM;Both;10 reps;Seated Long Arc Quad: AROM;Both;10 reps;Seated Heel Slides: AAROM;Both;10 reps;Seated Hip ABduction/ADduction: AROM;Both;10 reps;Seated Hip Flexion/Marching: AROM;Both;5 reps;10 reps;Seated;Limitations Hip Flexion/Marching Limitations: limited by pain to 5 reps on R LE Other Exercises Other Exercises: Towel Squeeze; AROM; Both; 10 reps; Seated    General Comments        Pertinent Vitals/Pain While standing, pt reported pain 3-4.  Activities to tolerance, reduce range or repititions according to pt tolerance.  Discussed pain meds with nurse.    Home Living                      Prior Function            PT Goals (current goals can  now be found in the care plan section) Progress towards PT goals: Progressing toward goals    Frequency  Min 3X/week    PT Plan Frequency needs to be updated    Co-evaluation             End of Session Equipment Utilized During Treatment: Gait belt Activity Tolerance: Patient tolerated treatment well Patient left: in chair;with call bell/phone within reach;with nursing/sitter in room     Time: 0922-0948 PT Time Calculation (min): 26 min  Charges:  $Therapeutic Exercise: 8-22 mins $Therapeutic Activity: 8-22 mins                    G CodesJacqulyn Cane 10-Apr-2014, 12:37 PM Jacqulyn Cane SPT 04-10-2014

## 2014-03-25 NOTE — Progress Notes (Signed)
Inpatient Rehabilitation  I met with the patient at the bedside to discuss his post acute rehab options.  He believes he would like to come to CIR following his radiation but he voices concerns that he may not be able to tolerate intensive therapies.  I will follow his progress in therapies tomorrow   but share concerns over his activity  tolerance. Please call if questions.  [place] Admissions Coordinator Cell [phone] Office [phone]

## 2014-03-25 NOTE — Consult Note (Signed)
Physical Medicine and Rehabilitation Consult Reason for Consult: Pathologic fractures right pelvis.  Referring Physician: Dr. Charlies Silvers   HPI: Lucas Green is a 80 y.o. male is a 80 y.o. male with a history of Metastatic Prostate Cancer, Paroxsymal Atrial fibrillation on coumadin Rx, Chronic Diastolic CHF, HTN, and prior CVA who presents to the ED on 03/21/14 with reports of 2-3 week history of progressive right hip pain with inability to stand on RLE as well as intractable Right hip pain X 24 hours. X-rays of right  Hip revealed progressive destructive bone changes of right anterior iliac spine with pathologic fractures involving the right superior and inferior pubic rami. He was started on narcotics for pain management and Radiation Onc consulted for input. He was evaluated by Dr. Lisbeth Renshaw and  XRT  recommended for pain management--last treatment on 03/27/14. PT evaluation done and rehab team recommending CIR.    ROS  Past Medical History  Diagnosis Date  . Hypertension   . GERD (gastroesophageal reflux disease)   . Dyslipidemia   . Aortic stenosis   . Prostate cancer 09/2009  . 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 Hearing loss   . Incontinence of urine   . 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 Degenerative arthritis     osteoarthritis  . Stroke 03/29/13  . Hx of radiation therapy 09/30/13- 10/11/13    right hip/ischium, 3000 cGy 10 sessions  . Esophageal stricture   . Hiatal hernia   . Diverticulosis   . Moderate protein-calorie malnutrition 12/30/2013  . Failure to thrive 12/30/2013  . Pleural effusion, left 12/30/2013  . Severe aortic stenosis   . Atrial fibrillation   . Chronic diastolic congestive heart failure   . Acute on chronic diastolic heart failure   . 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 Tonsillectomy  1938  . Cryosurgery prostate  09/2009    prostate cancer, Gleason 7  . Intraoperative transesophageal echocardiogram N/A 01/14/2014    Procedure: INTRAOPERATIVE TRANSESOPHAGEAL ECHOCARDIOGRAM;  Surgeon: Rexene Alberts, MD;  Location: [place];  Service: Open Heart Surgery;  Laterality: N/A;  . Transcatheter aortic valve replacement, transaortic N/A 01/14/2014    Procedure: TRANSCATHETER AORTIC VALVE REPLACEMENT, TRANSAORTIC;  Surgeon: Rexene Alberts, MD;  Location: [place];  Service: Open Heart Surgery;  Laterality: N/A;  . Chest tube insertion Left 01/14/2014    Procedure: CHEST TUBE INSERTION;  Surgeon: Rexene Alberts, MD;  Location: [place];  Service: Open Heart Surgery;  Laterality: Left;    Family History  Problem Relation Age of Onset  . Heart disease Father   . Cancer Father     prostate  . Heart attack Father   . Cancer - Colon Mother   . Thyroid disease Mother   . Stroke Maternal Grandfather   . Hypertension Maternal Grandfather   . Stroke Paternal Grandfather   . Hypertension Paternal Grandfather   . Cancer - Lung Cousin     Social History:  Married. Independent with AD. Wife works days. Per reports he has never smoked. He has never used smokeless tobacco. Per reports that he drinks alcohol. He reports that he does not use illicit drugs.   Allergies  Allergen Reactions  . Other  Beer and Wine- swelling/headache/nausea    Medications Prior to Admission  Medication Sig Dispense Refill  . aspirin 81 MG chewable tablet Chew 1 tablet (81 mg total) by mouth daily.      Marland Kitchen ibuprofen (ADVIL,MOTRIN) 800 MG tablet Take 800 mg by mouth every 8 (eight) hours as needed for pain.      . Iron-Vitamins (GERITOL) LIQD Take 1 Container by mouth daily.      . metoprolol succinate (TOPROL-XL) 50 MG 24 hr tablet Take 50 mg by mouth daily. Take with or immediately following a meal.      . Multiple Vitamin (MULTIVITAMIN WITH MINERALS) TABS  Take 1 tablet by mouth daily.      . pantoprazole (PROTONIX) 40 MG tablet Take 40 mg by mouth daily.      . simvastatin (ZOCOR) 20 MG tablet Take 20 mg by mouth daily.      Marland Kitchen warfarin (COUMADIN) 6 MG tablet Take 6 mg by mouth daily.        Home: Home Living Family/patient expects to be discharged to:: Private residence Living Arrangements: Spouse/significant other Available Help at Discharge: Family (wife works M-F 8 hour days) Type of Home: House Home Access: Stairs to enter [employer] of Steps: 1 Home Layout: Multi-level;Able to live on main level with bedroom/bathroom Home Equipment: Gilford Rile - 2 wheels;Cane - single point;Shower seat  Functional History: Prior Function Level of Independence: Independent with assistive device(s) Comments: using cane and RW for ambulation Functional Status:  Mobility: Bed Mobility Overal bed mobility: Needs Assistance Bed Mobility: Supine to Sit Supine to sit: Mod assist General bed mobility comments: Cues for technique, physical assist to ease bil LEs to EOB, and to elevate trunk from bed Transfers Overall transfer level: Needs assistance Equipment used: Rolling walker (2 wheeled) Transfers: Sit to/from Stand Sit to Stand: Mod assist;+2 safety/equipment General transfer comment: Cues fro hand placement and technique; Noted pt tending to bear down into RW to unweigh painful RLE; needing physical assist to rise, and to steady RW Ambulation/Gait Ambulation/Gait assistance: Min assist Ambulation Distance (Feet): 2 Feet (pivot steps bed to chair) Assistive device: Rolling walker (2 wheeled) General Gait Details: short, shuffling steps; required physical assist to maneuver RW    ADL:    Cognition: Cognition Overall Cognitive Status: Within Functional Limits for tasks assessed Cognition Arousal/Alertness: Awake/alert Behavior During Therapy: WFL for tasks assessed/performed Overall Cognitive Status: Within Functional Limits for  tasks assessed  Blood pressure 126/59, pulse 88, temperature 97.7 F (36.5 C), temperature source Oral, resp. rate 16, height 6\' 2"  (1.88 m), weight 87.5 kg (192 lb 14.4 oz), SpO2 97.00%. Physical Exam  Results for orders placed during the hospital encounter of 03/21/14 (from the past 24 hour(s))  PROTIME-INR     Status: Abnormal   Collection Time    03/25/14  3:55 AM      Result Value Ref Range   Prothrombin Time 22.2 (*) 11.6 - 15.2 seconds   INR 2.02 (*) 0.00 - 1.49   No results found.  Assessment/Plan: Diagnosis: metastatic prostate cancer to the pelvis 1. Does the need for close, 24 hr/day medical supervision in concert with the patient's rehab needs make it unreasonable for this patient to be served in a less intensive setting? Yes 2. Co-Morbidities requiring supervision/potential complications: PAF, hx of CVA 2014, AVD, severe pain 3. Due to bladder management, bowel management, safety, skin/wound care, disease management, pain management and patient education, does the patient require 24 hr/day rehab nursing? Yes 4.  Does the patient require coordinated care of a physician, rehab nurse, PT (1-2 hrs/day, 5 days/week) and OT (1-2 hrs/day, 5 days/week) to address physical and functional deficits in the context of the above medical diagnosis(es)? Yes Addressing deficits in the following areas: balance, endurance, locomotion, strength, transferring, bowel/bladder control, bathing, dressing, grooming, toileting and psychosocial support 5. Can the patient actively participate in an intensive therapy program of at least 3 hrs of therapy per day at least 5 days per week? Yes 6. The potential for patient to make measurable gains while on inpatient rehab is excellent 7. Anticipated functional outcomes upon discharge from inpatient rehab are modified independent and supervision  with PT, modified independent and supervision with OT, n/a with SLP. 8. Estimated rehab length of stay to reach the  above functional goals is: 7 days 9. Does the patient have adequate social supports to accommodate these discharge functional goals? Yes 10. Anticipated D/C setting: Home 11. Anticipated post D/C treatments: [place] therapy 12. Overall Rehab/Functional Prognosis: excellent  RECOMMENDATIONS: This patient's condition is appropriate for continued rehabilitative care in the following setting: CIR Patient has agreed to participate in recommended program. Yes and Potentially Note that insurance prior authorization may be required for reimbursement for recommended care.  Comment: Rehab Admissions Coordinator to follow up.  Thanks,  Meredith Staggers, MD, Mellody Drown     03/25/2014

## 2014-03-25 NOTE — Progress Notes (Signed)
[place] for warfarin Indication: atrial fibrillation  Allergies  Allergen Reactions  . Other     Beer and Wine- swelling/headache/nausea    Patient Measurements: Height: 6\' 2"  (188 cm) Weight: 192 lb 14.4 oz (87.5 kg) IBW/kg (Calculated) : 82.2  Vital Signs: Temp: 97.7 F (36.5 C) (04/14 0500) Temp src: Oral (04/14 0500) BP: 126/59 mmHg (04/14 0500) Pulse Rate: 88 (04/14 0500)  Labs:  Recent Labs  03/23/14 0650 03/24/14 0431 03/25/14 0355  HGB  --  10.0*  --   HCT  --  29.5*  --   PLT  --  244  --   LABPROT 21.6* 22.2* 22.2*  INR 1.94* 2.02* 2.02*  CREATININE  --  0.95  --     Estimated Creatinine Clearance: 73.3 ml/min (by C-G formula based on Cr of 0.95).   Medications:  Prescriptions prior to admission  Medication Sig Dispense Refill  . aspirin 81 MG chewable tablet Chew 1 tablet (81 mg total) by mouth daily.      Marland Kitchen ibuprofen (ADVIL,MOTRIN) 800 MG tablet Take 800 mg by mouth every 8 (eight) hours as needed for pain.      . Iron-Vitamins (GERITOL) LIQD Take 1 Container by mouth daily.      . metoprolol succinate (TOPROL-XL) 50 MG 24 hr tablet Take 50 mg by mouth daily. Take with or immediately following a meal.      . Multiple Vitamin (MULTIVITAMIN WITH MINERALS) TABS Take 1 tablet by mouth daily.      . pantoprazole (PROTONIX) 40 MG tablet Take 40 mg by mouth daily.      . simvastatin (ZOCOR) 20 MG tablet Take 20 mg by mouth daily.      Marland Kitchen warfarin (COUMADIN) 6 MG tablet Take 6 mg by mouth daily.        Assessment: 80 year old pt with metastatic prostate cancer, on warfarin PTA at a dose of 6 mg po daily for h/o of AFib. Patient transferred to [facility] for radiation to continue warfarin per pharmacy dosing  INR therapeutic on home dose of 6mg  daily   No bleeding problems noted.   Hgb and platelets have been stable.  Goal of Therapy:  INR 2-3 Monitor platelets by anticoagulation protocol: Yes   Plan:  1. Continue  home dose of warfarin 6mg  po daily 2. Daily INR  Adrian Saran, PharmD, BCPS Pager [phone] 03/25/2014 1:04 PM

## 2014-03-25 NOTE — Progress Notes (Signed)
Simulation verification note: The patient underwent simulation verification for treatment to his right pelvis today. His isocenter was in good position and the multileaf collimators contoured the treatment volume appropriately.

## 2014-03-25 NOTE — Progress Notes (Signed)
Weekly Management Note:  Site: Right pelvis Current Dose:  500  cGy Projected Dose: 1500  cGy  Narrative: The patient is seen today for routine under treatment assessment. CBCT/MVCT images/port films were reviewed. The chart was reviewed.   He begins radiation therapy today. His treatment setup is excellent. His pain is reasonably well controlled.  Physical Examination: There were no vitals filed for this visit..  Weight:  . No change.  Impression: Radiation therapy started today and will finish on Thursday, April 16.  Plan: Continue radiation therapy as planned.

## 2014-03-26 ENCOUNTER — Ambulatory Visit
Admit: 2014-03-26 | Discharge: 2014-03-26 | Disposition: A | Discharge status: Home / Self Care | Payer: Medicare Other | Attending: Radiation Oncology | Admitting: Radiation Oncology

## 2014-03-26 DIAGNOSIS — I5043 Acute on chronic combined systolic (congestive) and diastolic (congestive) heart failure: Secondary | ICD-10-CM

## 2014-03-26 LAB — PROTIME-INR
INR: 2.18 — AB (ref 0.00–1.49)
Prothrombin Time: 23.6 seconds — ABNORMAL HIGH (ref 11.6–15.2)

## 2014-03-26 MED ORDER — HYDROMORPHONE HCL PF 1 MG/ML IJ SOLN
1.0000 mg | INTRAMUSCULAR | Status: DC | PRN
  Administered 2014-03-26 (×2): 1 mg via INTRAVENOUS
  Administered 2014-03-26 – 2014-03-27 (×2): 2 mg via INTRAVENOUS
  Filled 2014-03-26: qty 1
  Filled 2014-03-26: qty 2
  Filled 2014-03-26: qty 1
  Filled 2014-03-26: qty 2

## 2014-03-26 NOTE — Progress Notes (Signed)
Inpatient Rehabilitation  Pt. was not scheduled for  PT session today, however I have made contact with PT to ask if pt. can be seen in the am so we can determine his current activity tolerance level for possible rehab admission. If pt. proves to have adequate tolerance , he would need to have last radiation treatment prior to coming over to rehab.  My colleague Karene Fry will follow up in the am.  She can be reached at [phone].  Thank you,  Gerlean Ren PT Inpatient Rehab Admissions Coordinator Cell [phone] Office [phone]

## 2014-03-26 NOTE — Care Management Note (Signed)
Cm spoke with patient at the bedside concerning discharge planning. Pt has communicated with CM that he plans to transition to CIR for seven days. Cm spoke with Joaquim Nam [phone]  CIR, who states awaiting further physical therapy assessment of how pt can tolerate therapy. In previous assessment pt declined to ambulate. CIR rep to contact PT in hopes a therapist can re eval pt prior to pt's last scheduled radiation treatment on 4/16/5. Per CIR rep if patient unable to tolerate therapy SNF for appropriate rehab setting for pt. Will continue to follow.    Venita Lick Tristain Daily,MSN,RN [phone]

## 2014-03-26 NOTE — Progress Notes (Addendum)
[facility] PROGRESS NOTE  Lucas Green WNI:[id number] DOB: 09/25/1932 DOA: 03/21/2014 PCP: Teressa Lower, MD  Brief narrative: 80 year old gentleman with a past medical history of metastatic prostate cancer diagnosed in 2010, currently on hormonal therapy, undergoing palliative radiation therapy at the cancer center (under the care of Dr. Valere Dross of radiation oncology). Patient completed treatment on 10/11/2013 with improvement in right hip pain. Patient was admitted to the medicine service on 03/22/2014 with intractable pain in right hip progressively worsening for past 3 weeks. Initial hip x-ray revealed the presence of multifocal bone metastasis involving the pelvis as well as progression of metastasis involving right anterior iliac spine. There were pathologic fractures involving the right superior and inferior pubic rami. He was started on as needed narcotic analgesics for pain management. Patient was seen and evaluated by both Dr. Alen Blew of medical oncology and Dr. Lisbeth Renshaw of radiation oncology. Dr. Lisbeth Renshaw recommended transferring the patient to [facility] on 03/24/2014 to proceed with radiation therapy.   Assessment/Plan:   Principal Problem:  Right hip pain / pathologic fractures involving right superior and inferior pubic rami / metastatic prostate cancer.  - RT started on 03/26/2014 and will be completed 03/27/2014. Radiation oncology and oncology following  - Current pain regimen includes morphine 2 mg IV every 4 hours IV PRN and oxycodone 10 mg PO every 4 hours PRN. Change morphine to dilaudid for better pain control.  Active problems:  History of CVA.  - aspirin for secondary stroke prevention  Status post transcatheter aortic valve replacement. Paroxysmal atrial fibrillation.  - Performed on 01/14/2014.  - continue coumadin per pharmacy dosing  - rate controlled with metoprolol  Constipation  -Continue Colace and PRN lactulose; added miralax BID   Code Status: full  code  Family Communication: family not at the bedside  Disposition Plan: to rehab once stable   Consultants:  Radiation oncology  Inpatient rehab Procedures:  RT Antibiotics:  None    Robbie Lis, MD  [facility] Pager [phone]  If 7PM-7AM, please contact night-coverage [email or web address] Password [email or web address] 03/26/2014, 1:40 PM   LOS: 5 days    HPI/Subjective: Feels pain and lightheaded.  Objective: Filed Vitals:   03/25/14 2123 03/26/14 0214 03/26/14 0514 03/26/14 0955  BP: 121/50 116/55 107/60 113/46  Pulse: 88 89 87 88  Temp: 99.6 F (37.6 C) 98.7 F (37.1 C) 99.1 F (37.3 C) 99.4 F (37.4 C)  TempSrc: Oral Oral Oral Oral  Resp: 17 16 20 20   Height:      Weight:      SpO2: 99% 97% 98% 94%    Intake/Output Summary (Last 24 hours) at 03/26/14 1340 Last data filed at 03/26/14 0900  Gross per 24 hour  Intake      0 ml  Output   1400 ml  Net  -1400 ml    Exam:   General:  Pt is alert, follows commands appropriately, not in acute distress  Cardiovascular: Regular rate and rhythm, S1/S2 appreciated, slight SEM appreciated, radiates to carotids  Respiratory: Clear to auscultation bilaterally, no wheezing, no crackles, no rhonchi  Abdomen: Soft, non tender, non distended, bowel sounds present, no guarding  Extremities: No edema, pulses DP and PT palpable bilaterally  Neuro: Grossly nonfocal  Data Reviewed: Basic Metabolic Panel:  Recent Labs Lab 03/22/14 0144 03/24/14 0431  NA 135* 131*  K 4.2 4.6  CL 99 93*  CO2 21 22  GLUCOSE 129* 105*  BUN 29* 25*  CREATININE 0.91 0.95  CALCIUM 9.3 8.9  Liver Function Tests: No results found for this basename: AST, ALT, ALKPHOS, BILITOT, PROT, ALBUMIN,  in the last 168 hours No results found for this basename: LIPASE, AMYLASE,  in the last 168 hours No results found for this basename: AMMONIA,  in the last 168 hours CBC:  Recent Labs Lab 03/22/14 0144 03/24/14 0431  WBC 9.7 10.4  HGB 10.4*  10.0*  HCT 30.4* 29.5*  MCV 91.6 91.9  PLT 202 244   Cardiac Enzymes: No results found for this basename: CKTOTAL, CKMB, CKMBINDEX, TROPONINI,  in the last 168 hours BNP: No components found with this basename: POCBNP,  CBG: No results found for this basename: GLUCAP,  in the last 168 hours  No results found for this or any previous visit (from the past 240 hour(s)).   Studies: No results found.  Scheduled Meds: . aspirin  81 mg Oral Daily  . docusate sodium  100 mg Oral BID  . geriatric multivitamins-minerals  15 mL Oral Daily  . metoprolol succinate  50 mg Oral Q breakfast  . multivitamin with minerals  1 tablet Oral Daily  . pantoprazole  40 mg Oral Daily  . polyethylene glycol  17 g Oral BID  . simvastatin  20 mg Oral Daily  . warfarin  6 mg Oral q1800  . Warfarin - Pharmacist Dosing Inpatient   Does not apply q1800   Continuous Infusions: . sodium chloride

## 2014-03-26 NOTE — Progress Notes (Signed)
[place] for warfarin Indication: atrial fibrillation  Allergies  Allergen Reactions  . Other     Beer and Wine- swelling/headache/nausea    Patient Measurements: Height: 6\' 2"  (188 cm) Weight: 192 lb 14.4 oz (87.5 kg) IBW/kg (Calculated) : 82.2  Vital Signs: Temp: 99.4 F (37.4 C) (04/15 0955) Temp src: Oral (04/15 0955) BP: 113/46 mmHg (04/15 0955) Pulse Rate: 88 (04/15 0955)  Labs:  Recent Labs  03/24/14 0431 03/25/14 0355 03/26/14 0321  HGB 10.0*  --   --   HCT 29.5*  --   --   PLT 244  --   --   LABPROT 22.2* 22.2* 23.6*  INR 2.02* 2.02* 2.18*  CREATININE 0.95  --   --     Estimated Creatinine Clearance: 73.3 ml/min (by C-G formula based on Cr of 0.95).   Medications:  Prescriptions prior to admission  Medication Sig Dispense Refill  . aspirin 81 MG chewable tablet Chew 1 tablet (81 mg total) by mouth daily.      Marland Kitchen ibuprofen (ADVIL,MOTRIN) 800 MG tablet Take 800 mg by mouth every 8 (eight) hours as needed for pain.      . Iron-Vitamins (GERITOL) LIQD Take 1 Container by mouth daily.      . metoprolol succinate (TOPROL-XL) 50 MG 24 hr tablet Take 50 mg by mouth daily. Take with or immediately following a meal.      . Multiple Vitamin (MULTIVITAMIN WITH MINERALS) TABS Take 1 tablet by mouth daily.      . pantoprazole (PROTONIX) 40 MG tablet Take 40 mg by mouth daily.      . simvastatin (ZOCOR) 20 MG tablet Take 20 mg by mouth daily.      Marland Kitchen warfarin (COUMADIN) 6 MG tablet Take 6 mg by mouth daily.        Assessment: 80 year old pt with metastatic prostate cancer, on warfarin PTA at a dose of 6 mg po daily for h/o of AFib. Patient transferred to [facility] for radiation to continue warfarin per pharmacy dosing  INR therapeutic today at 2.18 on home dose of 6mg  daily   No bleeding problems noted.   Hgb and platelets have been stable. (no CBC since 4/13)  Goal of Therapy:  INR 2-3 Monitor platelets by anticoagulation  protocol: Yes   Plan:  1. Continue home dose of warfarin 6mg  po daily 2. Daily INR  Gerrit Halls, PharmD Pager [phone] 03/26/2014 11:20 AM

## 2014-03-26 NOTE — Progress Notes (Signed)
[facility] Therapy Treatment Record Phone [phone]   Radiation Therapy was administered to Lucas Green on: 03/26/2014  11:02 AM and was treatment # 2 out of a planned course of 3 treatments.

## 2014-03-27 ENCOUNTER — Encounter: Payer: Self-pay | Admitting: Radiation Oncology

## 2014-03-27 ENCOUNTER — Encounter (HOSPITAL_COMMUNITY): Payer: Self-pay | Admitting: Rehabilitation

## 2014-03-27 ENCOUNTER — Inpatient Hospital Stay (HOSPITAL_COMMUNITY): Payer: Medicare Other

## 2014-03-27 ENCOUNTER — Inpatient Hospital Stay (HOSPITAL_COMMUNITY)
Admission: RE | Admit: 2014-03-27 | Discharge: 2014-04-10 | DRG: 945 | Disposition: A | Discharge status: Skilled Nursing Facility | Payer: Medicare Other | Source: Intra-hospital | Attending: Physical Medicine & Rehabilitation | Admitting: Physical Medicine & Rehabilitation

## 2014-03-27 ENCOUNTER — Ambulatory Visit
Admit: 2014-03-27 | Discharge: 2014-03-27 | Disposition: A | Discharge status: Home / Self Care | Payer: Medicare Other | Attending: Radiation Oncology | Admitting: Radiation Oncology

## 2014-03-27 DIAGNOSIS — Z954 Presence of other heart-valve replacement: Secondary | ICD-10-CM

## 2014-03-27 DIAGNOSIS — Z5189 Encounter for other specified aftercare: Principal | ICD-10-CM

## 2014-03-27 DIAGNOSIS — E785 Hyperlipidemia, unspecified: Secondary | ICD-10-CM | POA: Diagnosis present

## 2014-03-27 DIAGNOSIS — R5381 Other malaise: Secondary | ICD-10-CM | POA: Diagnosis present

## 2014-03-27 DIAGNOSIS — I428 Other cardiomyopathies: Secondary | ICD-10-CM | POA: Diagnosis present

## 2014-03-27 DIAGNOSIS — Z8673 Personal history of transient ischemic attack (TIA), and cerebral infarction without residual deficits: Secondary | ICD-10-CM

## 2014-03-27 DIAGNOSIS — R630 Anorexia: Secondary | ICD-10-CM | POA: Diagnosis present

## 2014-03-27 DIAGNOSIS — I5033 Acute on chronic diastolic (congestive) heart failure: Secondary | ICD-10-CM

## 2014-03-27 DIAGNOSIS — M8448XA Pathological fracture, other site, initial encounter for fracture: Secondary | ICD-10-CM | POA: Diagnosis present

## 2014-03-27 DIAGNOSIS — Z8249 Family history of ischemic heart disease and other diseases of the circulatory system: Secondary | ICD-10-CM

## 2014-03-27 DIAGNOSIS — I4891 Unspecified atrial fibrillation: Secondary | ICD-10-CM | POA: Diagnosis present

## 2014-03-27 DIAGNOSIS — I5042 Chronic combined systolic (congestive) and diastolic (congestive) heart failure: Secondary | ICD-10-CM | POA: Diagnosis present

## 2014-03-27 DIAGNOSIS — Z7901 Long term (current) use of anticoagulants: Secondary | ICD-10-CM

## 2014-03-27 DIAGNOSIS — C7951 Secondary malignant neoplasm of bone: Secondary | ICD-10-CM | POA: Diagnosis present

## 2014-03-27 DIAGNOSIS — R5383 Other fatigue: Secondary | ICD-10-CM

## 2014-03-27 DIAGNOSIS — Z7982 Long term (current) use of aspirin: Secondary | ICD-10-CM

## 2014-03-27 DIAGNOSIS — Z823 Family history of stroke: Secondary | ICD-10-CM

## 2014-03-27 DIAGNOSIS — C7952 Secondary malignant neoplasm of bone marrow: Secondary | ICD-10-CM

## 2014-03-27 DIAGNOSIS — I509 Heart failure, unspecified: Secondary | ICD-10-CM | POA: Diagnosis present

## 2014-03-27 DIAGNOSIS — I10 Essential (primary) hypertension: Secondary | ICD-10-CM | POA: Diagnosis present

## 2014-03-27 DIAGNOSIS — K5900 Constipation, unspecified: Secondary | ICD-10-CM | POA: Diagnosis present

## 2014-03-27 DIAGNOSIS — C61 Malignant neoplasm of prostate: Secondary | ICD-10-CM | POA: Diagnosis present

## 2014-03-27 DIAGNOSIS — I959 Hypotension, unspecified: Secondary | ICD-10-CM | POA: Diagnosis present

## 2014-03-27 DIAGNOSIS — K219 Gastro-esophageal reflux disease without esophagitis: Secondary | ICD-10-CM | POA: Diagnosis present

## 2014-03-27 LAB — PROTIME-INR
INR: 2.27 — ABNORMAL HIGH (ref 0.00–1.49)
Prothrombin Time: 24.3 seconds — ABNORMAL HIGH (ref 11.6–15.2)

## 2014-03-27 MED ORDER — METHOCARBAMOL 500 MG PO TABS
500.0000 mg | ORAL_TABLET | Freq: Four times a day (QID) | ORAL | Status: DC | PRN
  Filled 2014-03-27: qty 1

## 2014-03-27 MED ORDER — POLYETHYLENE GLYCOL 3350 17 G PO PACK
17.0000 g | PACK | Freq: Two times a day (BID) | ORAL | Status: DC
  Administered 2014-03-27 – 2014-04-03 (×12): 17 g via ORAL
  Filled 2014-03-27 (×26): qty 1

## 2014-03-27 MED ORDER — ELDERTONIC PO ELIX: 15.0000 mL | ORAL_SOLUTION | Freq: Every day | ORAL | Status: AC

## 2014-03-27 MED ORDER — ASPIRIN 81 MG PO CHEW
81.0000 mg | CHEWABLE_TABLET | Freq: Every day | ORAL | Status: DC
  Administered 2014-03-28 – 2014-04-10 (×13): 81 mg via ORAL
  Filled 2014-03-27 (×13): qty 1

## 2014-03-27 MED ORDER — HYDROMORPHONE HCL 2 MG PO TABS
2.0000 mg | ORAL_TABLET | ORAL | Status: DC | PRN
  Administered 2014-03-28: 2 mg via ORAL
  Administered 2014-03-28 – 2014-03-30 (×3): 4 mg via ORAL
  Filled 2014-03-27 (×3): qty 1
  Filled 2014-03-27: qty 2
  Filled 2014-03-27: qty 1
  Filled 2014-03-27 (×2): qty 2
  Filled 2014-03-27: qty 1
  Filled 2014-03-27: qty 2

## 2014-03-27 MED ORDER — HYDROMORPHONE HCL PF 2 MG/ML IJ SOLN: 2.0000 mg | INTRAMUSCULAR | Status: DC | PRN

## 2014-03-27 MED ORDER — ONDANSETRON HCL 4 MG/2ML IJ SOLN: 4.0000 mg | Freq: Four times a day (QID) | INTRAMUSCULAR | Status: DC | PRN

## 2014-03-27 MED ORDER — FLEET ENEMA 7-19 GM/118ML RE ENEM: 1.0000 | ENEMA | Freq: Once | RECTAL | Status: AC | PRN

## 2014-03-27 MED ORDER — DOCUSATE SODIUM 100 MG PO CAPS
100.0000 mg | ORAL_CAPSULE | Freq: Two times a day (BID) | ORAL | Status: DC
  Administered 2014-03-27 – 2014-03-31 (×8): 100 mg via ORAL
  Filled 2014-03-27 (×10): qty 1

## 2014-03-27 MED ORDER — BISACODYL 10 MG RE SUPP
10.0000 mg | Freq: Every day | RECTAL | Status: DC | PRN
  Administered 2014-03-28 – 2014-03-31 (×2): 10 mg via RECTAL
  Filled 2014-03-27 (×3): qty 1

## 2014-03-27 MED ORDER — SIMVASTATIN 20 MG PO TABS
20.0000 mg | ORAL_TABLET | Freq: Every day | ORAL | Status: DC
Start: 2014-03-28 — End: 2014-04-10
  Administered 2014-03-28 – 2014-04-09 (×13): 20 mg via ORAL
  Filled 2014-03-27 (×14): qty 1

## 2014-03-27 MED ORDER — ACETAMINOPHEN 325 MG PO TABS: 650.0000 mg | ORAL_TABLET | Freq: Four times a day (QID) | ORAL | Status: DC | PRN

## 2014-03-27 MED ORDER — METOPROLOL SUCCINATE ER 50 MG PO TB24
50.0000 mg | ORAL_TABLET | Freq: Every day | ORAL | Status: DC
  Administered 2014-03-28 – 2014-04-10 (×14): 50 mg via ORAL
  Filled 2014-03-27 (×17): qty 1

## 2014-03-27 MED ORDER — ALUM & MAG HYDROXIDE-SIMETH 200-200-20 MG/5ML PO SUSP
30.0000 mL | Freq: Four times a day (QID) | ORAL | Status: DC | PRN
  Administered 2014-04-03 – 2014-04-09 (×3): 30 mL via ORAL
  Filled 2014-03-27 (×3): qty 30

## 2014-03-27 MED ORDER — ELDERTONIC PO ELIX
15.0000 mL | ORAL_SOLUTION | Freq: Every day | ORAL | Status: DC
  Administered 2014-03-28 – 2014-04-10 (×11): 15 mL via ORAL
  Filled 2014-03-27 (×15): qty 15

## 2014-03-27 MED ORDER — SENNOSIDES-DOCUSATE SODIUM 8.6-50 MG PO TABS
2.0000 | ORAL_TABLET | Freq: Every day | ORAL | Status: DC
  Administered 2014-03-27 – 2014-03-30 (×4): 2 via ORAL
  Filled 2014-03-27 (×4): qty 2

## 2014-03-27 MED ORDER — POLYETHYLENE GLYCOL 3350 17 G PO PACK: 17.0000 g | PACK | Freq: Two times a day (BID) | ORAL | Status: DC

## 2014-03-27 MED ORDER — ENSURE COMPLETE PO LIQD
237.0000 mL | Freq: Two times a day (BID) | ORAL | Status: DC
  Administered 2014-03-28 – 2014-04-09 (×18): 237 mL via ORAL

## 2014-03-27 MED ORDER — DSS 100 MG PO CAPS: 100.0000 mg | ORAL_CAPSULE | Freq: Two times a day (BID) | ORAL | Status: DC

## 2014-03-27 MED ORDER — LACTULOSE 10 GM/15ML PO SOLN: 20.0000 g | Freq: Two times a day (BID) | ORAL | Status: DC | PRN

## 2014-03-27 MED ORDER — ADULT MULTIVITAMIN W/MINERALS CH
1.0000 | ORAL_TABLET | Freq: Every day | ORAL | Status: DC
  Administered 2014-03-28 – 2014-04-10 (×11): 1 via ORAL
  Filled 2014-03-27 (×15): qty 1

## 2014-03-27 MED ORDER — PANTOPRAZOLE SODIUM 40 MG PO TBEC
40.0000 mg | DELAYED_RELEASE_TABLET | Freq: Every day | ORAL | Status: DC
Start: 2014-03-28 — End: 2014-04-10
  Administered 2014-03-28 – 2014-04-10 (×14): 40 mg via ORAL
  Filled 2014-03-27 (×14): qty 1

## 2014-03-27 MED ORDER — TRAZODONE HCL 50 MG PO TABS
25.0000 mg | ORAL_TABLET | Freq: Every evening | ORAL | Status: DC | PRN
  Administered 2014-04-06: 50 mg via ORAL
  Filled 2014-03-27 (×2): qty 1

## 2014-03-27 MED ORDER — ONDANSETRON HCL 4 MG PO TABS: 4.0000 mg | ORAL_TABLET | Freq: Four times a day (QID) | ORAL | Status: DC | PRN

## 2014-03-27 MED ORDER — WARFARIN SODIUM 6 MG PO TABS
6.0000 mg | ORAL_TABLET | Freq: Every day | ORAL | Status: DC
  Filled 2014-03-27: qty 1

## 2014-03-27 MED ORDER — DIPHENHYDRAMINE HCL 12.5 MG/5ML PO ELIX: 12.5000 mg | ORAL_SOLUTION | Freq: Four times a day (QID) | ORAL | Status: DC | PRN

## 2014-03-27 MED ORDER — WARFARIN - PHARMACIST DOSING INPATIENT: Freq: Every day | Status: DC

## 2014-03-27 MED ORDER — ONDANSETRON HCL 4 MG PO TABS
4.0000 mg | ORAL_TABLET | Freq: Four times a day (QID) | ORAL | Status: DC | PRN
  Administered 2014-03-28 – 2014-04-05 (×10): 4 mg via ORAL
  Filled 2014-03-27 (×11): qty 1

## 2014-03-27 MED ORDER — ACETAMINOPHEN 325 MG PO TABS: 325.0000 mg | ORAL_TABLET | ORAL | Status: DC | PRN

## 2014-03-27 MED ORDER — BOOST PLUS PO LIQD
237.0000 mL | Freq: Three times a day (TID) | ORAL | Status: DC
  Administered 2014-03-28 – 2014-04-09 (×27): 237 mL via ORAL
  Filled 2014-03-27 (×47): qty 237

## 2014-03-27 MED ORDER — ALUM & MAG HYDROXIDE-SIMETH 200-200-20 MG/5ML PO SUSP: 30.0000 mL | Freq: Four times a day (QID) | ORAL | Status: AC | PRN

## 2014-03-27 NOTE — Progress Notes (Signed)
Rehab admissions - I met with patient and his wife.  They are in agreement with acute inpatient rehab admission.  I spoke with MD and I have clearance to admit to acute inpatient rehab today.  Call me for questions.  #[phone]

## 2014-03-27 NOTE — Progress Notes (Signed)
ANTICOAGULATION CONSULT NOTE- Follow up  Pharmacy Consult for warfarin Indication: atrial fibrillation  Allergies  Allergen Reactions  . Other     Beer and Wine- swelling/headache/nausea    Patient Measurements: Height: 6\' 2"  (188 cm) Weight: 192 lb 14.4 oz (87.5 kg) IBW/kg (Calculated) : 82.2  Assessment: 32 yoM with metastatic prostate cancer admitted 4/11 with right hip pain. Patient on warfarin PTA for AFib. Patient transferred to [facility] on 4/14 for XRT treatments. Pharmacy has been consulted to continue warfarin while inpatient  Home warfarin dose 6 mg PO daily   INR today remains in therapeutic range at 2.27  Receiving home dose of 6mg  daily   No bleeding problems noted.   Hgb and platelets were stable on 4/13  Noted patient being evaluated for inpatient rehab  Goal of Therapy:  INR 2-3 Monitor platelets by anticoagulation protocol: Yes   Plan:  1. Continue home dose of warfarin 6mg  po daily 2. Daily INR 3. CBC in AM  Thank you for the consult.  Johny Drilling, PharmD, BCPS Pager: [phone] Pharmacy: [phone] 03/27/2014 11:27 AM

## 2014-03-27 NOTE — Progress Notes (Addendum)
[facility] End of Treatment Note  Name:Davison TAB RYLEE  Date: 03/27/2014 YME:[id number] DOB:04/01/1932   Status:inpatient    CC: Teressa Lower, MD  Dr. Zola Button, Dr. Katrine Coho  REFERRING PHYSICIAN: Dr. 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 He is now being transferred to [facility] rehabilitation.                          PLAN: Routine followup in one month. Patient instructed to call if questions or worsening complaints in interim.

## 2014-03-27 NOTE — Progress Notes (Signed)
I have reviewed this note and agree with all findings. Kati Latrece Nitta, PT, DPT Pager: [phone]   

## 2014-03-27 NOTE — Progress Notes (Signed)
Patient arrived onto Inpatient Rehab via [place].Introduced patient to rehab. Will continue to monitor patient.

## 2014-03-27 NOTE — Progress Notes (Signed)
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 There were pathologic fractures noted to the right superior and inferior pubic rami.     PT Comments    Pt mobility continues to be limited by pain and LE weakness.  Pt needs additional time to perform bed mobility but was able to pivot to chair after attempting a few shuffled steps.  Pt declined ambulation due to pain.  Pt able to perform exercises while seated for LE strengthening.  If CIR declines, pt will need SNF placement.  Follow Up Recommendations  CIR     Equipment Recommendations  Wheelchair (measurements PT);Wheelchair cushion (measurements PT)    Recommendations for Other Services       Precautions / Restrictions Precautions Precautions: Fall Restrictions Weight Bearing Restrictions: No (Activity OOB as tolerated per evaluating PT)    Mobility  Bed Mobility Overal bed mobility: Needs Assistance Bed Mobility: Supine to Sit     Supine to sit: Min assist;HOB elevated     General bed mobility comments: verbal cues for safety and sequence; used therapist hand to bring trunk upright, repositioned hips with bed pad; required significanlty increased time to complete and rest breaks between small bouts of movement due to pain  Transfers Overall transfer level: Needs assistance Equipment used: Rolling walker (2 wheeled) Transfers: Sit to/from Stand Sit to Stand: Min assist Stand pivot transfers: Min guard       General transfer comment: verbal cues for  safety and sequence; cues for weight shifting to allow easier standing and reduced weight bearing to R LE according to pain tolerance; poor eccentric control upon descent  Ambulation/Gait Ambulation/Gait assistance: Min guard   Assistive device: Rolling walker (2 wheeled) Gait Pattern/deviations: Decreased stride length;Shuffle;Antalgic     General Gait Details: attempted a few shuffled steps (step length <6 in) with walker to get to recliner; pt declined to ambulate even with chair available behind him for rest breaks; reported pain was too intense (5/10)   Stairs            Wheelchair Mobility    Modified Rankin (Stroke Patients Only)       Balance Overall balance assessment: Needs assistance Sitting-balance support: Bilateral upper extremity supported;Feet supported Sitting balance-Leahy Scale: Poor Sitting balance - Comments: pt had difficulty positioning self for balance; assisted reposition hips and pt was able to maintain balance with B UE support Postural control: Posterior lean Standing balance support: Bilateral upper extremity supported;During functional activity Standing balance-Leahy Scale: Fair Standing balance comment: heavy reliance on UE support                     Cognition Arousal/Alertness: Awake/alert Behavior During Therapy: WFL for tasks assessed/performed Overall Cognitive Status: Within Functional Limits for tasks assessed                      Exercises General Exercises - Lower Extremity Quad Sets: AROM;Both;10 reps;Seated;Other (comment) (performed seated with therapist resistance at 90 deg flexion) Hip ABduction/ADduction: AROM;Both;10 reps;Seated;Other (comment) (performed seated with therapist resistance at 90 deg flexion) Hip Flexion/Marching: AROM;Both;5  reps;Seated Toe Raises: AROM;Both;10 reps;Seated Heel Raises: AROM;Both;10 reps;Seated Other Exercises Other Exercises: Towel Squeeze; AROM; Both; 10 reps;  Seated Other Exercises: Hamstring Isometric; AROM; Both; 10 reps; Seated    General Comments        Pertinent Vitals/Pain Pain in R hip while standing reported 5/10.  Pt reported R shoulder pain from osteoarthritis.  Activity to tolerance.    Home Living                      Prior Function            PT Goals (current goals can now be found in the care plan section) Progress towards PT goals: Progressing toward goals    Frequency  Min 3X/week    PT Plan Current plan remains appropriate    Co-evaluation             End of Session Equipment Utilized During Treatment: Gait belt Activity Tolerance: Patient limited by pain;Patient limited by fatigue;Other (comment) (pt reported problem wtih fatigue due to PMH of CHF) Patient left: in chair;with call bell/phone within reach     Time: 0910-0938 PT Time Calculation (min): 28 min  Charges:                       G CodesJacqulyn Cane Apr 09, 2014, 10:05 AM Jacqulyn Cane SPT 04-09-14

## 2014-03-27 NOTE — PMR Pre-admission (Signed)
PMR Admission Coordinator Pre-Admission Assessment  Patient: Lucas Green is an 80 y.o., male MRN: [id number] DOB: Sep 08, 1932 Height: 6\' 2"  (188 cm) Weight: 87.5 kg (192 lb 14.4 oz)              Insurance Information HMO: No    PPO:       PCP:       IPA:       80/20:       OTHER:   PRIMARY: Medicare A/B      Policy#: [id number]      Subscriber: Elson Areas CM Name:        Phone#:       Fax#:   Pre-Cert#:        Employer: Retired Benefits:  Phone #:       Name: Checked in [place]. Date: A=03/13/1999 B=02/09/02     Deduct: $1260      Out of Pocket Max: none      Life Max: unlimited CIR: 100%      SNF: 100 days Outpatient: 80%     Co-Pay: 20% Home Health: 100%      Co-Pay: none DME: 80%     Co-Pay: 20% Providers: patient's choice  SECONDARY: [employer]SA      Policy#: [id number]      Subscriber: Elson Areas CM Name:        Phone#:       Fax#:   Pre-Cert#:        Employer: Retired Benefits:  Phone #: [phone]     Name:   Eff. Date:       Deduct:        Out of Pocket Max:        Life Max:   CIR:        SNF:   Outpatient:       Co-Pay:   Home Health:        Co-Pay:   DME:       Co-Pay:     Emergency Contact Information Contact Information   Name Relation Home Work Mobile   Hagerty,[name] Spouse [phone]     Current Medical History  Patient Admitting Diagnosis: Metastatic prostate cancer to the pelvis    History of Present Illness: A 80 y.o. male with a history of Metastatic Prostate Cancer, recent TVAR due to sever AVS, PAF- coumadin Rx, Chronic mixed CHF, prior CVA who presents to the ED on 03/21/14 with reports of 2-3 week history of progressive right hip pain with inability to stand on RLE as well as intractable Right hip pain X 24 hours. X-rays of right Hip revealed progressive destructive bone changes of right anterior iliac spine with pathologic fractures involving the right superior and inferior pubic rami. He was started on  narcotics for pain management and Radiation Onc consulted for input. He was evaluated by Dr. Lisbeth Renshaw and XRT recommended for pain management--last treatment today. He continues to have poor activity tolerance due to severe pain. CIR recommended by rehab team and patient to be admitted today.    Past Medical History  Past Medical History  Diagnosis Date  . Hypertension   . GERD (gastroesophageal reflux disease)   . Dyslipidemia   . Aortic stenosis   . Prostate cancer 09/2009  . 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 Hearing loss   . Incontinence of urine   . 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 Degenerative arthritis     osteoarthritis  .  Stroke 03/29/13  . Hx of radiation therapy 09/30/13- 10/11/13    right hip/ischium, 3000 cGy 10 sessions  . Esophageal stricture   . Hiatal hernia   . Diverticulosis   . Moderate protein-calorie malnutrition 12/30/2013  . Failure to thrive 12/30/2013  . Pleural effusion, left 12/30/2013  . Severe aortic stenosis   . Atrial fibrillation   . Chronic diastolic congestive heart failure   . Acute on chronic diastolic heart failure   . S/P TAVR (transcatheter aortic valve replacement) 01/14/2014    26 mm Edwards Sapien XT transcatheter heart valve placed via direct aortic approach using right anterior mini-thoracotomy     Family History  family history includes Cancer in his father; Cancer - Colon in his mother; Cancer - Lung in his cousin; Heart attack in his father; Heart disease in his father; Hypertension in his maternal grandfather and paternal grandfather; Stroke in his maternal grandfather and paternal grandfather; Thyroid disease in his mother.  Prior Rehab/Hospitalizations:  Came to CIR 4/14 after CVA.  Went to [facility] after valve replacement for 4 weeks 02/15.  Was receiving outpatient therapy 3X a week most recently.   Current Medications  Current facility-administered medications:0.9 %  sodium chloride infusion, , Intravenous,  Continuous, Theressa Millard, MD;  acetaminophen (TYLENOL) suppository 650 mg, 650 mg, Rectal, Q6H PRN, Theressa Millard, MD;  acetaminophen (TYLENOL) tablet 650 mg, 650 mg, Oral, Q6H PRN, Theressa Millard, MD, 650 mg at 03/23/14 8527 alum & mag hydroxide-simeth (MAALOX/MYLANTA) 200-200-20 MG/5ML suspension 30 mL, 30 mL, Oral, Q6H PRN, Theressa Millard, MD;  aspirin chewable tablet 81 mg, 81 mg, Oral, Daily, Theressa Millard, MD, 81 mg at 03/27/14 0949;  docusate sodium (COLACE) capsule 100 mg, 100 mg, Oral, BID, Kelvin Cellar, MD, 100 mg at 03/27/14 7824 geriatric multivitamins-minerals (ELDERTONIC/GEVRABON) elixir ELIX 15 mL, 15 mL, Oral, Daily, Theressa Millard, MD, 15 mL at 03/27/14 0948;  HYDROmorphone (DILAUDID) injection 2 mg, 2 mg, Intravenous, Q3H PRN, Robbie Lis, MD;  lactulose (CHRONULAC) 10 GM/15ML solution 20 g, 20 g, Oral, BID PRN, Kelvin Cellar, MD;  metoCLOPramide (REGLAN) injection 5 mg, 5 mg, Intravenous, Q8H PRN, Kelvin Cellar, MD, 5 mg at 03/27/14 0651 metoprolol succinate (TOPROL-XL) 24 hr tablet 50 mg, 50 mg, Oral, Q breakfast, Theressa Millard, MD, 50 mg at 03/27/14 2353;  multivitamin with minerals tablet 1 tablet, 1 tablet, Oral, Daily, Theressa Millard, MD, 1 tablet at 03/27/14 0948;  ondansetron (ZOFRAN) injection 4 mg, 4 mg, Intravenous, Q6H PRN, Theressa Millard, MD, 4 mg at 03/23/14 0019 ondansetron (ZOFRAN) tablet 4 mg, 4 mg, Oral, Q6H PRN, Theressa Millard, MD, 4 mg at 03/24/14 1819;  oxyCODONE (Oxy IR/ROXICODONE) immediate release tablet 10 mg, 10 mg, Oral, Q4H PRN, Kelvin Cellar, MD, 10 mg at 03/27/14 1329;  pantoprazole (PROTONIX) EC tablet 40 mg, 40 mg, Oral, Daily, Theressa Millard, MD, 40 mg at 03/27/14 0954 polyethylene glycol (MIRALAX / GLYCOLAX) packet 17 g, 17 g, Oral, BID, Robbie Lis, MD, 17 g at 03/27/14 0948;  simvastatin (ZOCOR) tablet 20 mg, 20 mg, Oral, Daily, Theressa Millard, MD, 20 mg at 03/27/14 0948;  warfarin (COUMADIN)  tablet 6 mg, 6 mg, Oral, q1800, Theressa Millard, MD, 6 mg at 03/26/14 1833;  Warfarin - Pharmacist Dosing Inpatient, , Does not apply, q1800, Kelvin Cellar, MD  Patients Current Diet: General  Precautions / Restrictions Precautions Precautions: Fall Restrictions Weight Bearing Restrictions: No (Activity OOB as tolerated per evaluating  PT)   Prior Activity Level Community (5-7x/wk): Goes out 3-4 X a week.  Home Assistive Devices / Equipment Home Assistive Devices/Equipment: Gilford Rile (specify type) Home Equipment: Walker - 2 wheels;Cane - single point;Shower seat  Prior Functional Level Prior Function Level of Independence: Independent with assistive device(s) Comments: using cane and RW for ambulation  Current Functional Level Cognition  Overall Cognitive Status: Within Functional Limits for tasks assessed    Extremity Assessment (includes Sensation/Coordination)          ADLs  Anticipate ADL needs.  Evaluation pending.    Mobility  Overal bed mobility: Needs Assistance Bed Mobility: Supine to Sit Supine to sit: Min assist;HOB elevated General bed mobility comments: verbal cues for safety and sequence; used therapist hand to bring trunk upright, repositioned hips with bed pad; required significanlty increased time to complete and rest breaks between small bouts of movement due to pain    Transfers  Overall transfer level: Needs assistance Equipment used: Rolling walker (2 wheeled) Transfers: Sit to/from Stand Sit to Stand: Min assist Stand pivot transfers: Min guard General transfer comment: verbal cues for safety and sequence; cues for weight shifting to allow easier standing and reduced weight bearing to R LE according to pain tolerance; poor eccentric control upon descent    Ambulation / Gait / Stairs / Wheelchair Mobility  Ambulation/Gait Ambulation/Gait assistance: Min guard Ambulation Distance (Feet): 2 Feet (pivot steps bed to chair) Assistive device:  Rolling walker (2 wheeled) Gait Pattern/deviations: Decreased stride length;Shuffle;Antalgic General Gait Details: attempted a few shuffled steps (step length <6 in) with walker to get to recliner; pt declined to ambulate even with chair available behind him for rest breaks; reported pain was too intense (5/10)    Posture / Balance Dynamic Sitting Balance Sitting balance - Comments: pt had difficulty positioning self for balance; assisted reposition hips and pt was able to maintain balance with B UE support    Special needs/care consideration BiPAP/CPAP No CPM No Continuous Drip IV No Dialysis No         Life Vest No Oxygen No Special Bed No Trach Size No Wound Vac (area) No       Skin Reddened blister area right hip                             Bowel mgmt: Last BM 03/24/14 Bladder mgmt: Uses urinal, incontinence at times.  Has been wearing depends since CVA. Diabetic mgmt No    Previous Home Environment Living Arrangements: Spouse/significant other Available Help at Discharge: Family (wife works M-F 8 hour days) Type of Home: House Home Layout: Multi-level;Able to live on main level with bedroom/bathroom Home Access: Stairs to enter Entrance Stairs-Number of Steps: 1 [place]: No  Discharge Living Setting Plans for Discharge Living Setting: Patient's home;House;Lives with (comment) (Lives with wife, a dog and a cat.) Type of Home at Discharge: House Discharge Home Layout: Multi-level;Able to live on main level with bedroom/bathroom Alternate Level Stairs-Number of Steps: Flight Discharge Home Access: Stairs to enter Entrance Stairs-Number of Steps: 1 step in from garage to the kitchen Does the patient have any problems obtaining your medications?: No  Social/Family/Support Systems Patient Roles: Spouse;Parent (Has a wife and a son.) Contact Information: Tali Coster - wife Anticipated Caregiver: Rosemarie Ax and self Anticipated Caregiver's Contact Information:  Rosemarie Ax - wife (h) [phone] (c) [phone] Ability/Limitations of Caregiver: Wife works FT for the [employer], but they are flexible with  her schedule. Caregiver Availability: Intermittent Discharge Plan Discussed with Primary Caregiver: Yes Is Caregiver In Agreement with Plan?: Yes Does Caregiver/Family have Issues with Lodging/Transportation while Pt is in Rehab?: No  Goals/Additional Needs Patient/Family Goal for Rehab: PT/OT mod I and S goals, no ST needs Expected length of stay: 7 days Cultural Considerations: [place] Dietary Needs: Regular, thin liquids Equipment Needs: TBD Pt/Family Agrees to Admission and willing to participate: Yes Program Orientation Provided & Reviewed with Pt/Caregiver Including Roles  & Responsibilities: Yes  Decrease burden of Care through IP rehab admission:  N/A  Possible need for SNF placement upon discharge: Not anticipated  Patient Condition: This patient's medical and functional status has changed since the consult dated: 03/25/14 in which the Rehabilitation Physician determined and documented that the patient's condition is appropriate for intensive rehabilitative care in an inpatient rehabilitation facility. See "History of Present Illness" (above) for medical update. Functional changes are: Transfers min to min guard assist. Patient's medical and functional status update has been discussed with the Rehabilitation physician and patient remains appropriate for inpatient rehabilitation. Will admit to inpatient rehab today.  Preadmission Screen Completed By:  Retta Diones, 03/27/2014 2:19 PM ______________________________________________________________________   Discussed status with Dr. Letta Pate on 03/27/14 at 1447 and received telephone approval for admission today.  Admission Coordinator:  Retta Diones, time1447/Date04/16/15

## 2014-03-27 NOTE — Progress Notes (Signed)
ANTICOAGULATION CONSULT NOTE- Follow up  Pharmacy Consult for warfarin Indication: atrial fibrillation  Allergies  Allergen Reactions  . Other     Beer and Wine- swelling/headache/nausea    Patient Measurements: Height: 6' 2.02" (188 cm) Weight: 192 lb 14.4 oz (87.5 kg) IBW/kg (Calculated) : 82.24  Assessment: 16 yoM with metastatic prostate cancer admitted 4/11 with right hip pain. Patient on warfarin PTA for AFib. Patient transferred from [facility] to [place] today following XRT treatments. Pharmacy has been re-consulted to continue warfarin while inpatient  Home warfarin dose 6 mg PO daily   INR today remains in therapeutic range at 2.27  Receiving home dose of 6mg  daily   No bleeding problems noted.   Hgb and platelets were stable on 4/13  Goal of Therapy:  INR 2-3 Monitor platelets by anticoagulation protocol: Yes   Plan:  1. Continue home dose of warfarin 6mg  po daily 2. Daily INR 3. CBC in AM  Thank you for the consult.  03/27/2014    LOS 0 days  7:44 PM ,  Zahi Plaskett, Craig Guess,  Pharm.D.

## 2014-03-27 NOTE — Progress Notes (Signed)
Special treatment procedure note: The patient underwent CT simulation on 03/24/2014 for treatment to his right pelvis. We overlap of his previous lower pelvic/proximal femur field and did a dose summary plan. I and the patient accept  the increased risk for damage to the right femoral neck (fracture) in order to palliate his discomfort which I feel  is at the very least secondary to his disease along the right hip/pubic rami.

## 2014-03-27 NOTE — Progress Notes (Signed)
Patient discharged to [facility] for rehab with [facility], discharge packet sent with patient.

## 2014-03-27 NOTE — Discharge Instructions (Signed)
Hip Pain  The hips join the upper legs to the lower pelvis. The bones, cartilage, tendons, and muscles of the hip joint perform a lot of work each day holding your body weight and allowing you to move around.  Hip pain is a common symptom. It can range from a minor ache to severe pain on 1 or both hips. Pain may be felt on the inside of the hip joint near the groin, or the outside near the buttocks and upper thigh. There may be swelling or stiffness as well. It occurs more often when a person walks or performs activity. There are many reasons hip pain can develop.  CAUSES   It is important to work with your caregiver to identify the cause since many conditions can impact the bones, cartilage, muscles, and tendons of the hips. Causes for hip pain include:   Broken (fractured) bones.   Separation of the thighbone from the hip socket (dislocation).   Torn cartilage of the hip joint.   Swelling (inflammation) of a tendon (tendonitis), the sac within the hip joint (bursitis), or a joint.   A weakening in the abdominal wall (hernia), affecting the nerves to the hip.   Arthritis in the hip joint or lining of the hip joint.   Pinched nerves in the back, hip, or upper thigh.   A bulging disc in the spine (herniated disc).   Rarely, bone infection or cancer.  DIAGNOSIS   The location of your hip pain will help your caregiver understand what may be causing the pain. A diagnosis is based on your medical history, your symptoms, results from your physical exam, and results from diagnostic tests. Diagnostic tests may include X-ray exams, a computerized magnetic scan (magnetic resonance imaging, MRI), or bone scan.  TREATMENT   Treatment will depend on the cause of your hip pain. Treatment may include:   Limiting activities and resting until symptoms improve.   Crutches or other walking supports (a cane or brace).   Ice, elevation, and compression.   Physical therapy or home exercises.   Shoe inserts or special  shoes.   Losing weight.   Medications to reduce pain.   Undergoing surgery.  HOME CARE INSTRUCTIONS    Only take over-the-counter or prescription medicines for pain, discomfort, or fever as directed by your caregiver.   Put ice on the injured area:   Put ice in a plastic bag.   Place a towel between your skin and the bag.   Leave the ice on for 15-20 minutes at a time, 03-04 times a day.   Keep your leg raised (elevated) when possible to lessen swelling.   Avoid activities that cause pain.   Follow specific exercises as directed by your caregiver.   Sleep with a pillow between your legs on your most comfortable side.   Record how often you have hip pain, the location of the pain, and what it feels like. This information may be helpful to you and your caregiver.   Ask your caregiver about returning to work or sports and whether you should drive.   Follow up with your caregiver for further exams, therapy, or testing as directed.  SEEK MEDICAL CARE IF:    Your pain or swelling continues or worsens after 1 week.   You are feeling unwell or have chills.   You have increasing difficulty with walking.   You have a loss of sensation or other new symptoms.   You have questions or concerns.  SEEK   IMMEDIATE MEDICAL CARE IF:    You cannot put weight on the affected hip.   You have fallen.   You have a sudden increase in pain and swelling in your hip.   You have a fever.  MAKE SURE YOU:    Understand these instructions.   Will watch your condition.   Will get help right away if you are not doing well or get worse.  Document Released: 05/18/2010 Document Revised: 02/20/2012 Document Reviewed: 05/18/2010  [employer] Patient Information 2014 [employer].

## 2014-03-27 NOTE — Discharge Summary (Signed)
Physician Discharge Summary  Lucas Green EXH:[id number] DOB: November 24, 1932 DOA: 03/21/2014  PCP: Teressa Lower, MD  Admit date: 03/21/2014 Discharge date: 03/27/2014  Recommendations for Outpatient Follow-up:  1. Transfer to inpatient rehab 2. Continue pain regimen with dilaudid 2 mg IV every 2-3 hours as needed for pain control  Discharge Diagnoses:  Principal Problem:   Intractable pain Active Problems:   Hypertension   Renal insufficiency   Prostate cancer with bone mets   Dyslipidemia   PAF (paroxysmal atrial fibrillation)   Chronic diastolic congestive heart failure   Pathologic pelvic fracture    Discharge Condition: stable  Diet recommendation: as tolerated  History of present illness:  80 year old gentleman with a past medical history of metastatic prostate cancer diagnosed in 2010, currently on hormonal therapy, undergoing palliative radiation therapy at the cancer center (under the care of Dr. Valere Dross of radiation oncology). Patient completed treatment on 10/11/2013 with improvement in right hip pain. Patient was admitted to the medicine service on 03/22/2014 with intractable pain in right hip progressively worsening for past 3 weeks. Initial hip x-ray revealed the presence of multifocal bone metastasis involving the pelvis as well as progression of metastasis involving right anterior iliac spine. There were pathologic fractures involving the right superior and inferior pubic rami. He was started on as needed narcotic analgesics for pain management. Patient was seen and evaluated by both Dr. Alen Blew of medical oncology and Dr. Lisbeth Renshaw of radiation oncology. Dr. Lisbeth Renshaw recommended transferring the patient to [facility] hospital on 03/24/2014 to proceed with radiation therapy which was completed 03/27/2014.  Assessment/Plan:   Principal Problem:  Right hip pain / pathologic fractures involving right superior and inferior pubic rami / metastatic prostate cancer.  - RT started on  03/26/2014 and has been completed 03/27/2014.  - Current pain regimen includes dilaudid 2 mg IV every 3 hours IV PRN and oxycodone 10 mg PO every 4 hours PRN.  Active Problems: History of CVA.  - aspirin for secondary stroke prevention  Status post transcatheter aortic valve replacement. Paroxysmal atrial fibrillation.  - Performed on 01/14/2014.  - continue coumadin per pharmacy dosing  - rate controlled with metoprolol  Constipation  -Continue Colace and PRN lactulose; added miralax BID  Dyslipidemia - continue statin therapy Hypertension - continue metoprolol 50 mg daily  Code Status: full code  Family Communication: family at the bedside    Consultants:  Radiation oncology  Inpatient rehab Procedures:  RT Antibiotics:  None    Signed:  Robbie Lis, MD  [facility] 03/27/2014, 4:23 PM  Pager #: [phone]   Discharge Exam: Filed Vitals:   03/27/14 1400  BP: 119/66  Pulse: 85  Temp: 97.6 F (36.4 C)  Resp: 18   Filed Vitals:   03/27/14 0215 03/27/14 0522 03/27/14 1036 03/27/14 1400  BP: 109/60 119/55 107/60 119/66  Pulse: 90 84 89 85  Temp: 98.7 F (37.1 C) 99.2 F (37.3 C) 97.4 F (36.3 C) 97.6 F (36.4 C)  TempSrc: Oral Oral Oral Oral  Resp: 20 18 18 18   Height:      Weight:      SpO2: 97% 97% 94% 98%    General: Pt is alert, follows commands appropriately, not in acute distress Cardiovascular: Regular rate and rhythm, S1/S2 appreciated Respiratory: Clear to auscultation bilaterally, no wheezing, no crackles, no rhonchi Abdominal: Soft, non tender, non distended, bowel sounds +, no guarding Extremities: no edema, no cyanosis, pulses palpable bilaterally DP and PT Neuro: Grossly nonfocal  Discharge Instructions  Discharge Orders   Future Appointments Provider Department Dept Phone   03/28/2014 8:30 AM [place] A [phone]   03/28/2014 10:00 AM Salome Spotted, [place] [facility] [phone]   03/28/2014 2:30 PM [place] [facility] [phone]   03/28/2014 3:15 PM [place] [facility] [phone]   04/22/2014 3:40 PM Rexene Edison, MD [facility] [phone]   05/02/2014 11:15 AM Lelon Perla, MD [facility] [phone]   Future Orders Complete By Expires   Call MD for:  difficulty breathing, headache or visual disturbances  As directed    Call MD for:  persistant dizziness or light-headedness  As directed    Call MD for:  persistant nausea and vomiting  As directed    Call MD for:  severe uncontrolled pain  As directed    Diet - low sodium heart healthy  As directed    Increase activity slowly  As directed        Medication List         acetaminophen 325 MG tablet  Commonly known as:  TYLENOL  Take 2 tablets (650 mg total) by mouth every 6 (six) hours as needed for mild pain (or Fever >/= 101).     alum & mag hydroxide-simeth 200-200-20 MG/5ML suspension  Commonly known as:  MAALOX/MYLANTA  Take 30 mLs by mouth every 6 (six) hours as needed for indigestion or heartburn (dyspepsia).     aspirin 81 MG chewable tablet  Chew 1 tablet (81 mg total) by mouth daily.     DSS 100 MG Caps  Take 100 mg by mouth 2 (two) times daily.     geriatric multivitamins-minerals Elix  Take 15 mLs by mouth daily.     Geritol Liqd  Take 1 Container by mouth daily.     HYDROmorphone 2 MG/ML Soln injection  Commonly known as:  DILAUDID  Inject 1 mL (2 mg total) into the vein every 3 (three) hours as needed for severe pain.     ibuprofen 800 MG tablet  Commonly known as:  ADVIL,MOTRIN  Take 800 mg by mouth every 8 (eight) hours as needed for pain.     lactulose 10 GM/15ML solution  Commonly known as:  CHRONULAC  Take 30 mLs (20 g total) by mouth 2 (two) times daily as needed for  moderate constipation.     metoprolol succinate 50 MG 24 hr tablet  Commonly known as:  TOPROL-XL  Take 50 mg by mouth daily. Take with or immediately following a meal.     multivitamin with minerals Tabs tablet  Take 1 tablet by mouth daily.     ondansetron 4 MG tablet  Commonly known as:  ZOFRAN  Take 1 tablet (4 mg total) by mouth every 6 (six) hours as needed for nausea.     pantoprazole 40 MG tablet  Commonly known as:  PROTONIX  Take 40 mg by mouth daily.     polyethylene glycol packet  Commonly known as:  MIRALAX / GLYCOLAX  Take 17 g by mouth 2 (two) times daily.     simvastatin 20 MG tablet  Commonly known as:  ZOCOR  Take 20 mg by mouth daily.     warfarin 6 MG tablet  Commonly known as:  COUMADIN  Take 6 mg  by mouth daily.           Follow-up Information   Follow up with [name],[name], MD. Schedule an appointment as soon as possible for a visit in 1 week.   Specialty:  Family Medicine   Contact information:   [address] [place] [address] [phone]        The results of significant diagnostics from this hospitalization (including imaging, microbiology, ancillary and laboratory) are listed below for reference.    Significant Diagnostic Studies: Dg Hip Complete Right  03/22/2014   CLINICAL DATA:  Worsening right hip pain  EXAM: RIGHT HIP - COMPLETE 2+ VIEW  COMPARISON:  01/06/2014  FINDINGS: Multi focal bone metastasis are noted involving the bony pelvis bilaterally. Lesions are mixed lytic and sclerotic. Since the previous exam there has been progression of destructive bone changes involving the right anterior iliac spine. There are also pathologic fractures involving the right superior and inferior pubic rami. The right hip remains located. There are advanced changes of osteoarthritis.  IMPRESSION: Multi focal bone metastasis involving the pelvis.  Progression of metastasis involving the right anterior iliac spine  Pathologic fractures involving the  right superior and inferior pubic rami.   Electronically Signed   By: Kerby Moors M.D.   On: 03/22/2014 01:17    Microbiology: No results found for this or any previous visit (from the past 240 hour(s)).   Labs: Basic Metabolic Panel:  Recent Labs Lab 03/22/14 0144 03/24/14 0431  NA 135* 131*  K 4.2 4.6  CL 99 93*  CO2 21 22  GLUCOSE 129* 105*  BUN 29* 25*  CREATININE 0.91 0.95  CALCIUM 9.3 8.9   Liver Function Tests: No results found for this basename: AST, ALT, ALKPHOS, BILITOT, PROT, ALBUMIN,  in the last 168 hours No results found for this basename: LIPASE, AMYLASE,  in the last 168 hours No results found for this basename: AMMONIA,  in the last 168 hours CBC:  Recent Labs Lab 03/22/14 0144 03/24/14 0431  WBC 9.7 10.4  HGB 10.4* 10.0*  HCT 30.4* 29.5*  MCV 91.6 91.9  PLT 202 244   Cardiac Enzymes: No results found for this basename: CKTOTAL, CKMB, CKMBINDEX, TROPONINI,  in the last 168 hours BNP: BNP (last 3 results)  Recent Labs  12/30/13 2210 01/01/14 0449 01/19/14 0400  PROBNP 15574.0* 13190.0* 2105.0*   CBG: No results found for this basename: GLUCAP,  in the last 168 hours  Time coordinating discharge: Over 30 minutes

## 2014-03-27 NOTE — Progress Notes (Signed)
Pt was lying in bed, watching tv when I arrived. His wife was bedside. Pt introduced himself as Dr. Rolanda Jay. 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  Pt was appreciative of his wife who takes care of him. Pt described why he was in the hospital but said he is doing ok and has a lot of family support. Pt and wife were very friendly and appreciative for visit. Ernest Haber Chaplain

## 2014-03-28 ENCOUNTER — Inpatient Hospital Stay (HOSPITAL_COMMUNITY): Payer: Medicare Other

## 2014-03-28 ENCOUNTER — Inpatient Hospital Stay (HOSPITAL_COMMUNITY): Payer: Medicare Other | Admitting: Physical Therapy

## 2014-03-28 ENCOUNTER — Inpatient Hospital Stay (HOSPITAL_COMMUNITY): Payer: Medicare Other | Admitting: *Deleted

## 2014-03-28 DIAGNOSIS — C7951 Secondary malignant neoplasm of bone: Secondary | ICD-10-CM

## 2014-03-28 DIAGNOSIS — C61 Malignant neoplasm of prostate: Secondary | ICD-10-CM

## 2014-03-28 DIAGNOSIS — C7952 Secondary malignant neoplasm of bone marrow: Secondary | ICD-10-CM

## 2014-03-28 DIAGNOSIS — N289 Disorder of kidney and ureter, unspecified: Secondary | ICD-10-CM

## 2014-03-28 DIAGNOSIS — J189 Pneumonia, unspecified organism: Secondary | ICD-10-CM

## 2014-03-28 LAB — CBC WITH DIFFERENTIAL/PLATELET
BASOS ABS: 0.1 10*3/uL (ref 0.0–0.1)
Basophils Relative: 1 % (ref 0–1)
EOS PCT: 2 % (ref 0–5)
Eosinophils Absolute: 0.2 10*3/uL (ref 0.0–0.7)
HCT: 29.4 % — ABNORMAL LOW (ref 39.0–52.0)
Hemoglobin: 10.1 g/dL — ABNORMAL LOW (ref 13.0–17.0)
LYMPHS PCT: 11 % — AB (ref 12–46)
Lymphs Abs: 1 10*3/uL (ref 0.7–4.0)
MCH: 31.3 pg (ref 26.0–34.0)
MCHC: 34.4 g/dL (ref 30.0–36.0)
MCV: 91 fL (ref 78.0–100.0)
Monocytes Absolute: 1.5 10*3/uL — ABNORMAL HIGH (ref 0.1–1.0)
Monocytes Relative: 17 % — ABNORMAL HIGH (ref 3–12)
NEUTROS ABS: 6.3 10*3/uL (ref 1.7–7.7)
NEUTROS PCT: 69 % (ref 43–77)
PLATELETS: 326 10*3/uL (ref 150–400)
RBC: 3.23 MIL/uL — ABNORMAL LOW (ref 4.22–5.81)
RDW: 13.3 % (ref 11.5–15.5)
WBC: 9 10*3/uL (ref 4.0–10.5)

## 2014-03-28 LAB — COMPREHENSIVE METABOLIC PANEL
ALT: 18 U/L (ref 0–53)
AST: 38 U/L — ABNORMAL HIGH (ref 0–37)
Albumin: 2.3 g/dL — ABNORMAL LOW (ref 3.5–5.2)
Alkaline Phosphatase: 321 U/L — ABNORMAL HIGH (ref 39–117)
BUN: 22 mg/dL (ref 6–23)
CALCIUM: 9.1 mg/dL (ref 8.4–10.5)
CHLORIDE: 92 meq/L — AB (ref 96–112)
CO2: 21 mEq/L (ref 19–32)
Creatinine, Ser: 0.88 mg/dL (ref 0.50–1.35)
GFR calc Af Amer: >90 mL/min (ref >90)
GFR calc non Af Amer: 80 mL/min — ABNORMAL LOW (ref >90)
Glucose, Bld: 111 mg/dL — ABNORMAL HIGH (ref 70–99)
POTASSIUM: 4.4 meq/L (ref 3.7–5.3)
Sodium: 128 mEq/L — ABNORMAL LOW (ref 137–147)
Total Bilirubin: 0.3 mg/dL (ref 0.3–1.2)
Total Protein: 6.1 g/dL (ref 6.0–8.3)

## 2014-03-28 LAB — PROTIME-INR
INR: 3.03 — ABNORMAL HIGH (ref 0.00–1.49)
Prothrombin Time: 30.3 seconds — ABNORMAL HIGH (ref 11.6–15.2)

## 2014-03-28 MED ORDER — LIDOCAINE 5 % EX PTCH
2.0000 | MEDICATED_PATCH | CUTANEOUS | Status: DC
  Administered 2014-03-29: 2 via TRANSDERMAL
  Filled 2014-03-28 (×2): qty 2

## 2014-03-28 MED ORDER — WARFARIN SODIUM 3 MG PO TABS
3.0000 mg | ORAL_TABLET | Freq: Once | ORAL | Status: AC
  Administered 2014-03-28: 3 mg via ORAL
  Filled 2014-03-28: qty 1

## 2014-03-28 MED ORDER — MORPHINE SULFATE ER 15 MG PO TBCR
15.0000 mg | EXTENDED_RELEASE_TABLET | Freq: Two times a day (BID) | ORAL | Status: DC
  Administered 2014-03-28 – 2014-03-30 (×4): 15 mg via ORAL
  Filled 2014-03-28 (×6): qty 1

## 2014-03-28 NOTE — Progress Notes (Signed)
Anticoag:  - 14 yoM with metastatic prostate cancer admitted 4/11 with right hip pain. Patient on warfarin PTA for AFib. Patient transferred to [facility] on 4/14 for XRT treatments. Pharmacy has been consulted to continue warfarin while inpatient - Home warfarin dose 6 mg PO daily  - INR today is supratherapeutic at 3.03; was receiving home dose of coumadin - Hgb 10.1 and Plt 326 K (stable)  GOAL: INR 2-3  PLAN:  1. D/c standing coumadin order. Coumadin 3mg  po x1 2. Daily INR's, CBC. Monitor for bleeding complications.

## 2014-03-28 NOTE — H&P (Signed)
Physical Medicine and Rehabilitation Admission H&P      Chief Complaint   Patient presents with   .  Prostate Cancer with pathologic of right superior and inferior pubic rami and well as multiple bony pelvic mets bilaterally.         HPI: Lucas Green is a 80 y.o. male with a history of Metastatic Prostate Cancer, recent TVAR due to sever AVS, PAF- coumadin Rx, Chronic mixed CHF, prior CVA who presents to the ED on 03/21/14 with reports of 2-3 week history of progressive right hip pain with inability to stand on RLE as well as intractable Right hip pain X 24 hours. X-rays of right Hip revealed progressive destructive bone changes of right anterior iliac spine with pathologic fractures involving the right superior and inferior pubic rami. He was started on narcotics for pain management and Radiation Onc consulted for input. He was evaluated by Dr. Lisbeth Renshaw and XRT recommended for pain management--last treatment today. He continues to have poor activity tolerance due to severe pain. CIR recommended by rehab team and patient admitted today.    Review of Systems  Constitutional: Positive for malaise/fatigue.        Lack of appetite X 1 year  HENT: Negative for hearing loss.   Eyes: Negative for blurred vision and double vision.  Respiratory: Negative for cough and shortness of breath.   Cardiovascular: Negative for chest pain and palpitations.  Gastrointestinal: Positive for nausea. Negative for heartburn.  Genitourinary: Positive for urgency.        Nocturia X 3-4  Musculoskeletal: Positive for back pain, joint pain and myalgias.  Neurological: Positive for focal weakness (Mild LLE since CVA) and weakness. Negative for dizziness, tingling and headaches.  Psychiatric/Behavioral: The patient is nervous/anxious and has insomnia.      Past Medical History   Diagnosis  Date   .  Hypertension     .  GERD (gastroesophageal reflux disease)     .  Dyslipidemia     .  Aortic stenosis      .  Prostate cancer  09/2009   .  Metastasis from malignant tumor of prostate         right iliac   .  Hearing loss     .  Incontinence of urine     .  Male impotence     .  Degenerative arthritis         osteoarthritis   .  Stroke  03/29/13   .  Hx of radiation therapy  09/30/13- 10/11/13       right hip/ischium, 3000 cGy 10 sessions   .  Esophageal stricture     .  Hiatal hernia     .  Diverticulosis     .  Moderate protein-calorie malnutrition  12/30/2013   .  Failure to thrive  12/30/2013   .  Pleural effusion, left  12/30/2013   .  Severe aortic stenosis     .  Atrial fibrillation     .  Chronic diastolic congestive heart failure     .  Acute on chronic diastolic heart failure     .  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  Tonsillectomy  37   .  Cryosurgery prostate    09/2009       prostate cancer, Gleason 7   .  Intraoperative transesophageal echocardiogram  N/A  01/14/2014       Procedure: INTRAOPERATIVE TRANSESOPHAGEAL ECHOCARDIOGRAM;  Surgeon: Rexene Alberts, MD;  Location: [facility];  Service: Open Heart Surgery;  Laterality: N/A;   .  Transcatheter aortic valve replacement, transaortic  N/A  01/14/2014       Procedure: TRANSCATHETER AORTIC VALVE REPLACEMENT, TRANSAORTIC;  Surgeon: Rexene Alberts, MD;  Location: [facility];  Service: Open Heart Surgery;  Laterality: N/A;   .  Chest tube insertion  Left  01/14/2014       Procedure: CHEST TUBE INSERTION;  Surgeon: Rexene Alberts, MD;  Location: [facility];  Service: Open Heart Surgery;  Laterality: Left;       Family History   Problem  Relation  Age of Onset   .  Heart disease  Father     .  Cancer  Father         prostate   .  Heart attack  Father     .  Cancer - Colon  Mother     .  Thyroid disease  Mother     .   Stroke  Maternal Grandfather     .  Hypertension  Maternal Grandfather     .  Stroke  Paternal Grandfather     .  Hypertension  Paternal Grandfather     .  Cancer - Lung  Cousin        Social History: Married. Still deconditioned since AVR/ was independent with walker AD. Wife works days. Per reports he has never smoked. He has never used smokeless tobacco. Per reports that he drinks alcohol. He reports that he does not use illicit drugs.     Allergies   Allergen  Reactions   .  Other         Beer and Wine- swelling/headache/nausea    Medications Prior to Admission   Medication  Sig  Dispense  Refill   .  aspirin 81 MG chewable tablet  Chew 1 tablet (81 mg total) by mouth daily.         Marland Kitchen  ibuprofen (ADVIL,MOTRIN) 800 MG tablet  Take 800 mg by mouth every 8 (eight) hours as needed for pain.         .  Iron-Vitamins (GERITOL) LIQD  Take 1 Container by mouth daily.         .  metoprolol succinate (TOPROL-XL) 50 MG 24 hr tablet  Take 50 mg by mouth daily. Take with or immediately following a meal.         .  Multiple Vitamin (MULTIVITAMIN WITH MINERALS) TABS  Take 1 tablet by mouth daily.         .  pantoprazole (PROTONIX) 40 MG tablet  Take 40 mg by mouth daily.         .  simvastatin (ZOCOR) 20 MG tablet  Take 20 mg by mouth daily.         Marland Kitchen  warfarin (COUMADIN) 6 MG tablet  Take 6 mg by mouth daily.            Home: Home Living Family/patient expects to be discharged to:: Private residence Living Arrangements: Spouse/significant other Available Help at Discharge: Family (wife works M-F 8 hour days) Type of Home: House Home Access: Stairs to enter [employer] of Steps: [place]:  Multi-level;Able to live on main level with bedroom/bathroom Home Equipment: Gilford Rile - 2 wheels;Cane - single point;Shower seat    Functional History: Prior Function Comments: using cane and RW for ambulation   Functional Status:   Mobility: Ambulation/Gait Ambulation Distance  (Feet): 2 Feet (pivot steps bed to chair) General Gait Details: attempted a few shuffled steps (step length <6 in) with walker to get to recliner; pt declined to ambulate even with chair available behind him for rest breaks; reported pain was too intense (5/10)   ADL:   Cognition: Cognition Overall Cognitive Status: Within Functional Limits for tasks assessed Cognition Arousal/Alertness: Awake/alert Behavior During Therapy: WFL for tasks assessed/performed Overall Cognitive Status: Within Functional Limits for tasks assessed   Physical Exam: Blood pressure 107/60, pulse 89, temperature 97.4 F (36.3 C), temperature source Oral, resp. rate 18, height 6\' 2"  (1.88 m), weight 87.5 kg (192 lb 14.4 oz), SpO2 94.00%. Physical Exam  Nursing note and vitals reviewed. Constitutional: He is oriented to person, place, and time. He appears well-developed and well-nourished.  HENT:   Head: Normocephalic and atraumatic.  Eyes: Conjunctivae are normal. Pupils are equal, round, and reactive to light.  Neck: Normal range of motion. Neck supple.  Cardiovascular: Normal rate and regular rhythm.  No murmurs, rubs, or gallops No murmur heard. Respiratory: Effort normal and breath sounds normal. No respiratory distress. He has no wheezes.  GI: Soft. Bowel sounds are normal.  Neurological: He is alert and oriented to person, place, and time.  Skin: Skin is warm and dry.  Psychiatric: His behavior is normal. Thought content normal. His mood appears anxious.      Results for orders placed during the hospital encounter of 03/21/14 (from the past 48 hour(s))   PROTIME-INR     Status: Abnormal     Collection Time      03/26/14  3:21 AM       Result  Value  Ref Range     Prothrombin Time  23.6 (*)  11.6 - 15.2 seconds     INR  2.18 (*)  0.00 - 1.49   PROTIME-INR     Status: Abnormal     Collection Time      03/27/14  3:35 AM       Result  Value  Ref Range     Prothrombin Time  24.3 (*)  11.6 - 15.2  seconds     INR  2.27 (*)  0.00 - 1.49    No results found.   Post Admission Physician Evaluation: Functional deficits secondary  to metastatic prostate cancer to the pelvis. Patient is admitted to receive collaborative, interdisciplinary care between the physiatrist, rehab nursing staff, and therapy team. Patient's level of medical complexity and substantial therapy needs in context of that medical necessity cannot be provided at a lesser intensity of care such as a SNF. Patient has experienced substantial functional loss from his/her baseline which was documented above under the "Functional History" and "Functional Status" headings.  Judging by the patient's diagnosis, physical exam, and functional history, the patient has potential for functional progress which will result in measurable gains while on inpatient rehab.  These gains will be of substantial and practical use upon discharge  in facilitating mobility and self-care at the household level. Physiatrist will provide 24 hour management of medical needs as well as oversight of the therapy plan/treatment and provide guidance as appropriate regarding the interaction of the two. 24 hour rehab nursing will assist with bladder management, bowel management,  safety, skin/wound care, disease management, medication administration, pain management and patient education  and help integrate therapy concepts, techniques,education, etc. PT will assess and treat for/with: Lower extremity strength, range of motion, stamina, balance, functional mobility, safety, adaptive techniques and equipment, pain mgt, pacing, appropriate goal setting, family education, ego-support.   Goals are: mod I. OT will assess and treat for/with: ADL's, functional mobility, safety, upper extremity strength, adaptive techniques and equipment, pain mgt, pacing, ego-support, family ed.   Goals are: mod I. SLP will assess and treat for/with: n/a.  Goals are: n/a. Case Management and  Social Worker will assess and treat for psychological issues and discharge planning. Team conference will be held weekly to assess progress toward goals and to determine barriers to discharge. Patient will receive at least 3 hours of therapy per day at least 5 days per week. ELOS: 7-10 days        Prognosis:  good     Medical Problem List and Plan: Metastatic prostate cancer to the pelvis   1. DVT Prophylaxis/Anticoagulation: Pharmaceutical: Coumadin 2. Right hip pain/Pain Management: continue po dilaudid  -will add ms contin as well and titrate as needed  -appropriate pacing/rest breaks etc 3. Mood: LCSW to follow for evaluation and support.   4. Neuropsych: This patient is capable of making decisions on his own behalf. 5. A Fib/TAVR 01/2014: Continue coumadin   6. H/O CVA: continue ASA for secondary stroke prevention.   7. NICM/Chronic combined systolic and diastolic CHF: OfF diuretics and No ace due to low BP. Low salt diet. Monitor daily weights.   8. Constipation:  Continue miralax bid and add Senna S additionally. Will check KUB as has some complaints of nausea 9. Chronic fatigue with anorexia: will add supplements to help with energy levels.    Meredith Staggers, MD, [facility]  4/16/201

## 2014-03-28 NOTE — Discharge Instructions (Addendum)
Inpatient Rehab Discharge Instructions  Lucas Green Discharge date and time:    Activities/Precautions/ Functional Status: Activity: activity as tolerated Diet: regular diet Wound Care: none needed  Functional status:  ___ No restrictions     ___ Walk up steps independently ___ 24/7 supervision/assistance   ___ Walk up steps with assistance ___ Intermittent supervision/assistance  ___ Bathe/dress independently ___ Walk with walker     ___ Bathe/dress with assistance ___ Walk Independently    ___ Shower independently ___ Walk with assistance    ___ Shower with assistance ___ No alcohol     ___ Return to work/school ________  Special Instructions:    My questions have been answered and I understand these instructions. I will adhere to these goals and the provided educational materials after my discharge from the hospital.  Patient/Caregiver Signature _______________________________ Date __________  Clinician Signature _______________________________________ Date __________  Please bring this form and your medication list with you to all your follow-up doctor's appointments.      Information on my medicine - Coumadin   (Warfarin)  This medication education was reviewed with me or my healthcare representative as part of my discharge preparation.  The pharmacist that spoke with me during my hospital stay was:  Steffanie Dunn, PharmD  Why was Coumadin prescribed for you? Coumadin was prescribed for you because you have a blood clot or a medical condition that can cause an increased risk of forming blood clots. Blood clots can cause serious health problems by blocking the flow of blood to the heart, lung, or brain. Coumadin can prevent harmful blood clots from forming. As a reminder your indication for Coumadin is:   Stroke Prevention Because Of Atrial Fibrillation  What test will check on my response to Coumadin? While on Coumadin (warfarin) you will need to have an INR test  regularly to ensure that your dose is keeping you in the desired range. The INR (international normalized ratio) number is calculated from the result of the laboratory test called prothrombin time (PT).  If an INR APPOINTMENT HAS NOT ALREADY BEEN MADE FOR YOU please schedule an appointment to have this lab work done by your health care provider within 7 days. Your INR goal is usually a number between:  2 to 3 or your provider may give you a more narrow range like 2-2.5.  Ask your health care provider during an office visit what your goal INR is.  What  do you need to  know  About  COUMADIN? Take Coumadin (warfarin) exactly as prescribed by your healthcare provider about the same time each day.  DO NOT stop taking without talking to the doctor who prescribed the medication.  Stopping without other blood clot prevention medication to take the place of Coumadin may increase your risk of developing a new clot or stroke.  Get refills before you run out.  What do you do if you miss a dose? If you miss a dose, take it as soon as you remember on the same day then continue your regularly scheduled regimen the next day.  Do not take two doses of Coumadin at the same time.  Important Safety Information A possible side effect of Coumadin (Warfarin) is an increased risk of bleeding. You should call your healthcare provider right away if you experience any of the following:   Bleeding from an injury or your nose that does not stop.   Unusual colored urine (red or dark brown) or unusual colored stools (red or black).  Unusual bruising for unknown reasons.   A serious fall or if you hit your head (even if there is no bleeding).  Some foods or medicines interact with Coumadin (warfarin) and might alter your response to warfarin. To help avoid this:   Eat a balanced diet, maintaining a consistent amount of Vitamin K.   Notify your provider about major diet changes you plan to make.   Avoid alcohol or limit your  intake to 1 drink for women and 2 drinks for men per day. (1 drink is 5 oz. wine, 12 oz. beer, or 1.5 oz. liquor.)  Make sure that ANY health care provider who prescribes medication for you knows that you are taking Coumadin (warfarin).  Also make sure the healthcare provider who is monitoring your Coumadin knows when you have started a new medication including herbals and non-prescription products.  Coumadin (Warfarin)  Major Drug Interactions  Increased Warfarin Effect Decreased Warfarin Effect  Alcohol (large quantities) Antibiotics (esp. Septra/Bactrim, Flagyl, Cipro) Amiodarone (Cordarone) Aspirin (ASA) Cimetidine (Tagamet) Megestrol (Megace) NSAIDs (ibuprofen, naproxen, etc.) Piroxicam (Feldene) Propafenone (Rythmol SR) Propranolol (Inderal) Isoniazid (INH) Posaconazole (Noxafil) Barbiturates (Phenobarbital) Carbamazepine (Tegretol) Chlordiazepoxide (Librium) Cholestyramine (Questran) Griseofulvin Oral Contraceptives Rifampin Sucralfate (Carafate) Vitamin K   Coumadin (Warfarin) Major Herbal Interactions  Increased Warfarin Effect Decreased Warfarin Effect  Garlic Ginseng Ginkgo biloba Coenzyme Q10 Green tea St. Johns wort    Coumadin (Warfarin) FOOD Interactions  Eat a consistent number of servings per week of foods HIGH in Vitamin K (1 serving =  cup)  Collards (cooked, or boiled & drained) Kale (cooked, or boiled & drained) Mustard greens (cooked, or boiled & drained) Parsley *serving size only =  cup Spinach (cooked, or boiled & drained) Swiss chard (cooked, or boiled & drained) Turnip greens (cooked, or boiled & drained)  Eat a consistent number of servings per week of foods MEDIUM-HIGH in Vitamin K (1 serving = 1 cup)  Asparagus (cooked, or boiled & drained) Broccoli (cooked, boiled & drained, or raw & chopped) Brussel sprouts (cooked, or boiled & drained) *serving size only =  cup Lettuce, raw (green leaf, endive, romaine) Spinach, raw Turnip  greens, raw & chopped   These websites have more information on Coumadin (warfarin):  FailFactory.se; VeganReport.com.au;

## 2014-03-28 NOTE — Evaluation (Signed)
Physical Therapy Assessment and Plan  Patient Details  Name: Lucas Green MRN: [id number] Date of Birth: 21-Jul-1932  PT Diagnosis: Abnormal posture, Cognitive deficits, Difficulty walking, Low back pain, Muscle weakness and Pain in R hip/pelvis Rehab Potential: Fair ELOS: 12-14 days   Today's Date: 03/28/2014 Time: 4098-1191 and 1430-1500 Time Calculation (min): 60 min and 30 min  Problem List:  Patient Active Problem List   Diagnosis Date Noted  . Physical deconditioning 03/27/2014  . Intractable pain 03/22/2014  . Pathologic pelvic fracture 03/22/2014  . S/P TAVR (transcatheter aortic valve replacement) 01/14/2014  . Acute on chronic systolic and diastolic heart failure, NYHA class 4 01/07/2014  . Severe aortic stenosis   . PAF (paroxysmal atrial fibrillation)   . Chronic diastolic congestive heart failure   . Acute on chronic diastolic heart failure   . Aortic valve disorders 01/01/2014  . Pleural effusion, left 12/30/2013  . Hypokalemia 12/30/2013  . CAP (community acquired pneumonia) 12/30/2013  . Nausea and vomiting 12/30/2013  . Dyslipidemia 12/30/2013  . Weakness 12/30/2013  . Failure to thrive 12/30/2013  . Moderate protein-calorie malnutrition 12/30/2013  . Bone metastases 09/24/2013  . Metastasis from malignant tumor of prostate   . Renal insufficiency 04/02/2013  . Hypertension 03/29/2013  . H/O: CVA (cerebrovascular accident) April 2014 03/29/2013  . Prostate cancer with bone mets 09/11/2009    Past Medical History:  Past Medical History  Diagnosis Date  . Hypertension   . GERD (gastroesophageal reflux disease)   . Dyslipidemia   . Aortic stenosis   . Prostate cancer 09/2009  . 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 Hearing loss   . Incontinence of urine   . 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 Degenerative arthritis     osteoarthritis  . Stroke 03/29/13  . Hx of radiation therapy 09/30/13- 10/11/13    right hip/ischium, 3000 cGy 10  sessions  . Esophageal stricture   . Hiatal hernia   . Diverticulosis   . Moderate protein-calorie malnutrition 12/30/2013  . Failure to thrive 12/30/2013  . Pleural effusion, left 12/30/2013  . Severe aortic stenosis   . Atrial fibrillation   . Chronic diastolic congestive heart failure   . Acute on chronic diastolic heart failure   . 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 Tonsillectomy  1938  . Cryosurgery prostate  09/2009    prostate cancer, Gleason 7  . Intraoperative transesophageal echocardiogram N/A 01/14/2014    Procedure: INTRAOPERATIVE TRANSESOPHAGEAL ECHOCARDIOGRAM;  Surgeon: Rexene Alberts, MD;  Location: [place];  Service: Open Heart Surgery;  Laterality: N/A;  . Transcatheter aortic valve replacement, transaortic N/A 01/14/2014    Procedure: TRANSCATHETER AORTIC VALVE REPLACEMENT, TRANSAORTIC;  Surgeon: Rexene Alberts, MD;  Location: [place];  Service: Open Heart Surgery;  Laterality: N/A;  . Chest tube insertion Left 01/14/2014    Procedure: CHEST TUBE INSERTION;  Surgeon: Rexene Alberts, MD;  Location: [place];  Service: Open Heart Surgery;  Laterality: Left;    Assessment & Plan Clinical Impression: Patient is a 80 y.o. male with a history of Metastatic Prostate Cancer, Paroxsymal Atrial fibrillation on coumadin Rx, Chronic Diastolic CHF, HTN, and prior CVA who presents to the ED with complaints of intractable Right hip pain over the  past 24 hours. He reports having increased pain for the past 2-3 weeks but the pain was unbearable for the past 24 hours. He has not been able to stand on his right leg because of the pain . He rates the pain at a 7/10 and describes the pain as dull and achy, The pain radiates down the right leg. He took 800 mg of Ibuprofen at home  without relief. He denies any history of trauma or fall. He was evaluated in the ED, X-rays of right Hip revealed progressive destructive bone changes of right anterior iliac spine with pathologic fractures involving the right superior and inferior pubic rami. He was started on narcotics for pain management and Radiation Onc consulted for input. He was evaluated by Dr. Lisbeth Renshaw and XRT recommended for pain management--last treatment today. He continues to have poor activity tolerance due to severe pain.  Patient transferred to CIR on 03/27/2014 .   Patient currently requires max with mobility secondary to pain, muscle weakness, decreased cardiorespiratoy endurance, decreased attention and decreased sitting balance, decreased standing balance, decreased postural control and decreased balance strategies.  Prior to hospitalization, patient was modified independent  with mobility with RW and lived with Spouse in a House home.  Home access is 1Stairs to enter.  Patient will benefit from skilled PT intervention to maximize safe functional mobility, minimize fall risk and decrease caregiver burden for planned discharge home with 24 hour supervision.  Anticipate patient will benefit from follow up [place] at discharge.  PT - End of Session Activity Tolerance: Decreased this session Endurance Deficit: Yes Endurance Deficit Description: recent radiation, metastatic CA, recent cardiac surgery--was in cardiac rehab, deconditioned, pain PT Assessment Rehab Potential: Fair Barriers to Discharge: Decreased caregiver support Barriers to Discharge Comments: Wife works and will be unable to stay with pt during the day; recommending supervision secondary to falls risk PT Patient demonstrates impairments in the following area(s): Balance;Endurance;Motor;Pain PT Transfers Functional Problem(s): Bed Mobility;Bed to Chair;Car PT Locomotion Functional Problem(s): Ambulation;Wheelchair Mobility;Stairs PT Plan PT Intensity: Minimum  of 1-2 x/day ,45 to 90 minutes PT Frequency: 5 out of 7 days PT Duration Estimated Length of Stay: 12-14 days PT Treatment/Interventions: Ambulation/gait training;Balance/vestibular training;Discharge planning;Disease management/prevention;DME/adaptive equipment instruction;Functional mobility training;Neuromuscular re-education;Pain management;Patient/family education;Psychosocial support;Stair training;Therapeutic Activities;Therapeutic Exercise;UE/LE Strength taining/ROM;Wheelchair propulsion/positioning;Cognitive remediation/compensation PT Transfers Anticipated Outcome(s): Supervision PT Locomotion Anticipated Outcome(s): Supervision short distances with RW PT Recommendation Recommendations for Other Services: Neuropsych consult Follow Up Recommendations: Home health PT;Skilled nursing facility (HHPT vs. SNF if 24/7 not available) Patient destination: Home Equipment Recommended: Wheelchair cushion (measurements);Wheelchair (measurements)  Skilled Therapeutic Intervention AM session: Following PT evaluation pt returned to room.  Pt requesting to lie down in bed for 30 minutes until OT B&D.  Discussed with pt importance of energy and pain conservation and spending time OOB in recliner between therapies (short breaks between therapies but also resting in bed during longer breaks); also educated pt on benefits of OOB to allow his vascular system to adjust to gravity to minimize orthostasis/effects of deconditioning.  Pt agreeable to sitting up in recliner for 30 min.  Transferred stand pivot w/c > recliner with RW and max A with verbal cues for sequencing and assistance for lifting during sit > stand and controlled stand > sit.  Pt positioned in recliner with all items within reach. Pt reporting nausea but declining medication for nausea.  RN notified.  Second session: Pt resting in bed; wife present and able to confirm house set up details and pt functional level  PTA.  Pt fatigued but willing to  participate.  Performed supine > sit with bed rail and max A.  Performed sit > stand from elevated bed with mod A.  Pt performed ambulation forwards and retro x 5' each direction but began to c/o sudden HA and lightheadedness.  Returned to sitting on bed and BP assessed 140/65, HR: 100, Sp02:98%.  Returned to supine but as pt transitioned he cried out in pain and yelled, "Sh** that hurts my back!".  Pt and wife reporting that low back pain is new and is very severe.  Pt repositioned and left with all items within reach.  Alerted RN to new onset of severe back pain and concern of further mets.  RN to alert PA.       PT Evaluation Precautions/Restrictions Precautions Precautions: Fall Precaution Comments: Notify physician for pulse less than 55 or greater than 120, respiratory rate less than 12 or greater than 25, temperature greater than 100.5 F, urinary output less than 30 mL/hr for four hours, systolic BP less than 90 or greater than 650, diastolic BP less than 60 or greater than 100.  Prostate CA with mets to pelvis; pain; AFIB and CHF; monitor vitals Restrictions Weight Bearing Restrictions: No Other Position/Activity Restrictions: PA states no activity restrictions; exercise to pt pain tolerance; may want to avoid resisted exercises or use of Nustep secondary to pain (pt reports use of bike and Nustep at cardiac rehab were very painful) General Chart Reviewed: Yes Response to Previous Treatment: Patient reporting fatigue but able to participate. Family/Caregiver Present: No  Vital SignsTherapy Vitals Pulse Rate: 95 BP: 111/69 mmHg Patient Position, if appropriate: Sitting Pain Pain Assessment Pain Assessment: 0-10 Pain Score: 5  Pain Type: Acute pain Pain Location: Hip Pain Orientation: Right Pain Radiating Towards: thigh Pain Descriptors / Indicators: Aching;Sore;Throbbing Pain Onset: With Activity Patients Stated Pain Goal: 1 Pain Intervention(s): Repositioned;Rest (premedicated  prior to therapy) Home Living/Prior Functioning Home Living Available Help at Discharge: Available PRN/intermittently;Family (Wife works; pt alone during the day) Type of Home: House Home Access: Stairs to enter Technical brewer of Steps: 1 Entrance Stairs-Rails: None Home Layout: Multi-level;Able to live on main level with bedroom/bathroom Additional Comments: Pt was using RW immediately PTA following cardiac surgery and was attending cardiac rehab when pain in R hip began  Lives With: Spouse Prior Function Level of Independence: Independent with gait;Independent with transfers;Requires assistive device for independence  Able to Take Stairs?: Yes Driving: Yes but not since September Vocation: Other (comment) (out of work secondary to cardiac surgery) Cognition Attention: Sustained (distracted by pain) Sustained Attention: Impaired Sustained Attention Impairment: Functional basic Sensation Sensation Light Touch: Appears Intact Stereognosis: Not tested Hot/Cold: Not tested Proprioception: Appears Intact Coordination Gross Motor Movements are Fluid and Coordinated: Not tested Motor  Motor Motor: Other (comment);Abnormal postural alignment and control Motor - Skilled Clinical Observations: Generalized weakness and deconditioning; sits with thoracic kyphosis, decreased lumbar lordosis and posterior bias in sitting and standing; decresed weight shift to R secondary to pain  Mobility Bed Mobility Bed Mobility: Supine to Sit Supine to Sit: 1: +1 Total assist;HOB flat Supine to Sit Details (indicate cue type and reason): Secondary to pain pt required total A and verbal cues to initiate supine > sit and for sequencing; required assistance to lower LE to floor and lifting assistance to transition side > sit EOB secondary to posterior lean/bias Transfers Transfers: Yes Stand Pivot Transfers: 2: Max assist;From elevated surface;With armrests Stand Pivot Transfer Details (indicate  cue  type and reason): Required lifting assistance from elevated bed and from w/c with verbal cues for safe hand placement on arm rests; during stand pivot pt required mod-max A for balance secondary to posterior lean/bias and required max verbal cues for sequencing of pivot and to pivot fully before transitioning stand > sit; required assistance to control stand > sit Locomotion  Ambulation Ambulation/Gait Assistance: 3: Mod assist Ambulation Distance (Feet): 3 Feet Ambulation/Gait Assistance Details: Required mod A for gait in controlled environment with RW x 3' secondary to posterior lean/bias, pain and poor endurance.  During gait pt reporting lightheadedness.  BP assessed in sitting and PA notified of low BP. Gait Gait: Yes Gait Pattern: Impaired Gait Pattern: Step-to pattern;Decreased stance time - right;Decreased step length - right;Decreased step length - left;Decreased stride length;Decreased hip/knee flexion - right;Decreased weight shift to right;Shuffle;Antalgic;Decreased trunk rotation;Trunk flexed Stairs / Additional Locomotion Stairs: No (secondary to pain in AM) Wheelchair Mobility Wheelchair Mobility: Yes Wheelchair Assistance: 5: Careers information officer: Both upper extremities Wheelchair Parts Management: Needs assistance Distance: Pt initially required total A for w/c to gym x 150'; when cued to begin w/c propulsion with UE back to room pt only able to perform 10' before reporting fatigue; returned to room with total A  Trunk/Postural Assessment  Cervical Assessment Cervical Assessment: Within Functional Limits Thoracic Assessment Thoracic Assessment: Exceptions to [facility] (kyphotic) Lumbar Assessment Lumbar Assessment: Exceptions to [facility] (decreased lordosis with posterior lean/bias) Postural Control Postural Control: Deficits on evaluation (Posterior lean/bias in sitting and standing; no ankle or hip balance reactions observed; very guarded due to pain)   Balance Balance Balance Assessed: Yes Static Sitting Balance Static Sitting - Balance Support: Right upper extremity supported;Left upper extremity supported Static Sitting - Level of Assistance: 3: Mod assist Dynamic Sitting Balance Dynamic Sitting - Balance Support: Right upper extremity supported;Left upper extremity supported Dynamic Sitting - Level of Assistance: 2: Max assist Static Standing Balance Static Standing - Balance Support: Right upper extremity supported;Left upper extremity supported Static Standing - Level of Assistance: 3: Mod assist Dynamic Standing Balance Dynamic Standing - Balance Support: Right upper extremity supported;Left upper extremity supported Dynamic Standing - Level of Assistance: 3: Mod assist Extremity Assessment  RLE Assessment RLE Assessment: Exceptions to [facility] RLE Strength RLE Overall Strength: Deficits;Due to pain RLE Overall Strength Comments: 3/5 hip flexion, 2/5 knee extension, 4/5 knee flexion and ankle DF LLE Assessment LLE Assessment: Exceptions to [facility] LLE Strength LLE Overall Strength: Deficits LLE Overall Strength Comments: 4/5 hip flexion, 2/5 knee extension, 4/5 knee flexion and ankle DF  FIM:  FIM - Bed/Chair Transfer Bed/Chair Transfer Assistive Devices: Adult nurse Transfer: 1: Supine > Sit: Total A (helper does all/Pt. < 25%);2: Bed > Chair or W/C: Max A (lift and lower assist);2: Chair or W/C > Bed: Max A (lift and lower assist) FIM - Locomotion: Wheelchair Distance: Pt initially required total A for w/c to gym x 150'; when cued to begin w/c propulsion with UE back to room pt only able to perform 10' before reporting fatigue; returned to room with total A Locomotion: Wheelchair: 1: Total Assistance/staff pushes wheelchair (Pt<25%) FIM - Locomotion: Ambulation Locomotion: Ambulation Assistive Devices: Administrator Ambulation/Gait Assistance: 3: Mod assist Locomotion: Ambulation: 1: Travels less than 50 ft with  moderate assistance (Pt: 50 - 74%)   Refer to Care Plan for Long Term Goals  Recommendations for other services: Neuropsych  Discharge Criteria: Patient will be discharged from PT if patient refuses treatment 3 consecutive times without  medical reason, if treatment goals not met, if there is a change in medical status, if patient makes no progress towards goals or if patient is discharged from hospital.  The above assessment, treatment plan, treatment alternatives and goals were discussed and mutually agreed upon: by patient  Malachy Mood 03/28/2014, 10:03 AM

## 2014-03-28 NOTE — Evaluation (Signed)
Occupational Therapy Assessment and Plan  Patient Details  Name: Lucas Green MRN: [id number] Date of Birth: 09-30-1932  OT Diagnosis: abnormal posture, acute pain and muscle weakness (generalized) Rehab Potential: Rehab Potential: Good ELOS: 10-12 days   Today's Date: 03/28/2014 Time: 1000-1100 Time Calculation (min): 60 min  Problem List:  Patient Active Problem List   Diagnosis Date Noted  . Physical deconditioning 03/27/2014  . Intractable pain 03/22/2014  . Pathologic pelvic fracture 03/22/2014  . S/P TAVR (transcatheter aortic valve replacement) 01/14/2014  . Acute on chronic systolic and diastolic heart failure, NYHA class 4 01/07/2014  . Severe aortic stenosis   . PAF (paroxysmal atrial fibrillation)   . Chronic diastolic congestive heart failure   . Acute on chronic diastolic heart failure   . Aortic valve disorders 01/01/2014  . Pleural effusion, left 12/30/2013  . Hypokalemia 12/30/2013  . CAP (community acquired pneumonia) 12/30/2013  . Nausea and vomiting 12/30/2013  . Dyslipidemia 12/30/2013  . Weakness 12/30/2013  . Failure to thrive 12/30/2013  . Moderate protein-calorie malnutrition 12/30/2013  . Bone metastases 09/24/2013  . Metastasis from malignant tumor of prostate   . Renal insufficiency 04/02/2013  . Hypertension 03/29/2013  . H/O: CVA (cerebrovascular accident) April 2014 03/29/2013  . Prostate cancer with bone mets 09/11/2009    Past Medical History:  Past Medical History  Diagnosis Date  . Hypertension   . GERD (gastroesophageal reflux disease)   . Dyslipidemia   . Aortic stenosis   . Prostate cancer 09/2009  . 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 Hearing loss   . Incontinence of urine   . 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 Degenerative arthritis     osteoarthritis  . Stroke 03/29/13  . Hx of radiation therapy 09/30/13- 10/11/13    right hip/ischium, 3000 cGy 10 sessions  . Esophageal stricture   . Hiatal hernia   .  Diverticulosis   . Moderate protein-calorie malnutrition 12/30/2013  . Failure to thrive 12/30/2013  . Pleural effusion, left 12/30/2013  . Severe aortic stenosis   . Atrial fibrillation   . Chronic diastolic congestive heart failure   . Acute on chronic diastolic heart failure   . 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 Tonsillectomy  1938  . Cryosurgery prostate  09/2009    prostate cancer, Gleason 7  . Intraoperative transesophageal echocardiogram N/A 01/14/2014    Procedure: INTRAOPERATIVE TRANSESOPHAGEAL ECHOCARDIOGRAM;  Surgeon: Rexene Alberts, MD;  Location: [place];  Service: Open Heart Surgery;  Laterality: N/A;  . Transcatheter aortic valve replacement, transaortic N/A 01/14/2014    Procedure: TRANSCATHETER AORTIC VALVE REPLACEMENT, TRANSAORTIC;  Surgeon: Rexene Alberts, MD;  Location: [place];  Service: Open Heart Surgery;  Laterality: N/A;  . Chest tube insertion Left 01/14/2014    Procedure: CHEST TUBE INSERTION;  Surgeon: Rexene Alberts, MD;  Location: [place];  Service: Open Heart Surgery;  Laterality: Left;    Assessment & Plan Clinical Impression: Patient is a 80 y.o. year old male with a history of Metastatic Prostate Cancer, recent TVAR due to sever AVS, PAF- coumadin Rx, Chronic mixed CHF, prior CVA who presents to the ED on 03/21/14 with reports of 2-3 week history of progressive right hip pain with inability to stand  on RLE as well as intractable Right hip pain X 24 hours. X-rays of right Hip revealed progressive destructive bone changes of right anterior iliac spine with pathologic fractures involving the right superior and inferior pubic rami. He was started on narcotics for pain management and Radiation Onc consulted for input. He was evaluated by Dr. Lisbeth Renshaw  and XRT recommended for pain management--last treatment today. He continues to have poor activity tolerance due to severe pain.   Patient transferred to CIR on 03/27/2014 .    Patient currently requires max with basic self-care skills secondary to muscle weakness.  Prior to hospitalization, patient could complete BADL with modified independent .  Patient will benefit from skilled intervention to increase independence with basic self-care skills prior to discharge home with care partner.  Anticipate patient will require minimal physical assistance and follow up home health.  OT - End of Session Activity Tolerance: Tolerates 10 - 20 min activity with multiple rests Endurance Deficit: Yes Endurance Deficit Description: recent radiation, metastatic CA, recent cardiac surgery--was in cardiac rehab, deconditioned, pain OT Assessment Rehab Potential: Good OT Patient demonstrates impairments in the following area(s): Balance;Endurance;Pain;Safety OT Basic ADL's Functional Problem(s): Grooming;Bathing;Dressing;Toileting OT Transfers Functional Problem(s): Toilet;Tub/Shower OT Plan OT Intensity: Minimum of 1-2 x/day, 45 to 90 minutes OT Frequency: 5 out of 7 days OT Duration/Estimated Length of Stay: 7-10 days OT Treatment/Interventions: Balance/vestibular training;UE/LE Coordination activities;Therapeutic Activities;Therapeutic Exercise;UE/LE Strength taining/ROM;Patient/family education;Pain management;Discharge planning;Functional mobility training;Self Care/advanced ADL retraining OT Self Feeding Anticipated Outcome(s): Independent OT Basic Self-Care Anticipated Outcome(s): Min A OT Toileting Anticipated Outcome(s): Supervision OT Bathroom Transfers Anticipated Outcome(s): Supervision OT Recommendation Patient destination: Home Follow Up Recommendations: Home health OT Equipment Recommended: To be determined  Skilled Therapeutic Intervention 1:1 OT initial evaluation completed with treatment  provided to address improved positioning to reduce pain, transfers (to/from toilet, to bed), and initial patient education on use of AE/DME for safety and falls prevention.   Patient reported minimal pain while sitting in recliner but related onset of severe pain during functional activities.  Patient tolerated assessment and requested assist with toilet transfer.   Pt required max assist to rise from recliner placed beside toilet, using BSC over toilet as safety frame.  Given extra time, patient attempted BM but reported being unproductive and requested return to bed.  Using recliner modified with additional cushion, patient completed SPT to bed with mod assist and recovered to supine from sitting at edge of bed with only min assist.   Patient left in bed, call light and phone placed within reach.  OT Evaluation Precautions/Restrictions  Precautions Precautions: Fall Precaution Comments: Notify physician for pulse less than 55 or greater than 120, respiratory rate less than 12 or greater than 25, temperature greater than 100.5 F, urinary output less than 30 mL/hr for four hours, systolic BP less than 90 or greater than 076, diastolic BP less than 60 or greater than 100.  Prostate CA with mets to pelvis; pain; AFIB and CHF; monitor vitals Restrictions Weight Bearing Restrictions: No Other Position/Activity Restrictions: PA states no activity restrictions; exercise to pt pain tolerance; may want to avoid resisted exercises or use of Nustep secondary to pain (pt reports use of bike and Nustep at cardiac rehab were very painful)  General Chart Reviewed: Yes Family/Caregiver Present: No  Vital Signs Therapy Vitals Pulse Rate: 95 BP: 111/69 mmHg Patient Position, if appropriate: Sitting  Pain Pain Assessment Pain Assessment: 0-10 Pain Score: 2  Pain Type: Acute pain Pain Location: Hip Pain Orientation: Right  Pain Radiating Towards: thigh Pain Descriptors / Indicators:  Aching;Sore;Throbbing Pain Onset: On-going Patients Stated Pain Goal: 1 Pain Intervention(s): Medication (See eMAR)  Home Living/[place] expects to be discharged to:: Private residence Living Arrangements: Spouse/significant other Available Help at Discharge: Available PRN/intermittently;Family Type of Home: House Home Access: Stairs to enter [employer] of Steps: 1 Entrance Stairs-Rails: None Home Layout: Multi-level;Able to live on main level with bedroom/bathroom Alternate Level Stairs-Number of Steps: rarely accesses basement or upper level Additional Comments: Pt was using RW immediately PTA following cardiac surgery and was attending cardiac rehab when pain in R hip began  Lives With: Spouse IADL History Homemaking Responsibilities: Yes Meal Prep Responsibility: No Laundry Responsibility: No Cleaning Responsibility: No Bill Paying/Finance Responsibility: No Shopping Responsibility: Secondary Child Care Responsibility: No Current License: No Education: MD 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 Level of Independence: Independent with gait;Independent with transfers;Requires assistive device for independence  Able to Take Stairs?: Yes (avoiding stairs recently) Driving: No Vocation: Retired Comments: using cane and RW for ambulation  ADL ADL ADL Comments: see FIM  Vision/Perception  Vision- History Baseline Vision/History: Wears glasses Wears Glasses: Reading only Patient Visual Report: No change from baseline Vision- Assessment Vision Assessment?: No apparent visual deficits   Cognition Overall Cognitive Status: Within Functional Limits for tasks assessed Arousal/Alertness: Awake/alert Orientation Level: Oriented X4 Attention: Selective Sustained Attention: Appears intact Sustained Attention Impairment: Functional basic Selective Attention: Appears intact Memory: Appears  intact Awareness: Appears intact Problem Solving: Appears intact Safety/Judgment: Appears intac t Sensation Sensation Light Touch: Appears Intact Stereognosis: Appears Intact Hot/Cold: Appears Intact Proprioception: Appears Intact Coordination Gross Motor Movements are Fluid and Coordinated: Yes Fine Motor Movements are Fluid and Coordinated: Yes  Motor  Motor Motor: Other (comment);Abnormal postural alignment and control Motor - Skilled Clinical Observations: Generalized weakness and deconditioning; sits with thoracic kyphosis, decreased lumbar lordosis and posterior bias in sitting and standing; decresed weight shift to R secondary to pain  Mobility  Bed Mobility Bed Mobility: Supine to Sit Supine to Sit: 1: +1 Total assist;HOB flat Supine to Sit Details (indicate cue type and reason): Secondary to pain pt required total A and verbal cues to initiate supine > sit and for sequencing; required assistance to lower LE to floor and lifting assistance to transition side > sit EOB secondary to posterior lean/bias Sit to Supine: 4: Min guard (using bed rails) Transfers Transfers: Sit to Stand;Stand to Sit Sit to Stand: 3: Mod assist Sit to Stand Details: Manual facilitation for weight shifting Sit to Stand Details (indicate cue type and reason): extra time and elevated surfaces required (knees below level of hips). Stand to Sit: 3: Mod assist Stand to Sit Details (indicate cue type and reason): Verbal cues for precautions/safety   Trunk/Postural Assessment  Cervical Assessment Cervical Assessment: Within Functional Limits Thoracic Assessment Thoracic Assessment: Exceptions to [facility] (kyphotic) Lumbar Assessment Lumbar Assessment: Exceptions to [facility] (decreased lordosis with posterior lean/bias) Postural Control Postural Control: Deficits on evaluation (Posterior lean/bias in sitting and standing; no ankle or hip balance reactions observed; very guarded due to pain)    Balance Balance Balance Assessed: Yes Static Sitting Balance Static Sitting - Balance Support: Right upper extremity supported;Left upper extremity supported Static Sitting - Level of Assistance: 3: Mod assist Dynamic Sitting Balance Dynamic Sitting - Balance Support: Right upper extremity supported;Left upper extremity supported Dynamic Sitting - Level of Assistance: 2: Max assist Static Standing Balance Static Standing - Balance Support: Right upper extremity  supported;Left upper extremity supported Static Standing - Level of Assistance: 3: Mod assist Dynamic Standing Balance Dynamic Standing - Balance Support: Right upper extremity supported;Left upper extremity supported Dynamic Standing - Level of Assistance: 3: Mod assist  Extremity/Trunk Assessment RUE Assessment RUE Assessment: Exceptions to [facility] RUE AROM (degrees) Right Shoulder Flexion: 75 Degrees RUE Strength RUE Overall Strength: Deficits (3+/5) LUE Assessment LUE Assessment: Exceptions to [facility] LUE Strength LUE Overall Strength: Deficits (grossly 3+/5)  FIM:  FIM - Grooming Grooming: 0: Activity did not occur FIM - Bathing Bathing: 0: Activity did not occur FIM - Upper Body Dressing/Undressing Upper body dressing/undressing: 0: Wears gown/pajamas-no public clothing FIM - Toileting Toileting steps completed by patient: Adjust clothing prior to toileting;Performs perineal hygiene;Adjust clothing after toileting Toileting: 5: Set-up assist to: Obtain supplies FIM - Bed/Chair Transfer Bed/Chair Transfer Assistive Devices: Bed rails;Arm rests Bed/Chair Transfer: 4: Sit > Supine: Min A (steadying pt. > 75%/lift 1 leg);3: Chair or W/C > Bed: Mod A (lift or lower assist) FIM - Radio producer Devices: Grab bars;Bedside commode Toilet Transfers: 2-To toilet/BSC: Max A (lift and lower assist);2-From toilet/BSC: Max A (lift and lower assist)   Refer to Care Plan for Long Term  Goals  Recommendations for other services: None  Discharge Criteria: Patient will be discharged from OT if patient refuses treatment 3 consecutive times without medical reason, if treatment goals not met, if there is a change in medical status, if patient makes no progress towards goals or if patient is discharged from hospital.  The above assessment, treatment plan, treatment alternatives and goals were discussed and mutually agreed upon: by patient  Salome Spotted 03/28/2014, 4:08 PM

## 2014-03-28 NOTE — Progress Notes (Signed)
Patient information reviewed and entered into eRehab system by Damesha Lawler, RN, CRRN, PPS Coordinator.  Information including medical coding and functional independence measure will be reviewed and updated through discharge.     Per nursing patient was given "Data Collection Information Summary for Patients in Inpatient Rehabilitation Facilities with attached "Privacy Act Statement-Health Care Records" upon admission.  

## 2014-03-28 NOTE — Progress Notes (Signed)
Occupational Therapy Session Note  Patient Details  Name: Lucas Green MRN: [id number] Date of Birth: 1932/01/22  Today's Date: 03/28/2014 Time:  - 1515-1600   (58min)    Short Term Goals: Week 1:  OT Short Term Goal 1 (Week 1): Patient will complete transfer in/out of walk-in shower with mod assist OT Short Term Goal 2 (Week 1): Patient will complete lower body bathing sitting and standing using AE, prn, with mod assist OT Short Term Goal 3 (Week 1): Patient will complete upper body bathing and dressing seated at sink with min assist OT Short Term Goal 4 (Week 1): Patient will perform 50% of clothing management task for toileting standing at RW with steadying assist  Skilled Therapeutic Interventions/Progress Updates:    Pt lying in bed upon OT arrival.  [place].es of being nauseated with no actual vomiting during session.  R hip pain 2/10.  Agreed to do UE AROM, 2# weight on the LUE.  Pt has Right OA and shoulder is limited with flexion and abduction.  Did not use weight on RUE except for elbow.  .  Did LUE AROM in all planes with 2 # weights.  Pt. Needed frequent rest breaks and was 3/4 on dyspnea scale.  Provided some cold ginger ale at end of session.  Wife present.  Left pt with call bell in reach and in supine position.      Therapy Documentation Precautions:  Precautions Precautions: Fall Precaution Comments: Notify physician for pulse less than 55 or greater than 120, respiratory rate less than 12 or greater than 25, temperature greater than 100.5 F, urinary output less than 30 mL/hr for four hours, systolic BP less than 90 or greater than 009, diastolic BP less than 60 or greater than 100.  Prostate CA with mets to pelvis; pain; AFIB and CHF; monitor vitals Restrictions Weight Bearing Restrictions: No Other Position/Activity Restrictions: PA states no activity restrictions; exercise to pt pain tolerance; may want to avoid resisted exercises or use of Nustep secondary to pain  (pt reports use of bike and Nustep at cardiac rehab were very painful) General:   Vital Signs: Therapy Vitals  Patient Position, if appropriate: Lying Oxygen Therapy SpO2: 95 % O2 Device: None (Room air) Pain:  See above   ADL: ADL ADL Comments: see FIM    See FIM for current functional status  Therapy/Group: Individual Therapy  Lisa Roca 03/28/2014, 3:57 PM

## 2014-03-29 ENCOUNTER — Inpatient Hospital Stay (HOSPITAL_COMMUNITY): Payer: Medicare Other | Admitting: Occupational Therapy

## 2014-03-29 LAB — PROTIME-INR
INR: 2.98 — ABNORMAL HIGH (ref 0.00–1.49)
PROTHROMBIN TIME: 29.9 s — AB (ref 11.6–15.2)

## 2014-03-29 MED ORDER — WARFARIN SODIUM 3 MG PO TABS
3.0000 mg | ORAL_TABLET | Freq: Once | ORAL | Status: AC
  Administered 2014-03-29: 3 mg via ORAL
  Filled 2014-03-29: qty 1

## 2014-03-29 MED ORDER — LIDOCAINE 5 % EX PTCH
3.0000 | MEDICATED_PATCH | Freq: Every day | CUTANEOUS | Status: DC
  Administered 2014-03-29: 1 via TRANSDERMAL
  Administered 2014-03-30 – 2014-04-10 (×12): 3 via TRANSDERMAL
  Filled 2014-03-29 (×16): qty 3

## 2014-03-29 NOTE — Progress Notes (Signed)
Occupational Therapy Session Note  Patient Details  Name: Lucas Green MRN: [id number] Date of Birth: [birth year]  Today's Date: 03/29/2014 Time: 3846-6599 Time Calculation (min): 53 min  Skilled Therapeutic Interventions/Progress Updates:  Patient seen for bed mobility with Max A;   Bed to 3:1 transfer with Mod x2 to transfer and 3:1 to Bed transfer with [name] (due to patient overall very fatigued and in LEs  and unable to transfer after BM) .  Toileting = Total A x 2;      Wife present for toileting and transfer back to bed.  Patient left in bed with call bell, tray table and phone in place and supportive wife present.  Therapy Documentation Precautions:  Precautions Precautions: Fall Precaution Comments: Notify physician for pulse less than 55 or greater than 120, respiratory rate less than 12 or greater than 25, temperature greater than 100.5 F, urinary output less than 30 mL/hr for four hours, systolic BP less than 90 or greater than 357, diastolic BP less than 60 or greater than 100.  Prostate CA with mets to pelvis; pain; AFIB and CHF; monitor vitals Restrictions Weight Bearing Restrictions: No (one person assist) Other Position/Activity Restrictions: PA states no activity restrictions; exercise to pt pain tolerance; may want to avoid resisted exercises or use of Nustep secondary to pain (pt reports use of bike and Nustep at cardiac rehab were very painful)  Pain: denied with pain patches in place    See FIM for current functional status  Therapy/Group: Individual Therapy  Herschell Dimes 03/29/2014, 4:57 PM

## 2014-03-29 NOTE — Progress Notes (Signed)
ANTICOAGULATION CONSULT NOTE - Follow Up Consult  Pharmacy Consult for Coumadin Indication: atrial fibrillation  Allergies  Allergen Reactions  . Other     Beer and Wine- swelling/headache/nausea    Patient Measurements: Height: 6' 2.02" (188 cm) Weight: 192 lb 14.4 oz (87.5 kg) IBW/kg (Calculated) : 82.24  Vital Signs: Temp: 98.3 F (36.8 C) (04/18 0539) Temp src: Axillary (04/18 0539) BP: 118/70 mmHg (04/18 0539) Pulse Rate: 86 (04/18 0539)  Labs:  Recent Labs  03/27/14 0335 03/28/14 0713 03/29/14 0640  HGB  --  10.1*  --   HCT  --  29.4*  --   PLT  --  326  --   LABPROT 24.3* 30.3* 29.9*  INR 2.27* 3.03* 2.98*  CREATININE  --  0.88  --     Estimated Creatinine Clearance: 79.1 ml/min (by C-G formula based on Cr of 0.88).  Assessment: 80 y.o. male on coumadin for afib. INR therapeutic - remains at upper end of therapeutic range. Appears pt may need lower than previous home dose of 6mg  daily. No bleeding noted.  Goal of Therapy:  INR 2-3 Monitor platelets by anticoagulation protocol: Yes   Plan:  1. Coumadin 3mg  po again today 2. F/u daily INR  Sherlon Handing, PharmD, BCPS Clinical pharmacist, pager [phone] 03/29/2014,8:49 AM

## 2014-03-29 NOTE — IPOC Note (Signed)
Overall Plan of Care [facility]) Patient Details Name: Lucas Green MRN: [id number] DOB: [birth year]  Admitting Diagnosis: mestatic prostate cancer to pelvis  Hospital Problems: Active Problems:   Physical deconditioning     Functional Problem List: Nursing Bowel;Medication Management;Pain;Nutrition;Safety;Skin Integrity  PT Balance;Endurance;Motor;Pain  OT Balance;Endurance;Pain;Safety  SLP    TR         Basic ADL's: OT Grooming;Bathing;Dressing;Toileting     Advanced  ADL's: OT       Transfers: PT Bed Mobility;Bed to Chair;Car  OT Toilet;Tub/Shower     Locomotion: PT Ambulation;Wheelchair Mobility;Stairs     Additional Impairments: OT    SLP        TR      Anticipated Outcomes Item Anticipated Outcome  Self Feeding Independent  Swallowing      Basic self-care  Min A  Toileting  Supervision   Bathroom Transfers Supervision  Bowel/Bladder  Bowel and bladder with minimal assistance..  Transfers  Supervision  Locomotion  Supervision short distances with RW  Communication     Cognition     Pain  Pain level less than3 on a scale of 0-10.  Safety/Judgment  Safety/Judgement with minimal assistance.   Therapy Plan: PT Intensity: Minimum of 1-2 x/day ,45 to 90 minutes PT Frequency: 5 out of 7 days PT Duration Estimated Length of Stay: 12-14 days OT Intensity: Minimum of 1-2 x/day, 45 to 90 minutes OT Frequency: 5 out of 7 days OT Duration/Estimated Length of Stay: 10-12 days         Team Interventions: Nursing Interventions Patient/Family Education;Bowel Management;Disease Management/Prevention;Pain Management;Medication Management;Skin Care/Wound Management;Discharge Planning  PT interventions Ambulation/gait training;Balance/vestibular training;Discharge planning;Disease management/prevention;DME/adaptive equipment instruction;Functional mobility training;Neuromuscular re-education;Pain management;Patient/family education;Psychosocial  support;Stair training;Therapeutic Activities;Therapeutic Exercise;UE/LE Strength taining/ROM;Wheelchair propulsion/positioning;Cognitive remediation/compensation  OT Interventions Balance/vestibular training;UE/LE Coordination activities;Therapeutic Activities;Therapeutic Exercise;UE/LE Strength taining/ROM;Patient/family education;Pain management;Discharge planning;Functional mobility training;Self Care/advanced ADL retraining  SLP Interventions    TR Interventions    SW/CM Interventions      Team Discharge Planning: Destination: PT-Home ,OT- Assisted Living , SLP-  Projected Follow-up: PT-Home health PT;Skilled nursing facility (HHPT vs. SNF if 24/7 not available), OT-  Home health OT, SLP-  Projected Equipment Needs: PT-Wheelchair cushion (measurements);Wheelchair (measurements), OT- To be determined, SLP-  Equipment Details: PT- , OT-  Patient/family involved in discharge planning: PT- Patient,  OT-Patient, SLP-   MD ELOS: 12 days Medical Rehab Prognosis:  Excellent Assessment: The patient has been admitted for CIR therapies. The team will be addressing functional mobility, strength, stamina, balance, safety, adaptive techniques and equipment, self-care, bowel and bladder mgt, patient and caregiver education, pain mgt, egosupport. Goals have been set at supervision for self-care and mobility. Wife is available for assist at home as needed.Meredith Staggers, MD, FAAPMR      See Team Conference Notes for weekly updates to the plan of care

## 2014-03-29 NOTE — Progress Notes (Signed)
[facility] PHYSICAL MEDICINE & REHABILITATION     PROGRESS NOTE    Subjective/Complaints: Pt doing a little better with pain. Would like another lidoderm patch to cover all areas where he's having pain. Had some communication issues with his nurse last night. Still constipated. A 12 point review of systems has been performed and if not noted above is otherwise negative.   Objective: Vital Signs: Blood pressure 118/70, pulse 86, temperature 98.3 F (36.8 C), temperature source Axillary, resp. rate 16, height 6' 2.02" (1.88 m), weight 87.5 kg (192 lb 14.4 oz), SpO2 96.00%. Dg Abd 1 View  03/28/2014   CLINICAL DATA:  Constipation, no bowel movement for 3 days  EXAM: ABDOMEN - 1 VIEW  COMPARISON:  CT CTA ABD/PEL W/CM AND/OR W/O CM dated 01/06/2014  FINDINGS: There is no bowel dilatation to suggest obstruction. There is no evidence of pneumoperitoneum, portal venous gas or pneumatosis. There are no pathologic calcifications along the expected course of the ureters.  There are pathologic fractures involving the right superior and inferior pubic rami.  IMPRESSION: 1. No bowel obstruction. 2. Pathologic fractures again noted involving the right superior and inferior pubic rami.   Electronically Signed   By: Kathreen Devoid   On: 03/28/2014 00:52    Recent Labs  03/28/14 0713  WBC 9.0  HGB 10.1*  HCT 29.4*  PLT 326    Recent Labs  03/28/14 0713  NA 128*  K 4.4  CL 92*  GLUCOSE 111*  BUN 22  CREATININE 0.88  CALCIUM 9.1   CBG (last 3)  No results found for this basename: GLUCAP,  in the last 72 hours  Wt Readings from Last 3 Encounters:  03/27/14 87.5 kg (192 lb 14.4 oz)  03/24/14 87.5 kg (192 lb 14.4 oz)  03/06/14 87.091 kg (192 lb)    Physical Exam:  Nursing note and vitals reviewed.  Constitutional: He is oriented to person, place, and time. He appears well-developed and well-nourished.  HENT:  Head: Normocephalic and atraumatic.  Eyes: Conjunctivae are normal. Pupils are  equal, round, and reactive to light.  Neck: Normal range of motion. Neck supple.  Cardiovascular: Normal rate and regular rhythm. No murmurs, rubs, or gallops  No murmur heard.  Respiratory: Effort normal and breath sounds normal. No respiratory distress. He has no wheezes.  GI: Soft. Bowel sounds are normal.  Neurological: He is alert and oriented to person, place, and time. UES nearly 5/5. RLE 1+ HF, 2 KE and 4/5 ankle. LLE 2+ HF, 3+ KE and 4+ ankle. No sensory findings. CN exam normal. DTR's 1+. Cognitively appropriate Musc: right hip/groin tender to PROM and AROM.  Skin: Skin is warm and dry.  Psychiatric: His behavior is normal. Thought content normal. His mood appears anxious.    Assessment/Plan: 1. Functional deficits secondary to metastatic prostate cancer to the pelvis which require 3+ hours per day of interdisciplinary therapy in a comprehensive inpatient rehab setting. Physiatrist is providing close team supervision and 24 hour management of active medical problems listed below. Physiatrist and rehab team continue to assess barriers to discharge/monitor patient progress toward functional and medical goals. FIM: FIM - Bathing Bathing: 0: Activity did not occur  FIM - Upper Body Dressing/Undressing Upper body dressing/undressing: 0: Wears gown/pajamas-no public clothing FIM - Lower Body Dressing/Undressing Lower body dressing/undressing: 0: Wears gown/pajamas-no public clothing  FIM - Toileting Toileting steps completed by patient: Adjust clothing prior to toileting;Performs perineal hygiene;Adjust clothing after toileting Toileting: 5: Set-up assist to: Obtain supplies  FIM - Radio producer Devices: Grab bars;Bedside commode Toilet Transfers: 2-To toilet/BSC: Max A (lift and lower assist);2-From toilet/BSC: Max A (lift and lower assist)  FIM - Engineer, site Assistive Devices: Bed rails;Arm rests Bed/Chair Transfer: 4:  Sit > Supine: Min A (steadying pt. > 75%/lift 1 leg);3: Chair or W/C > Bed: Mod A (lift or lower assist)  FIM - Locomotion: Wheelchair Distance: Pt initially required total A for w/c to gym x 150'; when cued to begin w/c propulsion with UE back to room pt only able to perform 10' before reporting fatigue; returned to room with total A Locomotion: Wheelchair: 1: Total Assistance/staff pushes wheelchair (Pt<25%) FIM - Locomotion: Ambulation Locomotion: Ambulation Assistive Devices: Administrator Ambulation/Gait Assistance: 3: Mod assist Locomotion: Ambulation: 1: Travels less than 50 ft with moderate assistance (Pt: 50 - 74%)  Comprehension Comprehension Mode: Auditory Comprehension: 5-Understands complex 90% of the time/Cues < 10% of the time  Expression Expression Mode: Verbal Expression: 5-Expresses complex 90% of the time/cues < 10% of the time  Social Interaction Social Interaction: 6-Interacts appropriately with others with medication or extra time (anti-anxiety, antidepressant).  Problem Solving Problem Solving: 5-Solves basic 90% of the time/requires cueing < 10% of the time  Memory Memory: 6-More than reasonable amt of time  Medical Problem List and Plan:  Metastatic prostate cancer to the pelvis  1. DVT Prophylaxis/Anticoagulation: Pharmaceutical: Coumadin  2. Right hip pain/Pain Management: continue po dilaudid  -added ms contin as well and titrate as needed  -appropriate pacing/rest breaks etc  -increase lidoderm patches to 3 daily. 3. Mood: LCSW to follow for evaluation and support.  4. Neuropsych: This patient is capable of making decisions on his own behalf.  5. A Fib/TAVR 01/2014: Continue coumadin  6. H/O CVA: continue ASA for secondary stroke prevention.  7. NICM/Chronic combined systolic and diastolic CHF: Off diuretics and No ace due to low BP. Low salt diet. Monitor daily weights. ---not being done daily 8. Constipation: Continue miralax bid and add Senna S  additionally. Will check KUB as has some complaints of nausea  9. Chronic fatigue with anorexia: added supplements to help with energy levels and nutritional status.   LOS (Days) 2 A FACE TO FACE EVALUATION WAS PERFORMED  Meredith Staggers 03/29/2014 8:09 AM

## 2014-03-30 ENCOUNTER — Inpatient Hospital Stay (HOSPITAL_COMMUNITY): Payer: Medicare Other

## 2014-03-30 ENCOUNTER — Inpatient Hospital Stay (HOSPITAL_COMMUNITY): Payer: Medicare Other | Admitting: Occupational Therapy

## 2014-03-30 LAB — PROTIME-INR
INR: 2.86 — ABNORMAL HIGH (ref 0.00–1.49)
Prothrombin Time: 29 seconds — ABNORMAL HIGH (ref 11.6–15.2)

## 2014-03-30 MED ORDER — WARFARIN SODIUM 3 MG PO TABS
3.0000 mg | ORAL_TABLET | Freq: Once | ORAL | Status: AC
  Administered 2014-03-30: 3 mg via ORAL
  Filled 2014-03-30: qty 1

## 2014-03-30 NOTE — Progress Notes (Addendum)
Occupational Therapy Session Note  Patient Details  Name: Lucas Green MRN: [id number] Date of Birth: 1932/09/25  Today's Date: 03/30/2014 Time: 1300-1330 Time Calculation (min): 30 min  Skilled Therapeutic Interventions/Progress Updates: Patient still c/oing of extreme fatigue and nausea on afternoon approach for therapy.  He concurred to complete bed mobility and UE exercises to ease self care and overall mobility.  Wife present for session and brought in over the counter glasses as patient thinks his prescription eye glasses were lost in the laundry in the pocket of the gown he took off last night.  Patient also completed oral care withsetup.  Patient missed 70 minutes of his schedule 180 today due to c/o extreme fatigue and nausea.    Therapy Documentation Precautions:  Precautions Precautions: Fall Precaution Comments: Notify physician for pulse less than 55 or greater than 120, respiratory rate less than 12 or greater than 25, temperature greater than 100.5 F, urinary output less than 30 mL/hr for four hours, systolic BP less than 90 or greater than 793, diastolic BP less than 60 or greater than 100.  Prostate CA with mets to pelvis; pain; AFIB and CHF; monitor vitals Restrictions Weight Bearing Restrictions: No Other Position/Activity Restrictions: PA states no activity restrictions; exercise to pt pain tolerance; may want to avoid resisted exercises or use of Nustep secondary to pain (pt reports use of bike and Nustep at cardiac rehab were very painful)   Pain:denied   See FIM for current functional status  Therapy/Group: Individual Therapy  Herschell Dimes 03/30/2014, 1:26 PM

## 2014-03-30 NOTE — Progress Notes (Signed)
Coumadin per pharmacy  - 81 yoM with metastatic prostate cancer admitted 4/11 with right hip pain. Patient on warfarin PTA for AFib. Patient transferred to [facility] on 4/14 for XRT treatments. Pharmacy has been consulted to continue warfarin while inpatient. INR therapeutic - remains at upper end of therapeutic range. Appears pt may need lower than previous home dose of 6mg  daily. No bleeding noted. Hgb low but stable. INR today is 2.86  GOAL: INR 2-3  PLAN:  1. Coumadin 3mg  po again today 2. Daily INR. Monitor for bleeding complications.  3. Coumadin educated on 03/28/14

## 2014-03-30 NOTE — Progress Notes (Signed)
[place] PHYSICAL MEDICINE & REHABILITATION     PROGRESS NOTE    Subjective/Complaints: Constipated but otherwise doing ok. A 12 point review of systems has been performed and if not noted above is otherwise negative.   Objective: Vital Signs: Blood pressure 118/67, pulse 78, temperature 98 F (36.7 C), temperature source Oral, resp. rate 18, height 6' 2.02" (1.88 m), weight 87.7 kg (193 lb 5.5 oz), SpO2 99.00%. No results found.  Recent Labs  03/28/14 0713  WBC 9.0  HGB 10.1*  HCT 29.4*  PLT 326    Recent Labs  03/28/14 0713  NA 128*  K 4.4  CL 92*  GLUCOSE 111*  BUN 22  CREATININE 0.88  CALCIUM 9.1   CBG (last 3)  No results found for this basename: GLUCAP,  in the last 72 hours  Wt Readings from Last 3 Encounters:  03/30/14 87.7 kg (193 lb 5.5 oz)  03/24/14 87.5 kg (192 lb 14.4 oz)  03/06/14 87.091 kg (192 lb)    Physical Exam:  Nursing note and vitals reviewed.  Constitutional: He is oriented to person, place, and time. He appears well-developed and well-nourished.  HENT:  Head: Normocephalic and atraumatic.  Eyes: Conjunctivae are normal. Pupils are equal, round, and reactive to light.  Neck: Normal range of motion. Neck supple.  Cardiovascular: Normal rate and regular rhythm. No murmurs, rubs, or gallops  No murmur heard.  Respiratory: Effort normal and breath sounds normal. No respiratory distress. He has no wheezes.  GI: Soft. Bowel sounds are normal.  Neurological: He is alert and oriented to person, place, and time. UES nearly 5/5. RLE 1+ HF, 2 KE and 4/5 ankle. LLE 2+ HF, 3+ KE and 4+ ankle. No sensory findings. CN exam normal. DTR's 1+. Cognitively appropriate Musc: right hip/groin tender to PROM and AROM.  Skin: Skin is warm and dry.  Psychiatric: His behavior is normal. Thought content normal. His mood appears anxious.    Assessment/Plan: 1. Functional deficits secondary to metastatic prostate cancer to the pelvis which require 3+ hours  per day of interdisciplinary therapy in a comprehensive inpatient rehab setting. Physiatrist is providing close team supervision and 24 hour management of active medical problems listed below. Physiatrist and rehab team continue to assess barriers to discharge/monitor patient progress toward functional and medical goals. FIM: FIM - Bathing Bathing: 0: Activity did not occur  FIM - Upper Body Dressing/Undressing Upper body dressing/undressing: 0: Wears gown/pajamas-no public clothing FIM - Lower Body Dressing/Undressing Lower body dressing/undressing: 0: Wears gown/pajamas-no public clothing  FIM - Toileting Toileting steps completed by patient: Adjust clothing prior to toileting;Performs perineal hygiene;Adjust clothing after toileting Toileting: 5: Set-up assist to: Obtain supplies  FIM - Radio producer Devices: Grab bars;Bedside commode Toilet Transfers: 2-To toilet/BSC: Max A (lift and lower assist);2-From toilet/BSC: Max A (lift and lower assist)  FIM - Engineer, site Assistive Devices: Bed rails;Arm rests Bed/Chair Transfer: 4: Sit > Supine: Min A (steadying pt. > 75%/lift 1 leg);3: Chair or W/C > Bed: Mod A (lift or lower assist)  FIM - Locomotion: Wheelchair Distance: Pt initially required total A for w/c to gym x 150'; when cued to begin w/c propulsion with UE back to room pt only able to perform 10' before reporting fatigue; returned to room with total A Locomotion: Wheelchair: 1: Total Assistance/staff pushes wheelchair (Pt<25%) FIM - Locomotion: Ambulation Locomotion: Ambulation Assistive Devices: Administrator Ambulation/Gait Assistance: 3: Mod assist Locomotion: Ambulation: 1: Travels less than 50 ft  with moderate assistance (Pt: 50 - 74%)  Comprehension Comprehension Mode: Auditory Comprehension: 5-Understands complex 90% of the time/Cues < 10% of the time  Expression Expression Mode: Verbal Expression:  5-Expresses complex 90% of the time/cues < 10% of the time  Social Interaction Social Interaction: 6-Interacts appropriately with others with medication or extra time (anti-anxiety, antidepressant).  Problem Solving Problem Solving: 5-Solves complex 90% of the time/cues < 10% of the time  Memory Memory: 6-More than reasonable amt of time  Medical Problem List and Plan:  Metastatic prostate cancer to the pelvis  1. DVT Prophylaxis/Anticoagulation: Pharmaceutical: Coumadin  2. Right hip pain/Pain Management: improved  -continue po dilaudid  -added ms contin as well and titrate as needed  -appropriate pacing/rest breaks etc  -increased lidoderm patches to 3 daily. 3. Mood: LCSW to follow for evaluation and support.  4. Neuropsych: This patient is capable of making decisions on his own behalf.  5. A Fib/TAVR 01/2014: Continue coumadin  6. H/O CVA: continue ASA for secondary stroke prevention.  7. NICM/Chronic combined systolic and diastolic CHF: Off diuretics and No ace due to low BP. Low salt diet. Monitor daily weights. ---not being done daily 8. Constipation: Continue miralax bid and add Senna S additionally. dulc suppository and SSE prn  9. Chronic fatigue with anorexia: added supplements to help with energy levels and nutritional status.   LOS (Days) 3 A FACE TO FACE EVALUATION WAS PERFORMED  Lucas Green 03/30/2014 7:55 AM

## 2014-03-30 NOTE — Progress Notes (Signed)
Occupational Therapy Session Note  Patient Details  Name: Lucas Green MRN: [id number] Date of Birth: [birth year]  Today's Date: 03/30/2014 Time: 0840-1000 Time Calculation (min): 80 min  Skilled Therapeutic Interventions/Progress Updates: Upon approach for therapy, patient stated that he was nauseated and extremely fatigued today as compared to yesterday.  After sitting up in bed with HOB elevated to wash, after washing his face, underarms, and neck he exhibited shortness of breath and complained of fatigue and nausea.   As well, at that point, he asked the RN for "an enema and then sit on the [facility]."  Then he stated that he would prefer the bed pan (after the enema) due to nausea and fatigue .  Patient was dependent for the rest of his bathing due to c/o fatigue and nausea.  As well, he stated that he preferred wearing and brief and a gown and staying in bed in case he had more loose stools.  He said the clean up would be easier and less fatiguing in bed today.  Therapy Documentation Precautions:  Precautions Precautions: Fall Precaution Comments: Notify physician for pulse less than 55 or greater than 120, respiratory rate less than 12 or greater than 25, temperature greater than 100.5 F, urinary output less than 30 mL/hr for four hours, systolic BP less than 90 or greater than 662, diastolic BP less than 60 or greater than 100.  Prostate CA with mets to pelvis; pain; AFIB and CHF; monitor vitals Restrictions Weight Bearing Restrictions: No Other Position/Activity Restrictions: PA states no activity restrictions; exercise to pt pain tolerance; may want to avoid resisted exercises or use of Nustep secondary to pain (pt reports use of bike and Nustep at cardiac rehab were very painful)   Pain: denied See FIM for current functional status  Therapy/Group: Individual Therapy  Herschell Dimes 03/30/2014, 12:32 PM

## 2014-03-31 ENCOUNTER — Encounter (HOSPITAL_COMMUNITY): Payer: Medicare Other | Admitting: Occupational Therapy

## 2014-03-31 ENCOUNTER — Inpatient Hospital Stay (HOSPITAL_COMMUNITY): Payer: Medicare Other | Admitting: Occupational Therapy

## 2014-03-31 ENCOUNTER — Inpatient Hospital Stay (HOSPITAL_COMMUNITY): Payer: Medicare Other

## 2014-03-31 ENCOUNTER — Encounter (HOSPITAL_COMMUNITY): Payer: Medicare Other

## 2014-03-31 ENCOUNTER — Telehealth: Payer: Self-pay | Admitting: Medical Oncology

## 2014-03-31 DIAGNOSIS — C7952 Secondary malignant neoplasm of bone marrow: Secondary | ICD-10-CM

## 2014-03-31 DIAGNOSIS — J189 Pneumonia, unspecified organism: Secondary | ICD-10-CM

## 2014-03-31 DIAGNOSIS — N2881 Hypertrophy of kidney: Secondary | ICD-10-CM

## 2014-03-31 DIAGNOSIS — C7951 Secondary malignant neoplasm of bone: Secondary | ICD-10-CM

## 2014-03-31 DIAGNOSIS — R5381 Other malaise: Secondary | ICD-10-CM

## 2014-03-31 DIAGNOSIS — C61 Malignant neoplasm of prostate: Secondary | ICD-10-CM

## 2014-03-31 LAB — PROTIME-INR
INR: 2.94 — ABNORMAL HIGH (ref 0.00–1.49)
Prothrombin Time: 29.6 seconds — ABNORMAL HIGH (ref 11.6–15.2)

## 2014-03-31 MED ORDER — SENNOSIDES-DOCUSATE SODIUM 8.6-50 MG PO TABS
2.0000 | ORAL_TABLET | Freq: Two times a day (BID) | ORAL | Status: DC
  Administered 2014-03-31 – 2014-04-03 (×7): 2 via ORAL
  Filled 2014-03-31 (×12): qty 2

## 2014-03-31 MED ORDER — WARFARIN SODIUM 2 MG PO TABS
2.0000 mg | ORAL_TABLET | Freq: Once | ORAL | Status: AC
  Administered 2014-03-31: 2 mg via ORAL
  Filled 2014-03-31: qty 1

## 2014-03-31 MED ORDER — ONDANSETRON 4 MG PO TBDP
4.0000 mg | ORAL_TABLET | Freq: Two times a day (BID) | ORAL | Status: DC
  Administered 2014-03-31 – 2014-04-04 (×8): 4 mg via ORAL
  Filled 2014-03-31 (×11): qty 1

## 2014-03-31 MED ORDER — MORPHINE SULFATE ER 30 MG PO TBCR
30.0000 mg | EXTENDED_RELEASE_TABLET | Freq: Two times a day (BID) | ORAL | Status: DC
  Administered 2014-03-31 – 2014-04-10 (×20): 30 mg via ORAL
  Filled 2014-03-31 (×23): qty 1

## 2014-03-31 NOTE — Telephone Encounter (Signed)
Reesa Chew, PA @ [facility] inpatient, called stating patient is @ [facility] for rehab and wife asking for any other treatment options for patient's dx of prostate ca. Wife, Rosemarie Ax, requesting call from MD to discuss options @ [phone], she is on pt's HIPPA for authorization for release of information. MD inboxed.  Patient currently with no sched appts.  LOV 11/08/2013

## 2014-03-31 NOTE — Progress Notes (Signed)
Occupational Therapy Session Note  Patient Details  Name: Lucas Green MRN: [id number] Date of Birth: 1932/11/04  Today's Date: 03/31/2014 Time: 1130-1201 Time Calculation (min): 31 min  Short Term Goals: Week 1:  OT Short Term Goal 1 (Week 1): Patient will complete transfer in/out of walk-in shower with mod assist OT Short Term Goal 2 (Week 1): Patient will complete lower body bathing sitting and standing using AE, prn, with mod assist OT Short Term Goal 3 (Week 1): Patient will complete upper body bathing and dressing seated at sink with min assist OT Short Term Goal 4 (Week 1): Patient will perform 50% of clothing management task for toileting standing at RW with steadying assist  Skilled Therapeutic Interventions/Progress Updates:    Pt seen for 1:1 OT session with focus on bed mobility and functional transfers. Pt received supine in bed with RN present providing care. Engaged in rolling L at min assist to allow for nursing care. Pt agreeable to transfer to w/c for lunch with mod cues of encouragement. Pt completed supine>sit with increased time and mod cues at max assist. Pt required max multimodal cues for scooting in bed to allow feet to touch floor. Completed stand pivot transfer bed>w/c with mod assist and increased time. Pt required 3 attempts to complete task and manual facilitation and cues for anterior weight shift. Pt left sitting in w/c with wife present and mirror to allow pt to complete shaving task.   Therapy Documentation Precautions:  Precautions Precautions: Fall Precaution Comments: Notify physician for pulse less than 55 or greater than 120, respiratory rate less than 12 or greater than 25, temperature greater than 100.5 F, urinary output less than 30 mL/hr for four hours, systolic BP less than 90 or greater than 950, diastolic BP less than 60 or greater than 100.  Prostate CA with mets to pelvis; pain; AFIB and CHF; monitor vitals Restrictions Weight Bearing  Restrictions: No Other Position/Activity Restrictions: PA states no activity restrictions; exercise to pt pain tolerance; may want to avoid resisted exercises or use of Nustep secondary to pain (pt reports use of bike and Nustep at cardiac rehab were very painful) Vital Signs:   Pain: Pain Assessment Pain Score: 0-No pain  See FIM for current functional status  Therapy/Group: Individual Therapy  Jennamarie Goings N Areebah Meinders 03/31/2014, 12:04 PM

## 2014-03-31 NOTE — Progress Notes (Signed)
[facility] PHYSICAL MEDICINE & REHABILITATION     PROGRESS NOTE    Subjective/Complaints: PAIN still a problem. Emptied bowels a little yesterday A 12 point review of systems has been performed and if not noted above is otherwise negative.   Objective: Vital Signs: Blood pressure 157/71, pulse 83, temperature 97.8 F (36.6 C), temperature source Oral, resp. rate 16, height 6' 2.02" (1.88 m), weight 87.7 kg (193 lb 5.5 oz), SpO2 97.00%. No results found. No results found for this basename: WBC, HGB, HCT, PLT,  in the last 72 hours No results found for this basename: NA, K, CL, CO, GLUCOSE, BUN, CREATININE, CALCIUM,  in the last 72 hours CBG (last 3)  No results found for this basename: GLUCAP,  in the last 72 hours  Wt Readings from Last 3 Encounters:  03/30/14 87.7 kg (193 lb 5.5 oz)  03/24/14 87.5 kg (192 lb 14.4 oz)  03/06/14 87.091 kg (192 lb)    Physical Exam:  Nursing note and vitals reviewed.  Constitutional: He is oriented to person, place, and time. He appears well-developed and well-nourished.  HENT:  Head: Normocephalic and atraumatic.  Eyes: Conjunctivae are normal. Pupils are equal, round, and reactive to light.  Neck: Normal range of motion. Neck supple.  Cardiovascular: Normal rate and regular rhythm. No murmurs, rubs, or gallops  No murmur heard.  Respiratory: Effort normal and breath sounds normal. No respiratory distress. He has no wheezes.  GI: Soft. Bowel sounds are normal.  Neurological: He is alert and oriented to person, place, and time. UES nearly 5/5. RLE 1+ HF, 2 KE and 4/5 ankle. LLE 2+ HF, 3+ KE and 4+ ankle. No sensory findings. CN exam normal. DTR's 1+. Cognitively appropriate Musc: right hip/groin tender to PROM and AROM.  Skin: Skin is warm and dry.  Psychiatric: His behavior is normal. Thought content normal. His mood appears anxious.    Assessment/Plan: 1. Functional deficits secondary to metastatic prostate cancer to the pelvis which  require 3+ hours per day of interdisciplinary therapy in a comprehensive inpatient rehab setting. Physiatrist is providing close team supervision and 24 hour management of active medical problems listed below. Physiatrist and rehab team continue to assess barriers to discharge/monitor patient progress toward functional and medical goals. FIM: FIM - Bathing Bathing Steps Patient Completed: Chest;Right Arm;Left Arm;Abdomen Bathing: 2: Max-Patient completes 3-4 63f 10 parts or 25-49% (patient c/o nausea and fatigue greater today than yesterday)  FIM - Upper Body Dressing/Undressing Upper body dressing/undressing: 0: Wears gown/pajamas-no public clothing FIM - Lower Body Dressing/Undressing Lower body dressing/undressing: 0: Wears gown/pajamas-no public clothing  FIM - Toileting Toileting steps completed by patient: Adjust clothing prior to toileting;Performs perineal hygiene;Adjust clothing after toileting Toileting: 0: Activity did not occur  FIM - Radio producer Devices: Grab bars;Bedside commode Toilet Transfers: 2-To toilet/BSC: Max A (lift and lower assist);2-From toilet/BSC: Max A (lift and lower assist)  FIM - Engineer, site Assistive Devices: Bed rails;Arm rests Bed/Chair Transfer: 4: Sit > Supine: Min A (steadying pt. > 75%/lift 1 leg);3: Chair or W/C > Bed: Mod A (lift or lower assist)  FIM - Locomotion: Wheelchair Distance: Pt initially required total A for w/c to gym x 150'; when cued to begin w/c propulsion with UE back to room pt only able to perform 10' before reporting fatigue; returned to room with total A Locomotion: Wheelchair: 1: Total Assistance/staff pushes wheelchair (Pt<25%) FIM - Locomotion: Ambulation Locomotion: Ambulation Assistive Devices: Administrator Ambulation/Gait Assistance: 3:  Mod assist Locomotion: Ambulation: 1: Travels less than 50 ft with moderate assistance (Pt: 50 -  74%)  Comprehension Comprehension Mode: Auditory Comprehension: 5-Follows basic conversation/direction: With extra time/assistive device  Expression Expression Mode: Verbal Expression: 5-Expresses basic needs/ideas: With extra time/assistive device  Social Interaction Social Interaction: 6-Interacts appropriately with others with medication or extra time (anti-anxiety, antidepressant).  Problem Solving Problem Solving: 5-Solves basic 90% of the time/requires cueing < 10% of the time  Memory Memory: 6-More than reasonable amt of time  Medical Problem List and Plan:  Metastatic prostate cancer to the pelvis  1. DVT Prophylaxis/Anticoagulation: Pharmaceutical: Coumadin  2. Right hip pain/Pain Management: improved  -continue po dilaudid  -increase ms contin to 30mg  bid -appropriate pacing/rest breaks etc  -increased lidoderm patches to 3 daily. 3. Mood: LCSW to follow for evaluation and support.  4. Neuropsych: This patient is capable of making decisions on his own behalf.  5. A Fib/TAVR 01/2014: Continue coumadin  6. H/O CVA: continue ASA for secondary stroke prevention.  7. NICM/Chronic combined systolic and diastolic CHF: Off diuretics and No ace due to low BP. Low salt diet. Monitor daily weights. ---not being done daily 8. Constipation: Continue miralax bid and Senna S. dulc suppository and SSE prn  9. Chronic fatigue with anorexia: added supplements to help with energy levels and nutritional status.   LOS (Days) 4 A FACE TO FACE EVALUATION WAS PERFORMED  Meredith Staggers 03/31/2014 8:05 AM

## 2014-03-31 NOTE — Progress Notes (Signed)
Occupational Therapy Session Note  Patient Details  Name: Lucas Green MRN: [id number] Date of Birth: 1932/03/05  Today's Date: 03/31/2014 Time: 9833-8250 Time Calculation (min): 43 min  Short Term Goals: Week 1:  OT Short Term Goal 1 (Week 1): Patient will complete transfer in/out of walk-in shower with mod assist OT Short Term Goal 2 (Week 1): Patient will complete lower body bathing sitting and standing using AE, prn, with mod assist OT Short Term Goal 3 (Week 1): Patient will complete upper body bathing and dressing seated at sink with min assist OT Short Term Goal 4 (Week 1): Patient will perform 50% of clothing management task for toileting standing at RW with steadying assist  Skilled Therapeutic Interventions/Progress Updates:    Patient seen this pm to address functional mobility and activity tolerance.  Patient reporting recent dizziness from trying on his wife's glasses.  Patient reporting dull occipital headache, and low level nausea.  Patient agreeable to actual toilet transfer to attempt to empty bowels.  Transferred with use of grab bars for successful BM.  Patient unable to stand and clean self afterwards, but able to stand long enough to be cleaned and brief pulled up (20 seconds)    Therapy Documentation Pain:  Reported pain of 5/10 with recent stand prior to OT's arrival ADL: ADL ADL Comments: see FIM  See FIM for current functional status  Therapy/Group: Individual Therapy  Mariah Milling 03/31/2014, 1:45 PM

## 2014-03-31 NOTE — Progress Notes (Signed)
Physical Therapy Note  Patient Details  Name: Lucas Green MRN: [id number] Date of Birth: 08/22/1932 Today's Date: 03/31/2014 7544-9201, 45 min individual therapy; missed 15 min due to nausea, fatigue; RN notified Pain pelvis rated 3/10, premedicated  Pt agreeable to bedside tx; felt too nauseated to get OOB.  Tx started with HOB slightly raised, but that position increased pt's nausea, so HOB raised to sitting position.   Therapeutic exercise performed with LEs to increase strength for functional mobility: R and L straight leg raises, heel slides, short arc quad knee ext, hip abd/add, 2 x 10 each; ankle pumps 5 x 10 each.    Each set of reps followed by brief rest.  Discussed trial of bed> recliner SBT, but pt unable to focus on this.  Discussed with wife; she is familiar with SB due to a friend's experience.  Pt stated that his head felt "woozy" and that feeling limited him more than pain this tx.   Frederic Jericho 03/31/2014, 7:53 AM

## 2014-03-31 NOTE — Progress Notes (Signed)
Occupational Therapy Cancellation Note  Patient Details  Name: Lucas Green MRN: [id number] Date of Birth: 1932-01-29  Today's Date: 03/31/2014  Patient declined 60 min of scheduled OT this am secondary to still eating breakfast, requesting suppository and BM prior to self care session with therapy.  This OT offered to assist with transfer to [facility] to empty bowels and patient declined stating that he would be using a bedpan.  Patient also stated that he wears a gown instead of clothes.  Patient also reports that he has nausea secondary to pain medication.  Gaye Pollack 03/31/2014, 8:10 AM

## 2014-03-31 NOTE — Progress Notes (Signed)
Coumadin per pharmacy  - 81 yoM with metastatic prostate cancer admitted 4/11 with right hip pain. Patient on warfarin PTA for AFib. Patient transferred to [facility] on 4/14 for XRT treatments. Pharmacy has been consulted to continue warfarin while inpatient. INR therapeutic at 2.94 - remains at upper end of therapeutic range. Appears pt may need lower than previous home dose of 6mg  daily. No bleeding noted.   GOAL: INR 2-3  PLAN:  1. Coumadin 2mg  po today (Patient may need 3mg  most days and 2mg  few times per week) 2. Daily INR. Monitor for bleeding complications.  3. Coumadin educated on 03/28/14

## 2014-04-01 ENCOUNTER — Encounter (HOSPITAL_COMMUNITY): Payer: Medicare Other

## 2014-04-01 ENCOUNTER — Inpatient Hospital Stay (HOSPITAL_COMMUNITY): Payer: Medicare Other

## 2014-04-01 ENCOUNTER — Inpatient Hospital Stay (HOSPITAL_COMMUNITY): Payer: Medicare Other | Admitting: Occupational Therapy

## 2014-04-01 ENCOUNTER — Inpatient Hospital Stay (HOSPITAL_COMMUNITY): Payer: Medicare Other | Admitting: *Deleted

## 2014-04-01 DIAGNOSIS — N2881 Hypertrophy of kidney: Secondary | ICD-10-CM

## 2014-04-01 DIAGNOSIS — C7952 Secondary malignant neoplasm of bone marrow: Secondary | ICD-10-CM

## 2014-04-01 DIAGNOSIS — C61 Malignant neoplasm of prostate: Secondary | ICD-10-CM

## 2014-04-01 DIAGNOSIS — C7951 Secondary malignant neoplasm of bone: Secondary | ICD-10-CM

## 2014-04-01 DIAGNOSIS — J189 Pneumonia, unspecified organism: Secondary | ICD-10-CM

## 2014-04-01 LAB — PROTIME-INR
INR: 2.83 — AB (ref 0.00–1.49)
Prothrombin Time: 28.8 seconds — ABNORMAL HIGH (ref 11.6–15.2)

## 2014-04-01 MED ORDER — WARFARIN SODIUM 3 MG PO TABS
3.0000 mg | ORAL_TABLET | Freq: Once | ORAL | Status: AC
  Administered 2014-04-01: 3 mg via ORAL
  Filled 2014-04-01: qty 1

## 2014-04-01 NOTE — Progress Notes (Signed)
Physical Therapy Session Note  Patient Details  Name: Lucas Green MRN: [id number] Date of Birth: 06/14/1932  Today's Date: 04/01/2014 Time: 9379-0240 Time Calculation (min): 10 min  Short Term Goals: Week 1:  PT Short Term Goal 1 (Week 1): Pt will perform supine <> sit on flat bed mod A reporting pain <5/10 PT Short Term Goal 2 (Week 1): Pt will perform transfers bed <> w/c mod A with RW and reporting pain <5/10 PT Short Term Goal 3 (Week 1): Pt will perform w/c mobility x 50' with bilat UE and supervision for endurance PT Short Term Goal 4 (Week 1): Pt will perform gait x 15' with RW and mod A with pain < 5/10 PT Short Term Goal 5 (Week 1): Pt will perform up/down one step with RW and mod A with <5/10 pain  Skilled Therapeutic Interventions/Progress Updates:   Attempted to engage pt in therapeutic activities to increase activity tolerance and functional mobility, but pt declined attempts for OOB or supine LE therex. Pt reports he is too worn out from therapies this morning. Discussed possible option to reduce schedule to 15/7 which he states was discussed with MD this morning (therapist said she would mention this in conference as well) and thinks this may be helpful. Wife present also and educated pt and wife on importance of mobility despite fatigue to increase overall endurance and strength. Pt missed 20 min of skilled PT intervention. RN made aware.  Therapy Documentation Precautions:  Precautions Precautions: Fall Precaution Comments: Notify physician for pulse less than 55 or greater than 120, respiratory rate less than 12 or greater than 25, temperature greater than 100.5 F, urinary output less than 30 mL/hr for four hours, systolic BP less than 90 or greater than 973, diastolic BP less than 60 or greater than 100.  Prostate CA with mets to pelvis; pain; AFIB and CHF; monitor vitals Restrictions Weight Bearing Restrictions: No Other Position/Activity Restrictions: PA states no  activity restrictions; exercise to pt pain tolerance; may want to avoid resisted exercises or use of Nustep secondary to pain (pt reports use of bike and Nustep at cardiac rehab were very painful) General: Amount of Missed PT Time (min): 20 Minutes Pain: Denies pain currently, but c/o significant fatigue and reports nausea (pt holding basin throughout time therapist in room).  See FIM for current functional status  Therapy/Group: Individual Therapy  Lars Masson 04/01/2014, 1:20 PM

## 2014-04-01 NOTE — Progress Notes (Signed)
Physical Therapy Session Note  Patient Details  Name: Lucas Green MRN: [id number] Date of Birth: June 11, 1932  Today's Date: 04/01/2014 Time: 4827-0786 Time Calculation (min): 25 min - Pt missed 76min PT due to fatigue  Short Term Goals: Week 1:  PT Short Term Goal 1 (Week 1): Pt will perform supine <> sit on flat bed mod A reporting pain <5/10 PT Short Term Goal 2 (Week 1): Pt will perform transfers bed <> w/c mod A with RW and reporting pain <5/10 PT Short Term Goal 3 (Week 1): Pt will perform w/c mobility x 50' with bilat UE and supervision for endurance PT Short Term Goal 4 (Week 1): Pt will perform gait x 15' with RW and mod A with pain < 5/10 PT Short Term Goal 5 (Week 1): Pt will perform up/down one step with RW and mod A with <5/10 pain  Skilled Therapeutic Interventions/Progress Updates:  Pt resting in bed, needing diaper changed, but not interested in OOB activity due to fatigue.  Pt attempted doffing pants, but needing assist to complete the pull down and unthread legs due to unable to fully bridge.  Performed multiple bridging reps for LE strengthening and increased assist with functional mobility.  Pt engage in bed mobility including rolling R/L multiple times for changing diaper and cleaning.  Pt assisted in scooting HOB with bil LEs pushing, but unable to reach UEs  Encouraged pt to increase OOB activity and/oor supine therex but he refused due to fatigue. Pt educated on importance of OOB activity despite cancer-related fatigue. Pt acknowledged, but not interested at this time. Pt left with call bell, bed alarm, and all needs in reach.      Therapy Documentation Precautions:  Precautions Precautions: Fall Precaution Comments: Notify physician for pulse less than 55 or greater than 120, respiratory rate less than 12 or greater than 25, temperature greater than 100.5 F, urinary output less than 30 mL/hr for four hours, systolic BP less than 90 or greater than 754,  diastolic BP less than 60 or greater than 100.  Prostate CA with mets to pelvis; pain; AFIB and CHF; monitor vitals Restrictions Weight Bearing Restrictions: No Other Position/Activity Restrictions: PA states no activity restrictions; exercise to pt pain tolerance; may want to avoid resisted exercises or use of Nustep secondary to pain (pt reports use of bike and Nustep at cardiac rehab were very painful) Vital Signs: Therapy Vitals Temp: 97.3 F (36.3 C) Temp src: Oral Pulse Rate: 84 Resp: 18 BP: 124/73 mmHg Patient Position, if appropriate: Lying Oxygen Therapy SpO2: 95 % O2 Device: None (Room air) Pain: none  See FIM for current functional status  Therapy/Group: Individual Therapy Kennieth Rad, PT, DPT  04/01/2014, 3:53 PM

## 2014-04-01 NOTE — Progress Notes (Signed)
ANTICOAGULATION CONSULT NOTE - Follow Up Consult  Pharmacy Consult for Coumadin Indication: atrial fibrillation; recent TVAR (01/2014) due to severe AVS  Allergies  Allergen Reactions  . Other     Beer and Wine- swelling/headache/nausea    Patient Measurements: Height: 6' 2.02" (188 cm) Weight: 193 lb 5.5 oz (87.7 kg) IBW/kg (Calculated) : 82.24  Vital Signs: Temp: 97 F (36.1 C) (04/21 0558) Temp src: Oral (04/21 0558) BP: 155/74 mmHg (04/21 0558) Pulse Rate: 84 (04/21 0558)  Labs:  Recent Labs  03/30/14 0630 03/31/14 0530 04/01/14 0707  LABPROT 29.0* 29.6* 28.8*  INR 2.86* 2.94* 2.83*    Estimated Creatinine Clearance: 79.1 ml/min (by C-G formula based on Cr of 0.88).  Assessment: 80 y.o. Male with metastatic prostate cancer on coumadin PTA for afib. Pharmacy consulted to continue warfarin while inpatient.  INR therapeutic at 2.83 today.  No bleeding noted.  PTA coumadin dose was 6mg  daily.   Goal of Therapy:  INR 2-3 Monitor platelets by anticoagulation protocol: Yes   Plan:  1. Coumadin 3mg  po today x1  (Patient may need 3mg  most days and 2mg  few times per week) 2. Daily INR. Monitor for bleeding complications.  3. Coumadin educated on 03/28/14   Nicole Cella, [place] Clinical Pharmacist Pager: [phone] 04/01/2014,10:47 AM

## 2014-04-01 NOTE — Progress Notes (Signed)
Lucas Green PHYSICAL MEDICINE & REHABILITATION     PROGRESS NOTE    Subjective/Complaints: Pain better yesterday. Tolerated activities better A 12 point review of systems has been performed and if not noted above is otherwise negative.   Objective: Vital Signs: Blood pressure 155/74, pulse 84, temperature 97 F (36.1 C), temperature source Oral, resp. rate 18, height 6' 2.02" (1.88 m), weight 87.7 kg (193 lb 5.5 oz), SpO2 95.00%. No results found. No results found for this basename: WBC, HGB, HCT, PLT,  in the last 72 hours No results found for this basename: NA, K, CL, CO, GLUCOSE, BUN, CREATININE, CALCIUM,  in the last 72 hours CBG (last 3)  No results found for this basename: GLUCAP,  in the last 72 hours  Wt Readings from Last 3 Encounters:  03/30/14 87.7 kg (193 lb 5.5 oz)  03/24/14 87.5 kg (192 lb 14.4 oz)  03/06/14 87.091 kg (192 lb)    Physical Exam:  Nursing note and vitals reviewed.  Constitutional: Lucas Green is oriented to person, place, and time. Lucas Green appears well-developed and well-nourished.  HENT:  Head: Normocephalic and atraumatic.  Eyes: Conjunctivae are normal. Pupils are equal, round, and reactive to light.  Neck: Normal range of motion. Neck supple.  Cardiovascular: Normal rate and regular rhythm. No murmurs, rubs, or gallops  No murmur heard.  Respiratory: Effort normal and breath sounds normal. No respiratory distress. Lucas Green has no wheezes.  GI: Soft. Bowel sounds are normal.  Neurological: Lucas Green is alert and oriented to person, place, and time. UES nearly 5/5. RLE 1+ HF, 2 KE and 4/5 ankle. LLE 2+ HF, 3+ KE and 4+ ankle. No sensory findings. CN exam normal. DTR's 1+. Cognitively appropriate Musc: right hip/groin tender to PROM and AROM.  Skin: Skin is warm and dry.  Psychiatric: His behavior is normal. Thought content normal. His mood appears anxious.    Assessment/Plan: 1. Functional deficits secondary to metastatic prostate cancer to the pelvis which require 3+  hours per day of interdisciplinary therapy in a comprehensive inpatient rehab setting. Physiatrist is providing close team supervision and 24 hour management of active medical problems listed below. Physiatrist and rehab team continue to assess barriers to discharge/monitor patient progress toward functional and medical goals. FIM: FIM - Bathing Bathing Steps Patient Completed: Chest;Right Arm;Left Arm;Abdomen Bathing: 2: Max-Patient completes 3-4 20f 10 parts or 25-49% (patient c/o nausea and fatigue greater today than yesterday)  FIM - Upper Body Dressing/Undressing Upper body dressing/undressing: 0: Wears gown/pajamas-no public clothing FIM - Lower Body Dressing/Undressing Lower body dressing/undressing: 0: Wears gown/pajamas-no public clothing  FIM - Toileting Toileting steps completed by patient: Adjust clothing prior to toileting;Performs perineal hygiene;Adjust clothing after toileting Toileting: 1: Total-Patient completed zero steps, helper did all 3  FIM - Radio producer Devices: Bedside commode;Grab bars Toilet Transfers: 3-To toilet/BSC: Mod A (lift or lower assist);4-From toilet/BSC: Min A (steadying Pt. > 75%)  FIM - Bed/Chair Transfer Bed/Chair Transfer Assistive Devices: Bed rails;Arm rests Bed/Chair Transfer: 5: Sit > Supine: Supervision (verbal cues/safety issues);3: Chair or W/C > Bed: Mod A (lift or lower assist)  FIM - Locomotion: Wheelchair Distance: Pt initially required total A for w/c to gym x 150'; when cued to begin w/c propulsion with UE back to room pt only able to perform 10' before reporting fatigue; returned to room with total A Locomotion: Wheelchair: 1: Total Assistance/staff pushes wheelchair (Pt<25%) FIM - Locomotion: Ambulation Locomotion: Ambulation Assistive Devices: Administrator Ambulation/Gait Assistance: 3: Mod assist Locomotion:  Ambulation: 1: Travels less than 50 ft with moderate assistance (Pt: 50 -  74%)  Comprehension Comprehension Mode: Auditory Comprehension: 5-Follows basic conversation/direction: With no assist  Expression Expression Mode: Verbal Expression: 5-Expresses basic needs/ideas: With extra time/assistive device  Social Interaction Social Interaction: 6-Interacts appropriately with others with medication or extra time (anti-anxiety, antidepressant).  Problem Solving Problem Solving: 5-Solves basic 90% of the time/requires cueing < 10% of the time  Memory Memory: 6-More than reasonable amt of time  Medical Problem List and Plan:  Metastatic prostate cancer to the pelvis  1. DVT Prophylaxis/Anticoagulation: Pharmaceutical: Coumadin  2. Right hip pain/Pain Management: current adjustments/regimen proved effective so far  -continue po dilaudid  -increased ms contin to 30mg  bid -appropriate pacing/rest breaks etc  -increased lidoderm patches to 3 daily. 3. Mood: LCSW to follow for evaluation and support.  4. Neuropsych: This patient is capable of making decisions on his own behalf.  5. A Fib/TAVR 01/2014: Continue coumadin  6. H/O CVA: continue ASA for secondary stroke prevention.  7. NICM/Chronic combined systolic and diastolic CHF: Off diuretics and No ace due to low BP. Low salt diet. Monitor daily weights. ---not being done daily 8. Constipation: Continue miralax bid and Senna S. dulc suppository and SSE prn---had bm yesterday  9. Chronic fatigue with anorexia: added supplements to help with energy levels and nutritional status.   LOS (Days) 5 A FACE TO FACE EVALUATION WAS PERFORMED  Meredith Staggers 04/01/2014 7:42 AM

## 2014-04-01 NOTE — Progress Notes (Signed)
Occupational Therapy Session Note  Patient Details  Name: Lucas Green MRN: [id number] Date of Birth: 1932/05/27  Today's Date: 04/01/2014 Time: 0930-1030 Time Calculation (min):60 min  Short Term Goals: Week 1:  OT Short Term Goal 1 (Week 1): Patient will complete transfer in/out of walk-in shower with mod assist OT Short Term Goal 2 (Week 1): Patient will complete lower body bathing sitting and standing using AE, prn, with mod assist OT Short Term Goal 3 (Week 1): Patient will complete upper body bathing and dressing seated at sink with min assist OT Short Term Goal 4 (Week 1): Patient will perform 50% of clothing management task for toileting standing at RW with steadying assist  Skilled Therapeutic Interventions/Progress Updates: ADL-retraining with focus on functional transfers (bed>w/c, w/c><toilet), endurance, sit><stand, dynamic standing balance, toileting, and energy conservation.   Patient received supine in bed with wife in room attending to his needs.   Patient reported no pain but mild nausea, not relieved by various home supplies of ginger candies and ginger ale.  Patient requested assist with toilet transfer and toileting but initially declined planned ADL.   With extra time, patient completed bed mobility to rise to sitting at edge of bed.  With Max assist (pt=30%), patient completed SPT to w/c.   HR and O2 sats were assessed within limits (86 bpm, 93%) but patient was fatigued by transfer and required rest break of 3 min before attempting toilet transfer.   Patient's SPT improved at toilet (pt=70%, Mod A to lift) and patient passed loose BM and with steadying assist performed hygiene (60% thorough) while standing with w/c placed to allow use of handles for support.  Patient returned to sitting at [facility] over toilet and was encouraged to bathe and dress while on [facility].   Patient completed ADL tasks with extra time and setup, requiring assist only for management of lower legs/feet during  bathing and dressing.    Patient was exhausted by end of session but remained in w/c with wife assisting with setup at sink for grooming.    Therapy Documentation Precautions:  Precautions Precautions: Fall Precaution Comments: Notify physician for pulse less than 55 or greater than 120, respiratory rate less than 12 or greater than 25, temperature greater than 100.5 F, urinary output less than 30 mL/hr for four hours, systolic BP less than 90 or greater than 683, diastolic BP less than 60 or greater than 100.  Prostate CA with mets to pelvis; pain; AFIB and CHF; monitor vitals Restrictions Weight Bearing Restrictions: No Other Position/Activity Restrictions: PA states no activity restrictions; exercise to pt pain tolerance; may want to avoid resisted exercises or use of Nustep secondary to pain (pt reports use of bike and Nustep at cardiac rehab were very painful) General: General Amount of Missed OT Time (min): 30 Minutes  Pain: Pain Assessment Pain Assessment: Faces Pain Score: 5  Faces Pain Scale: No hurt Pain Type: Acute pain Pain Location: Hip Pain Orientation: Right;Upper;Posterior;Lateral Pain Frequency: Intermittent Pain Onset: With Activity Pain Intervention(s): Medication (See eMAR);RN made aware;Distraction;Repositioned  ADL: ADL ADL Comments: see FIM  See FIM for current functional status  Therapy/Group: Individual Therapy  Salome Spotted 04/01/2014, 12:06 PM

## 2014-04-01 NOTE — Consult Note (Signed)
  INITIAL DIAGNOSTIC EVALUATION - CONFIDENTIAL Lucas Green   MEDICAL NECESSITY:  Lucas Green was seen on the [facility] for an initial diagnostic evaluation owing to the patient's diagnosis of metastatic prostate cancer.   According to medical records, Lucas Green was admitted to the rehab unit owing to "Functional deficits secondary to metastatic prostate cancer to the pelvis." He has a history of prostate cancer, CHF, and prior CVA. He presented to the ED on 03/21/14 with reports of 2-3 week history of progressive right hip pain with inability to stand. X-rays of right hip revealed progressive destructive bone changes of the right anterior iliac spine with pathologic fractures involving the right superior and inferior pubic rami. He reportedly has poor activity tolerance due to severe pain.   During today's visit, Lucas Green denied experiencing any major cognitive difficulties with the exception of mild "fogginess" that he feels is medication induced. He denied suffering from any depression or anxiety. His chief complaint was pain that is somewhat better controlled. He has had multiple hospital admissions over the past year and he was reportedly getting discouraged. He now sees his situation has turned toward the positive and he can't wait to get home.   Lucas Green reported having one issue with a nurse but this situation has been resolved. He has a great social support system and his wife visits daily. He believes that he is making strides in therapy but his pain is a significant barrier.   PROCEDURES ADMINISTERED: [1 unit [id number] on 03/31/14] Diagnostic clinical interview  Review of available records  Emotional & Behavioral Evaluation: Lucas Green was appropriately dressed for season and situation, and he appeared tidy and well-groomed. Normal posture was noted. He was friendly and rapport was easily established. His speech  was as expected and he was able to express ideas effectively. He seemed to understand test directions readily. His affect was appropriately modulated. Attention and motivation were good. Optimal test taking conditions were maintained.  From an emotional standpoint, Lucas Green denied suffering from any major signs of clinical psychopathology. No adjustment issues were reported. Suicidal/homicidal ideation, plan or intent was denied. No manic or hypomanic episodes were reported. The patient denied ever experiencing any auditory/visual hallucinations. No major behavioral or personality changes were endorsed.    Overall, Lucas Green endorsed suffering from a great deal of pain that is adversely impacting his ability to participate in therapy consistently. He denied suffering from any major cognitive changes even in light of his medical situation and past CVA. He is adjusting well to his hospital stay.  A majority of the time was spent providing supportive psychotherapy.    RECOMMENDATIONS  Recommendations for treatment team:    The patient requested that he be able to perform PT upon discharge from the rehab unit as an inpatient.   He was encouraged to contact neuropsychology if his mood or cognition changes. No scheduled follow-up required.     Rutha Bouchard, Psy.D.  Clinical Neuropsychologist

## 2014-04-01 NOTE — Progress Notes (Signed)
Occupational Therapy Session Note  Patient Details  Name: Lucas Green MRN: [id number] Date of Birth: 10-Oct-1932  Today's Date: 04/01/2014 Time: 1101-1130 Time Calculation (min): 29 min  Skilled Therapeutic Interventions/Progress Updates:    Pt fatigued and in bed from earlier B/D session with OT.  Also reporting nausea as well which per his report, he had already been given Zofran.  Pt too fatigued to get OOB per his request but did agree to attempt UE exercises at bed level.  Therapist helped position pt further up in bed as he had slid down initially.  He was able to perform 2 sets of 10 repetitions of bilateral elbow flexion using 2 lb dumbells.  He needed 2-3 minute rest breaks between sets.  Completed 2 sets of 10 repetitions for shoulder flexion to 90 degrees.  Pt with increased nausea as well and fatigue.  Stated he could not continue session after these 2 exercises.  Nursing notified of pt's condition, O2 sats 95% on room air and HR 81 BPM.  Pt given call button, washcloth, and emesis basin.  Pt did report that he was not having very much pain today but was an improvement.    Therapy Documentation Precautions:  Precautions Precautions: Fall Precaution Comments: Notify physician for pulse less than 55 or greater than 120, respiratory rate less than 12 or greater than 25, temperature greater than 100.5 F, urinary output less than 30 mL/hr for four hours, systolic BP less than 90 or greater than 784, diastolic BP less than 60 or greater than 100.  Prostate CA with mets to pelvis; pain; AFIB and CHF; monitor vitals Restrictions Weight Bearing Restrictions: No Other Position/Activity Restrictions: PA states no activity restrictions; exercise to pt pain tolerance; may want to avoid resisted exercises or use of Nustep secondary to pain (pt reports use of bike and Nustep at cardiac rehab were very painful) General: General Amount of Missed OT Time (min): 30 Minutes  Pain: Pain  Assessment Pain Assessment: Faces Pain Score: 5  Faces Pain Scale: No hurt Pain Type: Acute pain Pain Location: Hip Pain Orientation: Right;Upper;Posterior;Lateral Pain Frequency: Intermittent Pain Onset: With Activity Pain Intervention(s): Medication (See eMAR);RN made aware;Distraction;Repositioned ADL: ADL ADL Comments: see FIM  See FIM for current functional status  Therapy/Group: Individual Therapy  Cindra Presume OTR/L 04/01/2014, 12:01 PM

## 2014-04-02 ENCOUNTER — Inpatient Hospital Stay (HOSPITAL_COMMUNITY): Payer: Medicare Other | Admitting: Physical Therapy

## 2014-04-02 ENCOUNTER — Inpatient Hospital Stay (HOSPITAL_COMMUNITY): Payer: Medicare Other | Admitting: Occupational Therapy

## 2014-04-02 ENCOUNTER — Inpatient Hospital Stay (HOSPITAL_COMMUNITY): Payer: Medicare Other

## 2014-04-02 DIAGNOSIS — J189 Pneumonia, unspecified organism: Secondary | ICD-10-CM

## 2014-04-02 DIAGNOSIS — C7951 Secondary malignant neoplasm of bone: Secondary | ICD-10-CM

## 2014-04-02 DIAGNOSIS — C61 Malignant neoplasm of prostate: Secondary | ICD-10-CM

## 2014-04-02 DIAGNOSIS — C7952 Secondary malignant neoplasm of bone marrow: Secondary | ICD-10-CM

## 2014-04-02 LAB — PROTIME-INR
INR: 2.74 — ABNORMAL HIGH (ref 0.00–1.49)
Prothrombin Time: 28.1 seconds — ABNORMAL HIGH (ref 11.6–15.2)

## 2014-04-02 MED ORDER — WARFARIN SODIUM 3 MG PO TABS
3.0000 mg | ORAL_TABLET | Freq: Once | ORAL | Status: AC
  Administered 2014-04-02: 3 mg via ORAL
  Filled 2014-04-02: qty 1

## 2014-04-02 NOTE — Progress Notes (Signed)
[facility] PHYSICAL MEDICINE & REHABILITATION     PROGRESS NOTE    Subjective/Complaints: Pain better as a whole. Fatigues easily in therapies however A 12 point review of systems has been performed and if not noted above is otherwise negative.   Objective: Vital Signs: Blood pressure 126/69, pulse 80, temperature 97.9 F (36.6 C), temperature source Oral, resp. rate 18, height 6' 2.02" (1.88 m), weight 84.4 kg (186 lb 1.1 oz), SpO2 98.00%. No results found. No results found for this basename: WBC, HGB, HCT, PLT,  in the last 72 hours No results found for this basename: NA, K, CL, CO, GLUCOSE, BUN, CREATININE, CALCIUM,  in the last 72 hours CBG (last 3)  No results found for this basename: GLUCAP,  in the last 72 hours  Wt Readings from Last 3 Encounters:  04/02/14 84.4 kg (186 lb 1.1 oz)  03/24/14 87.5 kg (192 lb 14.4 oz)  03/06/14 87.091 kg (192 lb)    Physical Exam:  Nursing note and vitals reviewed.  Constitutional: He is oriented to person, place, and time. He appears well-developed and well-nourished.  HENT:  Head: Normocephalic and atraumatic.  Eyes: Conjunctivae are normal. Pupils are equal, round, and reactive to light.  Neck: Normal range of motion. Neck supple.  Cardiovascular: Normal rate and regular rhythm. No murmurs, rubs, or gallops  No murmur heard.  Respiratory: Effort normal and breath sounds normal. No respiratory distress. He has no wheezes.  GI: Soft. Bowel sounds are normal.  Neurological: He is alert and oriented to person, place, and time. UES nearly 5/5. RLE 1+ HF, 2 KE and 4/5 ankle. LLE 2+ HF, 3+ KE and 4+ ankle. No sensory findings. CN exam normal. DTR's 1+. Cognitively appropriate Musc: right hip/groin tender to PROM and AROM.  Skin: Skin is warm and dry.  Psychiatric: His behavior is normal. Thought content normal. His mood appears anxious.    Assessment/Plan: 1. Functional deficits secondary to metastatic prostate cancer to the pelvis which  require 3+ hours per day of interdisciplinary therapy in a comprehensive inpatient rehab setting. Physiatrist is providing close team supervision and 24 hour management of active medical problems listed below. Physiatrist and rehab team continue to assess barriers to discharge/monitor patient progress toward functional and medical goals.  Probably will need placement. SW to d/w wife FIM: FIM - Bathing Bathing Steps Patient Completed: Chest;Right Arm;Left Arm;Abdomen;Front perineal area;Buttocks Bathing: 3: Mod-Patient completes 5-7 27f 10 parts or 50-74%  FIM - Upper Body Dressing/Undressing Upper body dressing/undressing steps patient completed: Thread/unthread right sleeve of pullover shirt/dresss;Thread/unthread left sleeve of pullover shirt/dress;Put head through opening of pull over shirt/dress;Pull shirt over trunk Upper body dressing/undressing: 5: Set-up assist to: Obtain clothing/put away FIM - Lower Body Dressing/Undressing Lower body dressing/undressing steps patient completed: Fasten/unfasten pants Lower body dressing/undressing: 1: Total-Patient completed less than 25% of tasks  FIM - Toileting Toileting steps completed by patient: Performs perineal hygiene Toileting Assistive Devices: Grab bar or rail for support Toileting: 2: Max-Patient completed 1 of 3 steps  FIM - Radio producer Devices: Bedside commode;Grab bars Toilet Transfers: 3-To toilet/BSC: Mod A (lift or lower assist);3-From toilet/BSC: Mod A (lift or lower assist)  FIM - Bed/Chair Transfer Bed/Chair Transfer Assistive Devices: HOB elevated;Bed rails;Arm rests Bed/Chair Transfer: 0: Activity did not occur  FIM - Locomotion: Wheelchair Distance: Pt initially required total A for w/c to gym x 150'; when cued to begin w/c propulsion with UE back to room pt only able to perform 10' before  reporting fatigue; returned to room with total A Locomotion: Wheelchair: 0: Activity did not  occur FIM - Locomotion: Ambulation Locomotion: Ambulation Assistive Devices: Administrator Ambulation/Gait Assistance: 3: Mod assist Locomotion: Ambulation: 0: Activity did not occur  Comprehension Comprehension Mode: Auditory Comprehension: 5-Follows basic conversation/direction: With extra time/assistive device  Expression Expression Mode: Verbal Expression: 5-Expresses basic needs/ideas: With no assist  Social Interaction Social Interaction: 6-Interacts appropriately with others with medication or extra time (anti-anxiety, antidepressant).  Problem Solving Problem Solving: 5-Solves basic 90% of the time/requires cueing < 10% of the time  Memory Memory: 6-More than reasonable amt of time  Medical Problem List and Plan:  Metastatic prostate cancer to the pelvis  1. DVT Prophylaxis/Anticoagulation: Pharmaceutical: Coumadin  2. Right hip pain/Pain Management: current adjustments/regimen proved effective so far  -continue po dilaudid  -increased ms contin to 30mg  bid with good results -appropriate pacing/rest breaks etc  -increased lidoderm patches to 3 daily. 3. Mood: LCSW to follow for evaluation and support.  4. Neuropsych: This patient is capable of making decisions on his own behalf.  5. A Fib/TAVR 01/2014: Continue coumadin  6. H/O CVA: continue ASA for secondary stroke prevention.  7. NICM/Chronic combined systolic and diastolic CHF: Off diuretics and No ace due to low BP. Low salt diet. Monitor daily weights. 8. Constipation: Continue miralax bid and Senna S. dulc suppository and SSE prn---had bm yesterday  9. Chronic fatigue with anorexia: added supplements to help with energy levels and nutritional status.   LOS (Days) 6 A FACE TO FACE EVALUATION WAS PERFORMED  Meredith Staggers 04/02/2014 7:56 AM

## 2014-04-02 NOTE — Progress Notes (Signed)
ANTICOAGULATION CONSULT NOTE - Follow Up Consult  Pharmacy Consult for Coumadin Indication: atrial fibrillation; recent TVAR (01/2014) due to severe AVS  Allergies  Allergen Reactions  . Other     Beer and Wine- swelling/headache/nausea    Patient Measurements: Height: 6' 2.02" (188 cm) Weight: 186 lb 1.1 oz (84.4 kg) IBW/kg (Calculated) : 82.24  Vital Signs: Temp: 97.9 F (36.6 C) (04/22 0646) Temp src: Oral (04/22 0646) BP: 139/68 mmHg (04/22 1017) Pulse Rate: 85 (04/22 1017)  Labs:  Recent Labs  03/31/14 0530 04/01/14 0707 04/02/14 0608  LABPROT 29.6* 28.8* 28.1*  INR 2.94* 2.83* 2.74*    Estimated Creatinine Clearance: 79.1 ml/min (by C-G formula based on Cr of 0.88).  Assessment: 80 y.o. Male with metastatic prostate cancer on coumadin PTA for afib.   INR remains therapeutic at 2.74 today.  No bleeding noted. INR has remained at upper end of therapeutic range. Appears pt may need lower than previous home dose of 6mg  daily.    Goal of Therapy:  INR 2-3 Monitor platelets by anticoagulation protocol: Yes   Plan:  1. Coumadin 3mg  po today x1  (Patient may need 3mg  daily or 3mg  most days and 2mg  few times per week) 2. Daily INR. Monitor for bleeding complications.  3. Coumadin educated on 03/28/14   Nicole Cella, RPh Clinical Pharmacist Pager: [phone] 04/02/2014,11:40 AM

## 2014-04-02 NOTE — Patient Care Conference (Signed)
Inpatient RehabilitationTeam Conference and Plan of Care Update Date: 04/02/2014   Time: 11:34 AM    Patient Name: Lucas Green      Medical Record Number: [id number]  Date of Birth: [birth year] Sex: Male         Room/Bed: 4W09C/4W09C-01 Payor Info: Payor: MEDICARE / Plan: MEDICARE PART A AND B / Product Type: *No Product type* /    Admitting Diagnosis: mestatic prostate cancer to pelvis  Admit Date/Time:  03/27/2014  6:55 PM Admission Comments: No comment available   Primary Diagnosis:  <principal problem not specified> Principal Problem: <principal problem not specified>  Patient Active Problem List   Diagnosis Date Noted  . Physical deconditioning 03/27/2014  . Intractable pain 03/22/2014  . Pathologic pelvic fracture 03/22/2014  . S/P TAVR (transcatheter aortic valve replacement) 01/14/2014  . Acute on chronic systolic and diastolic heart failure, NYHA class 4 01/07/2014  . Severe aortic stenosis   . PAF (paroxysmal atrial fibrillation)   . Chronic diastolic congestive heart failure   . Acute on chronic diastolic heart failure   . Aortic valve disorders 01/01/2014  . Pleural effusion, left 12/30/2013  . Hypokalemia 12/30/2013  . CAP (community acquired pneumonia) 12/30/2013  . Nausea and vomiting 12/30/2013  . Dyslipidemia 12/30/2013  . Weakness 12/30/2013  . Failure to thrive 12/30/2013  . Moderate protein-calorie malnutrition 12/30/2013  . Bone metastases 09/24/2013  . Metastasis from malignant tumor of prostate   . Renal insufficiency 04/02/2013  . Hypertension 03/29/2013  . H/O: CVA (cerebrovascular accident) April 2014 03/29/2013  . Prostate cancer with bone mets 09/11/2009    Expected Discharge Date: Expected Discharge Date: 04/09/14 (vs. SNF)  Team Members Present: Physician leading conference: Dr. Alger Simons Social Worker Present: Lennart Pall, LCSW Nurse Present: Elliot Cousin, RN PT Present: Canary Brim, PT OT Present: Willeen Cass, OT SLP  Present: Weston Anna, SLP PPS Coordinator present : Daiva Nakayama, RN, CRRN;Becky Alwyn Ren, PT     Current Status/Progress Goal Weekly Team Focus  Medical   prostate cancer with mets to pelvis. ;pain and stamina issues  increase activity tolerance  pain mgt, pacing and finding appropriate activity level   Bowel/Bladder   incontinent of bowel this morning  continent of both  toilet q 3-4 hours prn   Swallow/Nutrition/ Hydration             ADL's   Setup with seated UB B&D, Max A LB B&D, Max A SPT and toileting  Overall Min A for ADL  Endurance, dynamic balance (sitting/standing), pain management, AE training, transfers   Mobility   limited participation with therapies; overall max A  S/min A overall  endurance, functional strengthening, OOB tolerance, transfers, gait, standing balance/endurance, w/c mobility   Communication             Safety/Cognition/ Behavioral Observations            Pain   Pain to right hip and lower back. On scheduled ms contin and lidocaine patch to site.  pain less than 2  assess for pain and medicate when necessary.   Skin   Blister to right hip. foam dressing in place.  no new skin breakdown.  assess skin q shift and prn    Rehab Goals Patient on target to meet rehab goals: No Rehab Goals Revised: slow progress and poor tolerance overall *See Care Plan and progress notes for long and short-term goals.  Barriers to Discharge: activity tolerance and pain    Possible Resolutions to Barriers:  adjusting schedule, pacing    Discharge Planning/Teaching Needs:  home with wife to provide/ arrange 24/7 assist vs. SNF      Team Discussion:  New eval.  Some decrease in overall pain and nausea today.  Very poor endurance and need to decrease therapy schedule.  Anticipate minimal assist goals at best - concern about whether wife can/ will provide this.  SW to follow up and determine if plan to change to SNF  Revisions to Treatment Plan:  Change to 15/7  schedule.  Possible change in d/c plan.   Continued Need for Acute Rehabilitation Level of Care: The patient requires daily medical management by a physician with specialized training in physical medicine and rehabilitation for the following conditions: Daily direction of a multidisciplinary physical rehabilitation program to ensure safe treatment while eliciting the highest outcome that is of practical value to the patient.: Yes Daily medical management of patient stability for increased activity during participation in an intensive rehabilitation regime.: Yes Daily analysis of laboratory values and/or radiology reports with any subsequent need for medication adjustment of medical intervention for : Neurological problems;Post surgical problems  Lennart Pall 04/02/2014, 11:34 AM

## 2014-04-02 NOTE — Progress Notes (Signed)
Social Work  Social Work Assessment and Plan  Patient Details  Name: Lucas Green MRN: [id number] Date of Birth: 02/18/1932  Today's Date: 03/28/2014  Problem List:  Patient Active Problem List   Diagnosis Date Noted  . Physical deconditioning 03/27/2014  . Intractable pain 03/22/2014  . Pathologic pelvic fracture 03/22/2014  . S/P TAVR (transcatheter aortic valve replacement) 01/14/2014  . Acute on chronic systolic and diastolic heart failure, NYHA class 4 01/07/2014  . Severe aortic stenosis   . PAF (paroxysmal atrial fibrillation)   . Chronic diastolic congestive heart failure   . Acute on chronic diastolic heart failure   . Aortic valve disorders 01/01/2014  . Pleural effusion, left 12/30/2013  . Hypokalemia 12/30/2013  . CAP (community acquired pneumonia) 12/30/2013  . Nausea and vomiting 12/30/2013  . Dyslipidemia 12/30/2013  . Weakness 12/30/2013  . Failure to thrive 12/30/2013  . Moderate protein-calorie malnutrition 12/30/2013  . Bone metastases 09/24/2013  . Metastasis from malignant tumor of prostate   . Renal insufficiency 04/02/2013  . Hypertension 03/29/2013  . H/O: CVA (cerebrovascular accident) April 2014 03/29/2013  . Prostate cancer with bone mets 09/11/2009   Past Medical History:  Past Medical History  Diagnosis Date  . Hypertension   . GERD (gastroesophageal reflux disease)   . Dyslipidemia   . Aortic stenosis   . Prostate cancer 09/2009  . 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 Hearing loss   . Incontinence of urine   . 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 Degenerative arthritis     osteoarthritis  . Stroke 03/29/13  . Hx of radiation therapy 09/30/13- 10/11/13    right hip/ischium, 3000 cGy 10 sessions  . Esophageal stricture   . Hiatal hernia   . Diverticulosis   . Moderate protein-calorie malnutrition 12/30/2013  . Failure to thrive 12/30/2013  . Pleural effusion, left 12/30/2013  . Severe aortic stenosis   . Atrial  fibrillation   . Chronic diastolic congestive heart failure   . Acute on chronic diastolic heart failure   . 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 Tonsillectomy  1938  . Cryosurgery prostate  09/2009    prostate cancer, Gleason 7  . Intraoperative transesophageal echocardiogram N/A 01/14/2014    Procedure: INTRAOPERATIVE TRANSESOPHAGEAL ECHOCARDIOGRAM;  Surgeon: Rexene Alberts, MD;  Location: [facility];  Service: Open Heart Surgery;  Laterality: N/A;  . Transcatheter aortic valve replacement, transaortic N/A 01/14/2014    Procedure: TRANSCATHETER AORTIC VALVE REPLACEMENT, TRANSAORTIC;  Surgeon: Rexene Alberts, MD;  Location: [facility];  Service: Open Heart Surgery;  Laterality: N/A;  . Chest tube insertion Left 01/14/2014    Procedure: CHEST TUBE INSERTION;  Surgeon: Rexene Alberts, MD;  Location: [facility];  Service: Open Heart Surgery;  Laterality: Left;   Social History:  reports that he has never smoked. He has never used smokeless tobacco. He reports that he drinks alcohol. He reports that he does not use illicit drugs.  Family / Support Systems Marital Status: Married Patient Roles: Spouse;Parent (Has a wife and a son.) Spouse/Significant Other: wife, Rosemarie Ax @ (H) [phone] or (C) [phone] Children: one son, Macklin Jacquin - local Anticipated Caregiver: Rosemarie Ax and self Ability/Limitations of Caregiver: Wife works Medical laboratory scientific officer for the [employer], but they are  flexible with her schedule. Caregiver Availability: Intermittent Family Dynamics: wife is very supportive and willing to do anything she can, however, cannot provide much physical support or 24/7 care  Social History Preferred language: English Religion: Non-Denominational Cultural Background: NA Education: retired  MD Read: Yes Write: Yes Employment Status: Retired Date Retired/Disabled/Unemployed: 2014 Freight forwarder Issues: none Guardian/Conservator: none - per MD, pt capable of making decisions on his own behalf   Abuse/Neglect Physical Abuse: Denies Verbal Abuse: Denies Sexual Abuse: Denies Exploitation of patient/patient's resources: Denies Self-Neglect: Denies  Emotional Status Pt's affect, behavior adn adjustment status: pt familiar to me from his prior stay on CIR last year.  Pleasant and fully oriented.  States, "It's been quite a year" with multiple medical issues.  Admits most of his frustration is with continued nausea and pain.  He speaks easily about his CA mets with pelvic involvement as "not good...just wiping me out."  Denies any significant emotional distress, however, will monitor and refer to neuropsych as needed. Recent Psychosocial Issues: CVA last year and cardiac surgery in Feb Pyschiatric History: none Substance Abuse History: none  Patient / Family Perceptions, Expectations & Goals Pt/Family understanding of illness & functional limitations: pt and wife with very good understanding of his CA mets, treatments completed and current functional limitations.  Very realistic about his longer term care needs. Premorbid pt/family roles/activities: Wife was pt's caregiver PTA but  pt was much more independent Anticipated changes in roles/activities/participation: wife will continue to provide caregiver support, however, pt likley needs more physical assist than she can provide Pt/family expectations/goals: pt and wife anticipate the need to pursue SNF following [place]: None Premorbid Home Care/DME Agencies: Other (Comment) (OP tx @ [facility] in past) Transportation available at discharge: yes Resource referrals recommended: Neuropsychology;Support group (specify);Advocacy groups  Discharge Planning Living Arrangements:  Spouse/significant other Support Systems: Spouse/significant other;Children Type of Residence: Private residence Insurance Resources: [employer] Financial Resources: Social Security Financial Screen Referred: No Living Expenses: Own Money Management: Spouse Does the patient have any problems obtaining your medications?: No Home Management: wife  Patient/Family Preliminary Plans: pt and wife feel will likely need to pursue SNF upon d/c Barriers to Discharge: Self care;Family Support (wife still working) Social Work Anticipated Follow Up Needs: SNF Expected length of stay: TBD  Clinical Impression Pleasant gentleman here with CA mets to pelvis and spine.  Familiar to CIR from prior stay in 2014.  Wife very supportive, however, cannot provide 24/7 care or much physical assist.  Will likely need to transition to SNF.  Will follow for support and d/c planning.  Lennart Pall 03/28/2014, 3:28 PM

## 2014-04-02 NOTE — Progress Notes (Signed)
Occupational Therapy Session Note  Patient Details  Name: Lucas Green MRN: [id number] Date of Birth: 1932/04/03  Today's Date: 04/02/2014 Time: 7342-8768 Time Calculation (min): 40 min  Short Term Goals: Week 1:  OT Short Term Goal 1 (Week 1): Patient will complete transfer in/out of walk-in shower with mod assist OT Short Term Goal 2 (Week 1): Patient will complete lower body bathing sitting and standing using AE, prn, with mod assist OT Short Term Goal 3 (Week 1): Patient will complete upper body bathing and dressing seated at sink with min assist OT Short Term Goal 4 (Week 1): Patient will perform 50% of clothing management task for toileting standing at RW with steadying assist  Skilled Therapeutic Interventions/Progress Updates:  Upon entering room, patient found supine in bed with complaints of nausea. With min encouragement, patient willing to work with therapist. Therapist assisted with re-positioning patient in bed and patient demonstrated knowledge of BLE bed level exercises. From here, patient sat EOB with supervision for BUE & BLE strengthening (body weight) exercises. During exercises, therapist encouraged anterior lean, patient tended to lean posteriorly secondary to decreased strength and flexibility. Patient with 4/10 complaints of pain while seated EOB after exercises and requested to lay back down. Patient eager to "rest" and states he needs his rest to get stronger. Therapist educated patient on importance of exercises, getting OOB and OOB tolerance. At end of session, left patient supine in bed with HOB slightly raised and all needs within reach. Patient with complaints of some diplopia during reading activities (post stroke).   Precautions:  Precautions Precautions: Fall Precaution Comments: Notify physician for pulse less than 55 or greater than 120, respiratory rate less than 12 or greater than 25, temperature greater than 100.5 F, urinary output less than 30 mL/hr  for four hours, systolic BP less than 90 or greater than 115, diastolic BP less than 60 or greater than 100.  Prostate CA with mets to pelvis; pain; AFIB and CHF; monitor vitals Restrictions Weight Bearing Restrictions: No Other Position/Activity Restrictions: PA states no activity restrictions; exercise to pt pain tolerance; may want to avoid resisted exercises or use of Nustep secondary to pain (pt reports use of bike and Nustep at cardiac rehab were very painful)  See FIM for current functional status  Therapy/Group: Individual Therapy  Estelle June 04/02/2014, 2:28 PM

## 2014-04-02 NOTE — Progress Notes (Signed)
Social Work Patient ID: Lucas Green, male   DOB: 1932-11-01, 80 y.o.   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  Physical deconditioning  03/27/2014   .  Intractable pain  03/22/2014   .  Pathologic pelvic fracture  03/22/2014   .  S/P TAVR (transcatheter aortic valve replacement)  01/14/2014   .  Acute on chronic systolic and diastolic heart failure, NYHA class 4  01/07/2014   .  Severe aortic stenosis     .  PAF (paroxysmal atrial fibrillation)     .  Chronic diastolic congestive heart failure     .  Acute on chronic diastolic heart failure     .  Aortic valve disorders  01/01/2014   .  Pleural effusion, left  12/30/2013   .  Hypokalemia  12/30/2013   .  CAP (community acquired pneumonia)  12/30/2013   .  Nausea and vomiting  12/30/2013   .  Dyslipidemia  12/30/2013   .  Weakness  12/30/2013   .  Failure to thrive  12/30/2013   .  Moderate protein-calorie malnutrition  12/30/2013   .  Bone metastases  09/24/2013   .  Metastasis from malignant tumor of prostate     .  Renal insufficiency  04/02/2013   .  Hypertension  03/29/2013   .  H/O: CVA (cerebrovascular accident) April 2014  03/29/2013   .  Prostate cancer with bone mets  09/11/2009     Expected  Discharge Date: Expected Discharge Date: 04/09/14 (vs. SNF)  Team Members Present: Physician leading conference: Dr. Alger Simons Social Worker Present: Lennart Pall, LCSW Nurse Present: Elliot Cousin, RN PT Present: Canary Brim, PT OT Present: Willeen Cass, OT SLP Present: Weston Anna, SLP PPS Coordinator present : Daiva Nakayama, RN, CRRN;Becky Alwyn Ren, PT        Current Status/Progress  Goal  Weekly Team Focus   Medical     prostate cancer with mets to pelvis. ;pain and stamina issues  increase activity tolerance  pain mgt, pacing and finding appropriate activity level   Bowel/Bladder     incontinent of bowel this morning  continent of both  toilet q 3-4 hours prn   Swallow/Nutrition/ Hydration            ADL's     Setup with seated UB B&D, Max A LB B&D, Max A SPT and toileting  Overall Min A for ADL  Endurance, dynamic balance (sitting/standing), pain management, AE training, transfers   Mobility     limited participation with therapies; overall max A  S/min A overall  endurance, functional strengthening, OOB tolerance, transfers, gait, standing balance/endurance, w/c mobility   Communication  Safety/Cognition/ Behavioral Observations           Pain     Pain to right hip and lower back. On scheduled ms contin and lidocaine patch to site.  pain less than 2  assess for pain and medicate when necessary.   Skin     Blister to right hip. foam dressing in place.  no new skin breakdown.  assess skin q shift and prn    Rehab Goals Patient on target to meet rehab goals: No Rehab Goals Revised: slow progress and poor tolerance overall *See Care Plan and progress notes for long and short-term goals.    Barriers to Discharge:  activity tolerance and pain      Possible Resolutions to Barriers:    adjusting schedule, pacing      Discharge Planning/Teaching Needs:    home with wife to provide/ arrange 24/7 assist vs. SNF      Team Discussion:    New eval.  Some  decrease in overall pain and nausea today.  Very poor endurance and need to decrease therapy schedule.  Anticipate minimal assist goals at best - concern about whether wife can/ will provide this.  SW to follow up and determine if plan to change to SNF   Revisions to Treatment Plan:    Change to 15/7 schedule.  Possible change in d/c plan.    Continued Need for Acute Rehabilitation Level of Care: The patient requires daily medical management by a physician with specialized training in physical medicine and rehabilitation for the following conditions: Daily direction of a multidisciplinary physical rehabilitation program to ensure safe treatment while eliciting the highest outcome that is of practical value to the patient.: Yes Daily medical management of patient stability for increased activity during participation in an intensive rehabilitation regime.: Yes Daily analysis of laboratory values and/or radiology reports with any subsequent need for medication adjustment of medical intervention for : Neurological problems;Post surgical problems  Lennart Pall 04/02/2014, 11:34 AM

## 2014-04-02 NOTE — Progress Notes (Signed)
Inpatient [facility] Individual Statement of Services  Patient Name:  Lucas Green  Date:  03/28/2014  Welcome to the [facility].  Our goal is to provide you with an individualized program based on your diagnosis and situation, designed to meet your specific needs.  With this comprehensive rehabilitation program, you will be expected to participate in at least 3 hours of rehabilitation therapies Monday-Friday, with modified therapy programming on the weekends.  Your rehabilitation program will include the following services:  Physical Therapy (PT), Occupational Therapy (OT), 24 hour per day rehabilitation nursing, Therapeutic Recreaction (TR), Neuropsychology, Case Management (Social Worker), Rehabilitation Medicine, Nutrition Services and Pharmacy Services  Weekly team conferences will be held on Tuesdays to discuss your progress.  Your Social Worker will talk with you frequently to get your input and to update you on team discussions.  Team conferences with you and your family in attendance may also be held.  Expected length of stay: two weeks  Overall anticipated outcome: minimal assistance  Depending on your progress and recovery, your program may change. Your Social Worker will coordinate services and will keep you informed of any changes. Your Social Worker's name and contact numbers are listed  below.  The following services may also be recommended but are not provided by the [facility] will be made to provide these services after discharge if needed.  Arrangements include referral to agencies that provide these services.  Your insurance has been verified to be:  Medicare and Production manager Your primary doctor is:  Dr. Garlon Hatchet  Pertinent information will be shared with your doctor and  your insurance company.  Social Worker:  Lennart Pall, [place] or ([phone]   Information discussed with and copy given to patient by: Lennart Pall, 03/28/2014, 3:30 PM

## 2014-04-02 NOTE — Progress Notes (Signed)
Occupational Therapy Session Note  Patient Details  Name: Lucas Green MRN: [id number] Date of Birth: [birth year]  Today's Date: 04/02/2014 Time: 2423-5361 Time Calculation (min): 45 min  Short Term Goals: Week 1:  OT Short Term Goal 1 (Week 1): Patient will complete transfer in/out of walk-in shower with mod assist OT Short Term Goal 2 (Week 1): Patient will complete lower body bathing sitting and standing using AE, prn, with mod assist OT Short Term Goal 3 (Week 1): Patient will complete upper body bathing and dressing seated at sink with min assist OT Short Term Goal 4 (Week 1): Patient will perform 50% of clothing management task for toileting standing at RW with steadying assist  Skilled Therapeutic Interventions/Progress Updates: ADL-retraining with focus on improved endurance and use of AE during ADL.  Patient received supine in bed, emesis basin and pan at his side, complaining of continued nausea and emesis.  Patient agreed to participate in supine bathing and was instructed on use of LH sponge.   Patient tolerated approx 30 seconds of activity during bathing before requesting rest breaks resulting in poor participation throughout ADL.   Patient washed only his face and hands and required total assist for lower legs, arms, chest and back.   Patient declined change of clothing and elected to wear only hospital gown this session.   Patient expressed desire to terminate session early (15 min) due to nausea and fatigue.   OT discussed discharge plan with patient and spouse who reiterated plan to return to [facility] (rehab center) for continued rehab prior to return home, if feasible.   Patient and spouse acknowledged patient's deficits with endurance, pain management, and functional mobility as being too severe to suggest possible discharge home.   Patient was left in bed, with call light and phone placed within reach and spouse attending to his needs.     Therapy Documentation Precautions:   Precautions Precautions: Fall Precaution Comments: Notify physician for pulse less than 55 or greater than 120, respiratory rate less than 12 or greater than 25, temperature greater than 100.5 F, urinary output less than 30 mL/hr for four hours, systolic BP less than 90 or greater than 443, diastolic BP less than 60 or greater than 100.  Prostate CA with mets to pelvis; pain; AFIB and CHF; monitor vitals Restrictions Weight Bearing Restrictions: No Other Position/Activity Restrictions: PA states no activity restrictions; exercise to pt pain tolerance; may want to avoid resisted exercises or use of Nustep secondary to pain (pt reports use of bike and Nustep at cardiac rehab were very painful)  General: General Amount of Missed OT Time (min): 15 Minutes  Vital Signs: Therapy Vitals Pulse Rate: 85 BP: 139/68 mmHg  Pain: No/denies pain  See FIM for current functional status  Therapy/Group: Individual Therapy  Salome Spotted 04/02/2014, 11:25 AM

## 2014-04-02 NOTE — Progress Notes (Signed)
Physical Therapy Session Note  Patient Details  Name: Lucas Green MRN: [id number] Date of Birth: [birth year]  Today's Date: 04/02/2014 Time: 1525-1540 Time Calculation (min): 15 min  Short Term Goals: Week 1:  PT Short Term Goal 1 (Week 1): Pt will perform supine <> sit on flat bed mod A reporting pain <5/10 PT Short Term Goal 2 (Week 1): Pt will perform transfers bed <> w/c mod A with RW and reporting pain <5/10 PT Short Term Goal 3 (Week 1): Pt will perform w/c mobility x 50' with bilat UE and supervision for endurance PT Short Term Goal 4 (Week 1): Pt will perform gait x 15' with RW and mod A with pain < 5/10 PT Short Term Goal 5 (Week 1): Pt will perform up/down one step with RW and mod A with <5/10 pain  Skilled Therapeutic Interventions/Progress Updates:   Attempted to engage patient in therapeutic functional mobility for increased activity tolerance and strengthening. Pt reporting nausea and fatigue. Pt declined OOB as well as supine therex, stating he "already did that today." Pt agreeable to repositioning in bed. With HOB flat pt using LUE and BLE to scoot toward [facility] with min assist and rolling to R/L to straighten pad out under patient. MD present and discussed d/c planning with patient. Per patient report, he believes he will do better with a few more days of rest and when he can start moving without pain. Pt educated on importance of OOB activity, declined further therapeutic activity. Pt left semi reclined in bed with all needs within reach.   Therapy Documentation Precautions:  Precautions Precautions: Fall Precaution Comments: Notify physician for pulse less than 55 or greater than 120, respiratory rate less than 12 or greater than 25, temperature greater than 100.5 F, urinary output less than 30 mL/hr for four hours, systolic BP less than 90 or greater than 824, diastolic BP less than 60 or greater than 100.  Prostate CA with mets to pelvis; pain; AFIB and CHF; monitor  vitals Restrictions Weight Bearing Restrictions: No Other Position/Activity Restrictions: PA states no activity restrictions; exercise to pt pain tolerance; may want to avoid resisted exercises or use of Nustep secondary to pain (pt reports use of bike and Nustep at cardiac rehab were very painful) General: Amount of Missed PT Time (min): 30 Minutes Missed Time Reason: Patient fatigue;Patient ill (comment) (Pt reports nausea) Pain:  Denies pain currently but unwilling to attempt functional mobility due to anticipation of pain   See FIM for current functional status  Therapy/Group: Individual Therapy  Laretta Alstrom 04/02/2014, 4:44 PM

## 2014-04-02 NOTE — Progress Notes (Signed)
Social Work Patient ID: SYD NEWSOME, male   DOB: 1932-07-23, 80 y.o.   MRN: [id number]  Have reviewed team conference with pt and wife.  Aware targeted goals of minimal assistance overall and of limited progress due to fatigue and nausea.  Both agreed that d/c plan needs to be for pt to transfer to SNF from CIR.  Their first choice would be [facility] where he went following heart surgery earlier in the year.  Will send out FL2.  Continue to follow.  Lennart Pall, LCSW

## 2014-04-03 ENCOUNTER — Inpatient Hospital Stay (HOSPITAL_COMMUNITY): Payer: Medicare Other | Admitting: Physical Therapy

## 2014-04-03 ENCOUNTER — Inpatient Hospital Stay (HOSPITAL_COMMUNITY): Payer: Medicare Other

## 2014-04-03 LAB — PROTIME-INR
INR: 2.48 — AB (ref 0.00–1.49)
Prothrombin Time: 26 seconds — ABNORMAL HIGH (ref 11.6–15.2)

## 2014-04-03 MED ORDER — WARFARIN SODIUM 4 MG PO TABS
4.0000 mg | ORAL_TABLET | Freq: Once | ORAL | Status: AC
  Administered 2014-04-03: 4 mg via ORAL
  Filled 2014-04-03: qty 1

## 2014-04-03 NOTE — Progress Notes (Signed)
ANTICOAGULATION CONSULT NOTE - Follow Up Consult  Pharmacy Consult for Coumadin Indication: atrial fibrillation; recent TVAR (01/2014) due to severe AVS  Allergies  Allergen Reactions  . Other     Beer and Wine- swelling/headache/nausea    Patient Measurements: Height: 6' 2.02" (188 cm) Weight: 191 lb 12.8 oz (87 kg) IBW/kg (Calculated) : 82.24  Vital Signs: Temp: 98.5 F (36.9 C) (04/23 0548) Temp src: Oral (04/23 0548) BP: 124/61 mmHg (04/23 0548) Pulse Rate: 89 (04/23 0548)  Labs:  Recent Labs  04/01/14 0707 04/02/14 0608 04/03/14 0550  LABPROT 28.8* 28.1* 26.0*  INR 2.83* 2.74* 2.48*    Estimated Creatinine Clearance: 79.1 ml/min (by C-G formula based on Cr of 0.88).  Assessment: 80 y.o. Green with metastatic prostate cancer on coumadin PTA for afib.   INR remains therapeutic at 2.48 today.  No bleeding noted.Appeared patient may need lower than previous home dose of 6mg  daily. INR had been in high end of therapeutic goal, now steadily decreasing on 3mg  daily but still therapeutic.     Goal of Therapy:  INR 2-3 Monitor platelets by anticoagulation protocol: Yes   Plan:  1. Coumadin 4mg  po today x1  2. Daily INR. Monitor for bleeding complications.  3. Coumadin educated on 04/Lucas/15   Nicole Cella, [place] Clinical Pharmacist Pager: [phone] 04/03/2014,12:07 PM

## 2014-04-03 NOTE — Progress Notes (Signed)
Physical Therapy Note  Patient Details  Name: Lucas Green MRN: [id number] Date of Birth: 01/21/1932 Today's Date: 04/03/2014  Time: 6122 Pt refused skilled PT 60 minutes due to c/o nausea and fatigue.  Pt educated on importance of OOB activity to improve strength, pt states "I really just want to be left alone to sleep".   Kennith Gain 04/03/2014, 1:10 PM

## 2014-04-03 NOTE — Progress Notes (Signed)
Occupational Therapy Session Note  Patient Details  Name: PRITESH SOBECKI MRN: [id number] Date of Birth: Apr 18, 1932  Today's Date: 04/03/2014 Time: 1030-1130 Time Calculation (min): 60 min  Short Term Goals: Week 1:  OT Short Term Goal 1 (Week 1): Patient will complete transfer in/out of walk-in shower with mod assist OT Short Term Goal 2 (Week 1): Patient will complete lower body bathing sitting and standing using AE, prn, with mod assist OT Short Term Goal 3 (Week 1): Patient will complete upper body bathing and dressing seated at sink with min assist OT Short Term Goal 4 (Week 1): Patient will perform 50% of clothing management task for toileting standing at RW with steadying assist  Skilled Therapeutic Interventions/Progress Updates: ADL-retraining with focus on improved transfers, improved sit><stand, dynamic standing balance, endurance, and toileting.   Patient received supine in bed, agreeable to planned ADL (bathing at shower level) after requesting assist to toilet.   After min assist with bed mobility, patient required mod assist to rise from edge bed to RW in prep for transfer to w/c > toilet > tub bench.   Patient requires extra time to execute motor plan during transfers but is able to progress with min verbal cues for technique and manual facilitation to correct posture, steadying assist to maintain balance.   Patient completed toilet transfer (mod A), passed loose BM and attempted toilet hygiene (pt=30%) while standing supported at w/c but rejected use of RW to move from toilet to tub bench.   Patient completed approx 50% of bathing task and rested prior to assist with upper body dressing.  Patient was too fatigued to attempt lower body dressing and was assisted to bed to recover with wife present and call bell placed within reach.  Therapy Documentation Precautions:  Precautions Precautions: Fall Precaution Comments: Notify physician for pulse less than 55 or greater than 120,  respiratory rate less than 12 or greater than 25, temperature greater than 100.5 F, urinary output less than 30 mL/hr for four hours, systolic BP less than 90 or greater than 650, diastolic BP less than 60 or greater than 100.  Prostate CA with mets to pelvis; pain; AFIB and CHF; monitor vitals Restrictions Weight Bearing Restrictions: No Other Position/Activity Restrictions: PA states no activity restrictions; exercise to pt pain tolerance; may want to avoid resisted exercises or use of Nustep secondary to pain (pt reports use of bike and Nustep at cardiac rehab were very painful)  Pain: Pain Assessment Pain Assessment: No/denies pain Pain Score: 0-No pain  ADL: ADL ADL Comments: see FIM  See FIM for current functional status  Therapy/Group: Individual Therapy  Salome Spotted 04/03/2014, 11:46 AM

## 2014-04-03 NOTE — Progress Notes (Signed)
[facility] PHYSICAL MEDICINE & REHABILITATION     PROGRESS NOTE    Subjective/Complaints: Had a good night. Nausea is better today.  A 12 point review of systems has been performed and if not noted above is otherwise negative.   Objective: Vital Signs: Blood pressure 124/61, pulse 89, temperature 98.5 F (36.9 C), temperature source Oral, resp. rate 18, height 6' 2.02" (1.88 m), weight 87 kg (191 lb 12.8 oz), SpO2 99.00%. No results found. No results found for this basename: WBC, HGB, HCT, PLT,  in the last 72 hours No results found for this basename: NA, K, CL, CO, GLUCOSE, BUN, CREATININE, CALCIUM,  in the last 72 hours CBG (last 3)  No results found for this basename: GLUCAP,  in the last 72 hours  Wt Readings from Last 3 Encounters:  04/03/14 87 kg (191 lb 12.8 oz)  03/24/14 87.5 kg (192 lb 14.4 oz)  03/06/14 87.091 kg (192 lb)    Physical Exam:  Nursing note and vitals reviewed.  Constitutional: He is oriented to person, place, and time. He appears well-developed and well-nourished.  HENT:  Head: Normocephalic and atraumatic.  Eyes: Conjunctivae are normal. Pupils are equal, round, and reactive to light.  Neck: Normal range of motion. Neck supple.  Cardiovascular: Normal rate and regular rhythm. No murmurs, rubs, or gallops  No murmur heard.  Respiratory: Effort normal and breath sounds normal. No respiratory distress. He has no wheezes.  GI: Soft. Bowel sounds are normal.  Neurological: He is alert and oriented to person, place, and time. UES nearly 5/5. RLE 2- HF, 2+ KE and 4/5 ankle. LLE 2+ to 3 HF, 3+ KE and 4+ ankle. No sensory findings. CN exam normal. DTR's 1+. Cognitively appropriate Musc: right hip/groin tender to PROM and AROM.  Skin: Skin is warm and dry.  Psychiatric: His behavior is normal. Thought content normal. His mood appears anxious.    Assessment/Plan: 1. Functional deficits secondary to metastatic prostate cancer to the pelvis which require 3+  hours per day of interdisciplinary therapy in a comprehensive inpatient rehab setting. Physiatrist is providing close team supervision and 24 hour management of active medical problems listed below. Physiatrist and rehab team continue to assess barriers to discharge/monitor patient progress toward functional and medical goals.  SNF placement now pending.   FIM: FIM - Bathing Bathing Steps Patient Completed: Chest;Right Arm;Left Arm;Abdomen;Front perineal area;Buttocks Bathing: 1: Total-Patient completes 0-2 of 10 parts or less than 25%  FIM - Upper Body Dressing/Undressing Upper body dressing/undressing steps patient completed: Thread/unthread right sleeve of pullover shirt/dresss;Thread/unthread left sleeve of pullover shirt/dress;Put head through opening of pull over shirt/dress;Pull shirt over trunk Upper body dressing/undressing: 0: Wears gown/pajamas-no public clothing FIM - Lower Body Dressing/Undressing Lower body dressing/undressing steps patient completed: Fasten/unfasten pants Lower body dressing/undressing: 0: Wears Interior and spatial designer  FIM - Toileting Toileting steps completed by patient: Performs perineal hygiene Toileting Assistive Devices: Grab bar or rail for support Toileting: 2: Max-Patient completed 1 of 3 steps  FIM - Radio producer Devices: Bedside commode;Grab bars Toilet Transfers: 3-To toilet/BSC: Mod A (lift or lower assist);3-From toilet/BSC: Mod A (lift or lower assist)  FIM - Bed/Chair Transfer Bed/Chair Transfer Assistive Devices: HOB elevated;Bed rails;Arm rests Bed/Chair Transfer: 0: Activity did not occur  FIM - Locomotion: Wheelchair Distance: Pt initially required total A for w/c to gym x 150'; when cued to begin w/c propulsion with UE back to room pt only able to perform 10' before reporting fatigue; returned to  room with total A Locomotion: Wheelchair: 0: Activity did not occur FIM - Locomotion:  Ambulation Locomotion: Ambulation Assistive Devices: Administrator Ambulation/Gait Assistance: 3: Mod assist Locomotion: Ambulation: 0: Activity did not occur  Comprehension Comprehension Mode: Auditory Comprehension: 5-Follows basic conversation/direction: With no assist  Expression Expression Mode: Verbal Expression: 5-Expresses basic needs/ideas: With no assist  Social Interaction Social Interaction: 6-Interacts appropriately with others with medication or extra time (anti-anxiety, antidepressant).  Problem Solving Problem Solving: 5-Solves basic 90% of the time/requires cueing < 10% of the time  Memory Memory: 6-More than reasonable amt of time  Medical Problem List and Plan:  Metastatic prostate cancer to the pelvis  1. DVT Prophylaxis/Anticoagulation: Pharmaceutical: Coumadin  2. Right hip pain/Pain Management: current adjustments/regimen proved effective so far  -continue po dilaudid prn -increased ms contin to 30mg  bid with good results -appropriate pacing/rest breaks etc  -increased lidoderm patches to 3 daily. 3. Mood: LCSW to follow for evaluation and support.  4. Neuropsych: This patient is capable of making decisions on his own behalf.  5. A Fib/TAVR 01/2014: Continue coumadin  6. H/O CVA: continue ASA for secondary stroke prevention.  7. NICM/Chronic combined systolic and diastolic CHF: Off diuretics and No ace due to low BP. Low salt diet.  -weight staying around 87kg 8. Constipation: Continue miralax bid and Senna S. dulc suppository and SSE prn 9. Chronic fatigue with anorexia: supplements/ push po  LOS (Days) 7 A FACE TO FACE EVALUATION WAS PERFORMED  Meredith Staggers 04/03/2014 7:53 AM

## 2014-04-04 ENCOUNTER — Inpatient Hospital Stay (HOSPITAL_COMMUNITY): Payer: Medicare Other | Admitting: Physical Therapy

## 2014-04-04 ENCOUNTER — Inpatient Hospital Stay (HOSPITAL_COMMUNITY): Payer: Medicare Other

## 2014-04-04 DIAGNOSIS — C61 Malignant neoplasm of prostate: Secondary | ICD-10-CM

## 2014-04-04 DIAGNOSIS — N2881 Hypertrophy of kidney: Secondary | ICD-10-CM

## 2014-04-04 DIAGNOSIS — C7952 Secondary malignant neoplasm of bone marrow: Secondary | ICD-10-CM

## 2014-04-04 DIAGNOSIS — C7951 Secondary malignant neoplasm of bone: Secondary | ICD-10-CM

## 2014-04-04 DIAGNOSIS — J189 Pneumonia, unspecified organism: Secondary | ICD-10-CM

## 2014-04-04 LAB — PROTIME-INR
INR: 2.4 — ABNORMAL HIGH (ref 0.00–1.49)
PROTHROMBIN TIME: 25.4 s — AB (ref 11.6–15.2)

## 2014-04-04 MED ORDER — WARFARIN SODIUM 4 MG PO TABS
4.0000 mg | ORAL_TABLET | Freq: Once | ORAL | Status: AC
  Administered 2014-04-04: 4 mg via ORAL
  Filled 2014-04-04: qty 1

## 2014-04-04 MED ORDER — ONDANSETRON HCL 4 MG PO TABS
4.0000 mg | ORAL_TABLET | Freq: Two times a day (BID) | ORAL | Status: DC
  Administered 2014-04-04 – 2014-04-05 (×3): 4 mg via ORAL
  Filled 2014-04-04 (×6): qty 1

## 2014-04-04 NOTE — Progress Notes (Signed)
Coumadin per pharmacy  - 32 yoM w/ metastatic prostate CA admitted 4/11 w/ R hip pain. Pt on warfarin PTA for AFib. Transferred to [facility] 4/14 for XRT treatments. Warfarin per RX. INR therapeutic 2.4. INR had been in uppper end of therapeutic range, appeared may need lower than pta home dose of 6mg  QD. But steadily decreasing on 3mg  daily. No bleeding noted. Hgb (4/17) low but stable.  GOAL: INR 2-3  PLAN:  1. Coumadin 4 mg x1 2. qAM INR. Monitor for bleeding complications.  3. Coumadin educated on 03/28/14

## 2014-04-04 NOTE — Progress Notes (Signed)
[place] PHYSICAL MEDICINE & REHABILITATION     PROGRESS NOTE    Subjective/Complaints: Had a good night. Nausea is better today after another episode late yesterday afternoon/evening.  A 12 point review of systems has been performed and if not noted above is otherwise negative.   Objective: Vital Signs: Blood pressure 112/67, pulse 78, temperature 97.7 F (36.5 C), temperature source Oral, resp. rate 18, height 6' 2.02" (1.88 m), weight 86.7 kg (191 lb 2.2 oz), SpO2 96.00%. No results found. No results found for this basename: WBC, HGB, HCT, PLT,  in the last 72 hours No results found for this basename: NA, K, CL, CO, GLUCOSE, BUN, CREATININE, CALCIUM,  in the last 72 hours CBG (last 3)  No results found for this basename: GLUCAP,  in the last 72 hours  Wt Readings from Last 3 Encounters:  04/04/14 86.7 kg (191 lb 2.2 oz)  03/24/14 87.5 kg (192 lb 14.4 oz)  03/06/14 87.091 kg (192 lb)    Physical Exam:  Nursing note and vitals reviewed.  Constitutional: He is oriented to person, place, and time. He appears well-developed and well-nourished.  HENT:  Head: Normocephalic and atraumatic.  Eyes: Conjunctivae are normal. Pupils are equal, round, and reactive to light.  Neck: Normal range of motion. Neck supple.  Cardiovascular: Normal rate and regular rhythm. No murmurs, rubs, or gallops  No murmur heard.  Respiratory: Effort normal and breath sounds normal. No respiratory distress. He has no wheezes.  GI: Soft. Bowel sounds are normal.  Neurological: He is alert and oriented to person, place, and time. UES nearly 5/5. RLE 2- HF, 2+ KE and 4/5 ankle. LLE 2+ to 3 HF, 3+ KE and 4+ ankle. No sensory findings. CN exam normal. DTR's 1+. Cognitively appropriate Musc: right hip/groin tender to PROM and AROM.  Skin: Skin is warm and dry.  Psychiatric: His behavior is normal. Thought content normal. His mood appears anxious.    Assessment/Plan: 1. Functional deficits secondary to  metastatic prostate cancer to the pelvis which require 3+ hours per day of interdisciplinary therapy in a comprehensive inpatient rehab setting. Physiatrist is providing close team supervision and 24 hour management of active medical problems listed below. Physiatrist and rehab team continue to assess barriers to discharge/monitor patient progress toward functional and medical goals.  SNF placement still pending.   FIM: FIM - Bathing Bathing Steps Patient Completed: Chest;Right Arm;Abdomen;Front perineal area;Right upper leg;Left upper leg Bathing: 3: Mod-Patient completes 5-7 7f 10 parts or 50-74%  FIM - Upper Body Dressing/Undressing Upper body dressing/undressing steps patient completed: Thread/unthread right sleeve of pullover shirt/dresss;Thread/unthread left sleeve of pullover shirt/dress;Put head through opening of pull over shirt/dress;Pull shirt over trunk Upper body dressing/undressing: 5: Set-up assist to: Obtain clothing/put away FIM - Lower Body Dressing/Undressing Lower body dressing/undressing steps patient completed: Fasten/unfasten pants Lower body dressing/undressing: 0: Wears Interior and spatial designer  FIM - Toileting Toileting steps completed by patient: Performs perineal hygiene Toileting Assistive Devices: Grab bar or rail for support Toileting: 2: Max-Patient completed 1 of 3 steps  FIM - Radio producer Devices: Grab bars;Bedside commode Toilet Transfers: 3-To toilet/BSC: Mod A (lift or lower assist);3-From toilet/BSC: Mod A (lift or lower assist)  FIM - Bed/Chair Transfer Bed/Chair Transfer Assistive Devices: Bed rails;Arm rests;HOB elevated Bed/Chair Transfer: 4: Supine > Sit: Min A (steadying Pt. > 75%/lift 1 leg);3: Bed > Chair or W/C: Mod A (lift or lower assist);3: Chair or W/C > Bed: Mod A (lift or lower assist)  FIM - Locomotion: Wheelchair Distance: Pt initially required total A for w/c to gym x 150'; when cued to  begin w/c propulsion with UE back to room pt only able to perform 10' before reporting fatigue; returned to room with total A Locomotion: Wheelchair: 0: Activity did not occur FIM - Locomotion: Ambulation Locomotion: Ambulation Assistive Devices: Administrator Ambulation/Gait Assistance: 3: Mod assist Locomotion: Ambulation: 0: Activity did not occur  Comprehension Comprehension Mode: Auditory Comprehension: 5-Follows basic conversation/direction: With no assist  Expression Expression Mode: Verbal Expression: 5-Expresses basic needs/ideas: With no assist  Social Interaction Social Interaction: 6-Interacts appropriately with others with medication or extra time (anti-anxiety, antidepressant).  Problem Solving Problem Solving: 5-Solves basic 90% of the time/requires cueing < 10% of the time  Memory Memory: 6-More than reasonable amt of time  Medical Problem List and Plan:  Metastatic prostate cancer to the pelvis  1. DVT Prophylaxis/Anticoagulation: Pharmaceutical: Coumadin  2. Right hip pain/Pain Management: current adjustments/regimen proved effective so far  -continue po dilaudid prn -increased ms contin to 30mg  bid with good results -appropriate pacing/rest breaks etc  -increased lidoderm patches to 3 daily. 3. Mood: LCSW to follow for evaluation and support.  4. Neuropsych: This patient is capable of making decisions on his own behalf.  5. A Fib/TAVR 01/2014: Continue coumadin  6. H/O CVA: continue ASA for secondary stroke prevention.  7. NICM/Chronic combined systolic and diastolic CHF: Off diuretics and No ace due to low BP. Low salt diet.  -weight staying around 87kg 8. Constipation: Continue miralax bid and Senna S. dulc suppository and SSE prn 9. Chronic fatigue with anorexia: supplements/ push po 10. Persistent nausea: may be XRT related, narcs not helping. Bowels are moving  -zofran scheduled and prnprn  LOS (Days) 8 A FACE TO FACE EVALUATION WAS  PERFORMED  Meredith Staggers 04/04/2014 7:29 AM

## 2014-04-04 NOTE — Progress Notes (Signed)
Occupational Therapy Session and Weekly Progress Note  Patient Details  Name: Lucas Green MRN: [id number] Date of Birth: November 22, 1932  Beginning of progress report period: March 28, 2014 End of progress report period: April 04, 2014  Today's Date: 04/04/2014 Time: 3354-5625 Time Calculation (min): 56 min  Patient has met 4 of 4 short term goals.    Patient continues to demonstrate the following deficits: Impaired endurance, impaired pain management, impaired dynamic standing balance, impaired transfer skills, and decreased ability to perform self-care and therefore will continue to benefit from skilled OT intervention to enhance overall performance with BADL.  Patient progressing toward long term goals..  Continue plan of care.  OT Short Term Goals Week 1:  OT Short Term Goal 1 (Week 1): Patient will complete transfer in/out of walk-in shower with mod assist OT Short Term Goal 1 - Progress (Week 1): Met OT Short Term Goal 2 (Week 1): Patient will complete lower body bathing sitting and standing using AE, prn, with mod assist OT Short Term Goal 2 - Progress (Week 1): Met OT Short Term Goal 3 (Week 1): Patient will complete upper body bathing and dressing seated at sink with min assist OT Short Term Goal 3 - Progress (Week 1): Met OT Short Term Goal 4 (Week 1): Patient will perform 50% of clothing management task for toileting standing at RW with steadying assist OT Short Term Goal 4 - Progress (Week 1): Met Week 2:  OT Short Term Goal 1 (Week 2): Patient will complete lower body bathing supine or sitting and standing using AE, prn, with min assist OT Short Term Goal 2 (Week 2): Patient will complete toileting with min assist OT Short Term Goal 3 (Week 2): Patient will completed SPT from w/c to [facility] with min assist OT Short Term Goal 4 (Week 2): Patient will complete HEP to improved endurance and strength at bil UE with supervision  Skilled Therapeutic Interventions/Progress Updates:  ADL-retraining with focus on improved activity tolerance, bed mobility, adapted bathing/dressing skills (supine), and toileting (using bedpan).   Patient received supine in bed with emesis basin within reach and spouse at his side.  Patient reports continued nausea and he claimed that he had hoped to participate in another shower this session but was now unable due to nausea.   Patient agreed to pan bath while supine.   With setup assist to provide supplies patient toileted on bedpan after removing diaper (pt=30%) but required max assist for hygiene due to loose BM and pain.   Patient was then provided washcloth and basin and directed to wash upper body, stomach, chest, and perineal area which he did with moderate thoroughness.   Patient donned pull-over shirt but deferred wearing pants due to continued need for bedpan and urinal.   Patient related that he has symptoms of headache, emesis, loose BM and chronic nausea if sitting upright which he suspects may be due to GI virus.   Patient progressed through session, conversing and responding to instructional cues and sustaining focus on task with 3 rest breaks provided.  OT educated patient on benefit of planned breaks during ADL to promote completion of task with lowest burden of care from caregiver(s).    Therapy Documentation Precautions:  Precautions Precautions: Fall Precaution Comments: Notify physician for pulse less than 55 or greater than 120, respiratory rate less than 12 or greater than 25, temperature greater than 100.5 F, urinary output less than 30 mL/hr for four hours, systolic BP less than 90 or greater  than 252, diastolic BP less than 60 or greater than 100.  Prostate CA with mets to pelvis; pain; AFIB and CHF; monitor vitals Restrictions Weight Bearing Restrictions: No Other Position/Activity Restrictions: PA states no activity restrictions; exercise to pt pain tolerance; may want to avoid resisted exercises or use of Nustep secondary to  pain (pt reports use of bike and Nustep at cardiac rehab were very painful) General: General Missed Time Reason: Patient fatigue Vital Signs: Therapy Vitals Pulse Rate: 75 BP: 119/70 mmHg Pain: Pain Assessment Pain Assessment: 0-10 Pain Score: 2  ADL: ADL ADL Comments: see FIM  See FIM for current functional status  Therapy/Group: Individual Therapy  Salome Spotted 04/04/2014, 11:57 AM

## 2014-04-04 NOTE — Progress Notes (Signed)
Physical Therapy Note  Patient Details  Name: Lucas Green MRN: [id number] Date of Birth: 01-28-1932 Today's Date: 04/04/2014  Time: 900-915 15 minutes (missed 45 minutes)  1:1 No c/o pain, pt c/o nausea.  Pt initially declines PT due to "not wanting to get worn out before I take a shower".  With encouragement, pt agreeable to sit up in recliner to wait for OT session.  Pt requires mod A for scooting hips for supine to sit.  Requires increased time and multiple attempts before able to perform sit to stand at mod A level, min A to stand pivot to recliner.  Pt encouraged to perform AROM while waiting for OT.   Kennith Gain 04/04/2014, 9:49 AM

## 2014-04-05 ENCOUNTER — Inpatient Hospital Stay (HOSPITAL_COMMUNITY): Payer: Medicare Other | Admitting: *Deleted

## 2014-04-05 DIAGNOSIS — C7952 Secondary malignant neoplasm of bone marrow: Secondary | ICD-10-CM

## 2014-04-05 DIAGNOSIS — C7951 Secondary malignant neoplasm of bone: Secondary | ICD-10-CM

## 2014-04-05 DIAGNOSIS — I5033 Acute on chronic diastolic (congestive) heart failure: Secondary | ICD-10-CM

## 2014-04-05 LAB — PROTIME-INR
INR: 2.53 — ABNORMAL HIGH (ref 0.00–1.49)
PROTHROMBIN TIME: 26.4 s — AB (ref 11.6–15.2)

## 2014-04-05 MED ORDER — WARFARIN SODIUM 4 MG PO TABS
4.0000 mg | ORAL_TABLET | Freq: Once | ORAL | Status: AC
  Administered 2014-04-05: 4 mg via ORAL
  Filled 2014-04-05: qty 1

## 2014-04-05 NOTE — Progress Notes (Signed)
Patient ID: Lucas Green, male   DOB: [birth year], 80 y.o.   MRN: [id number]    [place] PHYSICAL MEDICINE & REHABILITATION     PROGRESS NOTE   04/05/14.  Subjective/Complaints:  80 year old patient admitted to rehabilitation service with physical deconditioning secondary to metastatic prostate cancer with metastatic bone disease.  Main complaint is right hip pain.  Medical problems include dyslipidemia, aortic stenosis.  He has a history of paroxysmal atrial fibrillation and acute on chronic diastolic heart failure.  He has a pathological pelvic fracture. Had a good night. Nausea is better today after another episode late yesterday afternoon/evening.   Past Medical History  Diagnosis Date  . Hypertension   . GERD (gastroesophageal reflux disease)   . Dyslipidemia   . Aortic stenosis   . Prostate cancer 09/2009  . 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 Hearing loss   . Incontinence of urine   . 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 Degenerative arthritis     osteoarthritis  . Stroke 03/29/13  . Hx of radiation therapy 09/30/13- 10/11/13    right hip/ischium, 3000 cGy 10 sessions  . Esophageal stricture   . Hiatal hernia   . Diverticulosis   . Moderate protein-calorie malnutrition 12/30/2013  . Failure to thrive 12/30/2013  . Pleural effusion, left 12/30/2013  . Severe aortic stenosis   . Atrial fibrillation   . Chronic diastolic congestive heart failure   . Acute on chronic diastolic heart failure   . 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 Marital Status: Married    Spouse Name: N/A    Number of Children: 1  . Years of Education: MD   Occupational History  . Not on file.   Social History Main Topics  . Smoking status: Never Smoker   . Smokeless tobacco: Never Used  . Alcohol Use:  Yes     Comment: occassionally  . Drug Use: No  . Sexual Activity: Not on file   Other Topics Concern  . Not on file   Social History Narrative  . No narrative on file    Past Surgical History  Procedure Laterality Date  . Tonsillectomy  1938  . Cryosurgery prostate  09/2009    prostate cancer, Gleason 7  . Intraoperative transesophageal echocardiogram N/A 01/14/2014    Procedure: INTRAOPERATIVE TRANSESOPHAGEAL ECHOCARDIOGRAM;  Surgeon: Rexene Alberts, MD;  Location: [facility];  Service: Open Heart Surgery;  Laterality: N/A;  . Transcatheter aortic valve replacement, transaortic N/A 01/14/2014    Procedure: TRANSCATHETER AORTIC VALVE REPLACEMENT, TRANSAORTIC;  Surgeon: Rexene Alberts, MD;  Location: [facility];  Service: Open Heart Surgery;  Laterality: N/A;  . Chest tube insertion Left 01/14/2014    Procedure: CHEST TUBE INSERTION;  Surgeon: Rexene Alberts, MD;  Location: [facility];  Service: Open Heart Surgery;  Laterality: Left;    Family History  Problem Relation Age of Onset  . Heart disease Father   . Cancer Father     prostate  . Heart attack Father   . Cancer - Colon Mother   . Thyroid disease Mother   . Stroke Maternal Grandfather   . Hypertension Maternal Grandfather   . Stroke Paternal Grandfather   . Hypertension Paternal Grandfather   . Cancer - Lung Cousin  Allergies  Allergen Reactions  . Other     Beer and Wine- swelling/headache/nausea    No current facility-administered medications on file prior to encounter.   Current Outpatient Prescriptions on File Prior to Encounter  Medication Sig Dispense Refill  . aspirin 81 MG chewable tablet Chew 1 tablet (81 mg total) by mouth daily.      Marland Kitchen docusate sodium 100 MG CAPS Take 100 mg by mouth 2 (two) times daily.  10 capsule  0  . geriatric multivitamins-minerals (ELDERTONIC/GEVRABON) ELIX Take 15 mLs by mouth daily.  150 mL  0  . HYDROmorphone (DILAUDID) 2 MG/ML SOLN injection Inject 1 mL (2 mg total) into the vein  every 3 (three) hours as needed for severe pain.  1 mL  0  . ibuprofen (ADVIL,MOTRIN) 800 MG tablet Take 800 mg by mouth every 8 (eight) hours as needed for pain.      Marland Kitchen lactulose (CHRONULAC) 10 GM/15ML solution Take 30 mLs (20 g total) by mouth 2 (two) times daily as needed for moderate constipation.  240 mL  0  . metoprolol succinate (TOPROL-XL) 50 MG 24 hr tablet Take 50 mg by mouth daily. Take with or immediately following a meal.      . Multiple Vitamin (MULTIVITAMIN WITH MINERALS) TABS Take 1 tablet by mouth daily.      . ondansetron (ZOFRAN) 4 MG tablet Take 1 tablet (4 mg total) by mouth every 6 (six) hours as needed for nausea.  20 tablet  0  . pantoprazole (PROTONIX) 40 MG tablet Take 40 mg by mouth daily.      . polyethylene glycol (MIRALAX / GLYCOLAX) packet Take 17 g by mouth 2 (two) times daily.  14 each  0  . simvastatin (ZOCOR) 20 MG tablet Take 20 mg by mouth at bedtime.       Marland Kitchen warfarin (COUMADIN) 6 MG tablet Take 6 mg by mouth daily.      Marland Kitchen alum & mag hydroxide-simeth (MAALOX/MYLANTA) 200-200-20 MG/5ML suspension Take 30 mLs by mouth every 6 (six) hours as needed for indigestion or heartburn (dyspepsia).  355 mL  0    BP 108/64  Pulse 78  Temp(Src) 97.7 F (36.5 C) (Oral)  Resp 18  Ht 6' 2.02" (1.88 m)  Wt 85.2 kg (187 lb 13.3 oz)  BMI 24.11 kg/m2  SpO2 96%   Patient Vitals for the past 24 hrs:  BP Temp Temp src Pulse Resp SpO2 Weight  04/05/14 0600 - - - - - - 85.2 kg (187 lb 13.3 oz)  04/05/14 0458 108/64 mmHg 97.7 F (36.5 C) Oral 78 18 96 % -  04/04/14 2207 134/53 mmHg 98.2 F (36.8 C) Axillary 80 17 98 % 85.3 kg (188 lb 0.8 oz)  04/04/14 1515 124/70 mmHg 97.4 F (36.3 C) Oral 79 17 97 % -     Intake/Output Summary (Last 24 hours) at 04/05/14 0845 Last data filed at 04/05/14 0157  Gross per 24 hour  Intake    120 ml  Output    200 ml  Net    -80 ml   Lab Results  Component Value Date   INR 2.53* 04/05/2014   INR 2.40* 04/04/2014   INR 2.48*  04/03/2014    Objective: Vital Signs: Blood pressure 108/64, pulse 78, temperature 97.7 F (36.5 C), temperature source Oral, resp. rate 18, height 6' 2.02" (1.88 m), weight 85.2 kg (187 lb 13.3 oz), SpO2 96.00%. No results found. No results found for this basename: WBC,  HGB, HCT, PLT,  in the last 72 hours No results found for this basename: NA, K, CL, CO, GLUCOSE, BUN, CREATININE, CALCIUM,  in the last 72 hours CBG (last 3)  No results found for this basename: GLUCAP,  in the last 72 hours  Wt Readings from Last 3 Encounters:  04/05/14 85.2 kg (187 lb 13.3 oz)  03/24/14 87.5 kg (192 lb 14.4 oz)  03/06/14 87.091 kg (192 lb)    Physical Exam:  Nursing note and vitals reviewed.  Constitutional: He is oriented to person, place, and time. He appears well-developed and well-nourished.  HENT:  Head: Normocephalic and atraumatic.  Eyes: Conjunctivae are normal. Pupils are equal, round, and reactive to light.  Neck: Normal range of motion. Neck supple.  Cardiovascular: Normal rate and regular rhythm. No murmurs, rubs, or gallops  No murmur heard.  Respiratory: Effort normal and breath sounds normal. No respiratory distress. He has no wheezes.  GI: Soft. Bowel sounds are normal.  Neurological: He is alert and oriented to person, place, and time. UES nearly 5/5. RLE 2- HF, 2+ KE and 4/5 ankle. LLE 2+ to 3 HF, 3+ KE and 4+ ankle. No sensory findings. CN exam normal. DTR's 1+. Cognitively appropriate Musc: right hip/groin tender to PROM and AROM.  Skin: Skin is warm and dry.     Assessment/Plan: 1. Functional deficits secondary to metastatic prostate cancer to the pelvis which require 3+ hours per day of interdisciplinary therapy in a comprehensive inpatient rehab setting. SNF placement still pending.  2. Right hip pain/Pain Management: current adjustments/regimen proved effective so far  -continue po dilaudid prn -increased ms contin to 30mg  bid with good results -appropriate  pacing/rest breaks etc  -increased lidoderm patches to 3 daily.  3. A Fib/TAVR 01/2014: Continue coumadin  4. H/O CVA: continue ASA for secondary stroke prevention.  5. NICM/Chronic combined systolic and diastolic CHF: Off diuretics and No ace due to low BP. Low salt diet.     LOS (Days) 9 A FACE TO FACE EVALUATION WAS PERFORMED  Marletta Lor 04/05/2014 8:42 AM

## 2014-04-05 NOTE — Progress Notes (Signed)
Coumadin per pharmacy  - 89 yoM w/ metastatic prostate CA admitted 4/11 w/ R hip pain. Pt on warfarin PTA for AFib. Transferred to [facility] 4/14 for XRT treatments. Warfarin per RX. INR therapeutic 2.53. INR had been in uppper end of therapeutic range, appeared may need lower than pta home dose of 6mg  QD. But steadily decreasing on 3mg  daily. No bleeding noted. Hgb (4/17) low but stable.  GOAL: INR 2-3  PLAN:  1. Coumadin 4 mg x1 2. qAM INR. Monitor for bleeding complications.  3. Coumadin educated on 03/28/14  Albertina Parr, PharmD.  Clinical Pharmacist Pager [phone]

## 2014-04-06 ENCOUNTER — Inpatient Hospital Stay (HOSPITAL_COMMUNITY): Payer: Medicare Other | Admitting: Physical Therapy

## 2014-04-06 LAB — PROTIME-INR
INR: 2.93 — ABNORMAL HIGH (ref 0.00–1.49)
Prothrombin Time: 29.5 seconds — ABNORMAL HIGH (ref 11.6–15.2)

## 2014-04-06 MED ORDER — PROCHLORPERAZINE MALEATE 5 MG PO TABS
5.0000 mg | ORAL_TABLET | Freq: Four times a day (QID) | ORAL | Status: DC
  Administered 2014-04-06 – 2014-04-10 (×14): 5 mg via ORAL
  Filled 2014-04-06 (×22): qty 1

## 2014-04-06 MED ORDER — WARFARIN SODIUM 2 MG PO TABS
2.0000 mg | ORAL_TABLET | Freq: Once | ORAL | Status: AC
  Administered 2014-04-06: 2 mg via ORAL
  Filled 2014-04-06: qty 1

## 2014-04-06 NOTE — Progress Notes (Signed)
Physical Therapy Session Note  Patient Details  Name: Lucas Green MRN: [id number] Date of Birth: 1932/10/28  Today's Date: 04/06/2014 Time: 1134-1200 Time Calculation (min): 26 min  Short Term Goals: Week 1:  PT Short Term Goal 1 (Week 1): Pt will perform supine <> sit on flat bed mod A reporting pain <5/10 PT Short Term Goal 2 (Week 1): Pt will perform transfers bed <> w/c mod A with RW and reporting pain <5/10 PT Short Term Goal 3 (Week 1): Pt will perform w/c mobility x 50' with bilat UE and supervision for endurance PT Short Term Goal 4 (Week 1): Pt will perform gait x 15' with RW and mod A with pain < 5/10 PT Short Term Goal 5 (Week 1): Pt will perform up/down one step with RW and mod A with <5/10 pain  Skilled Therapeutic Interventions/Progress Updates:   Pt LTG downgraded secondary to pt stating that he may D/C to SNF early this coming week.  Pt reporting no pain and minimal nausea.  Pt performed supine > sit EOB with min A with use of bed rail and HOB elevated with assistance needed to maintain anterior weight shift of COG to scoot to EOB to provide foot support on floor.  Pt continues to demonstrate increased posterior pelvic tilt with posterior and L lateral lean with R pelvis elevated to keep weight off of R side.  Seated EOB engaged pt in reaching activity reaching up, forwards and to the R with tactile and verbal cues for anterior pelvic tilt, upright trunk, R pelvic depression and trunk elongation to bring COG more to midline secondary to pt reporting feeling like he is going to lose his balance back and to the L.  Pt fearful of falling forwards.  Pt performed one sit > stand from bed and attempted to engage pt in RLE open chain strengthening and ROM exercises with bilat UE support on RW but pt only able to lift his R heel; pt again very fearful of falling and reporting LOB back and to the L.  Pt returned to sitting on bed and unwilling to stand again or ambulate short distance to  recliner.  Pt reporting significant fear and feeling dizzy.  Discussed with pt negative effects of immobility and time in the bed (poor vascular return, increased weakness and imbalance.)  Pt verbalized understanding but continued to request to lie down.  Pt returned to supine with mod A and set up with all items within reach.   Therapy Documentation Precautions:  Precautions Precautions: Fall Precaution Comments: Notify physician for pulse less than 55 or greater than 120, respiratory rate less than 12 or greater than 25, temperature greater than 100.5 F, urinary output less than 30 mL/hr for four hours, systolic BP less than 90 or greater than 629, diastolic BP less than 60 or greater than 100.  Prostate CA with mets to pelvis; pain; AFIB and CHF; monitor vitals Restrictions Weight Bearing Restrictions: No Other Position/Activity Restrictions: PA states no activity restrictions; exercise to pt pain tolerance; may want to avoid resisted exercises or use of Nustep secondary to pain (pt reports use of bike and Nustep at cardiac rehab were very painful) Pain: Pain Assessment Pain Assessment: No/denies pain Pain Score: 1  Pain Location: Hip Pain Orientation: Right Pain Radiating Towards: back Pain Descriptors / Indicators: Aching Pain Onset: On-going Patients Stated Pain Goal: 1 Pain Intervention(s): Medication (See eMAR)  See FIM for current functional status  Therapy/Group: Individual Therapy  Malachy Mood  04/06/2014, 12:28 PM

## 2014-04-06 NOTE — Progress Notes (Signed)
Coumadin per pharmacy  - 61 yoM w/ metastatic prostate CA admitted 4/11 w/ R hip pain. Pt on warfarin PTA for AFib. Transferred to [facility] 4/14 for XRT treatments. Warfarin per RX. He remains nauseous and not eating much. INR had been in uppper end of therapeutic range, appeared may need lower than pta home dose of 6mg  QD. But steadily decreased on 3mg  daily. No bleeding noted. Hgb (4/17) low but stable. INR therapeutic but up to 2.93 today.   GOAL: INR 2-3  PLAN:  1. Coumadin 2 mg x1 2. qAM INR. Monitor for bleeding complications.  3. Coumadin educated on 03/28/14  Albertina Parr, PharmD.  Clinical Pharmacist Pager [phone]

## 2014-04-06 NOTE — Progress Notes (Signed)
Patient ID: Lucas Green, male   DOB: 1932/03/25, 80 y.o.   MRN: [id number]  Patient ID: Lucas Green, male   DOB: 1932/07/25, 80 y.o.   MRN: [id number]    [place] [facility]ICINE & REHABILITATION     PROGRESS NOTE   04/06/14.  Subjective/Complaints:  80 year old patient admitted to rehabilitation service with physical deconditioning secondary to metastatic prostate cancer with metastatic bone disease.  Main complaint has been right hip pain.  Medical problems include dyslipidemia, aortic stenosis.  He has a history of paroxysmal atrial fibrillation and acute on chronic diastolic heart failure.  He has a pathological pelvic fracture. Had a good night. Main c/o continues to be nausea-requests trial of compazine.  R hip pain improved  Past Medical History  Diagnosis Date  . Hypertension   . GERD (gastroesophageal reflux disease)   . Dyslipidemia   . Aortic stenosis   . Prostate cancer 09/2009  . 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 Hearing loss   . Incontinence of urine   . 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 Degenerative arthritis     osteoarthritis  . Stroke 03/29/13  . Hx of radiation therapy 09/30/13- 10/11/13    right hip/ischium, 3000 cGy 10 sessions  . Esophageal stricture   . Hiatal hernia   . Diverticulosis   . Moderate protein-calorie malnutrition 12/30/2013  . Failure to thrive 12/30/2013  . Pleural effusion, left 12/30/2013  . Severe aortic stenosis   . Atrial fibrillation   . Chronic diastolic congestive heart failure   . Acute on chronic diastolic heart failure   . 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 Marital Status: Married    Spouse Name: N/A    Number of Children: 1  . Years of Education: MD   Occupational History  . Not on file.   Social History Main  Topics  . Smoking status: Never Smoker   . Smokeless tobacco: Never Used  . Alcohol Use: Yes     Comment: occassionally  . Drug Use: No  . Sexual Activity: Not on file   Other Topics Concern  . Not on file   Social History Narrative  . No narrative on file    Past Surgical History  Procedure Laterality Date  . Tonsillectomy  1938  . Cryosurgery prostate  09/2009    prostate cancer, Gleason 7  . Intraoperative transesophageal echocardiogram N/A 01/14/2014    Procedure: INTRAOPERATIVE TRANSESOPHAGEAL ECHOCARDIOGRAM;  Surgeon: Rexene Alberts, MD;  Location: [facility];  Service: Open Heart Surgery;  Laterality: N/A;  . Transcatheter aortic valve replacement, transaortic N/A 01/14/2014    Procedure: TRANSCATHETER AORTIC VALVE REPLACEMENT, TRANSAORTIC;  Surgeon: Rexene Alberts, MD;  Location: [facility];  Service: Open Heart Surgery;  Laterality: N/A;  . Chest tube insertion Left 01/14/2014    Procedure: CHEST TUBE INSERTION;  Surgeon: Rexene Alberts, MD;  Location: [facility];  Service: Open Heart Surgery;  Laterality: Left;    Family History  Problem Relation Age of Onset  . Heart disease Father   . Cancer Father     prostate  . Heart attack Father   . Cancer - Colon Mother   . Thyroid disease Mother   . Stroke Maternal Grandfather   . Hypertension Maternal Grandfather   .  Stroke Paternal Grandfather   . Hypertension Paternal Grandfather   . Cancer - Lung Cousin     Allergies  Allergen Reactions  . Other     Beer and Wine- swelling/headache/nausea    No current facility-administered medications on file prior to encounter.   Current Outpatient Prescriptions on File Prior to Encounter  Medication Sig Dispense Refill  . aspirin 81 MG chewable tablet Chew 1 tablet (81 mg total) by mouth daily.      Marland Kitchen docusate sodium 100 MG CAPS Take 100 mg by mouth 2 (two) times daily.  10 capsule  0  . geriatric multivitamins-minerals (ELDERTONIC/GEVRABON) ELIX Take 15 mLs by mouth daily.  150 mL  0   . HYDROmorphone (DILAUDID) 2 MG/ML SOLN injection Inject 1 mL (2 mg total) into the vein every 3 (three) hours as needed for severe pain.  1 mL  0  . ibuprofen (ADVIL,MOTRIN) 800 MG tablet Take 800 mg by mouth every 8 (eight) hours as needed for pain.      Marland Kitchen lactulose (CHRONULAC) 10 GM/15ML solution Take 30 mLs (20 g total) by mouth 2 (two) times daily as needed for moderate constipation.  240 mL  0  . metoprolol succinate (TOPROL-XL) 50 MG 24 hr tablet Take 50 mg by mouth daily. Take with or immediately following a meal.      . Multiple Vitamin (MULTIVITAMIN WITH MINERALS) TABS Take 1 tablet by mouth daily.      . ondansetron (ZOFRAN) 4 MG tablet Take 1 tablet (4 mg total) by mouth every 6 (six) hours as needed for nausea.  20 tablet  0  . pantoprazole (PROTONIX) 40 MG tablet Take 40 mg by mouth daily.      . polyethylene glycol (MIRALAX / GLYCOLAX) packet Take 17 g by mouth 2 (two) times daily.  14 each  0  . simvastatin (ZOCOR) 20 MG tablet Take 20 mg by mouth at bedtime.       Marland Kitchen warfarin (COUMADIN) 6 MG tablet Take 6 mg by mouth daily.      Marland Kitchen alum & mag hydroxide-simeth (MAALOX/MYLANTA) 200-200-20 MG/5ML suspension Take 30 mLs by mouth every 6 (six) hours as needed for indigestion or heartburn (dyspepsia).  355 mL  0    BP 126/53  Pulse 87  Temp(Src) 98.3 F (36.8 C) (Oral)  Resp 18  Ht 6' 2.02" (1.88 m)  Wt 85.7 kg (188 lb 15 oz)  BMI 24.25 kg/m2  SpO2 97%   Patient Vitals for the past 24 hrs:  BP Temp Temp src Pulse Resp SpO2 Weight  04/06/14 0721 - - - - - - 85.7 kg (188 lb 15 oz)  04/06/14 0519 126/53 mmHg 98.3 F (36.8 C) Oral 87 18 97 % -  04/05/14 2138 135/54 mmHg 98.6 F (37 C) Oral 86 17 97 % -  04/05/14 1500 115/65 mmHg 98.3 F (36.8 C) Oral 82 22 97 % -     Intake/Output Summary (Last 24 hours) at 04/06/14 0819 Last data filed at 04/06/14 0100  Gross per 24 hour  Intake    500 ml  Output    875 ml  Net   -375 ml   Lab Results  Component Value Date   INR  2.93* 04/06/2014   INR 2.53* 04/05/2014   INR 2.40* 04/04/2014    Objective: Vital Signs: Blood pressure 126/53, pulse 87, temperature 98.3 F (36.8 C), temperature source Oral, resp. rate 18, height 6' 2.02" (1.88 m), weight 85.7 kg (188 lb  15 oz), SpO2 97.00%. No results found. No results found for this basename: WBC, HGB, HCT, PLT,  in the last 72 hours No results found for this basename: NA, K, CL, CO, GLUCOSE, BUN, CREATININE, CALCIUM,  in the last 72 hours CBG (last 3)  No results found for this basename: GLUCAP,  in the last 72 hours  Wt Readings from Last 3 Encounters:  04/06/14 85.7 kg (188 lb 15 oz)  03/24/14 87.5 kg (192 lb 14.4 oz)  03/06/14 87.091 kg (192 lb)    Physical Exam:  Nursing note and vitals reviewed.  Constitutional: He is oriented to person, place, and time. He appears well-developed and well-nourished.  HENT:  Head: Normocephalic and atraumatic.  Eyes: Conjunctivae are normal. Pupils are equal, round, and reactive to light.  Neck: Normal range of motion. Neck supple.  Cardiovascular: Normal rate and regular rhythm. No murmurs, rubs, or gallops  No murmur heard.  Respiratory: Effort normal and breath sounds normal. No respiratory distress. He has no wheezes.  GI: Soft. Bowel sounds are normal.  Neurological: He is alert and oriented to person, place, and time. UES nearly 5/5. RLE 2- HF, 2+ KE and 4/5 ankle. LLE 2+ to 3 HF, 3+ KE and 4+ ankle. No sensory findings. CN exam normal. DTR's 1+. Cognitively appropriate Musc: right hip/groin tender to PROM and AROM.  Skin: Skin is warm and dry.     Assessment/Plan: 1. Functional deficits secondary to metastatic prostate cancer to the pelvis which require 3+ hours per day of interdisciplinary therapy in a comprehensive inpatient rehab setting. SNF placement still pending.  2. Right hip pain/Pain Management: current adjustments/regimen proved effective so far  -continue po dilaudid prn -increased ms contin to  30mg  bid with good results -appropriate pacing/rest breaks etc  -increased lidoderm patches to 3 daily.  3. A Fib/TAVR 01/2014: Continue coumadin  4. H/O CVA: continue ASA for secondary stroke prevention.  5. NICM/Chronic combined systolic and diastolic CHF: Off diuretics and No ace due to low BP. Low salt diet. 6. Nausea-trial compazine     LOS (Days) 10 A FACE TO FACE EVALUATION WAS PERFORMED  Marletta Lor 04/06/2014 8:19 AM

## 2014-04-07 ENCOUNTER — Inpatient Hospital Stay (HOSPITAL_COMMUNITY): Payer: Medicare Other

## 2014-04-07 DIAGNOSIS — J189 Pneumonia, unspecified organism: Secondary | ICD-10-CM

## 2014-04-07 DIAGNOSIS — C61 Malignant neoplasm of prostate: Secondary | ICD-10-CM

## 2014-04-07 DIAGNOSIS — C7952 Secondary malignant neoplasm of bone marrow: Secondary | ICD-10-CM

## 2014-04-07 DIAGNOSIS — C7951 Secondary malignant neoplasm of bone: Secondary | ICD-10-CM

## 2014-04-07 DIAGNOSIS — N2881 Hypertrophy of kidney: Secondary | ICD-10-CM

## 2014-04-07 LAB — PROTIME-INR
INR: 3.16 — ABNORMAL HIGH (ref 0.00–1.49)
PROTHROMBIN TIME: 31.3 s — AB (ref 11.6–15.2)

## 2014-04-07 MED ORDER — LOPERAMIDE HCL 2 MG PO CAPS
2.0000 mg | ORAL_CAPSULE | ORAL | Status: DC | PRN
  Administered 2014-04-07 – 2014-04-08 (×4): 2 mg via ORAL
  Filled 2014-04-07 (×4): qty 1

## 2014-04-07 MED ORDER — COUMADIN BOOK
Freq: Once | Status: AC
  Administered 2014-04-07: 10:00:00
  Filled 2014-04-07: qty 1

## 2014-04-07 MED ORDER — WARFARIN SODIUM 2 MG PO TABS
2.0000 mg | ORAL_TABLET | Freq: Once | ORAL | Status: AC
  Administered 2014-04-07: 2 mg via ORAL
  Filled 2014-04-07: qty 1

## 2014-04-07 NOTE — Progress Notes (Signed)
Pt experiencing several episodes of diarrhea/loose stools. Dan,PA called, received telephone order for Imodium 2mg  prn.

## 2014-04-07 NOTE — Progress Notes (Signed)
[facility] PHYSICAL MEDICINE & REHABILITATION     PROGRESS NOTE    Subjective/Complaints: Scheduled compazine has helped nausea. Feeling a lot better. Pain is still under control  A 12 point review of systems has been performed and if not noted above is otherwise negative.   Objective: Vital Signs: Blood pressure 119/68, pulse 88, temperature 98.7 F (37.1 C), temperature source Oral, resp. rate 18, height 6' 2.02" (1.88 m), weight 83.8 kg (184 lb 11.9 oz), SpO2 92.00%. No results found. No results found for this basename: WBC, HGB, HCT, PLT,  in the last 72 hours No results found for this basename: NA, K, CL, CO, GLUCOSE, BUN, CREATININE, CALCIUM,  in the last 72 hours CBG (last 3)  No results found for this basename: GLUCAP,  in the last 72 hours  Wt Readings from Last 3 Encounters:  04/07/14 83.8 kg (184 lb 11.9 oz)  03/24/14 87.5 kg (192 lb 14.4 oz)  03/06/14 87.091 kg (192 lb)    Physical Exam:  Nursing note and vitals reviewed.  Constitutional: He is oriented to person, place, and time. He appears well-developed and well-nourished.  HENT:  Head: Normocephalic and atraumatic.  Eyes: Conjunctivae are normal. Pupils are equal, round, and reactive to light.  Neck: Normal range of motion. Neck supple.  Cardiovascular: Normal rate and regular rhythm. No murmurs, rubs, or gallops  No murmur heard.  Respiratory: Effort normal and breath sounds normal. No respiratory distress. He has no wheezes.  GI: Soft. Bowel sounds are normal.  Neurological: He is alert and oriented to person, place, and time. UES nearly 5/5. RLE 2- HF, 2+ KE and 4/5 ankle. LLE 2+ to 3 HF, 3+ KE and 4+ ankle. No sensory findings. CN exam normal. DTR's 1+. Cognitively appropriate Musc: right hip/groin tender to PROM and AROM.  Skin: Skin is warm and dry.  Psychiatric: His behavior is normal. Thought content normal. His mood appears anxious.    Assessment/Plan: 1. Functional deficits secondary to  metastatic prostate cancer to the pelvis which require 3+ hours per day of interdisciplinary therapy in a comprehensive inpatient rehab setting. Physiatrist is providing close team supervision and 24 hour management of active medical problems listed below. Physiatrist and rehab team continue to assess barriers to discharge/monitor patient progress toward functional and medical goals.  SNF placement still pending.   FIM: FIM - Bathing Bathing Steps Patient Completed: Chest;Right Arm;Left Arm;Abdomen;Front perineal area;Right upper leg;Left upper leg Bathing: 3: Mod-Patient completes 5-7 80f 10 parts or 50-74%  FIM - Upper Body Dressing/Undressing Upper body dressing/undressing steps patient completed: Thread/unthread right sleeve of pullover shirt/dresss;Thread/unthread left sleeve of pullover shirt/dress;Put head through opening of pull over shirt/dress;Pull shirt over trunk Upper body dressing/undressing: 5: Set-up assist to: Obtain clothing/put away FIM - Lower Body Dressing/Undressing Lower body dressing/undressing steps patient completed: Fasten/unfasten pants Lower body dressing/undressing: 0: Wears Interior and spatial designer  FIM - Toileting Toileting steps completed by patient: Performs perineal hygiene Toileting Assistive Devices: Grab bar or rail for support Toileting: 2: Max-Patient completed 1 of 3 steps  FIM - Radio producer Devices: Grab bars;Bedside commode Toilet Transfers: 3-To toilet/BSC: Mod A (lift or lower assist);3-From toilet/BSC: Mod A (lift or lower assist)  FIM - Bed/Chair Transfer Bed/Chair Transfer Assistive Devices: Bed rails;Arm rests;HOB elevated;Walker Bed/Chair Transfer: 4: Supine > Sit: Min A (steadying Pt. > 75%/lift 1 leg);3: Sit > Supine: Mod A (lifting assist/Pt. 50-74%/lift 2 legs);3: Bed > Chair or W/C: Mod A (lift or lower assist);3: Chair  or W/C > Bed: Mod A (lift or lower assist)  FIM - Locomotion:  Wheelchair Distance: Pt initially required total A for w/c to gym x 150'; when cued to begin w/c propulsion with UE back to room pt only able to perform 10' before reporting fatigue; returned to room with total A Locomotion: Wheelchair: 0: Activity did not occur FIM - Locomotion: Ambulation Locomotion: Ambulation Assistive Devices: Administrator Ambulation/Gait Assistance: 3: Mod assist Locomotion: Ambulation: 0: Activity did not occur  Comprehension Comprehension Mode: Auditory Comprehension: 5-Understands complex 90% of the time/Cues < 10% of the time  Expression Expression Mode: Verbal Expression: 5-Expresses complex 90% of the time/cues < 10% of the time  Social Interaction Social Interaction: 6-Interacts appropriately with others with medication or extra time (anti-anxiety, antidepressant).  Problem Solving Problem Solving: 5-Solves basic 90% of the time/requires cueing < 10% of the time  Memory Memory: 6-More than reasonable amt of time  Medical Problem List and Plan:  Metastatic prostate cancer to the pelvis  1. DVT Prophylaxis/Anticoagulation: Pharmaceutical: Coumadin  2. Right hip pain/Pain Management: current adjustments/regimen proved effective so far  -continue po dilaudid prn -increased ms contin to 30mg  bid with good results -appropriate pacing/rest breaks etc   -increased lidoderm patches to 3 daily. 3. Mood: LCSW to follow for evaluation and support.  4. Neuropsych: This patient is capable of making decisions on his own behalf.  5. A Fib/TAVR 01/2014: Continue coumadin  6. H/O CVA: continue ASA for secondary stroke prevention.  7. NICM/Chronic combined systolic and diastolic CHF: Off diuretics and No ace due to low BP. Low salt diet.  -weight down to 84kg 8. Constipation: Continue miralax bid and Senna S. dulc suppository and SSE prn 9. Chronic fatigue with anorexia: supplements/ push po  -will ask RD to follow up  -has not tolerated megace in the past 10.  Persistent nausea: may be XRT related, narcs not helping. Bowels are moving  -zofran  Prn   -compazine scheduled has helped.  LOS (Days) 11 A FACE TO FACE EVALUATION WAS PERFORMED  Meredith Staggers 04/07/2014 8:01 AM

## 2014-04-07 NOTE — Progress Notes (Signed)
Physical Therapy Session Note  Patient Details  Name: Lucas Green MRN: [id number] Date of Birth: 11-04-1932  Today's Date: 04/07/2014 Time: 1345-1400 Time Calculation (min): 15 min  Skilled Therapeutic Interventions/Progress Updates:  1:1. Pt received semi-reclined in bed, stated "I feel much better today, no nausea!" Pt very contradicting throughout tx session in regards to verbalizations of feeling better but then trying to talk way out of participation with verbalizations of fatigue. Mod encouragement for pt to participate as able, however, upon sitting up EOB w/ (S) stating onset of nausea and need to lie back down. Min A for t/f sit>sup and repositioning in bed. Pt then initially agreeable to supine therex, however, again verbalizing level of fatigue and declining further participation in therapy this session. Pt semi-reclined in bed at end of session w/ all needs in reach, bed alarm on.   Therapy Documentation Precautions:  Precautions Precautions: Fall Precaution Comments: Notify physician for pulse less than 55 or greater than 120, respiratory rate less than 12 or greater than 25, temperature greater than 100.5 F, urinary output less than 30 mL/hr for four hours, systolic BP less than 90 or greater than 101, diastolic BP less than 60 or greater than 100.  Prostate CA with mets to pelvis; pain; AFIB and CHF; monitor vitals Restrictions Weight Bearing Restrictions: No Other Position/Activity Restrictions: PA states no activity restrictions; exercise to pt pain tolerance; may want to avoid resisted exercises or use of Nustep secondary to pain (pt reports use of bike and Nustep at cardiac rehab were very painful) General: Amount of Missed PT Time (min): 45 Minutes Missed Time Reason: Patient ill (comment);Patient fatigue  See FIM for current functional status  Therapy/Group: Individual Therapy  Gilmore Laroche 04/07/2014, 2:26 PM

## 2014-04-07 NOTE — Progress Notes (Signed)
INITIAL NUTRITION ASSESSMENT  DOCUMENTATION CODES Per approved criteria  -Severe malnutrition in the context of chronic illness   INTERVENTION: 1.  Supplements; continue Ensure Complete po TID, each supplement provides 350 kcal and 13 grams of protein 2.  General healthful diet; encourage intake of foods and beverages as able.  RD to follow and assess for nutritional adequacy.  3.  Nutrition-related interventions; schedule anti-emetics. Discussed changing schedule of administration based on pt needs and symptom control 4.  Meals/snacks; reviewed menu with pt. Discussed nutrient dense foods for high-calorie, high-protein nutrition as able- especially to improve intake when fatigued.   NUTRITION DIAGNOSIS: Inadequate oral intake related to nausea, fatigue as evidenced by pt report.   Monitor:  1.  Food/Beverage; pt meeting >/=90% estimated needs with tolerance. 2.  Wt/wt change; monitor trends  Reason for Assessment: consult  80 y.o. male  Admitting Dx: deconditioning  ASSESSMENT: Pt admitted with deconditioning s/p palliative radiation therapy at the cancer center.  Pt with ongoing right hip pain. RD met with pt and wife.  Pt reports that fatigue and nausea are limiting his intake.  He feels nausea has improved significantly over the past 2 days since starting compazine.  Pt states that intensive rehab is tiring, and today he was too tired to eat after PT.  He did have a 1 hr break after completing PT prior to lunch service, but was still exhausted.  RD discussed nutrition-related goals with pt and wife.  Also discussed ways to accomplish.  Discussed high calorie nutrition therapy, especially for days when pt's intake is limited due to fatigue.  Pt states that for a few days, his meals were not sent as ordered, however he states that for the past 2 days this has been corrected and he has been getting foods of his preference.  RD discussed with nutrition ambassador on behalf of patient.    Nutrition Focused Physical Exam: Subcutaneous Fat:  Orbital Region: moderate wasting Upper Arm Region: moderate wasting Thoracic and Lumbar Region: mild-moderate wasting  Muscle:  Temple Region: mild wasting Clavicle Bone Region: moderate wasting Clavicle and Acromion Bone Region: mild wasting Scapular Bone Region: moderate wasting Dorsal Hand: severe wasting Patellar Region: WNL Anterior Thigh Region: moderate wasting Posterior Calf Region: mild-moderate wasting  Edema: none present  Pt meets criteria for severe MALNUTRITION in the context of chronic as evidenced by moderate muscle and subcutaneous fat wasting, ongoing poor PO intake not meeting 75% of estimated needs for >1 month, and 8% wt loss in 3 months.  Height: Ht Readings from Last 1 Encounters:  03/27/14 6' 2.02" (1.88 m)    Weight: Wt Readings from Last 1 Encounters:  04/07/14 184 lb 11.9 oz (83.8 kg)    Ideal Body Weight: 86.3 kg  % Ideal Body Weight: 97%  Wt Readings from Last 10 Encounters:  04/07/14 184 lb 11.9 oz (83.8 kg)  03/24/14 192 lb 14.4 oz (87.5 kg)  03/06/14 192 lb (87.091 kg)  02/17/14 180 lb (81.647 kg)  01/20/14 171 lb 4.8 oz (77.701 kg)  01/20/14 171 lb 4.8 oz (77.701 kg)  01/20/14 171 lb 4.8 oz (77.701 kg)  11/19/13 210 lb (95.255 kg)  11/08/13 207 lb (93.895 kg)  10/07/13 218 lb (98.884 kg)    Usual Body Weight: 220 lbs  % Usual Body Weight: 83%  BMI:  Body mass index is 23.71 kg/(m^2).  Estimated Nutritional Needs: Kcal: 2100-2350 Protein: 100-115g Fluid: >2.1 L/day  Skin: intact  Diet Order: General  EDUCATION NEEDS: -Education  needs addressed   Intake/Output Summary (Last 24 hours) at 04/07/14 1350 Last data filed at 04/07/14 1300  Gross per 24 hour  Intake    600 ml  Output    300 ml  Net    300 ml    Last BM: 4/25  Labs:  No results found for this basename: NA, K, CL, CO2, BUN, CREATININE, CALCIUM, MG, PHOS, GLUCOSE,  in the last 168 hours  CBG  (last 3)  No results found for this basename: GLUCAP,  in the last 72 hours  Scheduled Meds: . aspirin  81 mg Oral Daily  . feeding supplement (ENSURE COMPLETE)  237 mL Oral BID BM  . geriatric multivitamins-minerals  15 mL Oral Daily  . lactose free nutrition  237 mL Oral TID WC  . lidocaine  3 patch Transdermal Daily  . metoprolol succinate  50 mg Oral Q breakfast  . morphine  30 mg Oral Q12H  . multivitamin with minerals  1 tablet Oral Daily  . pantoprazole  40 mg Oral Daily  . polyethylene glycol  17 g Oral BID  . prochlorperazine  5 mg Oral QID  . senna-docusate  2 tablet Oral BID  . simvastatin  20 mg Oral q1800  . warfarin  2 mg Oral ONCE-1800  . Warfarin - Pharmacist Dosing Inpatient   Does not apply q1800    Continuous Infusions:   Past Medical History  Diagnosis Date  . Hypertension   . GERD (gastroesophageal reflux disease)   . Dyslipidemia   . Aortic stenosis   . Prostate cancer 09/2009  . 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 Hearing loss   . Incontinence of urine   . 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 Degenerative arthritis     osteoarthritis  . Stroke 03/29/13  . Hx of radiation therapy 09/30/13- 10/11/13    right hip/ischium, 3000 cGy 10 sessions  . Esophageal stricture   . Hiatal hernia   . Diverticulosis   . Moderate protein-calorie malnutrition 12/30/2013  . Failure to thrive 12/30/2013  . Pleural effusion, left 12/30/2013  . Severe aortic stenosis   . Atrial fibrillation   . Chronic diastolic congestive heart failure   . Acute on chronic diastolic heart failure   . 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 Tonsillectomy  1938  . Cryosurgery prostate  09/2009    prostate cancer, Gleason 7  . Intraoperative transesophageal echocardiogram N/A  01/14/2014    Procedure: INTRAOPERATIVE TRANSESOPHAGEAL ECHOCARDIOGRAM;  Surgeon: Rexene Alberts, MD;  Location: [facility];  Service: Open Heart Surgery;  Laterality: N/A;  . Transcatheter aortic valve replacement, transaortic N/A 01/14/2014    Procedure: TRANSCATHETER AORTIC VALVE REPLACEMENT, TRANSAORTIC;  Surgeon: Rexene Alberts, MD;  Location: [facility];  Service: Open Heart Surgery;  Laterality: N/A;  . Chest tube insertion Left 01/14/2014    Procedure: CHEST TUBE INSERTION;  Surgeon: Rexene Alberts, MD;  Location: [facility];  Service: Open Heart Surgery;  Laterality: Left;    Brynda Greathouse, MS RD LDN Clinical Inpatient Dietitian Pager: [phone] Weekend/After hours pager: [phone]

## 2014-04-07 NOTE — Progress Notes (Signed)
Coumadin per pharmacy  - 40 yoM w/ metastatic prostate CA admitted 4/11 w/ R hip pain. Pt on warfarin PTA for AFib. Transferred to [facility] 4/14 for XRT treatments. Warfarin per RX. INR supratherapeutic at 3.16 (effect from 4 mg that he got few days ago). INR had been in uppper end of therapeutic range, appeared may need lower than pta home dose of 6mg  QD. No bleeding noted.   GOAL: INR 2-3  PLAN:  1. Coumadin 2 mg x1 (Patient will need b/w 2 to 3 mg daily of coumadin) 2. qAM INR. Monitor for bleeding complications.  3. Coumadin educated on 03/28/14

## 2014-04-07 NOTE — Progress Notes (Signed)
Occupational Therapy Session Note  Patient Details  Name: Lucas Green MRN: [id number] Date of Birth: [birth year]  Today's Date: 04/07/2014 Time: 1030-1130 Time Calculation (min): 60 min  Short Term Goals: Week 2:  OT Short Term Goal 1 (Week 2): Patient will complete lower body bathing supine or sitting and standing using AE, prn, with min assist OT Short Term Goal 2 (Week 2): Patient will complete toileting with min assist OT Short Term Goal 3 (Week 2): Patient will completed SPT from w/c to [facility] with min assist OT Short Term Goal 4 (Week 2): Patient will complete HEP to improved endurance and strength at bil UE with supervision  Skilled Therapeutic Interventions/Progress Updates: ADL-retraining with focus on improved activity tolerance, bed mobility, sit><stand, functional mobility using RW, transfers (toilet, shower, bed), and dynamic sitting/standing balance. Patient received supine in bed with spouse in room.   Patient was alert and oriented and receptive for treatment stating he planned to use toilet and perform bathing in shower this session.   After mod assist to rise to sitting at edge of bed, patient completed transfer to RW and ambulated to toilet with extra time, steadying assist and verbal cues to monitor gait width and stride.   Patient completed SPT at toilet using grab bar and BSC over toilet as safety frame with OT providing steadying assist while lowering and moderate assist to rise to perform assisted toilet hygiene of BM.   Patient completed transfer to tub bench in walk-in shower with mod assist to lower and verbal cues for technique.   After recovery from BM and mobility of approx 2-3 min, patient required setup assist to bathe and continuous discrete cues to progress through task (pt = 60%).    Following bathing, patient was quite depleted and required mod assist to transfer to w/c before being assisted with dressing (deferred lower body dressing this date, except for diaper).     Patient returned to bed to rest, pleased with his performance and satisfied with level of cleanliness.   Patient's overall performance in ADL was moderate assist X 1.    Therapy Documentation Precautions:  Precautions Precautions: Fall Precaution Comments: Notify physician for pulse less than 55 or greater than 120, respiratory rate less than 12 or greater than 25, temperature greater than 100.5 F, urinary output less than 30 mL/hr for four hours, systolic BP less than 90 or greater than 353, diastolic BP less than 60 or greater than 100.  Prostate CA with mets to pelvis; pain; AFIB and CHF; monitor vitals Restrictions Weight Bearing Restrictions: No Other Position/Activity Restrictions: PA states no activity restrictions; exercise to pt pain tolerance; may want to avoid resisted exercises or use of Nustep secondary to pain (pt reports use of bike and Nustep at cardiac rehab were very painful)  Vital Signs: Therapy Vitals Pulse Rate: 93 BP: 114/70 mmHg  Pain: No/denies pain   ADL: ADL ADL Comments: see FIM  See FIM for current functional status  Therapy/Group: Individual Therapy  Salome Spotted 04/07/2014, 11:30 AM

## 2014-04-08 ENCOUNTER — Inpatient Hospital Stay (HOSPITAL_COMMUNITY): Payer: Medicare Other | Admitting: Physical Therapy

## 2014-04-08 ENCOUNTER — Inpatient Hospital Stay (HOSPITAL_COMMUNITY): Payer: Medicare Other

## 2014-04-08 DIAGNOSIS — N2881 Hypertrophy of kidney: Secondary | ICD-10-CM

## 2014-04-08 DIAGNOSIS — C7952 Secondary malignant neoplasm of bone marrow: Secondary | ICD-10-CM

## 2014-04-08 DIAGNOSIS — C61 Malignant neoplasm of prostate: Secondary | ICD-10-CM

## 2014-04-08 DIAGNOSIS — C7951 Secondary malignant neoplasm of bone: Secondary | ICD-10-CM

## 2014-04-08 DIAGNOSIS — J189 Pneumonia, unspecified organism: Secondary | ICD-10-CM

## 2014-04-08 LAB — BASIC METABOLIC PANEL
BUN: 18 mg/dL (ref 6–23)
CO2: 24 mEq/L (ref 19–32)
Calcium: 8.6 mg/dL (ref 8.4–10.5)
Chloride: 93 mEq/L — ABNORMAL LOW (ref 96–112)
Creatinine, Ser: 0.97 mg/dL (ref 0.50–1.35)
GFR calc Af Amer: 89 mL/min — ABNORMAL LOW (ref >90)
GFR calc non Af Amer: 76 mL/min — ABNORMAL LOW (ref >90)
GLUCOSE: 117 mg/dL — AB (ref 70–99)
POTASSIUM: 4.3 meq/L (ref 3.7–5.3)
SODIUM: 131 meq/L — AB (ref 137–147)

## 2014-04-08 LAB — CBC
HCT: 31.7 % — ABNORMAL LOW (ref 39.0–52.0)
HEMOGLOBIN: 10.4 g/dL — AB (ref 13.0–17.0)
MCH: 30.2 pg (ref 26.0–34.0)
MCHC: 32.8 g/dL (ref 30.0–36.0)
MCV: 92.2 fL (ref 78.0–100.0)
Platelets: 442 10*3/uL — ABNORMAL HIGH (ref 150–400)
RBC: 3.44 MIL/uL — AB (ref 4.22–5.81)
RDW: 13.7 % (ref 11.5–15.5)
WBC: 14 10*3/uL — ABNORMAL HIGH (ref 4.0–10.5)

## 2014-04-08 LAB — PROTIME-INR
INR: 2.96 — ABNORMAL HIGH (ref 0.00–1.49)
PROTHROMBIN TIME: 29.8 s — AB (ref 11.6–15.2)

## 2014-04-08 MED ORDER — WARFARIN SODIUM 3 MG PO TABS
3.0000 mg | ORAL_TABLET | Freq: Once | ORAL | Status: AC
  Administered 2014-04-08: 3 mg via ORAL
  Filled 2014-04-08: qty 1

## 2014-04-08 NOTE — Progress Notes (Signed)
Physical Therapy Note  Patient Details  Name: Lucas Green MRN: [id number] Date of Birth: [birth year] Today's Date: 04/08/2014  Time: 1250-1315 25 minutes  1:1 No c/o pain or nausea.  Pt agreeable to OOB to chair.  Pt sit to stand multiple attempts with min-mod A for anterior wt shift, pt with posterior pelvic tilt, requires min A for standing balance to don pants, pt unable to achieve full trunk extension during standing.  Gait x 5' with RW with min A, cues for safety.  Pt performed seated exercise x 10 B LAQ, AP, heel slides, SLR (with AAROM).  Pt agreeable to attempt to sit in chair x 1 hour.   Danae Orleans Minie Roadcap 04/08/2014, 1:17 PM

## 2014-04-08 NOTE — Progress Notes (Signed)
[facility]ICAL [facility]ICINE [facility] REHABILITATION     PROGRESS NOTE    Subjective/Complaints: Loose stools since yesterday and overnight.  A 12 point review of systems has been performed and if not noted above is otherwise negative.   Objective: Vital Signs: Blood pressure 124/72, pulse 80, temperature 97.1 F (36.2 C), temperature source Oral, resp. rate 18, height 6' 2.02" (1.88 m), weight 82.918 kg (182 lb 12.8 oz), SpO2 90.00%. No results found. No results found for this basename: WBC, HGB, HCT, PLT,  in the last 72 hours No results found for this basename: NA, K, CL, CO, GLUCOSE, BUN, CREATININE, CALCIUM,  in the last 72 hours CBG (last 3)  No results found for this basename: GLUCAP,  in the last 72 hours  Wt Readings from Last 3 Encounters:  04/08/14 82.918 kg (182 lb 12.8 oz)  03/24/14 87.5 kg (192 lb 14.4 oz)  03/06/14 87.091 kg (192 lb)    Physical Exam:  Nursing note and vitals reviewed.  Constitutional: He is oriented to person, place, and time. He appears well-developed and well-nourished.  HENT:  Head: Normocephalic and atraumatic.  Eyes: Conjunctivae are normal. Pupils are equal, round, and reactive to light.  Neck: Normal range of motion. Neck supple.  Cardiovascular: Normal rate and regular rhythm. No murmurs, rubs, or gallops  No murmur heard.  Respiratory: Effort normal and breath sounds normal. No respiratory distress. He has no wheezes.  GI: Soft. Bowel sounds are normal.  Neurological: He is alert and oriented to person, place, and time. UES nearly 5/5. RLE 2- HF, 2+ KE and 4/5 ankle. LLE 2+ to 3 HF, 3+ KE and 4+ ankle. No sensory findings. CN exam normal. DTR's 1+. Cognitively appropriate Musc: right hip/groin tender to PROM and AROM.  Skin: Skin is warm and dry.  Psychiatric: His behavior is normal. Thought content normal. His mood appears anxious.    Assessment/Plan: 1. Functional deficits secondary to metastatic prostate cancer to the pelvis which  require 3+ hours per day of interdisciplinary therapy in a comprehensive inpatient rehab setting. Physiatrist is providing close team supervision and 24 hour management of active medical problems listed below. Physiatrist and rehab team continue to assess barriers to discharge/monitor patient progress toward functional and medical goals.  SNF placement still pending.   FIM: FIM - Bathing Bathing Steps Patient Completed: Chest;Right Arm;Left Arm;Abdomen;Front perineal area;Right upper leg;Left upper leg Bathing: 3: Mod-Patient completes 5-7 40f 10 parts or 50-74%  FIM - Upper Body Dressing/Undressing Upper body dressing/undressing steps patient completed: Thread/unthread right sleeve of pullover shirt/dresss;Thread/unthread left sleeve of pullover shirt/dress;Put head through opening of pull over shirt/dress;Pull shirt over trunk Upper body dressing/undressing: 5: Set-up assist to: Obtain clothing/put away FIM - Lower Body Dressing/Undressing Lower body dressing/undressing steps patient completed: Fasten/unfasten pants Lower body dressing/undressing: 0: Wears Interior and spatial designer  FIM - Toileting Toileting steps completed by patient: Performs perineal hygiene Toileting Assistive Devices: Grab bar or rail for support Toileting: 1: Total-Patient completed zero steps, helper did all 3  FIM - Radio producer Devices: Bedside commode;Walker;Grab bars Toilet Transfers: 4-To toilet/BSC: Min A (steadying Pt. > 75%);4-From toilet/BSC: Min A (steadying Pt. > 75%)  FIM - Bed/Chair Transfer Bed/Chair Transfer Assistive Devices: Bed rails;Walker Bed/Chair Transfer: 5: Supine > Sit: Supervision (verbal cues/safety issues);4: Sit > Supine: Min A (steadying pt. > 75%/lift 1 leg)  FIM - Locomotion: Wheelchair Distance: Pt initially required total A for w/c to gym x 150'; when cued to begin w/c propulsion  with UE back to room pt only able to perform 10' before  reporting fatigue; returned to room with total A Locomotion: Wheelchair: 0: Activity did not occur FIM - Locomotion: Ambulation Locomotion: Ambulation Assistive Devices: Administrator Ambulation/Gait Assistance: 3: Mod assist Locomotion: Ambulation: 0: Activity did not occur  Comprehension Comprehension Mode: Auditory Comprehension: 5-Understands complex 90% of the time/Cues < 10% of the time  Expression Expression Mode: Verbal Expression: 5-Expresses complex 90% of the time/cues < 10% of the time  Social Interaction Social Interaction: 6-Interacts appropriately with others with medication or extra time (anti-anxiety, antidepressant).  Problem Solving Problem Solving: 5-Solves basic 90% of the time/requires cueing < 10% of the time  Memory Memory: 5-Recognizes or recalls 90% of the time/requires cueing < 10% of the time  Medical Problem List and Plan:  Metastatic prostate cancer to the pelvis  1. DVT Prophylaxis/Anticoagulation: Pharmaceutical: Coumadin  2. Right hip pain/Pain Management: current adjustments/regimen proved effective so far  -continue po dilaudid prn -increased ms contin to 30mg  bid with good results -appropriate pacing/rest breaks etc   -increased lidoderm patches to 3 daily. 3. Mood: LCSW to follow for evaluation and support.  4. Neuropsych: This patient is capable of making decisions on his own behalf.  5. A Fib/TAVR 01/2014: Continue coumadin ---recheck labs today 6. H/O CVA: continue ASA for secondary stroke prevention.  7. NICM/Chronic combined systolic and diastolic CHF: Off diuretics and No ace due to low BP. Low salt diet.  -weight down to 84kg 8. Constipation: Continue miralax bid and Senna S. dulc suppository and SSE prn 9. Chronic fatigue with anorexia: supplements/ push po  -will ask RD to follow up  -has not tolerated megace in the past 10. Persistent nausea: improved  -zofran  Prn   -compazine scheduled    -diarrhea probably from  laxatives---stopped/ prn imodium--recheck labs  LOS (Days) 12 A FACE TO FACE EVALUATION WAS PERFORMED  Meredith Staggers 04/08/2014 7:56 AM

## 2014-04-08 NOTE — Patient Care Conference (Signed)
Inpatient RehabilitationTeam Conference and Plan of Care Update Date: 04/08/2014   Time: 2:00 PM    Patient Name: Lucas Green      Medical Record Number: [id number]  Date of Birth: 04/16/1932 Sex: Male         Room/Bed: 4W09C/4W09C-01 Payor Info: Payor: MEDICARE / Plan: MEDICARE PART A AND B / Product Type: *No Product type* /    Admitting Diagnosis: mestatic prostate cancer to pelvis  Admit Date/Time:  03/27/2014  6:55 PM Admission Comments: No comment available   Primary Diagnosis:  <principal problem not specified> Principal Problem: <principal problem not specified>  Patient Active Problem List   Diagnosis Date Noted  . Physical deconditioning 03/27/2014  . Intractable pain 03/22/2014  . Pathologic pelvic fracture 03/22/2014  . S/P TAVR (transcatheter aortic valve replacement) 01/14/2014  . Acute on chronic systolic and diastolic heart failure, NYHA class 4 01/07/2014  . Severe aortic stenosis   . PAF (paroxysmal atrial fibrillation)   . Chronic diastolic congestive heart failure   . Acute on chronic diastolic heart failure   . Aortic valve disorders 01/01/2014  . Pleural effusion, left 12/30/2013  . Hypokalemia 12/30/2013  . CAP (community acquired pneumonia) 12/30/2013  . Nausea and vomiting 12/30/2013  . Dyslipidemia 12/30/2013  . Weakness 12/30/2013  . Failure to thrive 12/30/2013  . Moderate protein-calorie malnutrition 12/30/2013  . Bone metastases 09/24/2013  . Metastasis from malignant tumor of prostate   . Renal insufficiency 04/02/2013  . Hypertension 03/29/2013  . H/O: CVA (cerebrovascular accident) April 2014 03/29/2013  . Prostate cancer with bone mets 09/11/2009    Expected Discharge Date: Expected Discharge Date:  (SNF)  Team Members Present: Physician leading conference: Dr. Alger Simons Social Worker Present: Lennart Pall, LCSW Nurse Present: Elliot Cousin, RN PT Present: Canary Brim, Cecille Rubin, PT OT Present: Salome Spotted,  OT;Patricia Lissa Hoard, OT PPS Coordinator present : Daiva Nakayama, RN, CRRN;Becky Alwyn Ren, PT     Current Status/Progress Goal Weekly Team Focus  Medical   pain better. nausea decreased. had diarrhea  see prior  bowel mgt, pacing    Bowel/Bladder   Continent of bowel and bladder, per report. LBM 04/05/14,   Pt to remain continent of bowel and bladder.   Monitor   Swallow/Nutrition/ Hydration             ADL's   Setup seated UB ADL, Min-Max A LB ADL, min A transfers, Min-Mod A toileting  Overall Min A for ADL  Endurance, dynamic balance (sitting/standing), pain management, AE training, transfers   Mobility   min A  s/min A overall  participation, strengthening   Communication             Safety/Cognition/ Behavioral Observations            Pain   Lidoderm patch to R hip x 2, lowe back x 1. Scheduled MS Contin which pt is refusing at time.  <3  Assess for nonverbal cues of pain   Skin   Blister to R hip  No additional skin breakdown  Assess q shift    Rehab Goals Patient on target to meet rehab goals: Yes *See Care Plan and progress notes for long and short-term goals.  Barriers to Discharge: pain, fatigue    Possible Resolutions to Barriers:  placement    Discharge Planning/Teaching Needs:  plan changed to SNF      Team Discussion:  Still with limited participation and endurance.  Focusing on sit to stands.  Nausea somewhat  improved and now with incontinence.  Awaiting SNF bed  Revisions to Treatment Plan:  None   Continued Need for Acute Rehabilitation Level of Care: The patient requires daily medical management by a physician with specialized training in physical medicine and rehabilitation for the following conditions: Daily direction of a multidisciplinary physical rehabilitation program to ensure safe treatment while eliciting the highest outcome that is of practical value to the patient.: Yes Daily medical management of patient stability for increased activity during  participation in an intensive rehabilitation regime.: Yes Daily analysis of laboratory values and/or radiology reports with any subsequent need for medication adjustment of medical intervention for : Neurological problems;Post surgical problems;Other  Lennart Pall 04/08/2014, 2:32 PM

## 2014-04-08 NOTE — Progress Notes (Signed)
Occupational Therapy Session Note  Patient Details  Name: Lucas Green MRN: [id number] Date of Birth: [birth year]  Today's Date: 04/08/2014 Time: 0730-0825 Time Calculation (min): 55 min  Short Term Goals: Week 2:  OT Short Term Goal 1 (Week 2): Patient will complete lower body bathing supine or sitting and standing using AE, prn, with min assist OT Short Term Goal 2 (Week 2): Patient will complete toileting with min assist OT Short Term Goal 3 (Week 2): Patient will completed SPT from w/c to [facility] with min assist OT Short Term Goal 4 (Week 2): Patient will complete HEP to improved endurance and strength at bil UE with supervision  Skilled Therapeutic Interventions/Progress Updates: ADL-retraining with focus on improved activity tolerance, bed mobility, transfers, sit><stand, dynamic standing balance, and improved lower body bathing/dressing skills.   Patient received supine in bed, alert and oriented, receptive for ADL session given extra time to progress through tasks.   Patient rose from supine to sitting at edge of bed with setup assist for hospital bed and w/c.   Patient demo'd improved static sitting balance this date w/o evidence of posterior pelvic tilt while sitting.   Patient performed SPT from edge of bed to w/c with only steadying assist and was escorted to sink for ADL session, sitting at standing as feasible.  Patient progressed through upper body bathing/dressing with only setup assist and then stood at sink from w/c to wash peri-area.  Patient was unable to maintain standing balance to wash buttocks or extend reach to wash lower legs and feet while seated.   Patient completed sit><stand 3 times unassisted during session but fatigues after each stand, tolerating approx 30 seconds of standing before requesting to return to sitting.  After 45 minutes total time performing ADL, with numerous rest breaks, patient exhausts his capacity to participate in ADL and seeks extensive assistance  with remaining tasks or defers task, ex: donning pants or completing grooming.   Patient is unable to complete all tasks during 60 minute ADL session but given extra time is overall min-mod A for ADL, most challenged by toilet hygiene and cleansing buttocks.     Therapy Documentation Precautions:  Precautions Precautions: Fall Precaution Comments: Notify physician for pulse less than 55 or greater than 120, respiratory rate less than 12 or greater than 25, temperature greater than 100.5 F, urinary output less than 30 mL/hr for four hours, systolic BP less than 90 or greater than 409, diastolic BP less than 60 or greater than 100.  Prostate CA with mets to pelvis; pain; AFIB and CHF; monitor vitals Restrictions Weight Bearing Restrictions: No Other Position/Activity Restrictions: PA states no activity restrictions; exercise to pt pain tolerance; may want to avoid resisted exercises or use of Nustep secondary to pain (pt reports use of bike and Nustep at cardiac rehab were very painful)  Vital Signs: Therapy Vitals Temp: 97.1 F (36.2 C) Temp src: Oral Pulse Rate: 80 Resp: 18 BP: 124/72 mmHg Patient Position, if appropriate: Lying Oxygen Therapy SpO2: 90 % O2 Device: None (Room air)  Pain: Pain Assessment Pain Assessment: No/denies pain  ADL: ADL ADL Comments: see FIM  See FIM for current functional status  Therapy/Group: Individual Therapy  Salome Spotted 04/08/2014, 8:29 AM

## 2014-04-08 NOTE — Progress Notes (Signed)
PMR Admission Coordinator Pre-Admission Assessment  Patient: Lucas Green is an 80 y.o., male  MRN: [id number]  DOB: [birth year]  Height: 6\' 2"  (188 cm)  Weight: 87.5 kg (192 lb 14.4 oz)  Insurance Information  HMO: No PPO: PCP: IPA: 80/20: OTHER:  PRIMARY: Medicare A/B Policy#: [id number] Subscriber: Elson Areas  CM Name: Phone#: Fax#:  Pre-Cert#: Employer: Retired  Benefits: Phone #: Name: Checked in [place]. Date: A=03/13/1999 B=02/09/02 Deduct: $1260 Out of Pocket Max: none Life Max: unlimited  CIR: 100% SNF: 100 days  Outpatient: 80% Co-Pay: 20%  Home Health: 100% Co-Pay: none  DME: 80% Co-Pay: 20%  Providers: patient's choice   SECONDARY: USSA Policy#: [id number] Subscriber: Elson Areas  CM Name: Phone#: Fax#:  Pre-Cert#: Employer: Retired  Benefits: Phone #: [phone] Name:  Eff. Date: Deduct: Out of Pocket Max: Life Max:  CIR: SNF:  Outpatient: Co-Pay:  Home Health: Co-Pay:  DME: Co-Pay:  Emergency Contact Information  Contact Information    Name  Relation  Home  Work  Mobile    Craton,[name]  Spouse  [phone]  [phone]  [phone]      Current Medical History  Patient Admitting Diagnosis: Metastatic prostate cancer to the pelvis  History of Present Illness: A 80 y.o. male with a history of Metastatic Prostate Cancer, recent TVAR due to sever AVS, PAF- coumadin Rx, Chronic mixed CHF, prior CVA who presents to the ED on 03/21/14 with reports of 2-3 week history of progressive right hip pain with inability to stand on RLE as well as intractable Right hip pain X 24 hours. X-rays of right Hip revealed progressive destructive bone changes of right anterior iliac spine with pathologic fractures involving the right superior and inferior pubic rami. He was started on narcotics for pain management and Radiation Onc consulted for input. He was evaluated by Dr. Lisbeth Renshaw and XRT recommended for pain management--last treatment today. He continues to have  poor activity tolerance due to severe pain. CIR recommended by rehab team and patient to be admitted today.  Past Medical History  Past Medical History   Diagnosis  Date   .  Hypertension    .  GERD (gastroesophageal reflux disease)    .  Dyslipidemia    .  Aortic stenosis    .  Prostate cancer  09/2009   .  Metastasis from malignant tumor of prostate      right iliac   .  Hearing loss    .  Incontinence of urine    .  Male impotence    .  Degenerative arthritis      osteoarthritis   .  Stroke  03/29/13   .  Hx of radiation therapy  09/30/13- 10/11/13     right hip/ischium, 3000 cGy 10 sessions   .  Esophageal stricture    .  Hiatal hernia    .  Diverticulosis    .  Moderate protein-calorie malnutrition  12/30/2013   .  Failure to thrive  12/30/2013   .  Pleural effusion, left  12/30/2013   .  Severe aortic stenosis    .  Atrial fibrillation    .  Chronic diastolic congestive heart failure    .  Acute on chronic diastolic heart failure    .  S/P TAVR (transcatheter aortic valve replacement)  01/14/2014     26 mm Edwards Sapien XT transcatheter heart valve placed via direct aortic approach using right anterior mini-thoracotomy    Family History  family  history includes Cancer in his father; Cancer - Colon in his mother; Cancer - Lung in his cousin; Heart attack in his father; Heart disease in his father; Hypertension in his maternal grandfather and paternal grandfather; Stroke in his maternal grandfather and paternal grandfather; Thyroid disease in his mother.  Prior Rehab/Hospitalizations: Came to CIR 4/14 after CVA. Went to [facility] after valve replacement for 4 weeks 02/15. Was receiving outpatient therapy 3X a week most recently.  Current Medications  Current facility-administered medications:0.9 % sodium chloride infusion, , Intravenous, Continuous, Theressa Millard, MD; acetaminophen (TYLENOL) suppository 650 mg, 650 mg, Rectal, Q6H PRN, Theressa Millard, MD; acetaminophen  (TYLENOL) tablet 650 mg, 650 mg, Oral, Q6H PRN, Theressa Millard, MD, 650 mg at 03/23/14 4315  alum & mag hydroxide-simeth (MAALOX/MYLANTA) 200-200-20 MG/5ML suspension 30 mL, 30 mL, Oral, Q6H PRN, Theressa Millard, MD; aspirin chewable tablet 81 mg, 81 mg, Oral, Daily, Theressa Millard, MD, 81 mg at 03/27/14 0949; docusate sodium (COLACE) capsule 100 mg, 100 mg, Oral, BID, Kelvin Cellar, MD, 100 mg at 03/27/14 4008  geriatric multivitamins-minerals (ELDERTONIC/GEVRABON) elixir ELIX 15 mL, 15 mL, Oral, Daily, Theressa Millard, MD, 15 mL at 03/27/14 0948; HYDROmorphone (DILAUDID) injection 2 mg, 2 mg, Intravenous, Q3H PRN, Robbie Lis, MD; lactulose (CHRONULAC) 10 GM/15ML solution 20 g, 20 g, Oral, BID PRN, Kelvin Cellar, MD; metoCLOPramide (REGLAN) injection 5 mg, 5 mg, Intravenous, Q8H PRN, Kelvin Cellar, MD, 5 mg at 03/27/14 0651  metoprolol succinate (TOPROL-XL) 24 hr tablet 50 mg, 50 mg, Oral, Q breakfast, Theressa Millard, MD, 50 mg at 03/27/14 6761; multivitamin with minerals tablet 1 tablet, 1 tablet, Oral, Daily, Theressa Millard, MD, 1 tablet at 03/27/14 0948; ondansetron (ZOFRAN) injection 4 mg, 4 mg, Intravenous, Q6H PRN, Theressa Millard, MD, 4 mg at 03/23/14 0019  ondansetron (ZOFRAN) tablet 4 mg, 4 mg, Oral, Q6H PRN, Theressa Millard, MD, 4 mg at 03/24/14 1819; oxyCODONE (Oxy IR/ROXICODONE) immediate release tablet 10 mg, 10 mg, Oral, Q4H PRN, Kelvin Cellar, MD, 10 mg at 03/27/14 1329; pantoprazole (PROTONIX) EC tablet 40 mg, 40 mg, Oral, Daily, Theressa Millard, MD, 40 mg at 03/27/14 0954  polyethylene glycol (MIRALAX / GLYCOLAX) packet 17 g, 17 g, Oral, BID, Robbie Lis, MD, 17 g at 03/27/14 0948; simvastatin (ZOCOR) tablet 20 mg, 20 mg, Oral, Daily, Theressa Millard, MD, 20 mg at 03/27/14 0948; warfarin (COUMADIN) tablet 6 mg, 6 mg, Oral, q1800, Theressa Millard, MD, 6 mg at 03/26/14 1833; Warfarin - Pharmacist Dosing Inpatient, , Does not apply, q1800,  Kelvin Cellar, MD  Patients Current Diet: General  Precautions / Restrictions  Precautions  Precautions: Fall  Restrictions  Weight Bearing Restrictions: No (Activity OOB as tolerated per evaluating PT)  Prior Activity Level  Community (5-7x/wk): Goes out 3-4 X a week.  Home Assistive Devices / Equipment  Home Assistive Devices/Equipment: Gilford Rile (specify type)  Home Equipment: Walker - 2 wheels;Cane - single point;Shower seat  Prior Functional Level  Prior Function  Level of Independence: Independent with assistive device(s)  Comments: using cane and RW for ambulation  Current Functional Level  Cognition  Overall Cognitive Status: Within Functional Limits for tasks assessed   Extremity Assessment  (includes Sensation/Coordination)     ADLs  Anticipate ADL needs. Evaluation pending.   Mobility  Overal bed mobility: Needs Assistance  Bed Mobility: Supine to Sit  Supine to sit: Min assist;HOB elevated  General bed mobility comments: verbal cues for safety  and sequence; used therapist hand to bring trunk upright, repositioned hips with bed pad; required significanlty increased time to complete and rest breaks between small bouts of movement due to pain   Transfers  Overall transfer level: Needs assistance  Equipment used: Rolling walker (2 wheeled)  Transfers: Sit to/from Stand  Sit to Stand: Min assist  Stand pivot transfers: Min guard  General transfer comment: verbal cues for safety and sequence; cues for weight shifting to allow easier standing and reduced weight bearing to R LE according to pain tolerance; poor eccentric control upon descent   Ambulation / Gait / Stairs / Wheelchair Mobility  Ambulation/Gait  Ambulation/Gait assistance: Min guard  Ambulation Distance (Feet): 2 Feet (pivot steps bed to chair)  Assistive device: Rolling walker (2 wheeled)  Gait Pattern/deviations: Decreased stride length;Shuffle;Antalgic  General Gait Details: attempted a few shuffled steps  (step length <6 in) with walker to get to recliner; pt declined to ambulate even with chair available behind him for rest breaks; reported pain was too intense (5/10)   Posture / Balance  Dynamic Sitting Balance  Sitting balance - Comments: pt had difficulty positioning self for balance; assisted reposition hips and pt was able to maintain balance with B UE support   Special needs/care consideration  BiPAP/CPAP No  CPM No  Continuous Drip IV No  Dialysis No  Life Vest No  Oxygen No  Special Bed No  Trach Size No  Wound Vac (area) No  Skin Reddened blister area right hip  Bowel mgmt: Last BM 03/24/14  Bladder mgmt: Uses urinal, incontinence at times. Has been wearing depends since CVA.  Diabetic mgmt No   Previous Home Environment  Living Arrangements: Spouse/significant other  Available Help at Discharge: Family (wife works M-F 8 hour days)  Type of Home: House  Home Layout: Multi-level;Able to live on main level with bedroom/bathroom  Home Access: Stairs to enter  Entrance Stairs-Number of Steps: 1  [place]: No  Discharge Living Setting  Plans for Discharge Living Setting: Patient's home;House;Lives with (comment) (Lives with wife, a dog and a cat.)  Type of Home at Discharge: House  Discharge Home Layout: Multi-level;Able to live on main level with bedroom/bathroom  Alternate Level Stairs-Number of Steps: Flight  Discharge Home Access: Stairs to enter  Entrance Stairs-Number of Steps: 1 step in from garage to the kitchen  Does the patient have any problems obtaining your medications?: No  Social/Family/Support Systems  Patient Roles: Spouse;Parent (Has a wife and a son.)  Contact Information: Hal Bowersock - wife  Anticipated Caregiver: Rosemarie Ax and self  Anticipated Caregiver's Contact Information: Rosemarie Ax - wife (h) [phone] (c) [phone]  Ability/Limitations of Caregiver: Wife works FT for the [employer], but they are flexible with her schedule.   Caregiver Availability: Intermittent  Discharge Plan Discussed with Primary Caregiver: Yes  Is Caregiver In Agreement with Plan?: Yes  Does Caregiver/Family have Issues with Lodging/Transportation while Pt is in Rehab?: No  Goals/Additional Needs  Patient/Family Goal for Rehab: PT/OT mod I and S goals, no ST needs  Expected length of stay: 7 days  Cultural Considerations: Episcopalean  Dietary Needs: Regular, thin liquids  Equipment Needs: TBD  Pt/Family Agrees to Admission and willing to participate: Yes  Program Orientation Provided & Reviewed with Pt/Caregiver Including Roles & Responsibilities: Yes  Decrease burden of Care through IP rehab admission: N/A  Possible need for SNF placement upon discharge: Not anticipated  Patient Condition: This patient's medical and functional status has changed since  the consult dated: 03/25/14 in which the Rehabilitation Physician determined and documented that the patient's condition is appropriate for intensive rehabilitative care in an inpatient rehabilitation facility. See "History of Present Illness" (above) for medical update. Functional changes are: Transfers min to min guard assist. Patient's medical and functional status update has been discussed with the Rehabilitation physician and patient remains appropriate for inpatient rehabilitation. Will admit to inpatient rehab today.  Preadmission Screen Completed By: Retta Diones, 03/27/2014 2:19 PM  ______________________________________________________________________  Discussed status with Dr. Letta Pate on 03/27/14 at 1447 and received telephone approval for admission today.  Admission Coordinator: Retta Diones, time1447/Date04/16/15  Cosigned by: Charlett Blake, MD [03/27/2014 2:51 PM]

## 2014-04-08 NOTE — Progress Notes (Signed)
Coumadin per pharmacy  - 55 yoM w/ metastatic prostate CA admitted 4/11 w/ R hip pain. Pt on warfarin PTA for AFib. Transferred to [facility] 4/14 for XRT treatments. Warfarin per RX. INR 2.96 today, back within therapeutic range after being slightly above goal yesterday at 3.16 (effect from 4 mg that he got few days ago). INR had been in uppper end of therapeutic range, appeared may need lower than pta home dose of 6mg  QD. No bleeding noted.   GOAL: INR 2-3  PLAN:  1. Coumadin 3 mg x1 (Patient will need b/w 2 to 3 mg daily of coumadin) 2. qAM INR. Monitor for bleeding complications.  3. Coumadin educated on 03/28/14  Nicole Cella, [facility] Clinical Pharmacist Pager: [phone] 04/08/2014, 11:54 AM

## 2014-04-09 ENCOUNTER — Inpatient Hospital Stay (HOSPITAL_COMMUNITY): Payer: Medicare Other

## 2014-04-09 DIAGNOSIS — N2881 Hypertrophy of kidney: Secondary | ICD-10-CM

## 2014-04-09 DIAGNOSIS — C61 Malignant neoplasm of prostate: Secondary | ICD-10-CM

## 2014-04-09 DIAGNOSIS — J189 Pneumonia, unspecified organism: Secondary | ICD-10-CM

## 2014-04-09 DIAGNOSIS — C7951 Secondary malignant neoplasm of bone: Secondary | ICD-10-CM

## 2014-04-09 DIAGNOSIS — C7952 Secondary malignant neoplasm of bone marrow: Secondary | ICD-10-CM

## 2014-04-09 LAB — URINE MICROSCOPIC-ADD ON

## 2014-04-09 LAB — URINALYSIS, ROUTINE W REFLEX MICROSCOPIC
Bilirubin Urine: NEGATIVE
GLUCOSE, UA: NEGATIVE mg/dL
Hgb urine dipstick: NEGATIVE
KETONES UR: NEGATIVE mg/dL
Nitrite: NEGATIVE
PH: 6 (ref 5.0–8.0)
Protein, ur: NEGATIVE mg/dL
Specific Gravity, Urine: 1.018 (ref 1.005–1.030)
Urobilinogen, UA: 0.2 mg/dL (ref 0.0–1.0)

## 2014-04-09 LAB — PROTIME-INR
INR: 3.02 — AB (ref 0.00–1.49)
Prothrombin Time: 30.2 seconds — ABNORMAL HIGH (ref 11.6–15.2)

## 2014-04-09 MED ORDER — WARFARIN SODIUM 2 MG PO TABS
2.0000 mg | ORAL_TABLET | Freq: Once | ORAL | Status: AC
  Administered 2014-04-09: 2 mg via ORAL
  Filled 2014-04-09: qty 1

## 2014-04-09 MED ORDER — SACCHAROMYCES BOULARDII 250 MG PO CAPS
250.0000 mg | ORAL_CAPSULE | Freq: Two times a day (BID) | ORAL | Status: DC
  Administered 2014-04-09 – 2014-04-10 (×3): 250 mg via ORAL
  Filled 2014-04-09 (×5): qty 1

## 2014-04-09 NOTE — Discharge Summary (Signed)
NAMEJACQUELINE, DELAPENA NO.:  [id number]  MEDICAL RECORD NO.:  [id number]  LOCATION:  4W09C                        FACILITY:  [facility]  PHYSICIAN:  Meredith Staggers, M.D.DATE OF BIRTH:  01-05-1932  DATE OF ADMISSION:  03/28/2014 DATE OF DISCHARGE:  04/10/2014                              DISCHARGE SUMMARY   DISCHARGE DIAGNOSES: 1. Metastatic prostate cancer to pelvis. 2. History of Aortic stenosis s/p TVAR. 3. Constipation. 4. Chronic fatigue. 5. Persistent nausea.  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  X-rays done showed progressive destructive bone changes of right iliac spine with pathological fracture of right superior and inferior pubic rami.  He was started on narcotics for pain management and medications being adjusted due to nausea as well as pain control issues. He  has been treated with XRT per input from Dr. Lisbeth Renshaw.  He continues to be limited by pain, nausea as well as problems with activity tolerance.  CIR was recommended by rehab team.  PAST MEDICAL HISTORY:  Hypertension, GERD, aortic stenosis, status post AVR, history of prostate cancer with metastases, hearing loss, history of XRT to right hip and ischium October 2015, hiatal hernia, history of esophageal stricture, failure to thrive, AFib, and chronic diastolic CHF.  ALLERGIES:  No known drug allergies.  SOCIAL HISTORY:  The patient is married. He was independent with walker prior to admission. He does not use tobacco. He drinks alcohol occasionally.  RECENT LABORATORIES:  Check of lytes from April 28 revealed sodium 131, potassium 4.3, chloride 93, CO2 of 24, BUN 18, creatinine 0.97, and glucose 117.  CBC reveals hemoglobin 10.4, hematocrit  31.7, white count 14.0, and platelets 442.  Most recent PT/INR 30.2 and 3.02.  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  Past admission, physiatrist, rehab, RN, and therapy team have worked together to provide customized collaborative interdisciplinary care.  Rehab RN has worked with patient on bowel and bladder program as well as med administration to help with pain management. He has had complaints of severe constipation, and bowel program was augmented to help with symptoms.  He has also continued to have problems with nausea as well as pain management. Zofran was ineffective in controlling his symptoms.  However, Compazine was scheduled on q.i.d. basis and has helped with GI symptoms.  Pain medicines have been slowly titrated upwards to help with more consistent pain management.  He has required pacing of therapy sessions with multiple rest breaks.  Dr. Norton Pastel, neuropsychologist, was consulted for input, and we felt that patient denied any adjustment issues or depressed mood.  Attention and motivation were good.  The patient did endorse suffering from great deal of pain that was affecting his ability to participate in therapy consistently.   His pain control has improved on current regimen.  He has developed loose  stools, therefore laxatives were discontinued.  Follow up CBC shows leucocytosis and UA on 04/09/14 was negative. UCS currently pending. The patient has been afebrile and no complaints of dysuria reported.  Blood pressures have been well controlled.  Weights have been monitored and has been down to 81.5 kg.  He has not tolerated Megace in the past  and RD was consulted for input on food choices. His intake has been variable. The patient was started on nutritional supplements due to his poor p.o. intake.  The patient is limited by fatigue as well as  malaise.  He currently requires min assist for transfers, mod assist for ambulating 10 feet. He requires total assist to propel his wheelchair.  He is able to perform bathing task with mod assist.  He requires setup assist for upper body dressing, max assist for lower body dressing.  His goals were downgraded to max assist due to poor endurance.  The patient has elected on continuing further therapies at SNF.  Bed is available at [facility] on April 10, 2014 and he is discharged to this facility for further therapies.   DISCHARGE MEDICATIONS: 1. Ensure or boost supplements 3-5 times a day. 2. Aspirin 81 mg p.o. per day. 3. Eldertonic 15 mL p.o. per day. 4. Lidocaine patches-apply 3 patches to pelvis/back area at 8 a.m. And remove at 8 p.m. daily. 5. Toprol-XL 50 mg p.o. per day. 6. MS Contin 30 mg p.o. q.12 hours. 7. Multivitamin 1 p.o. per day. 8. Protonix 40 mg p.o. per day. 9. Compazine 5 mg p.o. q.i.d. 10.Florastor 250 mg p.o. b.i.d. 11.Zocor 20 mg p.o. per day. 12.Coumadin 1 mg this pm. 13.Tylenol 650 mg p.o. q.4 hours p.r.n. pain. 14.Dilaudid 2-4 mg p.o. q.8 hours p.r.n. severe pain. 15.Robaxin 500 mg p.o. q.6 hours p.r.n. spasms. 16.Trazodone 50 mg p.o. at bedtime p.r.n. insomnia.  DIET:  Regular.  ACTIVITY:  As tolerated with assistance.  SPECIAL INSTRUCTIONS:  Recheck protime every 2-3 days and adjust coumadin for INR goal of 2-3. Will likely need 2 mg daily.   FOLLOWUP:  The patient to follow up with MD at [facility] for medical issues. Follow up with Dr. Carolan Clines in the next 2-3 weeks.  Follow up with Dr. Zola Button in few weeks for further input on chemo regimen. Follow up with Dr. Naaman Plummer as needed.     Reesa Chew, P.A.   ______________________________ Meredith Staggers, M.D.    PL/MEDQ  D:  04/09/2014  T:  04/09/2014  Job:  [id number]  cc:   Pierre Bali I. Gaynelle Arabian, M.D. Wyatt Portela, M.D.

## 2014-04-09 NOTE — Progress Notes (Addendum)
[facility]     PROGRESS NOTE    Subjective/Complaints: Still with loose stools.  A 12 point review of systems has been performed and if not noted above is otherwise negative.   Objective: Vital Signs: Blood pressure 128/68, pulse 77, temperature 98.2 F (36.8 C), temperature source Oral, resp. rate 18, height 6' 2.02" (1.88 m), weight 83 kg (182 lb 15.7 oz), SpO2 97.00%. No results found.  Recent Labs  04/08/14 1045  WBC 14.0*  HGB 10.4*  HCT 31.7*  PLT 442*    Recent Labs  04/08/14 1045  NA 131*  K 4.3  CL 93*  GLUCOSE 117*  BUN 18  CREATININE 0.97  CALCIUM 8.6   CBG (last 3)  No results found for this basename: GLUCAP,  in the last 72 hours  Wt Readings from Last 3 Encounters:  04/09/14 83 kg (182 lb 15.7 oz)  03/24/14 87.5 kg (192 lb 14.4 oz)  03/06/14 87.091 kg (192 lb)    Physical Exam:  Nursing note and vitals reviewed.  Constitutional: He is oriented to person, place, and time. He appears well-developed and well-nourished.  HENT:  Head: Normocephalic and atraumatic.  Eyes: Conjunctivae are normal. Pupils are equal, round, and reactive to light.  Neck: Normal range of motion. Neck supple.  Cardiovascular: Normal rate and regular rhythm. No murmurs, rubs, or gallops  No murmur heard.  Respiratory: Effort normal and breath sounds normal. No respiratory distress. He has no wheezes.  GI: Soft. Bowel sounds are normal.  Neurological: He is alert and oriented to person, place, and time. UES nearly 5/5. RLE 2- HF, 2+ KE and 4/5 ankle. LLE 2+ to 3 HF, 3+ KE and 4+ ankle. No sensory findings. CN exam normal. DTR's 1+. Cognitively appropriate Musc: right hip/groin tender to PROM and AROM.  Skin: Skin is warm and dry.  Psychiatric: His behavior is normal. Thought content normal. His mood appears anxious.    Assessment/Plan: 1. Functional deficits secondary to metastatic prostate cancer to the pelvis which require 3+ hours  per day of interdisciplinary therapy in a comprehensive inpatient rehab setting. Physiatrist is providing close team supervision and 24 hour management of active medical problems listed below. Physiatrist and rehab team continue to assess barriers to discharge/monitor patient progress toward functional and medical goals.  SNF placement still pending.   FIM: FIM - Bathing Bathing Steps Patient Completed: Chest;Right Arm;Left Arm;Abdomen;Front perineal area;Right upper leg;Left upper leg Bathing: 3: Mod-Patient completes 5-7 38f 10 parts or 50-74%  FIM - Upper Body Dressing/Undressing Upper body dressing/undressing steps patient completed: Thread/unthread right sleeve of pullover shirt/dresss;Thread/unthread left sleeve of pullover shirt/dress;Put head through opening of pull over shirt/dress;Pull shirt over trunk Upper body dressing/undressing: 5: Set-up assist to: Obtain clothing/put away FIM - Lower Body Dressing/Undressing Lower body dressing/undressing steps patient completed: Thread/unthread right underwear leg;Thread/unthread left underwear leg Lower body dressing/undressing: 2: Max-Patient completed 25-49% of tasks  FIM - Toileting Toileting steps completed by patient: Performs perineal hygiene Toileting Assistive Devices: Grab bar or rail for support Toileting: 1: Total-Patient completed zero steps, helper did all 3  FIM - Radio producer Devices: Bedside commode;Walker;Grab bars Toilet Transfers: 0-Activity did not occur  FIM - Control and instrumentation engineer Devices: HOB elevated;Bed rails;Arm rests Bed/Chair Transfer: 5: Supine > Sit: Supervision (verbal cues/safety issues);4: Bed > Chair or W/C: Min A (steadying Pt. > 75%);4: Chair or W/C > Bed: Min A (steadying Pt. > 75%)  FIM - Locomotion:  Wheelchair Distance: Pt initially required total A for w/c to gym x 150'; when cued to begin w/c propulsion with UE back to room pt only able  to perform 10' before reporting fatigue; returned to room with total A Locomotion: Wheelchair: 0: Activity did not occur FIM - Locomotion: Ambulation Locomotion: Ambulation Assistive Devices: Administrator Ambulation/Gait Assistance: 3: Mod assist Locomotion: Ambulation: 0: Activity did not occur  Comprehension Comprehension Mode: Auditory Comprehension: 6-Follows complex conversation/direction: With extra time/assistive device  Expression Expression Mode: Verbal Expression: 5-Expresses complex 90% of the time/cues < 10% of the time  Social Interaction Social Interaction: 6-Interacts appropriately with others with medication or extra time (anti-anxiety, antidepressant).  Problem Solving Problem Solving: 5-Solves basic problems: With no assist  Memory Memory: 5-Requires cues to use assistive device  Medical Problem List and Plan:  Metastatic prostate cancer to the pelvis  1. DVT Prophylaxis/Anticoagulation: Pharmaceutical: Coumadin  2. Right hip pain/Pain Management: current adjustments/regimen proved effective so far  -continue po dilaudid prn -increased ms contin to 30mg  bid with good results -appropriate pacing/rest breaks etc   -increased lidoderm patches to 3 daily. 3. Mood: LCSW to follow for evaluation and support.  4. Neuropsych: This patient is capable of making decisions on his own behalf.  5. A Fib/TAVR 01/2014: Continue coumadin ---recheck labs today 6. H/O CVA: continue ASA for secondary stroke prevention.  7. NICM/Chronic combined systolic and diastolic CHF: Off diuretics and No ace due to low BP. Low salt diet.  -weight down to 84kg 8. Constipation: Continue miralax bid and Senna S. dulc suppository and SSE prn 9. Chronic fatigue with anorexia: supplements/ push po  -will ask RD to follow up  -has not tolerated megace in the past 10. Persistent nausea: improved  -zofran  Prn   -compazine scheduled    -diarrhea persistent yesterday  -check c diff today  given leukocytosis (also will check urine)  -probiotic  LOS (Days) 13 A FACE TO FACE EVALUATION WAS PERFORMED  Meredith Staggers 04/09/2014 7:51 AM

## 2014-04-09 NOTE — Progress Notes (Signed)
Occupational Therapy Session Note  Patient Details  Name: Lucas Green MRN: [id number] Date of Birth: 1932/11/17  Today's Date: 04/09/2014 Time: 0730-0830 Time Calculation (min): 60 min  Short Term Goals: Week 2:  OT Short Term Goal 1 (Week 2): Patient will complete lower body bathing supine or sitting and standing using AE, prn, with min assist OT Short Term Goal 2 (Week 2): Patient will complete toileting with min assist OT Short Term Goal 3 (Week 2): Patient will completed SPT from w/c to [facility] with min assist OT Short Term Goal 4 (Week 2): Patient will complete HEP to improved endurance and strength at bil UE with supervision  Skilled Therapeutic Interventions/Progress Updates: ADL-retraining with emphasis on improved performance with transfers, endurance, lower body bathing/dressing, and toileting.   Patient received supine in bed, alert and receptive for ADL.   With extra time patient completed bed mobility using bed rail and HOB elevated unassisted to rise from supine to sitting at edge of bed.   Patient rose to stand at [facility] with steadying assist and ambulated slowly to bathroom to transfer to [facility] over toilet.   Patient required assist to remove diaper but sat on BSC with only min guard assist and completed BM.   Patient stood to perform hygiene (pt =50% thorough) and then ambulated to tub bench in walk-in shower to bathe.   With setup and steadying assist while standing patient bathed 8/10 body parts to include buttocks this session.   Following bath, patient transferred to w/c and was escorted to bedside for assisted dressing.  Although fatigued from session, patient laced his pull up diaper through left foot, required assist for right foot, and stood with min assist while OT completed lower body dressing.   Patient continues to demo improvements with ADL but requires numerous rest breaks and is able to accomplish all tasks within 60 min session with overall moderate assistance.    Therapy  Documentation Precautions:  Precautions Precautions: Fall Precaution Comments: Notify physician for pulse less than 55 or greater than 120, respiratory rate less than 12 or greater than 25, temperature greater than 100.5 F, urinary output less than 30 mL/hr for four hours, systolic BP less than 90 or greater than 258, diastolic BP less than 60 or greater than 100.  Prostate CA with mets to pelvis; pain; AFIB and CHF; monitor vitals Restrictions Weight Bearing Restrictions: No Other Position/Activity Restrictions: PA states no activity restrictions; exercise to pt pain tolerance; may want to avoid resisted exercises or use of Nustep secondary to pain (pt reports use of bike and Nustep at cardiac rehab were very painful)  Pain: No/denies pain   ADL: ADL ADL Comments: see FIM  See FIM for current functional status  Therapy/Group: Individual Therapy  Salome Spotted 04/09/2014, 12:33 PM

## 2014-04-09 NOTE — Progress Notes (Signed)
Social Work  Discharge Note  The overall goal for the admission was met for:   Discharge location: Yes - known upon admit that plan would be for SNF  Length of Stay: Yes - 14 days  Discharge activity level: No - initial goals downgraded due to poor endurance - max -mod assist  Home/community participation: Yes  Services provided included: MD, RD, PT, OT, RN, TR, Pharmacy and SW  Financial Services: Medicare and Private Insurance: generic commercial  Follow-up services arranged: Other: SNF @ [facility] [place]  Comments (or additional information):  Patient/Family verbalized understanding of follow-up arrangements: Yes  Individual responsible for coordination of the follow-up plan: transport via [facility]  Confirmed correct DME delivered: NA    [employer]

## 2014-04-09 NOTE — Progress Notes (Signed)
Social Work Patient ID: Lucas Green, male   DOB: 1932-10-23, 80 y.o.   MRN: [id number]  Have received SNF bed offer for tomorrow from [place].  MD and tx team aware.  Pt and wife accepting offer and plan to transfer via ambulance.  Lennart Pall, LCSW

## 2014-04-09 NOTE — Progress Notes (Signed)
Social Work Patient ID: SKLYER BAGE, male   DOB: [birth year], 80 y.o.   MRN: [id number]   Lennart Pall, LCSW Social Worker Signed  Patient Care Conference Service date: 04/08/2014 2:31 PM  Inpatient RehabilitationTeam Conference and Plan of Care Update Date: 04/08/2014   Time: 2:00 PM     Patient Name: Lucas Green       Medical Record Number: [id number]   Date of Birth: 05-04-1932 Sex: Male         Room/Bed: 4W09C/4W09C-01 Payor Info: Payor: MEDICARE / Plan: MEDICARE PART A AND B / Product Type: *No Product type* /   Admitting Diagnosis: mestatic prostate cancer to pelvis   Admit Date/Time:  03/27/2014  6:55 PM Admission Comments: No comment available   Primary Diagnosis:  <principal problem not specified> Principal Problem: <principal problem not specified>    Patient Active Problem List     Diagnosis  Date Noted   .  Physical deconditioning  03/27/2014   .  Intractable pain  03/22/2014   .  Pathologic pelvic fracture  03/22/2014   .  S/P TAVR (transcatheter aortic valve replacement)  01/14/2014   .  Acute on chronic systolic and diastolic heart failure, NYHA class 4  01/07/2014   .  Severe aortic stenosis     .  PAF (paroxysmal atrial fibrillation)     .  Chronic diastolic congestive heart failure     .  Acute on chronic diastolic heart failure     .  Aortic valve disorders  01/01/2014   .  Pleural effusion, left  12/30/2013   .  Hypokalemia  12/30/2013   .  CAP (community acquired pneumonia)  12/30/2013   .  Nausea and vomiting  12/30/2013   .  Dyslipidemia  12/30/2013   .  Weakness  12/30/2013   .  Failure to thrive  12/30/2013   .  Moderate protein-calorie malnutrition  12/30/2013   .  Bone metastases  09/24/2013   .  Metastasis from malignant tumor of prostate     .  Renal insufficiency  04/02/2013   .  Hypertension  03/29/2013   .  H/O: CVA (cerebrovascular accident) April 2014  03/29/2013   .  Prostate cancer with bone mets  09/11/2009     Expected  Discharge Date: Expected Discharge Date:  (SNF)  Team Members Present: Physician leading conference: Dr. Alger Simons Social Worker Present: Lennart Pall, LCSW Nurse Present: Elliot Cousin, RN PT Present: Canary Brim, Cecille Rubin, PT OT Present: Salome Spotted, OT;Patricia Lissa Hoard, OT PPS Coordinator present : Daiva Nakayama, RN, CRRN;Becky Alwyn Ren, PT        Current Status/Progress  Goal  Weekly Team Focus   Medical     pain better. nausea decreased. had diarrhea  see prior  bowel mgt, pacing    Bowel/Bladder     Continent of bowel and bladder, per report. LBM 04/05/14,   Pt to remain continent of bowel and bladder.   Monitor   Swallow/Nutrition/ Hydration            ADL's     Setup seated UB ADL, Min-Max A LB ADL, min A transfers, Min-Mod A toileting  Overall Min A for ADL  Endurance, dynamic balance (sitting/standing), pain management, AE training, transfers   Mobility     min A  s/min A overall  participation, strengthening   Communication            Safety/Cognition/ Behavioral Observations  Pain     Lidoderm patch to R hip x 2, lowe back x 1. Scheduled MS Contin which pt is refusing at time.  <3  Assess for nonverbal cues of pain   Skin     Blister to R hip  No additional skin breakdown  Assess q shift    Rehab Goals Patient on target to meet rehab goals: Yes *See Care Plan and progress notes for long and short-term goals.    Barriers to Discharge:  pain, fatigue      Possible Resolutions to Barriers:    placement     Discharge Planning/Teaching Needs:    plan changed to SNF      Team Discussion:    Still with limited participation and endurance.  Focusing on sit to stands.  Nausea somewhat improved and now with incontinence.  Awaiting SNF bed   Revisions to Treatment Plan:    None    Continued Need for Acute Rehabilitation Level of Care: The patient requires daily medical management by a physician with specialized training in physical medicine and  rehabilitation for the following conditions: Daily direction of a multidisciplinary physical rehabilitation program to ensure safe treatment while eliciting the highest outcome that is of practical value to the patient.: Yes Daily medical management of patient stability for increased activity during participation in an intensive rehabilitation regime.: Yes Daily analysis of laboratory values and/or radiology reports with any subsequent need for medication adjustment of medical intervention for : Neurological problems;Post surgical problems;Other  Lennart Pall 04/08/2014, 2:32 PM         Lennart Pall, LCSW Social Worker Signed  Patient Care Conference Service date: 04/02/2014 11:29 AM  Inpatient RehabilitationTeam Conference and Plan of Care Update Date: 04/02/2014   Time: 11:34 AM     Patient Name: Lucas Green       Medical Record Number: [id number]   Date of Birth: November 20, 1932 Sex: Male         Room/Bed: 4W09C/4W09C-01 Payor Info: Payor: MEDICARE / Plan: MEDICARE PART A AND B / Product Type: *No Product type* /   Admitting Diagnosis: mestatic prostate cancer to pelvis   Admit Date/Time:  03/27/2014  6:55 PM Admission Comments: No comment available   Primary Diagnosis:  <principal problem not specified> Principal Problem: <principal problem not specified>    Patient Active Problem List     Diagnosis  Date Noted   .  Physical deconditioning  03/27/2014   .  Intractable pain  03/22/2014   .  Pathologic pelvic fracture  03/22/2014   .  S/P TAVR (transcatheter aortic valve replacement)  01/14/2014   .  Acute on chronic systolic and diastolic heart failure, NYHA class 4  01/07/2014   .  Severe aortic stenosis     .  PAF (paroxysmal atrial fibrillation)     .  Chronic diastolic congestive heart failure     .  Acute on chronic diastolic heart failure     .  Aortic valve disorders  01/01/2014   .  Pleural effusion, left  12/30/2013   .  Hypokalemia  12/30/2013   .  CAP (community  acquired pneumonia)  12/30/2013   .  Nausea and vomiting  12/30/2013   .  Dyslipidemia  12/30/2013   .  Weakness  12/30/2013   .  Failure to thrive  12/30/2013   .  Moderate protein-calorie malnutrition  12/30/2013   .  Bone metastases  09/24/2013   .  Metastasis from malignant tumor  of prostate     .  Renal insufficiency  04/02/2013   .  Hypertension  03/29/2013   .  H/O: CVA (cerebrovascular accident) April 2014  03/29/2013   .  Prostate cancer with bone mets  09/11/2009     Expected Discharge Date: Expected Discharge Date: 04/09/14 (vs. SNF)  Team Members Present: Physician leading conference: Dr. Alger Simons Social Worker Present: Lennart Pall, LCSW Nurse Present: Elliot Cousin, RN PT Present: Canary Brim, PT OT Present: Willeen Cass, OT SLP Present: Weston Anna, SLP PPS Coordinator present : Daiva Nakayama, RN, CRRN;Becky Alwyn Ren, PT        Current Status/Progress  Goal  Weekly Team Focus   Medical     prostate cancer with mets to pelvis. ;pain and stamina issues  increase activity tolerance  pain mgt, pacing and finding appropriate activity level   Bowel/Bladder     incontinent of bowel this morning  continent of both  toilet q 3-4 hours prn   Swallow/Nutrition/ Hydration            ADL's     Setup with seated UB B&D, Max A LB B&D, Max A SPT and toileting  Overall Min A for ADL  Endurance, dynamic balance (sitting/standing), pain management, AE training, transfers   Mobility     limited participation with therapies; overall max A  S/min A overall  endurance, functional strengthening, OOB tolerance, transfers, gait, standing balance/endurance, w/c mobility   Communication            Safety/Cognition/ Behavioral Observations           Pain     Pain to right hip and lower back. On scheduled ms contin and lidocaine patch to site.  pain less than 2  assess for pain and medicate when necessary.   Skin     Blister to right hip. foam dressing in place.  no new  skin breakdown.  assess skin q shift and prn    Rehab Goals Patient on target to meet rehab goals: No Rehab Goals Revised: slow progress and poor tolerance overall *See Care Plan and progress notes for long and short-term goals.    Barriers to Discharge:  activity tolerance and pain      Possible Resolutions to Barriers:    adjusting schedule, pacing      Discharge Planning/Teaching Needs:    home with wife to provide/ arrange 24/7 assist vs. SNF      Team Discussion:    New eval.  Some decrease in overall pain and nausea today.  Very poor endurance and need to decrease therapy schedule.  Anticipate minimal assist goals at best - concern about whether wife can/ will provide this.  SW to follow up and determine if plan to change to SNF   Revisions to Treatment Plan:    Change to 15/7 schedule.  Possible change in d/c plan.    Continued Need for Acute Rehabilitation Level of Care: The patient requires daily medical management by a physician with specialized training in physical medicine and rehabilitation for the following conditions: Daily direction of a multidisciplinary physical rehabilitation program to ensure safe treatment while eliciting the highest outcome that is of practical value to the patient.: Yes Daily medical management of patient stability for increased activity during participation in an intensive rehabilitation regime.: Yes Daily analysis of laboratory values and/or radiology reports with any subsequent need for medication adjustment of medical intervention for : Neurological problems;Post surgical problems  Lennart Pall 04/02/2014, 11:34  AM

## 2014-04-09 NOTE — Progress Notes (Signed)
Physical Therapy Session Note  Patient Details  Name: FILIPPO PULS MRN: [id number] Date of Birth: Dec 19, 1931  Today's Date: 04/09/2014  Skilled Therapeutic Interventions/Progress Updates:  Pt declined participation in 16min scheduled skilled physical therapy. Pt reported that he was not feeling well following episode of emesis, RN aware and no other needs expressed at this time. Pt semi-reclined in bed w/ all needs in reach and wife in room. Continue per current POC.   Therapy Documentation Precautions:  Precautions Precautions: Fall Precaution Comments: Notify physician for pulse less than 55 or greater than 120, respiratory rate less than 12 or greater than 25, temperature greater than 100.5 F, urinary output less than 30 mL/hr for four hours, systolic BP less than 90 or greater than 259, diastolic BP less than 60 or greater than 100.  Prostate CA with mets to pelvis; pain; AFIB and CHF; monitor vitals Restrictions Weight Bearing Restrictions: No Other Position/Activity Restrictions: PA states no activity restrictions; exercise to pt pain tolerance; may want to avoid resisted exercises or use of Nustep secondary to pain (pt reports use of bike and Nustep at cardiac rehab were very painful)  See FIM for current functional status  Therapy/Group: Individual Therapy  Gilmore Laroche 04/09/2014, 1:39 PM

## 2014-04-09 NOTE — Progress Notes (Signed)
Coumadin per pharmacy for h/o Atrial Fibrillation-on chronic coumadin PTA.  Assessment:  - 82 yoM w/ metastatic prostate CA admitted 4/11 w/ R hip pain. Pt on warfarin PTA for AFib. Transferred to [facility] 4/14 for XRT treatments. Warfarin per RX. INR 3.02 today, therapeutic INR.  INR has been in upper end of therapeutic range, thus lappears he needs lower dose than pta home dose of 6mg  QD. No bleeding noted.  On 4/28 CBC stable with H/H 10.4/31.7 and PLTC 442K.  GOAL: INR 2-3  PLAN:  1. Coumadin 2 mg x1 (may need b/w 2 to 3 mg daily of coumadin) 2. qAM INR. Monitor for bleeding complications.  3. Coumadin educated on 03/28/14  Nicole Cella, [place] Clinical Pharmacist Pager: [phone] 04/09/2014, 2:06 PM

## 2014-04-09 NOTE — Progress Notes (Signed)
Discharge Summary # [phone]

## 2014-04-10 LAB — URINE CULTURE
CULTURE: NO GROWTH
Colony Count: NO GROWTH

## 2014-04-10 LAB — PROTIME-INR
INR: 3.24 — ABNORMAL HIGH (ref 0.00–1.49)
Prothrombin Time: 31.9 seconds — ABNORMAL HIGH (ref 11.6–15.2)

## 2014-04-10 MED ORDER — GERITOL TONIC PO LIQD: 1.0000 | Freq: Every day | ORAL | Status: AC

## 2014-04-10 MED ORDER — BISACODYL 10 MG RE SUPP: 10.0000 mg | Freq: Every day | RECTAL | Status: AC | PRN

## 2014-04-10 MED ORDER — LIDOCAINE 5 % EX PTCH: 3.0000 | MEDICATED_PATCH | Freq: Every day | CUTANEOUS | Status: AC

## 2014-04-10 MED ORDER — HYDROMORPHONE HCL 2 MG PO TABS: 2.0000 mg | ORAL_TABLET | Freq: Three times a day (TID) | ORAL | Status: AC | PRN

## 2014-04-10 MED ORDER — MORPHINE SULFATE ER 30 MG PO TBCR: 30.0000 mg | EXTENDED_RELEASE_TABLET | Freq: Two times a day (BID) | ORAL | Status: AC

## 2014-04-10 MED ORDER — PROCHLORPERAZINE MALEATE 5 MG PO TABS: 5.0000 mg | ORAL_TABLET | Freq: Four times a day (QID) | ORAL | Status: AC

## 2014-04-10 MED ORDER — BOOST PLUS PO LIQD
237.0000 mL | Freq: Three times a day (TID) | ORAL | Status: AC
Start: 2014-04-10 — End: ?

## 2014-04-10 MED ORDER — WARFARIN SODIUM 1 MG PO TABS
1.0000 mg | ORAL_TABLET | Freq: Once | ORAL | Status: DC
  Filled 2014-04-10: qty 1

## 2014-04-10 MED ORDER — SACCHAROMYCES BOULARDII 250 MG PO CAPS: 250.0000 mg | ORAL_CAPSULE | Freq: Two times a day (BID) | ORAL | Status: AC

## 2014-04-10 MED ORDER — WARFARIN SODIUM 1 MG PO TABS: 1.0000 mg | ORAL_TABLET | Freq: Once | ORAL | Status: AC

## 2014-04-10 NOTE — Progress Notes (Signed)
[place] PHYSICAL MEDICINE & REHABILITATION     PROGRESS NOTE    Subjective/Complaints: Overall feeling better yesterday. No bm yesterday. Nausea better  A 12 point review of systems has been performed and if not noted above is otherwise negative.   Objective: Vital Signs: Blood pressure 127/69, pulse 80, temperature 97.3 F (36.3 C), temperature source Oral, resp. rate 18, height 6' 2.02" (1.88 m), weight 81.5 kg (179 lb 10.8 oz), SpO2 98.00%. No results found.  Recent Labs  04/08/14 1045  WBC 14.0*  HGB 10.4*  HCT 31.7*  PLT 442*    Recent Labs  04/08/14 1045  NA 131*  K 4.3  CL 93*  GLUCOSE 117*  BUN 18  CREATININE 0.97  CALCIUM 8.6   CBG (last 3)  No results found for this basename: GLUCAP,  in the last 72 hours  Wt Readings from Last 3 Encounters:  04/09/14 81.5 kg (179 lb 10.8 oz)  03/24/14 87.5 kg (192 lb 14.4 oz)  03/06/14 87.091 kg (192 lb)    Physical Exam:  Nursing note and vitals reviewed.  Constitutional: He is oriented to person, place, and time. He appears well-developed and well-nourished.  HENT:  Head: Normocephalic and atraumatic.  Eyes: Conjunctivae are normal. Pupils are equal, round, and reactive to light.  Neck: Normal range of motion. Neck supple.  Cardiovascular: Normal rate and regular rhythm. No murmurs, rubs, or gallops  No murmur heard.  Respiratory: Effort normal and breath sounds normal. No respiratory distress. He has no wheezes.  GI: Soft. Bowel sounds are normal.  Neurological: He is alert and oriented to person, place, and time. UES nearly 5/5. RLE 2- HF, 2+ KE and 4/5 ankle. LLE 2+ to 3 HF, 3+ KE and 4+ ankle. No sensory findings. CN exam normal. DTR's 1+. Cognitively appropriate Musc: right hip/groin tender to PROM and AROM.  Skin: Skin is warm and dry.  Psychiatric: His behavior is normal. Thought content normal. His mood appears anxious.    Assessment/Plan: 1. Functional deficits secondary to metastatic prostate  cancer to the pelvis which require 3+ hours per day of interdisciplinary therapy in a comprehensive inpatient rehab setting. Physiatrist is providing close team supervision and 24 hour management of active medical problems listed below. Physiatrist and rehab team continue to assess barriers to discharge/monitor patient progress toward functional and medical goals.  SNF placement today   FIM: FIM - Bathing Bathing Steps Patient Completed: Chest;Right Arm;Left Arm;Abdomen;Front perineal area;Buttocks;Right upper leg;Left upper leg Bathing: 4: Min-Patient completes 8-9 5f 10 parts or 75+ percent  FIM - Upper Body Dressing/Undressing Upper body dressing/undressing steps patient completed: Thread/unthread right sleeve of pullover shirt/dresss;Thread/unthread left sleeve of pullover shirt/dress;Put head through opening of pull over shirt/dress;Pull shirt over trunk Upper body dressing/undressing: 5: Set-up assist to: Obtain clothing/put away FIM - Lower Body Dressing/Undressing Lower body dressing/undressing steps patient completed: Thread/unthread right underwear leg;Thread/unthread left underwear leg Lower body dressing/undressing: 2: Max-Patient completed 25-49% of tasks  FIM - Toileting Toileting steps completed by patient: Performs perineal hygiene Toileting Assistive Devices: Grab bar or rail for support Toileting: 2: Max-Patient completed 1 of 3 steps  FIM - Radio producer Devices: Nurse, learning disability Transfers: 4-To toilet/BSC: Min A (steadying Pt. > 75%);4-From toilet/BSC: Min A (steadying Pt. > 75%)  FIM - Bed/Chair Transfer Bed/Chair Transfer Assistive Devices: Bed rails;HOB elevated;Walker Bed/Chair Transfer: 5: Supine > Sit: Supervision (verbal cues/safety issues);4: Bed > Chair or W/C: Min A (steadying Pt. > 75%);4: Chair or W/C >  Bed: Min A (steadying Pt. > 75%);5: Sit > Supine: Supervision (verbal cues/safety issues)  FIM - Locomotion:  Wheelchair Distance: Pt initially required total A for w/c to gym x 150'; when cued to begin w/c propulsion with UE back to room pt only able to perform 10' before reporting fatigue; returned to room with total A Locomotion: Wheelchair: 0: Activity did not occur FIM - Locomotion: Ambulation Locomotion: Ambulation Assistive Devices: Administrator Ambulation/Gait Assistance: 4: Min assist Locomotion: Ambulation: 0: Activity did not occur  Comprehension Comprehension Mode: Auditory Comprehension: 6-Follows complex conversation/direction: With extra time/assistive device  Expression Expression Mode: Verbal Expression: 5-Expresses complex 90% of the time/cues < 10% of the time  Social Interaction Social Interaction: 6-Interacts appropriately with others with medication or extra time (anti-anxiety, antidepressant).  Problem Solving Problem Solving: 5-Solves basic problems: With no assist  Memory Memory: 5-Requires cues to use assistive device  Medical Problem List and Plan:  Metastatic prostate cancer to the pelvis  1. DVT Prophylaxis/Anticoagulation: Pharmaceutical: Coumadin  2. Right hip pain/Pain Management: current adjustments/regimen proved effective so far  -continue po dilaudid prn -increased ms contin to 30mg  bid with good results -appropriate pacing/rest breaks etc   -increased lidoderm patches to 3 daily. 3. Mood: LCSW to follow for evaluation and support.  4. Neuropsych: This patient is capable of making decisions on his own behalf.  5. A Fib/TAVR 01/2014: Continue coumadin ---recheck labs today 6. H/O CVA: continue ASA for secondary stroke prevention.  7. NICM/Chronic combined systolic and diastolic CHF: Off diuretics and No ace due to low BP. Low salt diet.  -weight down to 84kg 8. Constipation: Continue miralax bid and Senna S. dulc suppository and SSE prn 9. Chronic fatigue with anorexia: supplements/ push po  -will ask RD to follow up  -has not tolerated megace  in the past 10. Persistent nausea: improved  -zofran  Prn   -compazine scheduled    -diarrhea persistent yesterday  -hold c diff lab as he didn't have a bm yesterday  -probiotic  LOS (Days) 14 A FACE TO FACE EVALUATION WAS PERFORMED  Meredith Staggers 04/10/2014 7:53 AM

## 2014-04-10 NOTE — Progress Notes (Signed)
Coumadin per pharmacy  - 33 yoM w/ metastatic prostate CA admitted 4/11 w/ R hip pain. Pt on warfarin PTA for AFib. Transferred to [facility] 4/14 for XRT treatments. Warfarin per RX. INR 3.24, supratherapeutic. INR had been in uppper end of therapeutic range, appears needs lower than pta dose of 6mg  QD. No bleeding noted. CBC stable  GOAL: INR 2-3  PLAN:  1. Coumadin 1 mg x1. Watch po intake, has refused some meals 2. qAM INR. Monitor for bleeding complications.  3. Coumadin educated on 03/28/14

## 2014-04-10 NOTE — Progress Notes (Signed)
Physical Therapy Discharge Summary  Patient Details  Name: ROBEL WUERTZ MRN: [id number] Date of Birth: [birth year]  Today's Date: 04/10/2014  Patient has met 5 of 6 long term goals due to improved activity tolerance, improved balance and increased strength.  Patient to discharge at transfers only level Min Assist.   Pt limited by poor participation throughout stay, unwilling to work with therapy to progress strength and activity tolerance and work toward goals of increased independence.  Pt to transfer to SNF for continued rehab.  Reasons goals not met: pt not participating in skilled PT  Recommendation:  Patient will benefit from ongoing skilled PT services in skilled nursing facility setting to continue to advance safe functional mobility, address ongoing impairments in strength, activity tolerance, gait, and minimize fall risk.  Equipment: No equipment provided  Reasons for discharge: discharge from hospital  Patient/family agrees with progress made and goals achieved: Yes  PT Discharge Precautions/Restrictions Precautions Precautions: Fall Restrictions Weight Bearing Restrictions: No  Cognition Overall Cognitive Status: Within Functional Limits for tasks assessed Arousal/Alertness: Awake/alert Sensation Sensation Light Touch: Appears Intact Proprioception: Appears Intact Coordination Gross Motor Movements are Fluid and Coordinated: Yes Motor  Motor Motor - Skilled Clinical Observations: generalized weakness  Mobility Bed Mobility Supine to Sit: 5: Supervision Transfers Sit to Stand: 4: Min assist Stand to Sit: 4: Min Gaffer Transfers: 4: Min Psychologist, occupational Details (indicate cue type and reason): min A with RW Locomotion  Ambulation Ambulation/Gait Assistance: 4: Min assist Ambulation Distance (Feet): 10 Feet  Trunk/Postural Assessment  Cervical Assessment Cervical Assessment: Within Functional Limits Thoracic Assessment Thoracic  Assessment:  (kyphosis) Lumbar Assessment Lumbar Assessment:  (posterior pelvic tilt) Postural Control Postural Control:  (posterior lean)  Balance Static Sitting Balance Static Sitting - Level of Assistance: 5: Stand by assistance Static Standing Balance Static Standing - Level of Assistance: 4: Min assist Dynamic Standing Balance Dynamic Standing - Level of Assistance: 4: Min assist Extremity Assessment      RLE Strength RLE Overall Strength Comments: grossly 3-/5 LLE Strength LLE Overall Strength Comments: grossly 3-/5  See FIM for current functional status  Kennith Gain 04/10/2014, 7:28 AM

## 2014-04-10 NOTE — Progress Notes (Signed)
Physical Medicine and Rehabilitation Consult  Reason for Consult: Pathologic fractures right pelvis.  Referring Physician: Dr. Charlies Silvers  HPI: Lucas Green is a 80 y.o. male is a 80 y.o. male with a history of Metastatic Prostate Cancer, Paroxsymal Atrial fibrillation on coumadin Rx, Chronic Diastolic CHF, HTN, and prior CVA who presents to the ED on 03/21/14 with reports of 2-3 week history of progressive right hip pain with inability to stand on RLE as well as intractable Right hip pain X 24 hours. X-rays of right Hip revealed progressive destructive bone changes of right anterior iliac spine with pathologic fractures involving the right superior and inferior pubic rami. He was started on narcotics for pain management and Radiation Onc consulted for input. He was evaluated by Dr. Lisbeth Renshaw and XRT recommended for pain management--last treatment on 03/27/14. PT evaluation done and rehab team recommending CIR.  ROS  Past Medical History   Diagnosis  Date   .  Hypertension    .  GERD (gastroesophageal reflux disease)    .  Dyslipidemia    .  Aortic stenosis    .  Prostate cancer  09/2009   .  Metastasis from malignant tumor of prostate      right iliac   .  Hearing loss    .  Incontinence of urine    .  Male impotence    .  Degenerative arthritis      osteoarthritis   .  Stroke  03/29/13   .  Hx of radiation therapy  09/30/13- 10/11/13     right hip/ischium, 3000 cGy 10 sessions   .  Esophageal stricture    .  Hiatal hernia    .  Diverticulosis    .  Moderate protein-calorie malnutrition  12/30/2013   .  Failure to thrive  12/30/2013   .  Pleural effusion, left  12/30/2013   .  Severe aortic stenosis    .  Atrial fibrillation    .  Chronic diastolic congestive heart failure    .  Acute on chronic diastolic heart failure    .  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  Tonsillectomy   1938   .  Cryosurgery prostate   09/2009     prostate cancer, Gleason 7   .  Intraoperative transesophageal echocardiogram  N/A  01/14/2014     Procedure: INTRAOPERATIVE TRANSESOPHAGEAL ECHOCARDIOGRAM; Surgeon: Rexene Alberts, MD; Location: [facility]; Service: Open Heart Surgery; Laterality: N/A;   .  Transcatheter aortic valve replacement, transaortic  N/A  01/14/2014     Procedure: TRANSCATHETER AORTIC VALVE REPLACEMENT, TRANSAORTIC; Surgeon: Rexene Alberts, MD; Location: [facility]; Service: Open Heart Surgery; Laterality: N/A;   .  Chest tube insertion  Left  01/14/2014     Procedure: CHEST TUBE INSERTION; Surgeon: Rexene Alberts, MD; Location: [facility]; Service: Open Heart Surgery; Laterality: Left;    Family History   Problem  Relation  Age of Onset   .  Heart disease  Father    .  Cancer  Father      prostate   .  Heart attack  Father    .  Cancer - Colon  Mother    .  Thyroid disease  Mother    .  Stroke  Maternal Grandfather    .  Hypertension  Maternal Grandfather    .  Stroke  Paternal Grandfather    .  Hypertension  Paternal Grandfather    .  Cancer - Lung  Cousin     Social History: Married. Independent with AD. Wife works days. Per reports he has never smoked. He has never used smokeless tobacco. Per reports that he drinks alcohol. He reports that he does not use illicit drugs.  Allergies   Allergen  Reactions   .  Other      Beer and Wine- swelling/headache/nausea    Medications Prior to Admission   Medication  Sig  Dispense  Refill   .  aspirin 81 MG chewable tablet  Chew 1 tablet (81 mg total) by mouth daily.     Marland Kitchen  ibuprofen (ADVIL,MOTRIN) 800 MG tablet  Take 800 mg by mouth every 8 (eight) hours as needed for pain.     .  Iron-Vitamins (GERITOL) LIQD  Take 1 Container by mouth daily.     .  metoprolol succinate (TOPROL-XL) 50 MG 24 hr tablet  Take 50 mg by mouth daily. Take  with or immediately following a meal.     .  Multiple Vitamin (MULTIVITAMIN WITH MINERALS) TABS  Take 1 tablet by mouth daily.     .  pantoprazole (PROTONIX) 40 MG tablet  Take 40 mg by mouth daily.     .  simvastatin (ZOCOR) 20 MG tablet  Take 20 mg by mouth daily.     Marland Kitchen  warfarin (COUMADIN) 6 MG tablet  Take 6 mg by mouth daily.      Home:  Home Living  Family/patient expects to be discharged to:: Private residence  Living Arrangements: Spouse/significant other  Available Help at Discharge: Family (wife works M-F 8 hour days)  Type of Home: House  Home Access: Stairs to enter  [employer] of Steps: 1  Home Layout: Multi-level;Able to live on main level with bedroom/bathroom  Home Equipment: Gilford Rile - 2 wheels;Cane - single point;Shower seat  Functional History:  Prior Function  Level of Independence: Independent with assistive device(s)  Comments: using cane and RW for ambulation  Functional Status:  Mobility:  Bed Mobility  Overal bed mobility: Needs Assistance  Bed Mobility: Supine to Sit  Supine to sit: Mod assist  General bed mobility comments: Cues for technique, physical assist to ease bil LEs to EOB, and to elevate trunk from bed  Transfers  Overall transfer level: Needs assistance  Equipment used: Rolling walker (2 wheeled)  Transfers: Sit to/from Stand  Sit to Stand: Mod assist;+2 safety/equipment  General transfer comment: Cues fro hand placement and technique; Noted pt tending to bear down into RW to unweigh painful RLE; needing physical assist to rise, and to steady RW  Ambulation/Gait  Ambulation/Gait assistance: Min assist  Ambulation Distance (Feet): 2 Feet (pivot steps bed to chair)  Assistive device: Rolling walker (2 wheeled)  General Gait Details: short, shuffling steps; required physical assist to maneuver RW   ADL:   Cognition:  Cognition  Overall Cognitive Status: Within Functional Limits for tasks assessed  Cognition  Arousal/Alertness:  Awake/alert  Behavior During Therapy: WFL for tasks assessed/performed  Overall Cognitive Status: Within Functional Limits for tasks assessed  Blood pressure 126/59, pulse 88, temperature 97.7 F (36.5 C), temperature source Oral, resp. rate 16, height 6\' 2"  (1.88 m), weight 87.5 kg (192 lb 14.4 oz), SpO2 97.00%.  Physical Exam  Results for orders  placed during the hospital encounter of 03/21/14 (from the past 24 hour(s))   PROTIME-INR Status: Abnormal    Collection Time    03/25/14 3:55 AM   Result  Value  Ref Range    Prothrombin Time  22.2 (*)  11.6 - 15.2 seconds    INR  2.02 (*)  0.00 - 1.49    No results found.  Assessment/Plan:  Diagnosis: metastatic prostate cancer to the pelvis  1. Does the need for close, 24 hr/day medical supervision in concert with the patient's rehab needs make it unreasonable for this patient to be served in a less intensive setting? Yes 2. Co-Morbidities requiring supervision/potential complications: PAF, hx of CVA 2014, AVD, severe pain 3. Due to bladder management, bowel management, safety, skin/wound care, disease management, pain management and patient education, does the patient require 24 hr/day rehab nursing? Yes 4. Does the patient require coordinated care of a physician, rehab nurse, PT (1-2 hrs/day, 5 days/week) and OT (1-2 hrs/day, 5 days/week) to address physical and functional deficits in the context of the above medical diagnosis(es)? Yes Addressing deficits in the following areas: balance, endurance, locomotion, strength, transferring, bowel/bladder control, bathing, dressing, grooming, toileting and psychosocial support 5. Can the patient actively participate in an intensive therapy program of at least 3 hrs of therapy per day at least 5 days per week? Yes 6. The potential for patient to make measurable gains while on inpatient rehab is excellent 7. Anticipated functional outcomes upon discharge from inpatient rehab are modified independent and  supervision with PT, modified independent and supervision with OT, n/a with SLP. 8. Estimated rehab length of stay to reach the above functional goals is: 7 days 9. Does the patient have adequate social supports to accommodate these discharge functional goals? Yes 10. Anticipated D/C setting: Home 11. Anticipated post D/C treatments: [facility] therapy 12. Overall Rehab/Functional Prognosis: excellent RECOMMENDATIONS:  This patient's condition is appropriate for continued rehabilitative care in the following setting: CIR  Patient has agreed to participate in recommended program. Yes and Potentially  Note that insurance prior authorization may be required for reimbursement for recommended care.  Comment: Rehab Admissions Coordinator to follow up.  Thanks,  Meredith Staggers, MD, Mellody Drown  03/25/2014  Revision History...      Date/Time User Action    03/25/2014 1:51 PM Meredith Staggers, MD Sign    03/25/2014 12:08 PM Bary Leriche, PA-C Share   View Details Report    Routing History.Marland KitchenMarland Kitchen

## 2014-04-10 NOTE — Progress Notes (Signed)
Social Work Discharge Note Discharge Note  The overall goal for the admission was met for:   Discharge location: No-[facility] [place]-SNF  Length of Stay: NO-14 DAYS Discharge activity level: No-MOD ASSIST  Home/community participation: Yes  Services provided included: MD, RD, PT, OT, RN, CM, TR, Pharmacy, Neuropsych and SW  Financial Services: Medicare and Private Insurance: [employer]  Follow-up services arranged: Other: NHP  Comments (or additional information):PT NOT AT [facility]  Patient/Family verbalized understanding of follow-up arrangements: Yes  Individual responsible for coordination of the follow-up plan: [name]-WIFE  Confirmed correct DME delivered: Elease Hashimoto 04/10/2014    Gardiner Rhyme Nyron Mozer

## 2014-04-10 NOTE — Progress Notes (Signed)
Patient discharged via [facility] and transported to [place] home, report called to nurse receiving patient. Patient given discharge information by Hadassah Pais and all questions answered.

## 2014-04-11 NOTE — Progress Notes (Signed)
Occupational Therapy Discharge Summary  Patient Details  Name: Lucas Green MRN: [id number] Date of Birth: 31-Aug-1932  Today's Date: 04/11/2014  Patient has met 8 of 9 long term goals due to improved activity tolerance, improved balance, ability to compensate for deficits and improved awareness.  Patient to discharge at [facility] Assist level.  Patient's care partner is unavailable to provide the necessary physical assistance at discharge.    Reasons goals not met: Patient unable to complete all ADL within time limitations due to fatigue and was unable to tolerate multiple sessions of therapy during admission due to chronic diarrhea/nausea.   Patient discharged with capacity to complete lower body dressing with inconsistent levels of assistance required (from mod - total).  Recommendation:  Patient will benefit from ongoing skilled OT services in skilled nursing facility setting to continue to advance functional skills in the area of BADL.  Equipment: No equipment provided  Reasons for discharge: discharge from hospital  Patient/family agrees with progress made and goals achieved: Yes  OT Discharge Precautions/Restrictions  Precautions Precautions: Fall Restrictions Weight Bearing Restrictions: No General   Vital Signs   Pain Pain Assessment Pain Assessment: No/denies pain ADL ADL ADL Comments: see FIM Vision/Perception  Vision- History Baseline Vision/History: Wears glasses Wears Glasses: Reading only Patient Visual Report: No change from baseline Vision- Assessment Vision Assessment?: No apparent visual deficits  Cognition Overall Cognitive Status: Within Functional Limits for tasks assessed Arousal/Alertness: Awake/alert Orientation Level: Oriented X4 Attention: Alternating Sustained Attention: Appears intact Sustained Attention Impairment: Functional complex Selective Attention: Appears intact Alternating Attention: Appears intact Memory: Appears  intact Awareness: Appears intact Problem Solving: Appears intact Safety/Judgment: Appears intact Sensation Sensation Light Touch: Appears Intact Stereognosis: Appears Intact Hot/Cold: Appears Intact Proprioception: Appears Intact Coordination Gross Motor Movements are Fluid and Coordinated: Yes Fine Motor Movements are Fluid and Coordinated: Yes Motor  Motor Motor - Skilled Clinical Observations: generalized weakness Mobility  Transfers Sit to Stand: 4: Min assist Sit to Stand Details: Tactile cues for posture Stand to Sit: 4: Min assist Stand to Sit Details (indicate cue type and reason): Tactile cues for posture  Trunk/Postural Assessment  Cervical Assessment Cervical Assessment: Within Functional Limits Thoracic Assessment Thoracic Assessment: Within Functional Limits Lumbar Assessment Lumbar Assessment: Within Functional Limits Postural Control Postural Control: Deficits on evaluation Postural Limitations: intermittent posterior lean  Balance Static Sitting Balance Static Sitting - Level of Assistance: 5: Stand by assistance Static Standing Balance Static Standing - Level of Assistance: 4: Min assist Dynamic Standing Balance Dynamic Standing - Level of Assistance: 4: Min assist Extremity/Trunk Assessment RUE Assessment RUE Assessment: Exceptions to [facility] [place] RUE AROM (degrees) Right Shoulder Flexion: 75 Degrees RUE Strength RUE Overall Strength: Due to premorbid status LUE Assessment LUE Assessment: Within Functional Limits  See FIM for current functional status  Salome Spotted 04/11/2014, 7:28 AM

## 2014-04-15 ENCOUNTER — Encounter: Payer: Self-pay | Admitting: *Deleted

## 2014-04-22 ENCOUNTER — Other Ambulatory Visit: Payer: Self-pay | Admitting: *Deleted

## 2014-04-22 ENCOUNTER — Ambulatory Visit: Payer: Medicare Other | Admitting: Radiation Oncology

## 2014-04-23 ENCOUNTER — Telehealth: Payer: Self-pay | Admitting: Oncology

## 2014-04-23 NOTE — Telephone Encounter (Signed)
lvm fo rpt regarding to June appt....mailed pt appt sched and letter °

## 2014-04-28 ENCOUNTER — Telehealth: Payer: Self-pay | Admitting: Medical Oncology

## 2014-04-28 ENCOUNTER — Ambulatory Visit: Payer: Medicare Other | Admitting: Cardiology

## 2014-04-28 NOTE — Telephone Encounter (Signed)
Spouse, Eissa Buchberger (she is on pt's HIPPA for authorization for release of information), called and states patient is @ nursing facility currently receiving rehabilitation for "multiple fractures of pelvis" and the facility is looking to discharge him to home this Wednesday. Wife states patient is "not capable to be at home" and asking to speak to Dr. Alen Blew regarding prognosis for patient. Depending on prognosis "considering transferring him to hospice."   Informed spouse MD out of office today. Message inboxed to MD for review.   LOV: 11/08/2013 Sched appt: lab/MD 05/15/2014

## 2014-04-28 NOTE — Telephone Encounter (Signed)
Return call to wife informing her of MD's reply. Mrs. Arnesen states she spoke with [facility] and wishes for patient to be referred to them.  Referral placed to [facility], per MD and per spouse's request. Spoke with Juliann Pulse and faxed requested documents to her attention @ [phone]. Dr Alen Blew to be attending with [facility]osp[facility] physicians to do symptom management.

## 2014-04-28 NOTE — Telephone Encounter (Signed)
I think it is very appropriate to consider hospice for him. He has an incurable cancer and less likely will get any further treatment.

## 2014-04-29 ENCOUNTER — Telehealth: Payer: Self-pay | Admitting: Medical Oncology

## 2014-04-29 ENCOUNTER — Encounter: Payer: Self-pay | Admitting: Oncology

## 2014-04-29 NOTE — Telephone Encounter (Signed)
Dr Rolanda Jay, patient, called asking to speak with Dr. Alen Blew regarding confirming his diagnosis. Informed pt will give mssg to MD.  Call back number [phone]  Mssg placed in MD's inbox.

## 2014-04-29 NOTE — Progress Notes (Signed)
Events related to this patient were noted today. Patient had been discharged to a skilled nursing facility and have been doing poorly at this time. He is not able to participate in physical therapy and is getting weaker and getting debilitated. He was set to be discharged from the skilled nursing facility but really not suitable for home care. The family was interested in hospice and I felt that is a reasonable option.  I discussed this over the phone today with Dr. 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 I feel given his debilitation it would be less likely for him to receive any further therapy at this time. His performance status is very poor and he is currently bed ridden. I feel that he has very limited life expectancy at this time and this was conveyed to him over the phone.  From a disposition standpoint, I discussed his case with the hospice liaison and felt a residential hospice would be an option but only for comfort care. I discussed this with the patient as well and he understands that this might be his best option at this time. I have explained to him as well that if he thrives and recovers and certainly we can revisit his prognosis in the future. As for his future followup at the [facility], I don't know that we would keep those appointments for him if he recovers and thrives. If he doesn't continue to decline then doses that will not be necessary.  All his questions were answered today to his satisfaction.

## 2014-05-01 ENCOUNTER — Ambulatory Visit: Payer: Medicare Other | Admitting: Cardiology

## 2014-05-01 ENCOUNTER — Telehealth: Payer: Self-pay | Admitting: Medical Oncology

## 2014-05-01 NOTE — Telephone Encounter (Signed)
Call from Dr Hartley Barefoot requesting office notes to be faxed to his office. Faxed notes to [phone] as requested.  MD informed.

## 2014-05-02 ENCOUNTER — Encounter: Payer: Medicare Other | Admitting: Cardiology

## 2014-05-02 NOTE — Progress Notes (Signed)
HPI: FU aortic stenosis s/p AVR, metastatic prostate CA, HTN, HL, prior stroke. He was admitted 1/14 with FTT and combined systolic and diastolic CHF in the setting of paroxysmal atrial fibrillation and severe aortic stenosis. Echocardiogram demonstrated severe aortic stenosis with a mean gradient of 43. He was ultimately evaluated by Dr. Burt Knack and felt to be a good candidate for TAVR. He underwent TAVR with Drs. Noemi Chapel, Bartle and Williams on 01/14/14 via a transaortic approach. Patient maintained NSR. He was placed on Coumadin given his history of prior CVA and atrial fibrillation.  Studies:  - LHC (01/06/14): Ostial left main 20-30 and 30, OM1 30.  - Echo (01/01/14): EF 62-95%, grade 2 diastolic dysfunction, severe aortic stenosis (Mean gradient 43 mm of mercury), mild LAE, PASP 45.  - Echo (02/18/12): Mild LVH, EF 35-40%, diffuse HK, grade 1 diastolic dysfunction, TAVR okay (mean gradient 10 mm of mercury), MAC, mild MR, mod LAE, trivial effusion  - Carotid US (03/30/13): No significant ICA stenosis  Patient suffered a pelvis fracture in April of 2014. Since last seen,     Current Outpatient Prescriptions  Medication Sig Dispense Refill  . alum & mag hydroxide-simeth (MAALOX/MYLANTA) 200-200-20 MG/5ML suspension Take 30 mLs by mouth every 6 (six) hours as needed for indigestion or heartburn (dyspepsia).  355 mL  0  . aspirin 81 MG chewable tablet Chew 1 tablet (81 mg total) by mouth daily.      . bisacodyl (DULCOLAX) 10 MG suppository Place 1 suppository (10 mg total) rectally daily as needed for moderate constipation.  12 suppository  0  . geriatric multivitamins-minerals (ELDERTONIC/GEVRABON) ELIX Take 15 mLs by mouth daily.  150 mL  0  . HYDROmorphone (DILAUDID) 2 MG tablet Take 1-2 tablets (2-4 mg total) by mouth every 8 (eight) hours as needed for severe pain.  10 tablet  0  . Iron-Vitamins (GERITOL) LIQD Take 1 Container by mouth daily.    0  . lactose free nutrition (BOOST  PLUS) LIQD Take 237 mLs by mouth 3 (three) times daily with meals.    0  . lidocaine (LIDODERM) 5 % Place 3 patches onto the skin daily. Remove & Discard patch within 12 hours or as directed by MD  30 patch  0  . metoprolol succinate (TOPROL-XL) 50 MG 24 hr tablet Take 50 mg by mouth daily. Take with or immediately following a meal.      . morphine (MS CONTIN) 30 MG 12 hr tablet Take 1 tablet (30 mg total) by mouth every 12 (twelve) hours.  14 tablet  0  . Multiple Vitamin (MULTIVITAMIN WITH MINERALS) TABS Take 1 tablet by mouth daily.      . pantoprazole (PROTONIX) 40 MG tablet Take 40 mg by mouth daily.      . prochlorperazine (COMPAZINE) 5 MG tablet Take 1 tablet (5 mg total) by mouth 4 (four) times daily.  30 tablet  0  . saccharomyces boulardii (FLORASTOR) 250 MG capsule Take 1 capsule (250 mg total) by mouth 2 (two) times daily.      . simvastatin (ZOCOR) 20 MG tablet Take 20 mg by mouth at bedtime.       Marland Kitchen warfarin (COUMADIN) 1 MG tablet Take 1 tablet (1 mg total) by mouth one time only at 6 PM.       No current facility-administered medications for this visit.     Past Medical History  Diagnosis Date  . Hypertension   . GERD (gastroesophageal reflux  disease)   . Dyslipidemia   . Aortic stenosis   . Prostate cancer 09/2009  . 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 Hearing loss   . Incontinence of urine   . 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 Degenerative arthritis     osteoarthritis  . Stroke 03/29/13  . Hx of radiation therapy 09/30/13- 10/11/13    right hip/ischium, 3000 cGy 10 sessions  . Esophageal stricture   . Hiatal hernia   . Diverticulosis   . Moderate protein-calorie malnutrition 12/30/2013  . Failure to thrive 12/30/2013  . Pleural effusion, left 12/30/2013  . Severe aortic stenosis   . Atrial fibrillation   . Chronic diastolic congestive heart failure   . Acute on chronic diastolic heart failure   . S/P TAVR (transcatheter aortic valve replacement)  01/14/2014    26 mm Edwards Sapien XT transcatheter heart valve placed via direct aortic approach using right anterior mini-thoracotomy   . Hx of radiation therapy 03/25/14- 03/27/14    right hip/pelvis, 15 MV photons parallel opposed anterior and posterior fields    Past Surgical History  Procedure Laterality Date  . Tonsillectomy  1938  . Cryosurgery prostate  09/2009    prostate cancer, Gleason 7  . Intraoperative transesophageal echocardiogram N/A 01/14/2014    Procedure: INTRAOPERATIVE TRANSESOPHAGEAL ECHOCARDIOGRAM;  Surgeon: Rexene Alberts, MD;  Location: [facility];  Service: Open Heart Surgery;  Laterality: N/A;  . Transcatheter aortic valve replacement, transaortic N/A 01/14/2014    Procedure: TRANSCATHETER AORTIC VALVE REPLACEMENT, TRANSAORTIC;  Surgeon: Rexene Alberts, MD;  Location: [facility];  Service: Open Heart Surgery;  Laterality: N/A;  . Chest tube insertion Left 01/14/2014    Procedure: CHEST TUBE INSERTION;  Surgeon: Rexene Alberts, MD;  Location: [facility];  Service: Open Heart Surgery;  Laterality: Left;    History   Social History  . Marital Status: Married    Spouse Name: N/A    Number of Children: 1  . Years of Education: MD   Occupational History  . Not on file.   Social History Main Topics  . Smoking status: Never Smoker   . Smokeless tobacco: Never Used  . Alcohol Use: Yes     Comment: occassionally  . Drug Use: No  . Sexual Activity: Not on file   Other Topics Concern  . Not on file   Social History Narrative  . No narrative on file    ROS: no fevers or chills, productive cough, hemoptysis, dysphasia, odynophagia, melena, hematochezia, dysuria, hematuria, rash, seizure activity, orthopnea, PND, pedal edema, claudication. Remaining systems are negative.  Physical Exam: Well-developed well-nourished in no acute distress.  Skin is warm and dry.  HEENT is normal.  Neck is supple.  Chest is clear to auscultation with normal expansion.  Cardiovascular exam is  regular rate and rhythm.  Abdominal exam nontender or distended. No masses palpated. Extremities show no edema. neuro grossly intact  ECG     This encounter was created in error - please disregard.

## 2014-05-13 ENCOUNTER — Ambulatory Visit: Payer: Medicare Other | Attending: Radiation Oncology | Admitting: Radiation Oncology

## 2014-05-15 ENCOUNTER — Ambulatory Visit: Payer: Medicare Other | Admitting: Oncology

## 2014-05-15 ENCOUNTER — Other Ambulatory Visit: Payer: Medicare Other

## 2014-06-16 ENCOUNTER — Telehealth: Payer: Self-pay | Admitting: *Deleted

## 2014-06-16 NOTE — Telephone Encounter (Signed)
Received call from [facility], social worker @ [facility] informed Dr. Alen Blew re:  Pt passed away on Friday 06-17-14 at  5:24 am. [facility]  Phone     [phone].

## 2014-07-12 DEATH — deceased

## 2014-07-25 IMAGING — CT CT HEART MORP W/ CTA COR W/ SCORE W/ CA W/CM &/OR W/O CM
1 of 12 series · 1 of 20 positions shown, 2 images · IV contrast (CONTRAST)
Comparison: No priors.

CLINICAL DATA: Severe aortic stenosis

EXAM:
Cardiac TAVR CT
TECHNIQUE: The patient was scanned on a Philips 256 scanner. A 120 kV
retrospective scan was triggered in the descending thoracic aorta at
111 HU's. Gantry rotation speed was 270 msecs and collimation was .9
mm. No beta blockade or nitro were given. The 3D data set was
reconstructed in 5% intervals of the R-R cycle. Systolic and
diastolic phases were analyzed on a dedicated work station using
MPR, MIP and VRT modes. The patient received 80 cc of contrast.

[Series 2: locator · axial · 0.49mm/px · z∈[-163,-163]mm · 1 of 1 slices shown, 2 images]
[im 1/1  vessel]
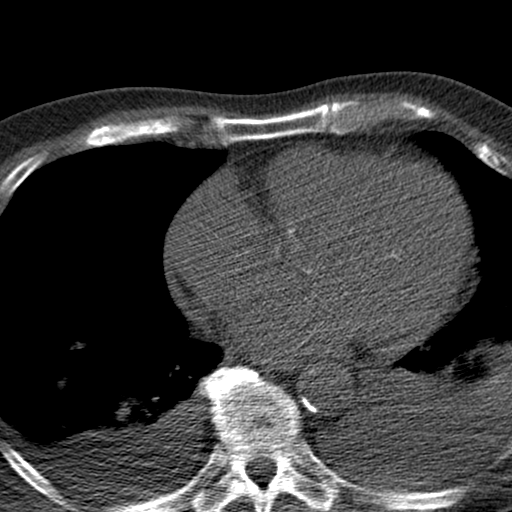
[im 1/1  lung]
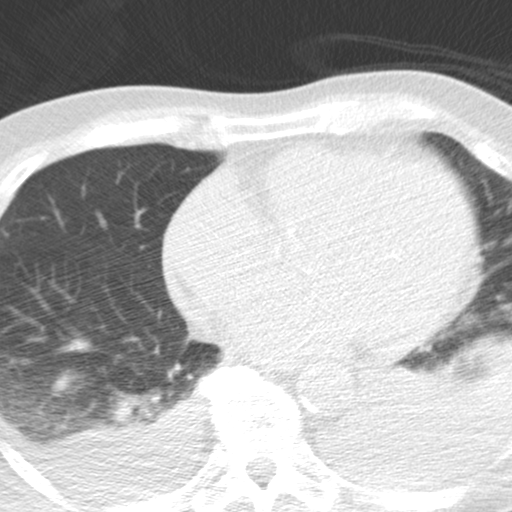

[1 of 20 positions shown; findings below may reference images not displayed]

FINDINGS: Aortic Valve: Thickened and calcified aortic valve, predominantly of
the left and non-coronary leaflet. Moderately to severely limited
leaflet opening. No calcifications in the LVOT bellow the annulus.

Aorta: Normal size, Ascending aorta is free of calcifications, there
are mild calcifications around the origin of brachiocephalic
arteries non-obstructing flow.

Sinotubular Junction:  26 x 25 mm

Ascending Thoracic Aorta:  32 x 32 mm

Aortic Arch:  25 x 24 mm

Descending Thoracic Aorta:  24 x 24 mm

Sinus of Valsalva Measurements:

Non-coronary:  30 mm

Right -coronary:  30 mm

Left -coronary:  32 mm

Coronary Artery Height above Annulus:

Left Main:  13 mm

Right Coronary:  15 mm

Virtual Basal Annulus Measurements:

Maximum/Minimum Diameter:  26 x 23 mm

Perimeter:  86 mm

Area:  494 mm2

Optimum Fluoroscopic Angle for Delivery:  LAO 6 DAUPHINE 6
IMPRESSION: 1. Severely thickened and calcified aortic stenosis with limited
leaflet excursions and measurements suitable for 26 mm
Edwards-SAPIEN valve.

2.  Optimal deployment angle LAO 6 DAUPHINE 6.

3. Sufficient distance from annulus to both coronary arteries for 26
mm valve.

Kaiyala Scheun

EXAM:
OVER-READ INTERPRETATION  CT CHEST

The following report is an over-read performed by radiologist Dr.
over-read does not include interpretation of cardiac or coronary
anatomy or pathology. The interpretation by the cardiologist is
attached.
FINDINGS: Small right and moderate left-sided pleural effusions with
associated passive atelectasis in the lower lobes of the lungs
bilaterally. No definite acute consolidative airspace disease.
Visualized portions of the upper abdomen are unremarkable. Sclerotic
lesion with healing pathologic fracture in the lateral aspect of the
left seventh rib.
IMPRESSION: 1. Small right and moderate left-sided pleural effusions.
2. Passive atelectasis in the lower lobes of the lungs bilaterally.
3. Healing pathologic fracture of the lateral aspect of the left
sixth rib where there is presumably a metastatic lesion in this
patient with history of metastatic prostate cancer.

## 2014-09-26 ENCOUNTER — Other Ambulatory Visit: Payer: Self-pay

## 2014-11-20 ENCOUNTER — Encounter (HOSPITAL_COMMUNITY): Payer: Self-pay | Admitting: Cardiovascular Disease

## 2014-12-22 ENCOUNTER — Other Ambulatory Visit: Payer: Self-pay | Admitting: *Deleted

## 2014-12-22 DIAGNOSIS — I350 Nonrheumatic aortic (valve) stenosis: Secondary | ICD-10-CM

## 2015-01-12 ENCOUNTER — Ambulatory Visit: Payer: Medicare Other | Admitting: Thoracic Surgery (Cardiothoracic Vascular Surgery)

## 2015-01-12 ENCOUNTER — Other Ambulatory Visit (HOSPITAL_COMMUNITY): Payer: Medicare Other
